# Patient Record
Sex: Female | Born: 1950
Health system: Southern US, Community
[De-identification: ages and names within clinical notes are randomized; demographics above are authoritative.]

## PROBLEM LIST (undated history)

## (undated) DIAGNOSIS — E669 Obesity, unspecified: Secondary | ICD-10-CM

## (undated) DIAGNOSIS — G709 Myoneural disorder, unspecified: Secondary | ICD-10-CM

## (undated) DIAGNOSIS — M1990 Unspecified osteoarthritis, unspecified site: Secondary | ICD-10-CM

## (undated) DIAGNOSIS — G459 Transient cerebral ischemic attack, unspecified: Secondary | ICD-10-CM

## (undated) DIAGNOSIS — E785 Hyperlipidemia, unspecified: Secondary | ICD-10-CM

## (undated) DIAGNOSIS — G43909 Migraine, unspecified, not intractable, without status migrainosus: Secondary | ICD-10-CM

## (undated) DIAGNOSIS — F419 Anxiety disorder, unspecified: Secondary | ICD-10-CM

## (undated) DIAGNOSIS — T7840XA Allergy, unspecified, initial encounter: Secondary | ICD-10-CM

## (undated) DIAGNOSIS — K5790 Diverticulosis of intestine, part unspecified, without perforation or abscess without bleeding: Secondary | ICD-10-CM

## (undated) DIAGNOSIS — M810 Age-related osteoporosis without current pathological fracture: Secondary | ICD-10-CM

## (undated) DIAGNOSIS — G4710 Hypersomnia, unspecified: Secondary | ICD-10-CM

## (undated) DIAGNOSIS — G35 Multiple sclerosis: Secondary | ICD-10-CM

## (undated) DIAGNOSIS — K5792 Diverticulitis of intestine, part unspecified, without perforation or abscess without bleeding: Secondary | ICD-10-CM

## (undated) HISTORY — PX: ABDOMINAL HYSTERECTOMY: SHX81

## (undated) HISTORY — DX: Diverticulosis of intestine, part unspecified, without perforation or abscess without bleeding: K57.90

## (undated) HISTORY — DX: Obesity, unspecified: E66.9

## (undated) HISTORY — DX: Diverticulitis of intestine, part unspecified, without perforation or abscess without bleeding: K57.92

## (undated) HISTORY — PX: OTHER SURGICAL HISTORY: SHX169

## (undated) HISTORY — PX: FRACTURE SURGERY: SHX138

## (undated) HISTORY — DX: Hyperlipidemia, unspecified: E78.5

## (undated) HISTORY — DX: Hypersomnia, unspecified: G47.10

## (undated) HISTORY — PX: TUBAL LIGATION: SHX77

## (undated) HISTORY — PX: WRIST SURGERY: SHX841

## (undated) HISTORY — DX: Unspecified osteoarthritis, unspecified site: M19.90

## (undated) HISTORY — DX: Allergy, unspecified, initial encounter: T78.40XA

## (undated) HISTORY — DX: Anxiety disorder, unspecified: F41.9

## (undated) HISTORY — DX: Age-related osteoporosis without current pathological fracture: M81.0

## (undated) HISTORY — DX: Myoneural disorder, unspecified: G70.9

## (undated) HISTORY — DX: Transient cerebral ischemic attack, unspecified: G45.9

## (undated) HISTORY — DX: Migraine, unspecified, not intractable, without status migrainosus: G43.909

---

## 1997-08-02 ENCOUNTER — Ambulatory Visit (HOSPITAL_COMMUNITY): Admission: RE | Admit: 1997-08-02 | Discharge: 1997-08-02 | Payer: Self-pay | Admitting: Family Medicine

## 1999-06-10 ENCOUNTER — Ambulatory Visit (HOSPITAL_COMMUNITY): Admission: RE | Admit: 1999-06-10 | Discharge: 1999-06-10 | Payer: Self-pay | Admitting: Neurology

## 1999-06-10 ENCOUNTER — Encounter: Payer: Self-pay | Admitting: Neurology

## 1999-07-27 ENCOUNTER — Encounter: Payer: Self-pay | Admitting: Neurology

## 1999-07-27 ENCOUNTER — Ambulatory Visit (HOSPITAL_COMMUNITY): Admission: RE | Admit: 1999-07-27 | Discharge: 1999-07-27 | Payer: Self-pay | Admitting: Family Medicine

## 1999-08-06 ENCOUNTER — Ambulatory Visit (HOSPITAL_COMMUNITY): Admission: RE | Admit: 1999-08-06 | Discharge: 1999-08-06 | Payer: Self-pay | Admitting: Neurology

## 1999-10-15 ENCOUNTER — Encounter: Admission: RE | Admit: 1999-10-15 | Discharge: 1999-10-15 | Payer: Self-pay | Admitting: Family Medicine

## 1999-10-15 ENCOUNTER — Encounter: Payer: Self-pay | Admitting: Family Medicine

## 1999-10-29 ENCOUNTER — Ambulatory Visit (HOSPITAL_COMMUNITY): Admission: RE | Admit: 1999-10-29 | Discharge: 1999-10-29 | Payer: Self-pay | Admitting: Neurology

## 1999-12-04 ENCOUNTER — Ambulatory Visit (HOSPITAL_COMMUNITY): Admission: RE | Admit: 1999-12-04 | Discharge: 1999-12-04 | Payer: Self-pay | Admitting: Neurology

## 2000-01-10 ENCOUNTER — Ambulatory Visit (HOSPITAL_COMMUNITY): Admission: RE | Admit: 2000-01-10 | Discharge: 2000-01-10 | Payer: Self-pay | Admitting: Neurology

## 2000-01-30 ENCOUNTER — Ambulatory Visit (HOSPITAL_BASED_OUTPATIENT_CLINIC_OR_DEPARTMENT_OTHER): Admission: RE | Admit: 2000-01-30 | Discharge: 2000-01-30 | Payer: Self-pay | Admitting: Internal Medicine

## 2000-02-01 ENCOUNTER — Ambulatory Visit (HOSPITAL_COMMUNITY): Admission: RE | Admit: 2000-02-01 | Discharge: 2000-02-01 | Payer: Self-pay | Admitting: Neurology

## 2000-07-23 ENCOUNTER — Encounter: Payer: Self-pay | Admitting: Neurology

## 2000-07-23 ENCOUNTER — Ambulatory Visit (HOSPITAL_COMMUNITY): Admission: RE | Admit: 2000-07-23 | Discharge: 2000-07-23 | Payer: Self-pay | Admitting: Neurology

## 2000-08-09 ENCOUNTER — Encounter: Payer: Self-pay | Admitting: Neurology

## 2000-08-09 ENCOUNTER — Ambulatory Visit (HOSPITAL_COMMUNITY): Admission: RE | Admit: 2000-08-09 | Discharge: 2000-08-09 | Payer: Self-pay | Admitting: Neurology

## 2000-10-27 ENCOUNTER — Encounter: Payer: Self-pay | Admitting: Family Medicine

## 2000-10-27 ENCOUNTER — Encounter: Admission: RE | Admit: 2000-10-27 | Discharge: 2000-10-27 | Payer: Self-pay | Admitting: Family Medicine

## 2001-03-06 ENCOUNTER — Encounter: Payer: Self-pay | Admitting: Hematology & Oncology

## 2001-03-06 ENCOUNTER — Encounter (HOSPITAL_COMMUNITY): Admission: RE | Admit: 2001-03-06 | Discharge: 2001-06-04 | Payer: Self-pay | Admitting: Hematology & Oncology

## 2001-10-06 ENCOUNTER — Encounter: Payer: Self-pay | Admitting: Neurology

## 2001-10-06 ENCOUNTER — Ambulatory Visit (HOSPITAL_COMMUNITY): Admission: RE | Admit: 2001-10-06 | Discharge: 2001-10-06 | Payer: Self-pay | Admitting: Neurology

## 2002-03-22 ENCOUNTER — Encounter: Admission: RE | Admit: 2002-03-22 | Discharge: 2002-03-22 | Payer: Self-pay | Admitting: Family Medicine

## 2002-03-22 ENCOUNTER — Encounter: Payer: Self-pay | Admitting: Family Medicine

## 2003-03-25 ENCOUNTER — Encounter: Admission: RE | Admit: 2003-03-25 | Discharge: 2003-03-25 | Payer: Self-pay | Admitting: Family Medicine

## 2003-07-11 ENCOUNTER — Ambulatory Visit (HOSPITAL_COMMUNITY): Admission: RE | Admit: 2003-07-11 | Discharge: 2003-07-11 | Payer: Self-pay | Admitting: Neurology

## 2004-03-29 ENCOUNTER — Ambulatory Visit: Payer: Self-pay | Admitting: Oncology

## 2004-05-22 ENCOUNTER — Encounter: Admission: RE | Admit: 2004-05-22 | Discharge: 2004-05-22 | Payer: Self-pay | Admitting: Family Medicine

## 2005-05-23 ENCOUNTER — Encounter: Admission: RE | Admit: 2005-05-23 | Discharge: 2005-05-23 | Payer: Self-pay | Admitting: Family Medicine

## 2005-09-28 ENCOUNTER — Emergency Department (HOSPITAL_COMMUNITY): Admission: EM | Admit: 2005-09-28 | Discharge: 2005-09-28 | Payer: Self-pay | Admitting: Emergency Medicine

## 2005-12-10 ENCOUNTER — Ambulatory Visit: Admission: RE | Admit: 2005-12-10 | Discharge: 2005-12-10 | Payer: Self-pay | Admitting: Neurology

## 2006-06-05 ENCOUNTER — Encounter: Admission: RE | Admit: 2006-06-05 | Discharge: 2006-06-05 | Payer: Self-pay | Admitting: Family Medicine

## 2006-06-20 ENCOUNTER — Encounter: Admission: RE | Admit: 2006-06-20 | Discharge: 2006-06-20 | Payer: Self-pay | Admitting: Family Medicine

## 2006-06-24 ENCOUNTER — Encounter: Admission: RE | Admit: 2006-06-24 | Discharge: 2006-06-24 | Payer: Self-pay | Admitting: Family Medicine

## 2006-11-28 ENCOUNTER — Ambulatory Visit (HOSPITAL_COMMUNITY): Admission: RE | Admit: 2006-11-28 | Discharge: 2006-11-28 | Payer: Self-pay | Admitting: Gastroenterology

## 2006-12-02 ENCOUNTER — Encounter: Admission: RE | Admit: 2006-12-02 | Discharge: 2006-12-02 | Payer: Self-pay | Admitting: Family Medicine

## 2007-05-26 ENCOUNTER — Encounter: Admission: RE | Admit: 2007-05-26 | Discharge: 2007-05-26 | Payer: Self-pay | Admitting: Family Medicine

## 2007-06-03 ENCOUNTER — Emergency Department (HOSPITAL_COMMUNITY): Admission: EM | Admit: 2007-06-03 | Discharge: 2007-06-03 | Payer: Self-pay | Admitting: Family Medicine

## 2007-06-16 ENCOUNTER — Encounter: Admission: RE | Admit: 2007-06-16 | Discharge: 2007-06-16 | Payer: Self-pay | Admitting: Rheumatology

## 2007-06-26 ENCOUNTER — Encounter: Admission: RE | Admit: 2007-06-26 | Discharge: 2007-06-26 | Payer: Self-pay | Admitting: Family Medicine

## 2007-07-07 ENCOUNTER — Encounter: Admission: RE | Admit: 2007-07-07 | Discharge: 2007-07-07 | Payer: Self-pay | Admitting: Rheumatology

## 2008-06-30 ENCOUNTER — Encounter: Admission: RE | Admit: 2008-06-30 | Discharge: 2008-06-30 | Payer: Self-pay | Admitting: Family Medicine

## 2008-07-26 ENCOUNTER — Encounter: Admission: RE | Admit: 2008-07-26 | Discharge: 2008-09-08 | Payer: Self-pay | Admitting: Orthopedic Surgery

## 2008-11-03 ENCOUNTER — Emergency Department (HOSPITAL_BASED_OUTPATIENT_CLINIC_OR_DEPARTMENT_OTHER): Admission: EM | Admit: 2008-11-03 | Discharge: 2008-11-03 | Payer: Self-pay | Admitting: Emergency Medicine

## 2008-11-23 ENCOUNTER — Emergency Department (HOSPITAL_COMMUNITY): Admission: EM | Admit: 2008-11-23 | Discharge: 2008-11-23 | Payer: Self-pay | Admitting: Emergency Medicine

## 2009-02-01 ENCOUNTER — Encounter (INDEPENDENT_AMBULATORY_CARE_PROVIDER_SITE_OTHER): Payer: Self-pay | Admitting: General Surgery

## 2009-02-01 ENCOUNTER — Ambulatory Visit: Admission: RE | Admit: 2009-02-01 | Discharge: 2009-02-01 | Payer: Self-pay | Admitting: General Surgery

## 2009-02-01 ENCOUNTER — Ambulatory Visit: Payer: Self-pay | Admitting: Surgery

## 2009-02-01 ENCOUNTER — Emergency Department (HOSPITAL_COMMUNITY): Admission: EM | Admit: 2009-02-01 | Discharge: 2009-02-01 | Payer: Self-pay | Admitting: Family Medicine

## 2009-06-14 ENCOUNTER — Encounter: Payer: Self-pay | Admitting: Cardiology

## 2009-08-28 ENCOUNTER — Encounter: Admission: RE | Admit: 2009-08-28 | Discharge: 2009-08-28 | Payer: Self-pay | Admitting: Family Medicine

## 2009-11-14 ENCOUNTER — Emergency Department (HOSPITAL_COMMUNITY): Admission: EM | Admit: 2009-11-14 | Discharge: 2009-11-14 | Payer: Self-pay | Admitting: Family Medicine

## 2009-11-23 ENCOUNTER — Encounter: Payer: Self-pay | Admitting: Cardiology

## 2009-12-01 ENCOUNTER — Encounter: Admission: RE | Admit: 2009-12-01 | Discharge: 2009-12-01 | Payer: Self-pay | Admitting: Family Medicine

## 2010-01-05 DIAGNOSIS — R0602 Shortness of breath: Secondary | ICD-10-CM

## 2010-01-08 ENCOUNTER — Ambulatory Visit: Payer: Self-pay | Admitting: Cardiology

## 2010-01-08 DIAGNOSIS — E785 Hyperlipidemia, unspecified: Secondary | ICD-10-CM

## 2010-01-08 DIAGNOSIS — G35 Multiple sclerosis: Secondary | ICD-10-CM

## 2010-01-12 ENCOUNTER — Ambulatory Visit: Payer: Self-pay | Admitting: Cardiology

## 2010-01-18 ENCOUNTER — Encounter
Admission: RE | Admit: 2010-01-18 | Discharge: 2010-02-02 | Payer: Self-pay | Source: Home / Self Care | Admitting: Orthopedic Surgery

## 2010-01-30 ENCOUNTER — Encounter (INDEPENDENT_AMBULATORY_CARE_PROVIDER_SITE_OTHER): Payer: Self-pay | Admitting: *Deleted

## 2010-01-30 ENCOUNTER — Ambulatory Visit: Payer: Self-pay

## 2010-01-30 ENCOUNTER — Ambulatory Visit: Payer: Self-pay | Admitting: Cardiology

## 2010-01-30 ENCOUNTER — Ambulatory Visit (HOSPITAL_COMMUNITY): Admission: RE | Admit: 2010-01-30 | Discharge: 2010-01-30 | Payer: Self-pay | Admitting: Cardiology

## 2010-01-30 ENCOUNTER — Ambulatory Visit (HOSPITAL_COMMUNITY)
Admission: RE | Admit: 2010-01-30 | Discharge: 2010-01-30 | Payer: Self-pay | Source: Home / Self Care | Admitting: Cardiology

## 2010-01-30 ENCOUNTER — Encounter: Payer: Self-pay | Admitting: Cardiology

## 2010-02-08 LAB — CONVERTED CEMR LAB
Albumin: 4.2 g/dL (ref 3.5–5.2)
Alkaline Phosphatase: 91 units/L (ref 39–117)
BUN: 16 mg/dL (ref 6–23)
Basophils Absolute: 0 10*3/uL (ref 0.0–0.1)
Bilirubin, Direct: 0.1 mg/dL (ref 0.0–0.3)
Calcium: 9.3 mg/dL (ref 8.4–10.5)
Creatinine, Ser: 0.9 mg/dL (ref 0.4–1.2)
Eosinophils Relative: 1.8 % (ref 0.0–5.0)
GFR calc non Af Amer: 65.92 mL/min (ref >60)
Glucose, Bld: 82 mg/dL (ref 70–99)
HDL: 53 mg/dL (ref >39.00)
Lymphs Abs: 1.5 10*3/uL (ref 0.7–4.0)
Monocytes Absolute: 0.3 10*3/uL (ref 0.1–1.0)
Monocytes Relative: 5.4 % (ref 3.0–12.0)
Neutrophils Relative %: 65.4 % (ref 43.0–77.0)
Platelets: 231 10*3/uL (ref 150.0–400.0)
Pro B Natriuretic peptide (BNP): 19.8 pg/mL (ref 0.0–100.0)
RDW: 13.8 % (ref 11.5–14.6)
TSH: 1.81 microintl units/mL (ref 0.35–5.50)
Total Protein: 7.1 g/dL (ref 6.0–8.3)
Triglycerides: 180 mg/dL — ABNORMAL HIGH (ref 0.0–149.0)
VLDL: 36 mg/dL (ref 0.0–40.0)
WBC: 5.5 10*3/uL (ref 4.5–10.5)

## 2010-03-06 ENCOUNTER — Ambulatory Visit: Payer: Self-pay | Admitting: Vascular Surgery

## 2010-05-20 ENCOUNTER — Encounter: Payer: Self-pay | Admitting: Family Medicine

## 2010-05-29 NOTE — Letter (Signed)
Summary: Outpatient Coinsurance Notice  Outpatient Coinsurance Notice   Imported By: Marylou Mccoy 02/07/2010 13:32:22  _____________________________________________________________________  External Attachment:    Type:   Image     Comment:   External Document

## 2010-05-29 NOTE — Assessment & Plan Note (Signed)
Summary: np6/dx:SOB/pt has UMR/lg   Visit Type:  Initial Consult Primary Provider:  Nadyne Coombes  CC:  sob.  History of Present Illness: The patient is 60 years old and is referred for evaluation of shortness of breath by Dr. Corliss Skains.  She has no prior history of known heart disease. She says over the last 3 months she has noticed more shortness of breath with exertion. She notices this when she plays with her grandchildren are new and she has walks across the room. She's had no associated cough, sweating, nausea, or fever. She used to be a smoker but quit 6 years ago. She has had no associated chest pain with her shortness of breath and she has had no palpitations.  Her risk profile for vascular disease include high cholesterol which has not been treated with medication and a positive family history for coronary disease with a brother who had a heart attack at age 77. This was a half-brother.  Current Medications (verified): 1)  Aspirin 81 Mg Tbec (Aspirin) .... Take One Tablet By Mouth Daily 2)  Ibprofen .... Daily As Needed 3)  Tramadol Hcl 50 Mg Tabs (Tramadol Hcl) .... 3-4 Tabs Qd 4)  Copaxone 20 Mg/ml Kit (Glatiramer Acetate) .Marland Kitchen.. 1 Injection A Day For Ms 5)  Zofran 4 Mg Tabs (Ondansetron Hcl) .... As Needed 6)  Enablex 7.5 Mg Xr24h-Tab (Darifenacin Hydrobromide) .Marland Kitchen.. 1tab Qd 7)  Voltaren Gel .... Daily  Allergies (verified): 1)  ! Sulfa 2)  ! Macrobid 3)  ! * Nitrofurantion 4)  ! Truman Hayward  Past History:  Past Medical History: Dyspnea Elevated cholesterol multiple sclerosis followed by Dr. Marton Redwood.  Surgery: Hysterectomy, wrist surgery, wrist and arm surgery.  Family History: Reviewed history from 01/05/2010 and no changes required. Family History of Cancer Mother died at 62 of lung disease Father died at age 96 of emphysema Brother died at age 64 of bone cancer Half-brother died at age 73 of heart attack  Social History: Reviewed history from 01/05/2010 and no  changes required. Tobacco Use - Former.  Alcohol Use - no Regular Exercise - no Married  Full Time  Review of Systems       She has symptoms of fatigue which he relates to her multiple sclerosis.  Vital Signs:  Patient profile:   60 year old female Height:      65 inches Weight:      168 pounds BMI:     28.06 Pulse rate:   70 / minute BP sitting:   118 / 80  (left arm) Cuff size:   regular  Vitals Entered By: Burnett Kanaris, CNA (January 08, 2010 3:04 PM)  Physical Exam  Additional Exam:  Gen. Well-nourished, in no distress   Neck: No JVD, thyroid not enlarged, no carotid bruits Lungs: No tachypnea, clear without rales, rhonchi or wheezes Cardiovascular: Rhythm regular, PMI not displaced,  heart sounds  normal, no murmurs or gallops, no peripheral edema, pulses normal in all 4 extremities. Abdomen: BS normal, abdomen soft and non-tender without masses or organomegaly, no hepatosplenomegaly. MS: No deformities, no cyanosis or clubbing   Neuro:  No focal sns   Skin:  no lesions    Impression & Recommendations:  Problem # 1:  DYSPNEA (ICD-786.05) The etiology of the shortness of breath is not clear. Her cardiac examination and ECG are normal. Her lung examination is normal. She does have a moderate post of risk for vascular disease with a brother who had a heart attack at  age 61 and a history of elevated cholesterol.  We will plan to evaluate her further with a chest x-ray, an echocardiogram, a stress echocardiogram, and laboratory work. If we don't get any indication of what the problem is from these tests we may consider pulmonary function tests. Her updated medication list for this problem includes:    Aspirin 81 Mg Tbec (Aspirin) .Marland Kitchen... Take one tablet by mouth daily  Orders: EKG w/ Interpretation (93000) T-2 View CXR (71020TC) Echocardiogram (Echo) Stress Echo (Stress Echo)  Problem # 2:  HYPERLIPIDEMIA-MIXED (ICD-272.4) We will check a lipid profile  fasting.  Problem # 3:  MULTIPLE SCLEROSIS (ICD-340) This is followed by Dr. Marton Redwood and has limited her ability to exercise. Her symptoms may be related to deconditioning which may be partially secondary to her multiple sclerosis.  Patient Instructions: 1)  Your physician recommends that you schedule a follow-up appointment in: PENDING TEST RESULTS 2)  IF ALL TESTS BELOW ARE NEGATIVE  MAY NEED  PFT 3)  Your physician recommends that you return for lab work ZO:XWRU BNP CBC LIPID LIVER TSH 4)  Your physician recommends that you continue on your current medications as directed. Please refer to the Current Medication list given to you today. 5)  A chest x-ray takes a picture of the organs and structures inside the chest, including the heart, lungs, and blood vessels. This test can show several things, including, whether the heart is enlarged; whether fluid is building up in the lungs; and whether pacemaker / defibrillator leads are still in place. 6)  Your physician has requested that you have a stress echocardiogram. For further information please visit [email or web address]  Please follow instruction sheet as given. 7)  Your physician has requested that you have an echocardiogram.  Echocardiography is a painless test that uses sound waves to create images of your heart. It provides your doctor with information about the size and shape of your heart and how well your heart's chambers and valves are working.  This procedure takes approximately one hour. There are no restrictions for this procedure.

## 2010-05-29 NOTE — Letter (Signed)
Summary: [facility]  [facility]   Imported By: Marylou Mccoy 01/08/2010 12:13:27  _____________________________________________________________________  External Attachment:    Type:   Image     Comment:   External Document

## 2010-05-29 NOTE — Letter (Signed)
Summary: [facility]  [facility]   Imported By: Marylou Mccoy 01/08/2010 12:16:20  _____________________________________________________________________  External Attachment:    Type:   Image     Comment:   External Document

## 2010-05-29 NOTE — Letter (Signed)
Summary: [facility] Office Note   [facility] Office Note   Imported By: Roderic Ovens 01/11/2010 13:15:55  _____________________________________________________________________  External Attachment:    Type:   Image     Comment:   External Document

## 2010-05-29 NOTE — Letter (Signed)
Summary: [facility] Rheumatology Physical Exam Note   [facility] Rheumatology Physical Exam Note   Imported By: Roderic Ovens 01/11/2010 13:17:13  _____________________________________________________________________  External Attachment:    Type:   Image     Comment:   External Document

## 2010-05-29 NOTE — Miscellaneous (Signed)
  Clinical Lists Changes  Medications: Added new medication of FOSAMAX 70 MG TABS (ALENDRONATE SODIUM) 1 TAB Q WEELY Added new medication of VITAMIN C 500 MG  TABS (ASCORBIC ACID) DAILY Added new medication of VITAMIN D 1000 UNIT  TABS (CHOLECALCIFEROL) DAILY Added new medication of * VITAMIN A DAILY Added new medication of ASPIRIN 81 MG TBEC (ASPIRIN) Take one tablet by mouth daily Added new medication of * IBPROFEN DAILY as needed Added new medication of TRAMADOL HCL 50 MG TABS (TRAMADOL HCL) as needed      Allergies: 1)  ! Sulfa

## 2010-09-11 NOTE — Consult Note (Signed)
NEW PATIENT CONSULTATION   Katie Maldonado, Katie Maldonado  DOB:  1950/08/04                                       03/06/2010  ZOXWR#:[id number]   The patient presents today for evaluation of swelling in her right foot.  She is a healthy 60 year old white female with several injuries to her  right foot.  She had a laceration at the junction of the dorsum of her  foot and ankle in July of 2010 and this was initially sutured in the  emergency department and had poor healing and was seen by [facility] general surgeons for several months with Silvadene treatment  with eventual healing.  Since this injury she has had swelling in her  right foot.  She subsequently had a fracture approximately 2 months ago.  She has had good healing of this but continues to have swelling in her  right foot and is seen for further evaluation.  On reviewing her old  films she did have a right leg venous duplex in October of 2010 showing  no evidence of deep or superficial thrombosis in her right venous  system.  Her past medical history is significant for hypertension,  multiple sclerosis and has history of hysterectomy and fracture in her  wrist.   SOCIAL HISTORY:  She is married with two children.  She works in  Clinical biochemist.  She does not smoke, having quit 20 years ago and has  1 or 2 alcohol drinks per week.   FAMILY HISTORY:  Positive for myocardial infarction and death at 56 from  a half brother.   REVIEW OF SYSTEMS:  Otherwise negative review of systems.  She reports  some weight gain.  Her height is 5 feet 5 inches tall.  CARDIAC:  Positive for shortness of breath.  GI:  Negative.  URINARY:  Positive for frequent urination.  MUSCULOSKELETAL:  Positive for arthritis and joint pain.  Review of systems otherwise negative except as in HPI.   PHYSICAL EXAMINATION:  General:  A well-developed, well-nourished white  female appearing stated age in no acute distress.  Vital signs:   Blood  pressure is 113/77, heart rate is 87, respirations 16.  HEENT:  Normal.  Chest:  Clear bilaterally with no rales, rhonchi or wheezes.  Heart:  Regular rate and rhythm.  Abdomen:  Soft, nontender.  No masses noted.  She does have no carotid bruits bilaterally.  She has 2+ radial, 2+  popliteal pulses.  She has 1 to 2+ posterior tibial pulses bilaterally.  I do not palpate dorsalis pedis pulses bilaterally.  Musculoskeletal:  Shows no major deformities or cyanosis.  Neurological:  No focal  weakness or paresthesias.  Skin:  Without ulcers or rashes.  She does  have slight swelling in her right foot as compared to her left.   I discussed all this at length with the patient.  I do not see any  evidence of arterial or venous pathology to explain this.  I suspect  this is related to the injury and secondary healing of the laceration of  the dorsum of her foot.  I explained that I suspect this will be a  chronic issue with no specific treatment.  I did explain that  compression garments in all likelihood would improve the swelling but  she is not interested in using compression garments as she  feels this is  not severe enough to warrant this.  I explained that if it does become  progressive that this would be her best option for controlling the  swelling.  She will follow up with [place] on an as-needed basis.     Larina Earthly, M.D.  Electronically Signed   TFE/MEDQ  D:  03/06/2010  T:  03/06/2010  Job:  [id number]   cc:   Jonny Ruiz L. Rendall, M.D.  Genene Churn. Love, M.D.

## 2010-09-11 NOTE — Op Note (Signed)
NAME:  Katie Maldonado, Katie Maldonado         ACCOUNT NO.:  [id number]   MEDICAL RECORD NO.:  [id number]          PATIENT TYPE:  AMB   LOCATION:  ENDO                         FACILITY:  [facility]   PHYSICIAN:  Anselmo Rod, M.D.  DATE OF BIRTH:  1950/10/18   DATE OF PROCEDURE:  11/28/2006  DATE OF DISCHARGE:                               OPERATIVE REPORT   PROCEDURE PERFORMED:  Screening colonoscopy.   ENDOSCOPIST:  Dr. Anselmo Rod.   INSTRUMENT USED:  Pentax video colonoscope.   INDICATIONS FOR PROCEDURE:  A 60 year old, white female undergoing a  screening colonoscopy to rule out colonic polyps, masses, etc.   PREPROCEDURE PREPARATION:  Informed consent was procured from the  patient.  The patient was fasted for 8 hours prior to the procedure and  prepped with 32 OsmoPrep pills the night prior to the procedure. The  risks and benefits of the procedure including a 10% miss rate of cancer  and polyp were discussed with the patient as well.   PREPROCEDURE PHYSICAL:  The patient had stable vital signs. Neck supple.  Chest clear to auscultation. S1, S2 regular.  Abdomen soft with normal  bowel sounds.   DESCRIPTION OF PROCEDURE:  The patient was placed in the left lateral  decubitus position and sedated with 100 mcg of Fentanyl and 10 mg of  Versed given intravenously in slow incremental doses. Once the patient  was adequately sedated and maintained on low-flow oxygen and continuous  cardiac monitoring, the Pentax video colonoscope was advanced from the  rectum to the cecum.  The patient's position was changed from the left  lateral to supine position with gentle application of abdominal pressure  to reach the cecum.  There was some adherent stool in the cecum,  multiple washes were done.  The appendiceal orifice and the ileocecal  valve were visualized and photographed. The terminal ileum appeared  healthy and without lesions.  No masses or polyps were identified.  The  patient  tolerated the procedure well without immediate complications.   IMPRESSION:  1. A few sigmoid diverticula, otherwise normal exam to terminal ileum.  2. Some residual stool in the right colon. Multiple washes done.  3. No masses or polyps seen.   RECOMMENDATIONS:  1. Continue a high fiber diet with liberal fluid intake.  2. Repeat colonoscopy in the next 10 years unless the patient has any      abnormal symptoms in the interim.  3. Brochures on diverticulosis have been given to the patient for      education.  4. Outpatient follow-up as need arises in the future.      Anselmo Rod, M.D.  Electronically Signed     JNM/MEDQ  D:  11/28/2006  T:  11/28/2006  Job:  [id number]   cc:   Gloriajean Dell. Andrey Campanile, M.D.  Fax: [phone]

## 2010-09-12 ENCOUNTER — Other Ambulatory Visit: Payer: Self-pay | Admitting: Family Medicine

## 2010-09-12 DIAGNOSIS — R234 Changes in skin texture: Secondary | ICD-10-CM

## 2010-09-14 NOTE — Procedures (Signed)
[place]  Patient:    Katie Maldonado, Katie Maldonado                MRN: [id number] Proc. Date: 08/06/99 Adm. Date:  [id number] Attending:  Erich Montane                           Procedure Report  DATE OF BIRTH:  [birth year]  CLINICAL INFORMATION:  The patient is being evaluated for abnormal signal at the CT level at the spinal cord.  PROCEDURE PERFORMED:  Lumbar puncture.  DESCRIPTION OF PROCEDURE:  The patient was prepped and draped in the left lateral decubitus position with Betadine and 1% Xylocaine.  The L4-L5 interspace was entered without difficulty.  Opening pressure was 150 mmH2O and clear, colorless CSF was obtained and sent for studies to include VDRL, cell count and diff, protein and glucose, angiotensive converting enzyme and IgG and oligoclonal IgG.  The patient tolerated the procedure well. DD:  08/06/99 TD:  08/07/99 Job: [id number]

## 2010-09-17 ENCOUNTER — Ambulatory Visit
Admission: RE | Admit: 2010-09-17 | Discharge: 2010-09-17 | Disposition: A | Discharge status: Home / Self Care | Payer: 59 | Source: Ambulatory Visit | Attending: Family Medicine | Admitting: Family Medicine

## 2010-09-17 DIAGNOSIS — R234 Changes in skin texture: Secondary | ICD-10-CM

## 2010-12-22 ENCOUNTER — Encounter: Payer: Self-pay | Admitting: *Deleted

## 2010-12-22 ENCOUNTER — Emergency Department (HOSPITAL_BASED_OUTPATIENT_CLINIC_OR_DEPARTMENT_OTHER)
Admission: EM | Admit: 2010-12-22 | Discharge: 2010-12-22 | Disposition: A | Discharge status: Home / Self Care | Payer: 59 | Attending: Emergency Medicine | Admitting: Emergency Medicine

## 2010-12-22 DIAGNOSIS — R1110 Vomiting, unspecified: Secondary | ICD-10-CM | POA: Insufficient documentation

## 2010-12-22 DIAGNOSIS — T3795XA Adverse effect of unspecified systemic anti-infective and antiparasitic, initial encounter: Secondary | ICD-10-CM | POA: Insufficient documentation

## 2010-12-22 DIAGNOSIS — R0602 Shortness of breath: Secondary | ICD-10-CM | POA: Insufficient documentation

## 2010-12-22 DIAGNOSIS — G35 Multiple sclerosis: Secondary | ICD-10-CM | POA: Insufficient documentation

## 2010-12-22 DIAGNOSIS — T50905A Adverse effect of unspecified drugs, medicaments and biological substances, initial encounter: Secondary | ICD-10-CM

## 2010-12-22 HISTORY — DX: Multiple sclerosis: G35

## 2010-12-22 LAB — LIPASE, BLOOD: Lipase: 43 U/L (ref 11–59)

## 2010-12-22 LAB — COMPREHENSIVE METABOLIC PANEL
ALT: 20 U/L (ref 0–35)
AST: 23 U/L (ref 0–37)
Albumin: 3.8 g/dL (ref 3.5–5.2)
Calcium: 9.8 mg/dL (ref 8.4–10.5)
Creatinine, Ser: 0.7 mg/dL (ref 0.50–1.10)
GFR calc non Af Amer: >60 mL/min (ref >60)
Sodium: 139 mEq/L (ref 135–145)
Total Protein: 7.6 g/dL (ref 6.0–8.3)

## 2010-12-22 LAB — DIFFERENTIAL
Basophils Absolute: 0 10*3/uL (ref 0.0–0.1)
Basophils Relative: 0 % (ref 0–1)
Eosinophils Absolute: 0.1 10*3/uL (ref 0.0–0.7)
Eosinophils Relative: 1 % (ref 0–5)
Monocytes Absolute: 0.3 10*3/uL (ref 0.1–1.0)

## 2010-12-22 LAB — CBC
HCT: 39.5 % (ref 36.0–46.0)
MCHC: 33.4 g/dL (ref 30.0–36.0)
MCV: 88.6 fL (ref 78.0–100.0)
Platelets: 245 10*3/uL (ref 150–400)
RDW: 12.5 % (ref 11.5–15.5)
WBC: 9.7 10*3/uL (ref 4.0–10.5)

## 2010-12-22 LAB — URINALYSIS, ROUTINE W REFLEX MICROSCOPIC
Bilirubin Urine: NEGATIVE
Glucose, UA: NEGATIVE mg/dL
Hgb urine dipstick: NEGATIVE
Ketones, ur: NEGATIVE mg/dL
Nitrite: NEGATIVE
Protein, ur: NEGATIVE mg/dL
Specific Gravity, Urine: 1.014 (ref 1.005–1.030)
Urobilinogen, UA: 0.2 mg/dL (ref 0.0–1.0)
pH: 5.5 (ref 5.0–8.0)

## 2010-12-22 LAB — URINE MICROSCOPIC-ADD ON

## 2010-12-22 MED ORDER — ONDANSETRON 8 MG PO TBDP
8.0000 mg | ORAL_TABLET | Freq: Once | ORAL | Status: AC
  Administered 2010-12-22: 8 mg via ORAL

## 2010-12-22 MED ORDER — FAMOTIDINE IN NACL 20-0.9 MG/50ML-% IV SOLN
20.0000 mg | Freq: Once | INTRAVENOUS | Status: AC
  Administered 2010-12-22: 20 mg via INTRAVENOUS
  Filled 2010-12-22: qty 50

## 2010-12-22 MED ORDER — CIPROFLOXACIN IN D5W 400 MG/200ML IV SOLN
400.0000 mg | Freq: Once | INTRAVENOUS | Status: AC
  Administered 2010-12-22: 400 mg via INTRAVENOUS
  Filled 2010-12-22: qty 200

## 2010-12-22 MED ORDER — CIPROFLOXACIN HCL 500 MG PO TABS: 500.0000 mg | ORAL_TABLET | Freq: Two times a day (BID) | ORAL | Status: AC

## 2010-12-22 MED ORDER — SODIUM CHLORIDE 0.9 % IV BOLUS (SEPSIS)
1000.0000 mL | Freq: Once | INTRAVENOUS | Status: AC
  Administered 2010-12-22: 1000 mL via INTRAVENOUS

## 2010-12-22 MED ORDER — DIPHENHYDRAMINE HCL 50 MG/ML IJ SOLN
25.0000 mg | Freq: Once | INTRAMUSCULAR | Status: AC
  Administered 2010-12-22: 25 mg via INTRAVENOUS
  Filled 2010-12-22: qty 1

## 2010-12-22 MED ORDER — ONDANSETRON 8 MG PO TBDP
ORAL_TABLET | ORAL | Status: AC
  Administered 2010-12-22: 8 mg via ORAL
  Filled 2010-12-22: qty 1

## 2010-12-22 MED ORDER — DEXTROSE 5 % IV SOLN: 1.0000 g | Freq: Once | INTRAVENOUS | Status: DC

## 2010-12-22 MED ORDER — ONDANSETRON HCL 4 MG/2ML IJ SOLN: 4.0000 mg | Freq: Once | INTRAMUSCULAR | Status: DC

## 2010-12-22 MED ORDER — PROMETHAZINE HCL 25 MG PO TABS: 25.0000 mg | ORAL_TABLET | Freq: Four times a day (QID) | ORAL | Status: AC | PRN

## 2010-12-22 NOTE — ED Notes (Signed)
Pt started microbid this evening for a bladder infection and began having SOB around 12:30 this morning. States she feel like her tongue is swollen and she is having pain "all over".

## 2010-12-22 NOTE — ED Provider Notes (Signed)
History     CSN: [id number] Arrival date & time: 12/22/2010  1:10 AM  Chief Complaint  Patient presents with  . Shortness of Breath   Patient is a 60 y.o. female presenting with vomiting. The history is provided by the patient.  Emesis  This is a new problem. The current episode started 1 to 2 hours ago. The problem occurs 2 to 4 times per day. The problem has not changed since onset.The emesis has an appearance of stomach contents. There has been no fever. Pertinent negatives include no abdominal pain, no chills, no diarrhea, no fever and no headaches. Associated symptoms comments: Feels uncomfortable and short of breath after taking her first dose of macrobid tonight for a UTI. Shortly after taking thios medication she developed nasuea and vomiting. No associated ABD pain. No F/C. PT worried that she may be allergic to macrobid. No rash or itching. No tongue, lip or throat swelling and her breathing is now better.  She did not take anything for her symptoms. She has known allergy to Sulfa and cefdinir - her allergy to cefdinir is a similar reaction with N/V.    Past Medical History  Diagnosis Date  . Multiple sclerosis     History reviewed. No pertinent past surgical history.  No family history on file.  History  Substance Use Topics  . Smoking status: Never Smoker   . Smokeless tobacco: Not on file  . Alcohol Use: No    OB History    Grav Para Term Preterm Abortions TAB SAB Ect Mult Living                  Review of Systems  Constitutional: Negative for fever and chills.  HENT: Negative for neck pain and neck stiffness.   Eyes: Negative for pain.  Respiratory: Negative for shortness of breath.   Cardiovascular: Negative for chest pain and palpitations.  Gastrointestinal: Positive for vomiting. Negative for abdominal pain and diarrhea.  Genitourinary: Negative for dysuria.  Musculoskeletal: Negative for back pain.  Skin: Negative for rash.  Neurological: Negative for  headaches.  Psychiatric/Behavioral:       Anxiety  All other systems reviewed and are negative.    Physical Exam  BP 116/68  Pulse 89  Temp(Src) 97.5 F (36.4 C) (Oral)  Resp 20  Ht 5\' 5"  (1.651 m)  Wt 160 lb (72.576 kg)  BMI 26.63 kg/m2  SpO2 100%  Physical Exam  Constitutional: She is oriented to person, place, and time. She appears well-developed and well-nourished.       Active emesis during initial exam  HENT:  Head: Normocephalic and atraumatic.  Eyes: Conjunctivae and EOM are normal. Pupils are equal, round, and reactive to light.  Neck: Full passive range of motion without pain. Neck supple. No thyromegaly present.       No meningismus  Cardiovascular: Normal rate, regular rhythm, S1 normal, S2 normal and intact distal pulses.   Pulmonary/Chest: Effort normal and breath sounds normal.  Abdominal: Soft. Bowel sounds are normal. There is no tenderness. There is no CVA tenderness.  Musculoskeletal: Normal range of motion.  Neurological: She is alert and oriented to person, place, and time. She has normal strength and normal reflexes. No cranial nerve deficit or sensory deficit. She displays a negative Romberg sign. GCS eye subscore is 4. GCS verbal subscore is 5. GCS motor subscore is 6.       Normal Gait  Skin: Skin is warm and dry. No rash noted. No cyanosis.  Nails show no clubbing.  Psychiatric: She has a normal mood and affect. Her speech is normal and behavior is normal.    ED Course  Procedures  MDM  Presentation c/w adverse drug reaction to macrobid. No rash/ hives/ anaphylaxis. Treated with IVFs, zofran and benadryl. Labs reviewed, serial exams and symptoms resolved in ED. At 3:02am is feeling much better. UA reviewed c/w UTI. CX sent by PCP- plan Cipro RX and wait on culture results. No hypotension or clinical Urospesis. No leukocytosis. No F/C.   Results for orders placed during the hospital encounter of 12/22/10 (from the past 24 hour(s))  CBC     Status:  Normal   Collection Time   12/22/10  1:32 AM      Component Value Range   WBC 9.7  4.0 - 10.5 (K/uL)   RBC 4.46  3.87 - 5.11 (MIL/uL)   Hemoglobin 13.2  12.0 - 15.0 (g/dL)   HCT 16.1  09.6 - 04.5 (%)   MCV 88.6  78.0 - 100.0 (fL)   MCH 29.6  26.0 - 34.0 (pg)   MCHC 33.4  30.0 - 36.0 (g/dL)   RDW 40.9  81.1 - 91.4 (%)   Platelets 245  150 - 400 (K/uL)  DIFFERENTIAL     Status: Abnormal   Collection Time   12/22/10  1:32 AM      Component Value Range   Neutrophils Relative 89 (*) 43 - 77 (%)   Neutro Abs 8.6 (*) 1.7 - 7.7 (K/uL)   Lymphocytes Relative 7 (*) 12 - 46 (%)   Lymphs Abs 0.7  0.7 - 4.0 (K/uL)   Monocytes Relative 3  3 - 12 (%)   Monocytes Absolute 0.3  0.1 - 1.0 (K/uL)   Eosinophils Relative 1  0 - 5 (%)   Eosinophils Absolute 0.1  0.0 - 0.7 (K/uL)   Basophils Relative 0  0 - 1 (%)   Basophils Absolute 0.0  0.0 - 0.1 (K/uL)  COMPREHENSIVE METABOLIC PANEL     Status: Abnormal   Collection Time   12/22/10  1:32 AM      Component Value Range   Sodium 139  135 - 145 (mEq/L)   Potassium 3.8  3.5 - 5.1 (mEq/L)   Chloride 102  96 - 112 (mEq/L)   CO2 26  19 - 32 (mEq/L)   Glucose, Bld 125 (*) 70 - 99 (mg/dL)   BUN 9  6 - 23 (mg/dL)   Creatinine, Ser 7.82  0.50 - 1.10 (mg/dL)   Calcium 9.8  8.4 - 95.6 (mg/dL)   Total Protein 7.6  6.0 - 8.3 (g/dL)   Albumin 3.8  3.5 - 5.2 (g/dL)   AST 23  0 - 37 (U/L)   ALT 20  0 - 35 (U/L)   Alkaline Phosphatase 102  39 - 117 (U/L)   Total Bilirubin 0.4  0.3 - 1.2 (mg/dL)   GFR calc non Af Amer >60  >60 (mL/min)   GFR calc Af Amer >60  >60 (mL/min)  LIPASE, BLOOD     Status: Normal   Collection Time   12/22/10  1:32 AM      Component Value Range   Lipase 43  11 - 59 (U/L)  URINALYSIS, ROUTINE W REFLEX MICROSCOPIC     Status: Abnormal   Collection Time   12/22/10  2:40 AM      Component Value Range   Color, Urine YELLOW  YELLOW    Appearance CLOUDY (*) CLEAR  Specific Gravity, Urine 1.014  1.005 - 1.030    pH 5.5  5.0 - 8.0     Glucose, UA NEGATIVE  NEGATIVE (mg/dL)   Hgb urine dipstick NEGATIVE  NEGATIVE    Bilirubin Urine NEGATIVE  NEGATIVE    Ketones, ur NEGATIVE  NEGATIVE (mg/dL)   Protein, ur NEGATIVE  NEGATIVE (mg/dL)   Urobilinogen, UA 0.2  0.0 - 1.0 (mg/dL)   Nitrite NEGATIVE  NEGATIVE    Leukocytes, UA LARGE (*) NEGATIVE   URINE MICROSCOPIC-ADD ON     Status: Abnormal   Collection Time   12/22/10  2:40 AM      Component Value Range   Squamous Epithelial / LPF MANY (*) RARE    WBC, UA 21-50  <3 (WBC/hpf)   RBC / HPF 0-2  <3 (RBC/hpf)   Bacteria, UA MANY (*) RARE          Katie Nielsen, MD 12/22/10 [phone]

## 2010-12-23 LAB — URINE CULTURE: Colony Count: NO GROWTH

## 2011-01-18 LAB — POCT URINALYSIS DIP (DEVICE)
Bilirubin Urine: NEGATIVE
Glucose, UA: NEGATIVE
Nitrite: NEGATIVE
Operator id: 239701
Specific Gravity, Urine: 1.015
Urobilinogen, UA: 1

## 2011-03-05 ENCOUNTER — Encounter: Payer: Self-pay | Admitting: Internal Medicine

## 2011-04-16 ENCOUNTER — Ambulatory Visit (INDEPENDENT_AMBULATORY_CARE_PROVIDER_SITE_OTHER): Payer: 59 | Admitting: Family Medicine

## 2011-04-16 DIAGNOSIS — R300 Dysuria: Secondary | ICD-10-CM

## 2011-04-18 ENCOUNTER — Encounter: Payer: Self-pay | Admitting: Pharmacist

## 2011-04-18 ENCOUNTER — Ambulatory Visit (INDEPENDENT_AMBULATORY_CARE_PROVIDER_SITE_OTHER): Payer: Self-pay | Admitting: Pharmacist

## 2011-04-18 DIAGNOSIS — G35 Multiple sclerosis: Secondary | ICD-10-CM

## 2011-04-18 DIAGNOSIS — M159 Polyosteoarthritis, unspecified: Secondary | ICD-10-CM

## 2011-04-18 DIAGNOSIS — R32 Unspecified urinary incontinence: Secondary | ICD-10-CM

## 2011-04-18 NOTE — Assessment & Plan Note (Signed)
Following medication review, no suggestions for change.  Complete medication list provided to patient.  Total time in face to face medication review: 30 minutes.  Patient seen with: Maudry Mayhew, Pharmacy Resident

## 2011-04-18 NOTE — Progress Notes (Signed)
  Subjective:    Patient ID: Katie Maldonado, female    DOB: 1950-05-12, 60 y.o.   MRN: [id number]  HPI  Patient arrives in good spirits for medication review.  Reports seeing Dr. Nadyne Coombes as primary care provider, Dr. Corliss Skains as rheumatologist, Dr. Avie Echevaria as neurologist [facility]).  Reports being diagnosed with multiple sclerosis for since January 2000 and states she is under acceptable level of control.     Review of Systems     Objective:   Physical Exam  BP 127/84  Pulse 81  Ht 5' 4.75" (1.645 m)  Wt 159 lb 14.4 oz (72.53 kg)  BMI 26.81 kg/m2       Assessment & Plan:  Following medication review, no suggestions for change.  Complete medication list provided to patient.  Total time in face to face medication review: 30 minutes.  Patient seen with: Maudry Mayhew, Pharmacy Resident

## 2011-04-19 NOTE — Progress Notes (Signed)
  Subjective:    Patient ID: Katie Maldonado, female    DOB: February 23, 1951, 60 y.o.   MRN: [id number]  HPI Reviewed and agree with Dr. Macky Lower management.    Review of Systems     Objective:   Physical Exam        Assessment & Plan:

## 2011-06-07 ENCOUNTER — Ambulatory Visit (INDEPENDENT_AMBULATORY_CARE_PROVIDER_SITE_OTHER): Payer: 59 | Admitting: Family Medicine

## 2011-06-07 VITALS — BP 126/79 | HR 60 | Temp 97.8°F | Resp 16 | Ht 65.0 in | Wt 160.4 lb

## 2011-06-07 DIAGNOSIS — R300 Dysuria: Secondary | ICD-10-CM

## 2011-06-07 DIAGNOSIS — N390 Urinary tract infection, site not specified: Secondary | ICD-10-CM

## 2011-06-07 LAB — POCT URINALYSIS DIPSTICK
Bilirubin, UA: NEGATIVE
Glucose, UA: NEGATIVE
Ketones, UA: NEGATIVE
Nitrite, UA: NEGATIVE
Protein, UA: NEGATIVE
Spec Grav, UA: 1.01
Urobilinogen, UA: 0.2
pH, UA: 6

## 2011-06-07 LAB — POCT UA - MICROSCOPIC ONLY
Bacteria, U Microscopic: NEGATIVE
Casts, Ur, LPF, POC: NEGATIVE
Crystals, Ur, HPF, POC: NEGATIVE
Epithelial cells, urine per micros: NEGATIVE
Mucus, UA: NEGATIVE
Yeast, UA: NEGATIVE

## 2011-06-07 MED ORDER — CIPROFLOXACIN HCL 500 MG PO TABS: 500.0000 mg | ORAL_TABLET | Freq: Two times a day (BID) | ORAL | Status: AC

## 2011-06-07 NOTE — Progress Notes (Signed)
This is a 61 yo woman who works for [employer] who presents with 2 days of dysuria and frequency.  No fever, nause/vomiting, back pain  Last UTI 10/12  Obj:  NAD No CVAT Results for orders placed during the hospital encounter of 12/22/10  CBC      Component Value Range   WBC 9.7  4.0 - 10.5 (K/uL)   RBC 4.46  3.87 - 5.11 (MIL/uL)   Hemoglobin 13.2  12.0 - 15.0 (g/dL)   HCT 16.1  09.6 - 04.5 (%)   MCV 88.6  78.0 - 100.0 (fL)   MCH 29.6  26.0 - 34.0 (pg)   MCHC 33.4  30.0 - 36.0 (g/dL)   RDW 40.9  81.1 - 91.4 (%)   Platelets 245  150 - 400 (K/uL)  DIFFERENTIAL      Component Value Range   Neutrophils Relative 89 (*) 43 - 77 (%)   Neutro Abs 8.6 (*) 1.7 - 7.7 (K/uL)   Lymphocytes Relative 7 (*) 12 - 46 (%)   Lymphs Abs 0.7  0.7 - 4.0 (K/uL)   Monocytes Relative 3  3 - 12 (%)   Monocytes Absolute 0.3  0.1 - 1.0 (K/uL)   Eosinophils Relative 1  0 - 5 (%)   Eosinophils Absolute 0.1  0.0 - 0.7 (K/uL)   Basophils Relative 0  0 - 1 (%)   Basophils Absolute 0.0  0.0 - 0.1 (K/uL)  COMPREHENSIVE METABOLIC PANEL      Component Value Range   Sodium 139  135 - 145 (mEq/L)   Potassium 3.8  3.5 - 5.1 (mEq/L)   Chloride 102  96 - 112 (mEq/L)   CO2 26  19 - 32 (mEq/L)   Glucose, Bld 125 (*) 70 - 99 (mg/dL)   BUN 9  6 - 23 (mg/dL)   Creatinine, Ser 7.82  0.50 - 1.10 (mg/dL)   Calcium 9.8  8.4 - 95.6 (mg/dL)   Total Protein 7.6  6.0 - 8.3 (g/dL)   Albumin 3.8  3.5 - 5.2 (g/dL)   AST 23  0 - 37 (U/L)   ALT 20  0 - 35 (U/L)   Alkaline Phosphatase 102  39 - 117 (U/L)   Total Bilirubin 0.4  0.3 - 1.2 (mg/dL)   GFR calc non Af Amer >60  >60 (mL/min)   GFR calc Af Amer >60  >60 (mL/min)  LIPASE, BLOOD      Component Value Range   Lipase 43  11 - 59 (U/L)  URINALYSIS, ROUTINE W REFLEX MICROSCOPIC      Component Value Range   Color, Urine YELLOW  YELLOW    APPearance CLOUDY (*) CLEAR    Specific Gravity, Urine 1.014  1.005 - 1.030    pH 5.5  5.0 - 8.0    Glucose, UA NEGATIVE  NEGATIVE  (mg/dL)   Hgb urine dipstick NEGATIVE  NEGATIVE    Bilirubin Urine NEGATIVE  NEGATIVE    Ketones, ur NEGATIVE  NEGATIVE (mg/dL)   Protein, ur NEGATIVE  NEGATIVE (mg/dL)   Urobilinogen, UA 0.2  0.0 - 1.0 (mg/dL)   Nitrite NEGATIVE  NEGATIVE    Leukocytes, UA LARGE (*) NEGATIVE   URINE CULTURE      Component Value Range   Specimen Description URINE, CLEAN CATCH     Special Requests NONE     Culture  Setup Time [id number]     Colony Count NO GROWTH     Culture NO GROWTH  Report Status 12/23/2010 FINAL    URINE MICROSCOPIC-ADD ON      Component Value Range   Squamous Epithelial / LPF MANY (*) RARE    WBC, UA 21-50  <3 (WBC/hpf)   RBC / HPF 0-2  <3 (RBC/hpf)   Bacteria, UA MANY (*) RARE   A:  Acute uncomplicated UTI

## 2011-06-10 LAB — URINE CULTURE: Colony Count: 25000

## 2011-06-20 ENCOUNTER — Telehealth: Payer: Self-pay

## 2011-06-20 NOTE — Telephone Encounter (Signed)
Pt did have urine culture done

## 2011-06-20 NOTE — Telephone Encounter (Signed)
Pt in office on the 8th for UTI she is still having symptoms and has taken all of her medicaction, she states she is going to have a urine specimen taken at lab at her job and will fax [place] the results, she would like to see if a rx can be called in for her.

## 2011-06-21 NOTE — Telephone Encounter (Signed)
Dr. Milus Glazier,  Are you OK with [place] receiving the urine results and RXing if appropriate results?

## 2011-06-21 NOTE — Telephone Encounter (Signed)
That is fine 

## 2011-06-24 NOTE — Telephone Encounter (Signed)
See Dr. Loma Boston note. Yes, she can fax the results of the urine done at work.

## 2011-06-24 NOTE — Telephone Encounter (Signed)
Left message on machine for patient to call back.

## 2011-06-25 NOTE — Telephone Encounter (Signed)
LMOM at H# to CB and also to verify cell # when she calls (when called cell # was told I had the wrong number.)

## 2011-06-26 ENCOUNTER — Ambulatory Visit (INDEPENDENT_AMBULATORY_CARE_PROVIDER_SITE_OTHER): Payer: 59 | Admitting: Family Medicine

## 2011-06-26 VITALS — BP 101/68 | HR 73 | Temp 98.1°F | Resp 16 | Ht 65.0 in | Wt 160.4 lb

## 2011-06-26 DIAGNOSIS — N390 Urinary tract infection, site not specified: Secondary | ICD-10-CM

## 2011-06-26 DIAGNOSIS — J069 Acute upper respiratory infection, unspecified: Secondary | ICD-10-CM

## 2011-06-26 DIAGNOSIS — R42 Dizziness and giddiness: Secondary | ICD-10-CM

## 2011-06-26 LAB — POCT URINALYSIS DIPSTICK
Bilirubin, UA: NEGATIVE
Glucose, UA: NEGATIVE
Nitrite, UA: POSITIVE
Urobilinogen, UA: 0.2
pH, UA: 6

## 2011-06-26 LAB — POCT UA - MICROSCOPIC ONLY: Casts, Ur, LPF, POC: NEGATIVE

## 2011-06-26 MED ORDER — MECLIZINE HCL 25 MG PO TABS: 25.0000 mg | ORAL_TABLET | Freq: Three times a day (TID) | ORAL | Status: DC | PRN

## 2011-06-26 MED ORDER — AMOXICILLIN 875 MG PO TABS: 875.0000 mg | ORAL_TABLET | Freq: Two times a day (BID) | ORAL | Status: AC

## 2011-06-26 NOTE — Telephone Encounter (Signed)
LMOM to CB. 

## 2011-06-26 NOTE — Patient Instructions (Signed)
Take meclizine as needed, and amoxicillin as prescribed for 10 days. Recheck in 10 days with urine test, or in next 5 days if not improving as may need to see urology for treatment.  Return to the clinic or go to the nearest emergency room if any of your symptoms worsen or new symptoms occur.  Upper Respiratory Infection, Adult An upper respiratory infection (URI) is also known as the common cold. It is often caused by a type of germ (virus). Colds are easily spread (contagious). You can pass it to others by kissing, coughing, sneezing, or drinking out of the same glass. Usually, you get better in 1 or 2 weeks.  HOME CARE   Only take medicine as told by your doctor.   Use a warm mist humidifier or breathe in steam from a hot shower.   Drink enough water and fluids to keep your pee (urine) clear or pale yellow.   Get plenty of rest.   Return to work when your temperature is back to normal or as told by your doctor. You may use a face mask and wash your hands to stop your cold from spreading.  GET HELP RIGHT AWAY IF:   After the first few days, you feel you are getting worse.   You have questions about your medicine.   You have chills, shortness of breath, or brown or red spit (mucus).   You have yellow or brown snot (nasal discharge) or pain in the face, especially when you bend forward.   You have a fever, puffy (swollen) neck, pain when you swallow, or white spots in the back of your throat.   You have a bad headache, ear pain, sinus pain, or chest pain.   You have a high-pitched whistling sound when you breathe in and out (wheezing).   You have a lasting cough or cough up blood.   You have sore muscles or a stiff neck.  MAKE SURE YOU:   Understand these instructions.   Will watch your condition.   Will get help right away if you are not doing well or get worse.  Document Released: 10/02/2007 Document Revised: 12/26/2010 Document Reviewed: 08/20/2010 [facility] Patient  Information 2012 [place], [place].   Urinary Tract Infection Infections of the urinary tract can start in several places. A bladder infection (cystitis), a kidney infection (pyelonephritis), and a prostate infection (prostatitis) are different types of urinary tract infections (UTIs). They usually get better if treated with medicines (antibiotics) that kill germs. Take all the medicine until it is gone. You or your child may feel better in a few days, but TAKE ALL MEDICINE or the infection may not respond and may become more difficult to treat. HOME CARE INSTRUCTIONS   Drink enough water and fluids to keep the urine clear or pale yellow. Cranberry juice is especially recommended, in addition to large amounts of water.   Avoid caffeine, tea, and carbonated beverages. They tend to irritate the bladder.   Alcohol may irritate the prostate.   Only take over-the-counter or prescription medicines for pain, discomfort, or fever as directed by your caregiver.  To prevent further infections:  Empty the bladder often. Avoid holding urine for long periods of time.   After a bowel movement, women should cleanse from front to back. Use each tissue only once.   Empty the bladder before and after sexual intercourse.  FINDING OUT THE RESULTS OF YOUR TEST Not all test results are available during your visit. If your or your child's  test results are not back during the visit, make an appointment with your caregiver to find out the results. Do not assume everything is normal if you have not heard from your caregiver or the medical facility. It is important for you to follow up on all test results. SEEK MEDICAL CARE IF:   There is back pain.   Your baby is older than 3 months with a rectal temperature of 100.5 F (38.1 C) or higher for more than 1 day.   Your or your child's problems (symptoms) are no better in 3 days. Return sooner if you or your child is getting worse.  SEEK IMMEDIATE MEDICAL CARE IF:    There is severe back pain or lower abdominal pain.   You or your child develops chills.   You have a fever.   Your baby is older than 3 months with a rectal temperature of 102 F (38.9 C) or higher.   Your baby is 16 months old or younger with a rectal temperature of 100.4 F (38 C) or higher.   There is nausea or vomiting.   There is continued burning or discomfort with urination.  MAKE SURE YOU:   Understand these instructions.   Will watch your condition.   Will get help right away if you are not doing well or get worse.  Document Released: 01/23/2005 Document Revised: 12/26/2010 Document Reviewed: 08/28/2006 [facility] Patient Information 2012 [place], [place].

## 2011-06-26 NOTE — Progress Notes (Signed)
Subjective:    Patient ID: Katie Maldonado, female    DOB: 1950/10/23, 61 y.o.   MRN: [id number]  HPI Katie Maldonado is a 61 y.o. female  1. UTI - seen 06/07/11, dx w/ UTI, nitrofurantoin and sulfa allergic.  Rx Cipro.  Urine cx 25,000 colonies E.coli. Took 5 days cipro, symptoms improved for 5 or 6 days, then urinary frequency and dysuria.  No fever/N/V.  Still with some urgency/dysuria, intermittent - not as bad as before.     2.  Nasal congestion Sunday afternoon after riding bike.  Sore throat 2 days ago.   Tx: zyrtec.  Made sleepy.  Still with nasal congestion, fatigue, and dizziness.  Dizziness at rest.  No chest pain/SOB/palpitations.  Still with nasal congestion, ear pressure. Hx of BPPV in pas`t, but current sx's not as bad.  Tx: Afrin 2 nights ago.   oow x past days.    Review of Systems  Constitutional: Positive for fatigue. Negative for fever and chills.  HENT: Positive for congestion and sore throat. Negative for ear pain and ear discharge.   Respiratory: Negative for cough, chest tightness and shortness of breath.   Cardiovascular: Negative for chest pain and palpitations.  Gastrointestinal: Negative for nausea, vomiting and abdominal pain.       Lower abdomen/bladder area  Genitourinary: Positive for dysuria and frequency. Negative for hematuria, difficulty urinating and pelvic pain.  Musculoskeletal: Negative for back pain.  Neurological: Positive for dizziness. Negative for speech difficulty and weakness.       Objective:   Physical Exam  Constitutional: She is oriented to person, place, and time. She appears well-developed and well-nourished.  HENT:  Head: Normocephalic and atraumatic.  Right Ear: External ear normal. Tympanic membrane is not injected, not erythematous, not retracted and not bulging. A middle ear effusion is present.  Left Ear: External ear normal. Tympanic membrane is not injected, not erythematous, not retracted and not bulging. A  middle ear effusion is present.  Mouth/Throat: Oropharynx is clear and moist. No oropharyngeal exudate.       Clear fluid behind tm's  Eyes: Conjunctivae are normal. Pupils are equal, round, and reactive to light.       Few beats horizontal nystagmus to left.  Neck: Neck supple.  Cardiovascular: Normal rate, regular rhythm and normal heart sounds.  Exam reveals no gallop and no friction rub.   No murmur heard.      No carotid bruits.  Pulmonary/Chest: Effort normal and breath sounds normal. No respiratory distress.  Abdominal: Soft. Bowel sounds are normal. She exhibits no distension. There is no tenderness. There is no CVA tenderness.  Neurological: She is alert and oriented to person, place, and time. She has normal strength. No sensory deficit. She displays a negative Romberg sign.  Skin: Skin is warm and dry.  Psychiatric: She has a normal mood and affect.   Results for orders placed in visit on 06/26/11  POCT URINALYSIS DIPSTICK      Component Value Range   Color, UA yellow     Clarity, UA cloudy     Glucose, UA negative     Bilirubin, UA negative     Ketones, UA negative     Spec Grav, UA 1.010     Blood, UA small     pH, UA 6.0     Protein, UA negative     Urobilinogen, UA 0.2     Nitrite, UA positive     Leukocytes, UA large (3+)  POCT UA - MICROSCOPIC ONLY      Component Value Range   WBC, Ur, HPF, POC TNTC     RBC, urine, microscopic 1-5     Bacteria, U Microscopic 4+     Mucus, UA negative     Epithelial cells, urine per micros 0-1     Crystals, Ur, HPF, POC negative     Casts, Ur, LPF, POC negative     Yeast, UA negative           Assessment & Plan:  Katie Maldonado is a 61 y.o. female  1.  URI - Likely viral, with serous otitis/nasal congestion contributing to dizziness.  Hx of BPPV, but current sx's not as severe.  Afebrile, reassuring exam. Sx care - fluids, rest, saline nasal spray +/- short course of Afrin (up to 3 days).  Can try meclizine  25mg  Q8h prn.  RTC/ER precautions discussed including any chest pain/palpitations, weakness, or any worsening or new symptoms.  2. UTI - recurrent, but subjectively improved.   Prior cx. indicated cipro resistance, but allergic to cephalosporins, sulfa, and macrobid.  2nd MD consulted - will try amox 875mg  bid x 10 days (patient has taken amoxicillin in past without any difficulty) as prior cx. w/ intermediate resistance, and recheck u/a in 10 days.  RTC sooner if any new or worsening symptoms.  May need urology eval if Amoxicillin not treating symptoms.

## 2011-06-26 NOTE — Telephone Encounter (Signed)
Pt RTC and stated we know longer need to call her since she is back to be seen.

## 2011-06-29 LAB — URINE CULTURE: Colony Count: >=100000

## 2011-07-05 ENCOUNTER — Telehealth: Payer: Self-pay

## 2011-07-05 ENCOUNTER — Encounter: Payer: Self-pay | Admitting: Radiology

## 2011-07-05 NOTE — Telephone Encounter (Signed)
Pt called back about last UA result after receiving unable to reach letter. 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 Pt reports she is improving, but not completely resolved yet.

## 2011-07-10 ENCOUNTER — Telehealth: Payer: Self-pay

## 2011-07-10 NOTE — Telephone Encounter (Signed)
Pt states she got her letter stating we could not reach her.  Phones are right  Dr. Neva Seat was going to write an order for urine test in her [address] [place] in her building  [phone]  Work okay to leave message on all three of her phones

## 2011-07-11 ENCOUNTER — Other Ambulatory Visit: Payer: Self-pay | Admitting: Family Medicine

## 2011-07-11 NOTE — Telephone Encounter (Signed)
Dr. Neva Seat is working on ordering labs for [employer] (see other phone message that was routed to him)

## 2011-08-01 NOTE — Telephone Encounter (Signed)
Asked Dr Neva Seat and then [facility] about this lab. Renee reports order for lab was sent to [facility] back when f/up lab was due. Luster Landsberg is checking to see if she can get the results for Dr Neva Seat.

## 2011-08-04 ENCOUNTER — Other Ambulatory Visit: Payer: Self-pay | Admitting: Family Medicine

## 2011-08-06 LAB — URINALYSIS, MICROSCOPIC ONLY: Crystals: NONE SEEN

## 2011-08-06 LAB — URINALYSIS, ROUTINE W REFLEX MICROSCOPIC
Ketones, ur: NEGATIVE mg/dL
Nitrite: NEGATIVE
Protein, ur: NEGATIVE mg/dL
Specific Gravity, Urine: 1.005 (ref 1.005–1.030)
Urobilinogen, UA: 0.2 mg/dL (ref 0.0–1.0)

## 2011-08-12 ENCOUNTER — Ambulatory Visit (INDEPENDENT_AMBULATORY_CARE_PROVIDER_SITE_OTHER): Payer: 59 | Admitting: Internal Medicine

## 2011-08-12 VITALS — BP 133/85 | HR 64 | Temp 98.4°F | Resp 16 | Ht 64.75 in | Wt 162.4 lb

## 2011-08-12 DIAGNOSIS — J019 Acute sinusitis, unspecified: Secondary | ICD-10-CM

## 2011-08-12 MED ORDER — FLUTICASONE PROPIONATE 50 MCG/ACT NA SUSP: 2.0000 | Freq: Every day | NASAL | Status: DC

## 2011-08-12 MED ORDER — AMOXICILLIN 500 MG PO CAPS: 1000.0000 mg | ORAL_CAPSULE | Freq: Two times a day (BID) | ORAL | Status: AC

## 2011-08-12 NOTE — Patient Instructions (Signed)
Out of work today and possibly tomorrow due to infection

## 2011-08-12 NOTE — Progress Notes (Signed)
  Subjective:    Patient ID: Katie Maldonado, female    DOB: [birth year], 61 y.o.   MRN: [id number]  HPIThree-week history of increasing sinus pain and congestion Started with runny nose and sneezing/no history of spring allergies No fever Minimal cough No sore throat    Review of Systems MS stable    Objective:   Physical Exam Vital signs stable TMs clear Nares with purulent discharge Bilateral maxillary sinus tenderness to percussion Oropharynx clear No wheezing with forced expiration       Assessment & Plan:  Acute sinusitis  Amoxicillin 1 g twice a day for 10 days Flonase 2 sprays each nostril at bedtime for one month Decongestants Recheck if not well one week

## 2011-08-23 ENCOUNTER — Telehealth: Payer: Self-pay

## 2011-08-23 ENCOUNTER — Other Ambulatory Visit: Payer: Self-pay | Admitting: Family Medicine

## 2011-08-23 DIAGNOSIS — Z1231 Encounter for screening mammogram for malignant neoplasm of breast: Secondary | ICD-10-CM

## 2011-08-23 NOTE — Telephone Encounter (Signed)
Can we give her a referral?

## 2011-08-23 NOTE — Telephone Encounter (Signed)
Need more information.

## 2011-08-23 NOTE — Telephone Encounter (Signed)
Pt would like a referral to the [facility] to have a bone density test done.

## 2011-08-24 NOTE — Telephone Encounter (Signed)
Spoke with patient, she states that she gets a bone density test done every 2 years.  She has OA and MS.  Has been scheduled for her mammo at the [facility] on May 25th, and they stated she was due for her bone density, but would need an order.  Can we get this scheduled for the same day?

## 2011-08-24 NOTE — Telephone Encounter (Signed)
Patient called requesting bone density. H/o osteoporosis. Last bone density 2011 rec's of repeat in 2 years. Patient would like to have study done at the same time as her MMG. Ok to do bone density.  Katie Maldonado

## 2011-09-16 ENCOUNTER — Ambulatory Visit (INDEPENDENT_AMBULATORY_CARE_PROVIDER_SITE_OTHER): Payer: 59 | Admitting: Family Medicine

## 2011-09-16 VITALS — BP 126/75 | HR 64 | Temp 97.9°F | Resp 16 | Ht 65.0 in | Wt 167.6 lb

## 2011-09-16 DIAGNOSIS — Z0000 Encounter for general adult medical examination without abnormal findings: Secondary | ICD-10-CM

## 2011-09-16 DIAGNOSIS — M25579 Pain in unspecified ankle and joints of unspecified foot: Secondary | ICD-10-CM

## 2011-09-16 DIAGNOSIS — E785 Hyperlipidemia, unspecified: Secondary | ICD-10-CM

## 2011-09-16 DIAGNOSIS — G35 Multiple sclerosis: Secondary | ICD-10-CM

## 2011-09-16 DIAGNOSIS — Z23 Encounter for immunization: Secondary | ICD-10-CM

## 2011-09-16 LAB — LIPID PANEL
Cholesterol: 239 mg/dL — ABNORMAL HIGH (ref 0–200)
HDL: 58 mg/dL (ref >39)
Total CHOL/HDL Ratio: 4.1 Ratio
Triglycerides: 160 mg/dL — ABNORMAL HIGH (ref <150)
VLDL: 32 mg/dL (ref 0–40)

## 2011-09-16 LAB — COMPREHENSIVE METABOLIC PANEL
BUN: 11 mg/dL (ref 6–23)
CO2: 25 mEq/L (ref 19–32)
Creat: 0.8 mg/dL (ref 0.50–1.10)
Glucose, Bld: 75 mg/dL (ref 70–99)
Total Bilirubin: 0.6 mg/dL (ref 0.3–1.2)

## 2011-09-16 LAB — URIC ACID: Uric Acid, Serum: 6.7 mg/dL (ref 2.4–7.0)

## 2011-09-16 NOTE — Progress Notes (Signed)
  Subjective:    Patient ID: Katie Maldonado, female    DOB: 1950-07-30, 61 y.o.   MRN: [id number]  HPI  Patient presents for CPE  Multiple sclerosis; chronic relapsing variant; with relapses q 6 months(Dr. Love).  Daily Copaxone injections.  Dyslipidemia- Monitors dietary cholesterol; exercises intermittently.  Recurrent swelling of (L) ankle foot; resolves without sequelae.  No history of gout or other inflammatory arthropathy.   Health Maintenance;  Mammogram/DEXA this Friday Colonoscopy 2007 (Dr. Loreta Ave) S/P TAH   Review of Systems     Objective:   Physical Exam  Constitutional: She appears well-developed and well-nourished.  HENT:  Head: Normocephalic and atraumatic.  Right Ear: External ear normal.  Left Ear: External ear normal.  Nose: Nose normal.  Mouth/Throat: Oropharynx is clear and moist.  Eyes: EOM are normal. Pupils are equal, round, and reactive to light.  Neck: Normal range of motion. Neck supple. No thyromegaly present.  Cardiovascular: Normal rate, regular rhythm, normal heart sounds and intact distal pulses.   Pulmonary/Chest: Effort normal and breath sounds normal. Right breast exhibits no mass, no nipple discharge and no skin change. Left breast exhibits no mass, no nipple discharge and no skin change.  Abdominal: Soft. Bowel sounds are normal. She exhibits no mass.       No HSM  Musculoskeletal: Normal range of motion.  Neurological: She is alert. She has normal strength. No sensory deficit. She exhibits normal muscle tone.  Reflex Scores:      Bicep reflexes are 1+ on the right side and 1+ on the left side.      Patellar reflexes are 1+ on the right side and 1+ on the left side. Skin: Skin is warm.  Psychiatric: She has a normal mood and affect.          Assessment & Plan:   1. Routine general medical examination at a health care facility    2. MS (multiple sclerosis)  CBC with Differential, Comprehensive metabolic panel, Lipid panel,  TSH  3. Swelling of joint of ankle or foot, right  Uric acid, POCT SEDIMENTATION RATE  4. Immunization due  Pneumococcal polysaccharide vaccine 23-valent greater than or equal to 2yo subcutaneous/IM  5. Dyslipidemia       Anticipatory guidance Zostavax Rx provided

## 2011-09-17 LAB — CBC WITH DIFFERENTIAL/PLATELET
Eosinophils Absolute: 0.1 10*3/uL (ref 0.0–0.7)
Eosinophils Relative: 2 % (ref 0–5)
Lymphs Abs: 1.8 10*3/uL (ref 0.7–4.0)
MCH: 29.7 pg (ref 26.0–34.0)
MCV: 86.6 fL (ref 78.0–100.0)
Monocytes Relative: 6 % (ref 3–12)
Platelets: 275 10*3/uL (ref 150–400)
RBC: 4.62 MIL/uL (ref 3.87–5.11)

## 2011-09-20 ENCOUNTER — Ambulatory Visit
Admission: RE | Admit: 2011-09-20 | Discharge: 2011-09-20 | Disposition: A | Discharge status: Home / Self Care | Payer: 59 | Source: Ambulatory Visit | Attending: Family Medicine | Admitting: Family Medicine

## 2011-09-20 DIAGNOSIS — Z1231 Encounter for screening mammogram for malignant neoplasm of breast: Secondary | ICD-10-CM

## 2011-09-21 ENCOUNTER — Encounter: Payer: Self-pay | Admitting: *Deleted

## 2011-09-22 ENCOUNTER — Encounter: Payer: Self-pay | Admitting: Family Medicine

## 2011-09-25 ENCOUNTER — Other Ambulatory Visit: Payer: Self-pay | Admitting: Family Medicine

## 2011-09-26 ENCOUNTER — Telehealth: Payer: Self-pay

## 2011-09-26 NOTE — Telephone Encounter (Signed)
The patient called concerned that she had not yet received her labs in the mail from her 09/16/11 office visit.

## 2011-09-29 NOTE — Telephone Encounter (Signed)
Voicemail left and letter sent. Left message on machine for pt to call back so we can answer any questions she may have

## 2011-09-30 NOTE — Telephone Encounter (Signed)
Letter was sent on 5/25 asking pt to CB if she has any questions.

## 2011-11-11 ENCOUNTER — Encounter: Payer: 59 | Attending: Family Medicine | Admitting: *Deleted

## 2011-11-11 ENCOUNTER — Encounter: Payer: Self-pay | Admitting: *Deleted

## 2011-11-11 VITALS — Ht 65.0 in | Wt 170.3 lb

## 2011-11-11 DIAGNOSIS — E785 Hyperlipidemia, unspecified: Secondary | ICD-10-CM

## 2011-11-11 DIAGNOSIS — E669 Obesity, unspecified: Secondary | ICD-10-CM | POA: Insufficient documentation

## 2011-11-11 DIAGNOSIS — Z713 Dietary counseling and surveillance: Secondary | ICD-10-CM | POA: Insufficient documentation

## 2011-11-11 NOTE — Patient Instructions (Addendum)
Plan: Consider ways to increase activity level remembering your preference to being inside  Consider using Metamucil to increase soluble fiber intake Consider eating less animal fats and moderate servings of plant sources of fat Read food labels for Saturated and Unsaturated fats Use lean protein and small servings of unsaturated fats to help control hunger

## 2011-11-11 NOTE — Progress Notes (Signed)
  Medical Nutrition Therapy:  Appt start time: 1645 end time:  1745.  Assessment:  Primary concerns today: hyperlipidemia and obesity. She states she wants to address cholesterol information first then work on weight loss. 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 She does complain of constant hunger which she finds frustrating.   MEDICATIONS: see list   DIETARY INTAKE:  Usual eating pattern includes 3 meals and 2-3 snacks per day.  Everyday foods include good variety of all food groups.  Avoided foods include none stated.    24-hr recall:  B ( AM): PNB crackers OR 2 bacon with fried egg OR bacon and pancakes with syrup, coffee with S & Low and Creamer Snk ( AM): 1/3 bag popcorn  L ( PM): bring from home: sandwich OR soup OR salad, water Snk ( PM): none D ( PM): eat out OR meat and vegetable, salad, starch occasionally, diet soda or water, occasionally beer or wine Snk ( PM): none Beverages: water, diet soda, coffee, beer or wine  Usual physical activity: ride bike with grandsons, occasionally walk at work  Estimated energy needs: 1400 calories 158 g carbohydrates 105 g protein 39 g fat  Progress Towards Goal(s):  In progress.   Nutritional Diagnosis:  NB-1.1 Food and nutrition-related knowledge deficit As related to hyperlipidemia.  As evidenced by elevated triglycerides and total cholesterol .    Intervention:  Nutrition counseling on hyperlipidemia provided. Also discussed options for increasing activity level and what types of activities she might be successful with. Plan to provide more calorie information at next visit Plan: Consider ways to increase activity level remembering your preference to being inside  Consider using Metamucil to increase soluble fiber intake Consider eating less animal fats and moderate servings of plant sources of fat Read food labels for Saturated and Unsaturated fats Use  lean protein and small servings of unsaturated fats to help control hunger  Handouts given during visit include:  Dest[facility] Heart Healthy Eating   Food Label for   Monitoring/Evaluation:  Dietary intake, exercise, reading food labels, and body weight in 4 week(s).

## 2011-12-09 ENCOUNTER — Ambulatory Visit: Payer: 59 | Admitting: *Deleted

## 2011-12-10 ENCOUNTER — Other Ambulatory Visit: Payer: Self-pay | Admitting: Family Medicine

## 2011-12-10 DIAGNOSIS — N644 Mastodynia: Secondary | ICD-10-CM

## 2011-12-10 DIAGNOSIS — N6314 Unspecified lump in the right breast, lower inner quadrant: Secondary | ICD-10-CM

## 2011-12-18 ENCOUNTER — Ambulatory Visit: Payer: 59 | Admitting: Family Medicine

## 2012-01-07 ENCOUNTER — Encounter: Payer: Self-pay | Admitting: *Deleted

## 2012-04-01 ENCOUNTER — Other Ambulatory Visit: Payer: Self-pay | Admitting: Family Medicine

## 2012-07-13 ENCOUNTER — Encounter: Payer: Self-pay | Admitting: Family Medicine

## 2012-09-14 ENCOUNTER — Ambulatory Visit (INDEPENDENT_AMBULATORY_CARE_PROVIDER_SITE_OTHER): Payer: 59 | Admitting: Family Medicine

## 2012-09-14 ENCOUNTER — Ambulatory Visit: Payer: 59

## 2012-09-14 VITALS — BP 104/70 | HR 81 | Temp 98.3°F | Resp 16 | Ht 64.5 in | Wt 161.0 lb

## 2012-09-14 DIAGNOSIS — M549 Dorsalgia, unspecified: Secondary | ICD-10-CM

## 2012-09-14 DIAGNOSIS — R350 Frequency of micturition: Secondary | ICD-10-CM

## 2012-09-14 LAB — POCT UA - MICROSCOPIC ONLY
Bacteria, U Microscopic: NEGATIVE
Casts, Ur, LPF, POC: NEGATIVE
Crystals, Ur, HPF, POC: NEGATIVE

## 2012-09-14 LAB — POCT URINALYSIS DIPSTICK
Blood, UA: NEGATIVE
Glucose, UA: NEGATIVE
Nitrite, UA: NEGATIVE
Protein, UA: NEGATIVE
Spec Grav, UA: <=1.005
Urobilinogen, UA: 0.2

## 2012-09-14 MED ORDER — AMOXICILLIN 500 MG PO TABS: 500.0000 mg | ORAL_TABLET | Freq: Two times a day (BID) | ORAL | Status: DC

## 2012-09-14 NOTE — Progress Notes (Addendum)
 [facility]:  Office Visit  Chief Complaint:  Chief Complaint  Patient presents with  . Hematuria  . Back Pain    lower    HPI: Katie Maldonado is a 62 y.o. female who complains of  Middle Low back pain x 4 weeks and was having problems urinating, had increase frequency and then dysuria. Denies fevers, chills. She has had a h/o UTIs. She does hold her urine.  She was seeing Dr. Birdena Jubilee because of imcomplete emptying on Enablex but did not help so stopped. NKI. No new exercises. No n/v/abd or pelvic pain, denies weakness, numbness, tingling.   Past Medical History  Diagnosis Date  . Multiple sclerosis   . Other and unspecified hyperlipidemia   . Obesity, unspecified   . Allergy   . Arthritis   . Neuromuscular disorder   . Osteoporosis    Past Surgical History  Procedure Laterality Date  . Hernia repair    . Tubal ligation    . Wrist surgery    . Arm surgery left     History   Social History  . Marital Status: Married    Spouse Name: N/A    Number of Children: N/A  . Years of Education: N/A   Social History Main Topics  . Smoking status: Former Smoker -- 37 years    Types: Cigarettes    Quit date: 02/26/2004  . Smokeless tobacco: Never Used  . Alcohol Use: No  . Drug Use: No  . Sexually Active: None   Other Topics Concern  . None   Social History Narrative  . None   Family History  Problem Relation Age of Onset  . Emphysema Father   . Cancer Brother     bone  . Cancer Maternal Grandmother   . Heart attack Brother    Allergies  Allergen Reactions  . Nitrofurantoin Shortness Of Breath    Shortness of breath, violent chills  . Sulfonamide Derivatives Shortness Of Breath    Shortness of breath, chills  . Cefdinir     Can't remember what rx had 2 yrs ago   Prior to Admission medications   Medication Sig Start Date End Date Taking? Authorizing Provider  aspirin EC 81 MG tablet Take 1 tablet (81 mg total) by mouth daily.  04/18/11  Yes Sanjuana Letters, MD  cholecalciferol (VITAMIN D) 1000 UNITS tablet Take 2 tablets (2,000 Units total) by mouth daily. 04/18/11  Yes Sanjuana Letters, MD  glatiramer (COPAXONE) 20 MG/ML injection Inject 20 mg into the skin daily.     Yes Historical Provider, MD  ibuprofen (ADVIL,MOTRIN) 200 MG tablet Take 2 tablets (400 mg total) by mouth 4 (four) times daily. 04/18/11  Yes Sanjuana Letters, MD  meclizine (ANTIVERT) 25 MG tablet TAKE 1 TABLET BY MOUTH 3 TIMES DAILY AS NEEDED. 04/01/12  Yes Ryan M Dunn, PA-C  Omega-3 Fatty Acids (FISH OIL) 1000 MG CAPS Take 1 capsule (1,000 mg total) by mouth daily. 04/18/11  Yes Sanjuana Letters, MD  ondansetron (ZOFRAN) 4 MG tablet Take 1 tablet (4 mg total) by mouth every 8 (eight) hours as needed for nausea. Approximately two times per day 04/18/11  Yes Sanjuana Letters, MD  Red Yeast Rice Extract (RED YEAST RICE PO) Take by mouth.   Yes Historical Provider, MD  darifenacin (ENABLEX) 7.5 MG 24 hr tablet Take 1 tablet (7.5 mg total) by mouth daily. 04/18/11   Sanjuana Letters, MD  diclofenac sodium (VOLTAREN)  1 % GEL Apply 1 application topically 4 (four) times daily as needed. Currently taking once daily 04/18/11   Sanjuana Letters, MD  fluticasone [facility]) 50 MCG/ACT nasal spray Place 2 sprays into the nose daily. 08/12/11 08/11/12  Tonye Pearson, MD  meclizine (ANTIVERT) 25 MG tablet Take 25 mg by mouth 3 (three) times daily as needed.    Historical Provider, MD  ondansetron (ZOFRAN-ODT) 4 MG disintegrating tablet TAKE 1 TABLET BY MOUTH EVERY 6 HOURS AS NEEDED 09/25/11   Sondra Barges, PA-C  oxaprozin (DAYPRO) 600 MG tablet Take 1,200 mg by mouth daily.    Historical Provider, MD  traMADol (ULTRAM) 50 MG tablet Take 1 tablet (50 mg total) by mouth every 8 (eight) hours as needed for pain. Maximum dose= 8 tablets per day. Takes 2-3 times per day 04/18/11   Sanjuana Letters, MD     ROS: The patient denies  fevers, chills, night sweats, unintentional weight loss, chest pain, palpitations, wheezing, dyspnea on exertion, nausea, vomiting, abdominal pain, hematuria, melena, numbness, weakness, or tingling.   All other systems have been reviewed and were otherwise negative with the exception of those mentioned in the HPI and as above.    PHYSICAL EXAM: Filed Vitals:   09/14/12 1546  BP: 104/70  Pulse: 81  Temp: 98.3 F (36.8 C)  Resp: 16   Filed Vitals:   09/14/12 1546  Height: 5' 4.5" (1.638 m)  Weight: 161 lb (73.029 kg)   Body mass index is 27.22 kg/(m^2).  General: Alert, no acute distress HEENT:  Normocephalic, atraumatic, oropharynx patent.  Cardiovascular:  Regular rate and rhythm, no rubs murmurs or gallops.  No Carotid bruits, radial pulse intact. No pedal edema.  Respiratory: Clear to auscultation bilaterally.  No wheezes, rales, or rhonchi.  No cyanosis, no use of accessory musculature GI: No organomegaly, abdomen is soft and non-tender, positive bowel sounds.  No masses. Skin: No rashes. Neurologic: Facial musculature symmetric. Psychiatric: Patient is appropriate throughout our interaction. Lymphatic: No cervical lymphadenopathy Musculoskeletal: Gait intact. Back exam grossly normal, 5/5 strength, neg straight leg No CVA tenderness   LABS: Results for orders placed in visit on 09/14/12  POCT UA - MICROSCOPIC ONLY      Result Value Range   WBC, Ur, HPF, POC negative     RBC, urine, microscopic negative     Bacteria, U Microscopic negative     Mucus, UA negative     Epithelial cells, urine per micros 0-1     Crystals, Ur, HPF, POC negative     Casts, Ur, LPF, POC negative     Yeast, UA negative    POCT URINALYSIS DIPSTICK      Result Value Range   Color, UA yellow     Clarity, UA clear     Glucose, UA negative     Bilirubin, UA negative     Ketones, UA negative     Spec Grav, UA <=1.005     Blood, UA negative     pH, UA 6.0     Protein, UA negative      Urobilinogen, UA 0.2     Nitrite, UA negative     Leukocytes, UA Negative       EKG/XRAY:   Primary read interpreted by Dr. Conley Rolls at [facility]. No fx/dislocation   ASSESSMENT/PLAN: Encounter Diagnoses  Name Primary?  . Back pain Yes  . Urinary frequency    Cx urine Will give trial of amox 500 mg BID x  10 days and await for urine cx due to prior h/o E coli UTIs which was sensitive to PCN but not Cipro She has multiple drug allergies Gross sideeffects, risk and benefits, and alternatives of medications d/w patient. Patient is aware that all medications have potential sideeffects and we are unable to predict every sideeffect or drug-drug interaction that may occur. F/u prn    ,  PHUONG, DO 09/14/2012 4:44 PM

## 2012-09-15 LAB — URINE CULTURE
Colony Count: NO GROWTH
Organism ID, Bacteria: NO GROWTH

## 2012-09-23 ENCOUNTER — Telehealth: Payer: Self-pay

## 2012-09-23 NOTE — Telephone Encounter (Signed)
Called her, she is advised.

## 2012-09-23 NOTE — Telephone Encounter (Signed)
Patient calling about lab results [phone]

## 2012-10-13 ENCOUNTER — Ambulatory Visit (INDEPENDENT_AMBULATORY_CARE_PROVIDER_SITE_OTHER): Payer: 59 | Admitting: Family Medicine

## 2012-10-13 VITALS — BP 124/75 | HR 73 | Temp 98.0°F | Resp 16 | Ht 65.0 in | Wt 158.0 lb

## 2012-10-13 DIAGNOSIS — B029 Zoster without complications: Secondary | ICD-10-CM

## 2012-10-13 MED ORDER — VALACYCLOVIR HCL 1 G PO TABS: 1000.0000 mg | ORAL_TABLET | Freq: Three times a day (TID) | ORAL | Status: DC

## 2012-10-13 NOTE — Patient Instructions (Signed)
Shingles Shingles (herpes zoster) is an infection that is caused by the same virus that causes chickenpox (varicella). The infection causes a painful skin rash and fluid-filled blisters, which eventually break open, crust over, and heal. It may occur in any area of the body, but it usually affects only one side of the body or face. The pain of shingles usually lasts about 1 month. However, some people with shingles may develop long-term (chronic) pain in the affected area of the body. Shingles often occurs many years after the person had chickenpox. It is more common:  In people older than 50 years.  In people with weakened immune systems, such as those with HIV, AIDS, or cancer.  In people taking medicines that weaken the immune system, such as transplant medicines.  In people under great stress. CAUSES  Shingles is caused by the varicella zoster virus (VZV), which also causes chickenpox. After a person is infected with the virus, it can remain in the person's body for years in an inactive state (dormant). To cause shingles, the virus reactivates and breaks out as an infection in a nerve root. The virus can be spread from person to person (contagious) through contact with open blisters of the shingles rash. It will only spread to people who have not had chickenpox. When these people are exposed to the virus, they may develop chickenpox. They will not develop shingles. Once the blisters scab over, the person is no longer contagious and cannot spread the virus to others. SYMPTOMS  Shingles shows up in stages. The initial symptoms may be pain, itching, and tingling in an area of the skin. This pain is usually described as burning, stabbing, or throbbing.In a few days or weeks, a painful red rash will appear in the area where the pain, itching, and tingling were felt. The rash is usually on one side of the body in a band or belt-like pattern. Then, the rash usually turns into fluid-filled blisters. They  will scab over and dry up in approximately 2 3 weeks. Flu-like symptoms may also occur with the initial symptoms, the rash, or the blisters. These may include:  Fever.  Chills.  Headache.  Upset stomach. DIAGNOSIS  Your caregiver will perform a skin exam to diagnose shingles. Skin scrapings or fluid samples may also be taken from the blisters. This sample will be examined under a microscope or sent to a lab for further testing. TREATMENT  There is no specific cure for shingles. Your caregiver will likely prescribe medicines to help you manage the pain, recover faster, and avoid long-term problems. This may include antiviral drugs, anti-inflammatory drugs, and pain medicines. HOME CARE INSTRUCTIONS   Take a cool bath or apply cool compresses to the area of the rash or blisters as directed. This may help with the pain and itching.   Only take over-the-counter or prescription medicines as directed by your caregiver.   Rest as directed by your caregiver.  Keep your rash and blisters clean with mild soap and cool water or as directed by your caregiver.  Do not pick your blisters or scratch your rash. Apply an anti-itch cream or numbing creams to the affected area as directed by your caregiver.  Keep your shingles rash covered with a loose bandage (dressing).  Avoid skin contact with:  Babies.   Pregnant women.   Children with eczema.   Elderly people with transplants.   People with chronic illnesses, such as leukemia or AIDS.   Wear loose-fitting clothing to help ease   the pain of material rubbing against the rash.  Keep all follow-up appointments with your caregiver.If the area involved is on your face, you may receive a referral for follow-up to a specialist, such as an eye doctor (ophthalmologist) or an ear, nose, and throat (ENT) doctor. Keeping all follow-up appointments will help you avoid eye complications, chronic pain, or disability.  SEEK IMMEDIATE MEDICAL  CARE IF:   You have facial pain, pain around the eye area, or loss of feeling on one side of your face.  You have ear pain or ringing in your ear.  You have loss of taste.  Your pain is not relieved with prescribed medicines.   Your redness or swelling spreads.   You have more pain and swelling.  Your condition is worsening or has changed.   You have a feveror persistent symptoms for more than 2 3 days.  You have a fever and your symptoms suddenly get worse. MAKE SURE YOU:  Understand these instructions.  Will watch your condition.  Will get help right away if you are not doing well or get worse. Document Released: 04/15/2005 Document Revised: 01/08/2012 Document Reviewed: 11/28/2011 Exit[employer] Patient Information 2014 [employer].  

## 2012-10-13 NOTE — Progress Notes (Signed)
To 62-year-old woman who developed a vesicular eruption over the right hip about 18 hours prior to arrival. Burning a little bit but she took ibuprofen and the symptoms are now controlled. She's had 2 prior episodes of shingles so she thinks this is since.  Objective: Classical dermatomal distribution of vesicular eruption over the right 12th nerve  Assessment: Shingles  Plan: Valtrex 1 g 3 times a day times a week and Zostavax BAK/C Signed, Elvina Sidle, MD

## 2012-10-14 ENCOUNTER — Telehealth: Payer: Self-pay | Admitting: Diagnostic Neuroimaging

## 2012-10-14 NOTE — Telephone Encounter (Signed)
Calling to reschedule patient's 12/30/2012 appt with Dr. Marjory Lies

## 2012-10-15 ENCOUNTER — Telehealth: Payer: Self-pay

## 2012-10-15 NOTE — Telephone Encounter (Signed)
Pt states she was diagnosed with shingles recently and wants to know if going in the sun and getting in lake water will hurt it.  Pt asks to please leave her a detailed message with an answer if she doesn't pick up when called.   Pt best: [phone]  bf

## 2012-10-16 NOTE — Telephone Encounter (Signed)
She should keep this area out of the sun and the lake until blisters healed over. Called her to advise, left message for her to call me back, to see if this is dried up or still blistered.

## 2012-10-22 NOTE — Telephone Encounter (Signed)
Patient has not returned my calls

## 2012-10-29 ENCOUNTER — Telehealth: Payer: Self-pay | Admitting: Diagnostic Neuroimaging

## 2012-11-13 ENCOUNTER — Encounter: Payer: Self-pay | Admitting: Family Medicine

## 2012-11-13 ENCOUNTER — Ambulatory Visit: Payer: 59

## 2012-11-13 ENCOUNTER — Ambulatory Visit (INDEPENDENT_AMBULATORY_CARE_PROVIDER_SITE_OTHER): Payer: 59 | Admitting: Family Medicine

## 2012-11-13 VITALS — BP 130/81 | HR 85 | Temp 97.9°F | Resp 16 | Ht 65.0 in | Wt 155.0 lb

## 2012-11-13 DIAGNOSIS — Z0000 Encounter for general adult medical examination without abnormal findings: Secondary | ICD-10-CM

## 2012-11-13 DIAGNOSIS — M810 Age-related osteoporosis without current pathological fracture: Secondary | ICD-10-CM

## 2012-11-13 DIAGNOSIS — R0609 Other forms of dyspnea: Secondary | ICD-10-CM

## 2012-11-13 DIAGNOSIS — R0600 Dyspnea, unspecified: Secondary | ICD-10-CM

## 2012-11-13 DIAGNOSIS — E785 Hyperlipidemia, unspecified: Secondary | ICD-10-CM

## 2012-11-13 LAB — COMPREHENSIVE METABOLIC PANEL
BUN: 12 mg/dL (ref 6–23)
CO2: 27 mEq/L (ref 19–32)
Calcium: 9.7 mg/dL (ref 8.4–10.5)
Chloride: 104 mEq/L (ref 96–112)
Creat: 0.75 mg/dL (ref 0.50–1.10)
Total Bilirubin: 0.7 mg/dL (ref 0.3–1.2)

## 2012-11-13 LAB — LIPID PANEL
Cholesterol: 243 mg/dL — ABNORMAL HIGH (ref 0–200)
HDL: 66 mg/dL (ref >39)
Total CHOL/HDL Ratio: 3.7 Ratio
VLDL: 21 mg/dL (ref 0–40)

## 2012-11-13 NOTE — Progress Notes (Signed)
Subjective:    Patient ID: Katie Maldonado, female    DOB: December 17, 1950, 62 y.o.   MRN: [id number]  HPI  This 62 y.o. Cauc female is here for CPE. She has Multiple Sclerosis which is managed by  [facility] (Dr. Marjory Lies). Pt has minor concerns about "moles and age spots every-  where". She has some agitation and anxiety; this is accompanied by decreased concentration.  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     HCM: PAP- S/P TAH for benign reason.            MMG- May 2013.            DEXA- May 2012 (hx of osteoporosis).            CRS- 2009 (normal).   Patient Active Problem List   Diagnosis Date Noted  . Osteoporosis, unspecified 11/13/2012  . Osteoarthritis of multiple joints 04/18/2011  . Incontinence of urine 04/18/2011  . HYPERLIPIDEMIA-MIXED 01/08/2010  . MULTIPLE SCLEROSIS 01/08/2010  . DYSPNEA 01/05/2010   PMHx, Soc Hx and Fam Hx reviewed.   Review of Systems  Constitutional: Positive for fatigue. Negative for fever, appetite change and unexpected weight change.  HENT: Positive for tinnitus. Negative for hearing loss, ear pain, congestion, sore throat, rhinorrhea, trouble swallowing, dental problem, postnasal drip and ear discharge.   Eyes: Negative.   Respiratory: Positive for shortness of breath. Negative for cough, chest tightness and wheezing.   Cardiovascular: Negative.  Negative for chest pain and palpitations.  Gastrointestinal: Positive for nausea.  Endocrine: Positive for cold intolerance and heat intolerance.  Genitourinary: Positive for urgency and frequency. Negative for dysuria, hematuria, flank pain, decreased urine volume, difficulty urinating, vaginal pain and pelvic pain.  Musculoskeletal: Positive for joint swelling and arthralgias. Negative for myalgias, back pain and gait problem.  Skin: Negative.   Neurological: Positive for light-headedness. Negative for dizziness,  syncope, facial asymmetry, speech difficulty, weakness, numbness and headaches.  Hematological: Bruises/bleeds easily.  Psychiatric/Behavioral: Positive for decreased concentration and agitation. Negative for suicidal ideas, behavioral problems, confusion, sleep disturbance, self-injury and dysphoric mood. The patient is nervous/anxious.        Objective:   Physical Exam  Nursing note and vitals reviewed. Constitutional: She is oriented to person, place, and time. Vital signs are normal. She appears well-developed and well-nourished. No distress.  HENT:  Head: Normocephalic and atraumatic.  Right Ear: Hearing, tympanic membrane, external ear and ear canal normal.  Left Ear: Hearing, tympanic membrane, external ear and ear canal normal.  Nose: Nose normal. No mucosal edema, rhinorrhea, septal deviation or nasal septal hematoma. Right sinus exhibits no maxillary sinus tenderness and no frontal sinus tenderness. Left sinus exhibits no maxillary sinus tenderness and no frontal sinus tenderness.  Mouth/Throat: Uvula is midline, oropharynx is clear and moist and mucous membranes are normal. No oral lesions. Normal dentition. No dental caries.  Eyes: Conjunctivae, EOM and lids are normal. Pupils are equal, round, and reactive to light. No scleral icterus.  Pt has periodic vision evaluation w/ eyecare professional.  Neck: Normal range of motion. Neck supple. No JVD present. No thyromegaly present.  Cardiovascular: Normal rate, regular rhythm, S1 normal, S2 normal, normal heart sounds and normal pulses.  PMI is not displaced.  Exam reveals no gallop and no friction rub.   No murmur heard. Pulmonary/Chest: Effort normal and breath sounds normal. No respiratory distress. She has no wheezes. Right breast exhibits no inverted nipple,  no mass, no nipple discharge, no skin change and no tenderness. Left breast exhibits no inverted nipple, no mass, no nipple discharge, no skin change and no tenderness. Breasts  are symmetrical.  Abdominal: Soft. Normal appearance and bowel sounds are normal. She exhibits no shifting dullness, no distension, no abdominal bruit, no pulsatile midline mass and no mass. There is no hepatosplenomegaly. There is no tenderness. There is no guarding and no CVA tenderness. No hernia.  Genitourinary: Rectum normal. Rectal exam shows no external hemorrhoid, no fissure, no mass, no tenderness and anal tone normal. Guaiac negative stool. Right adnexum displays no mass, no tenderness and no fullness. Left adnexum displays no mass, no tenderness and no fullness.  NEFG; PAP not performed.  Lymphadenopathy:       Head (right side): No submental, no submandibular, no tonsillar, no posterior auricular and no occipital adenopathy present.       Head (left side): No submental, no submandibular, no tonsillar, no posterior auricular and no occipital adenopathy present.    She has no cervical adenopathy.    She has no axillary adenopathy.       Right: No inguinal and no supraclavicular adenopathy present.       Left: No inguinal and no supraclavicular adenopathy present.  Neurological: She is alert and oriented to person, place, and time. She has normal strength and normal reflexes. She is not disoriented. She displays no atrophy. No cranial nerve deficit or sensory deficit. She exhibits normal muscle tone. Coordination and gait normal.  Skin: Skin is warm and dry. No rash noted. She is not diaphoretic. No erythema. No pallor.  Multiple round hyperpigmented scaly lesions on trunk.  Psychiatric: She has a normal mood and affect. Her speech is normal and behavior is normal. Judgment and thought content normal. Her mood appears not anxious. Her affect is not blunt and not labile. She is not agitated and not withdrawn. Cognition and memory are normal. She does not exhibit a depressed mood. She is attentive.     [facility] reading (PRIMARY) by  Dr. Audria Nine: CXR- normal cardiac size; no active  disease.  Results for orders placed in visit on 11/13/12  IFOBT (OCCULT BLOOD)      Result Value Range   IFOBT Negative         Assessment & Plan:  Routine general medical examination at a health care facility - Plan: Comprehensive metabolic panel, Vitamin D, 25-hydroxy, IFOBT POC (occult bld, rslt in office)  Other and unspecified hyperlipidemia - Plan: Lipid panel  Dyspnea - Suspect deconditioning as pt only experiences this symptom when she climbs stairs at work.  Plan: DG Chest 2 View  Osteoporosis, unspecified -Pt currently on no medical therapy for this problem.     Plan: DG Bone Density

## 2012-11-13 NOTE — Patient Instructions (Addendum)
Keeping You Healthy  Get These Tests  Blood Pressure- Have your blood pressure checked by your healthcare provider at least once a year.  Normal blood pressure is 120/80.  Weight- Have your body mass index (BMI) calculated to screen for obesity.  BMI is a measure of body fat based on height and weight.  You can calculate your own BMI at https://www.west-esparza.com/  Cholesterol- Have your cholesterol checked every year.  Diabetes- Have your blood sugar checked every year if you have high blood pressure, high cholesterol, a family history of diabetes or if you are overweight.  Pap Smear- Have a pap smear every 1 to 3 years if you have been sexually active.  If you are older than 60 and recent pap smears have been normal you may not need additional pap smears.  In addition, if you have had a hysterectomy  For benign disease additional pap smears are not necessary.  Mammogram-Yearly mammograms are essential for early detection of breast cancer  Screening for Colon Cancer- Colonoscopy starting at age 64. Screening may begin sooner depending on your family history and other health conditions.  Follow up colonoscopy as directed by your Gastroenterologist.  Screening for Osteoporosis- Screening begins at age 60 with bone density scanning, sooner if you are at higher risk for developing Osteoporosis.  Get these medicines  Calcium with Vitamin D- Your body requires 1200-1500 mg of Calcium a day and [phone] IU of Vitamin D a day.  You can only absorb 500 mg of Calcium at a time therefore Calcium must be taken in 2 or 3 separate doses throughout the day.  Hormones- Hormone therapy has been associated with increased risk for certain cancers and heart disease.  Talk to your healthcare provider about if you need relief from menopausal symptoms.  Aspirin- Ask your healthcare provider about taking Aspirin to prevent Heart Disease and Stroke.  Get these Immuniztions  Flu shot- Every fall  Pneumonia  shot- Once after the age of 60; if you are younger ask your healthcare provider if you need a pneumonia shot. This is current.  Tetanus- Every ten years.  Zostavax- Once after the age of 60 to prevent shingles. You will be receiving this vaccine at [facility]; please have them fax your immunization record to [place].  Take these steps  Don't smoke- Your healthcare provider can help you quit. For tips on how to quit, ask your healthcare provider or go to www.smokefree.gov or call 1-800 QUIT-NOW.  Be physically active- Exercise 5 days a week for a minimum of 30 minutes.  If you are not already physically active, start slow and gradually work up to 30 minutes of moderate physical activity.  Try walking, dancing, bike riding, swimming, etc.  Eat a healthy diet- Eat a variety of healthy foods such as fruits, vegetables, whole grains, low fat milk, low fat cheeses, yogurt, lean meats, chicken, fish, eggs, dried beans, tofu, etc.  For more information go to www.thenutritionsource.org  Dental visit- Brush and floss teeth twice daily; visit your dentist twice a year.  Eye exam- Visit your Optometrist or Ophthalmologist yearly.  Drink alcohol in moderation- Limit alcohol intake to one drink or less a day.  Never drink and drive.  Depression- Your emotional health is as important as your physical health.  If you're feeling down or losing interest in things you normally enjoy, please talk to your healthcare provider.  Seat Belts- can save your life; always wear one  Smoke/Carbon Monoxide detectors- These detectors need  to be installed on the appropriate level of your home.  Replace batteries at least once a year.  Violence- If anyone is threatening or hurting you, please tell your healthcare provider.  Living Will/ Health care power of attorney- Discuss with your healthcare provider and family.

## 2012-11-16 ENCOUNTER — Encounter: Payer: Self-pay | Admitting: Family Medicine

## 2012-11-17 ENCOUNTER — Encounter: Payer: Self-pay | Admitting: Family Medicine

## 2012-11-20 ENCOUNTER — Other Ambulatory Visit: Payer: Self-pay

## 2012-11-20 DIAGNOSIS — Z1231 Encounter for screening mammogram for malignant neoplasm of breast: Secondary | ICD-10-CM

## 2012-11-25 ENCOUNTER — Encounter: Payer: Self-pay | Admitting: *Deleted

## 2012-12-10 ENCOUNTER — Ambulatory Visit
Admission: RE | Admit: 2012-12-10 | Discharge: 2012-12-10 | Disposition: A | Discharge status: Home / Self Care | Payer: 59 | Source: Ambulatory Visit

## 2012-12-10 ENCOUNTER — Ambulatory Visit
Admission: RE | Admit: 2012-12-10 | Discharge: 2012-12-10 | Disposition: A | Discharge status: Home / Self Care | Payer: 59 | Source: Ambulatory Visit | Attending: Family Medicine | Admitting: Family Medicine

## 2012-12-10 DIAGNOSIS — Z1231 Encounter for screening mammogram for malignant neoplasm of breast: Secondary | ICD-10-CM

## 2012-12-10 DIAGNOSIS — M810 Age-related osteoporosis without current pathological fracture: Secondary | ICD-10-CM

## 2012-12-12 ENCOUNTER — Encounter: Payer: Self-pay | Admitting: Family Medicine

## 2012-12-30 ENCOUNTER — Encounter: Payer: Self-pay | Admitting: Diagnostic Neuroimaging

## 2012-12-30 ENCOUNTER — Ambulatory Visit (INDEPENDENT_AMBULATORY_CARE_PROVIDER_SITE_OTHER): Payer: 59 | Admitting: Diagnostic Neuroimaging

## 2012-12-30 VITALS — BP 106/68 | HR 66 | Ht 64.0 in | Wt 161.0 lb

## 2012-12-30 DIAGNOSIS — F411 Generalized anxiety disorder: Secondary | ICD-10-CM | POA: Insufficient documentation

## 2012-12-30 DIAGNOSIS — G35 Multiple sclerosis: Secondary | ICD-10-CM

## 2012-12-30 DIAGNOSIS — R5383 Other fatigue: Secondary | ICD-10-CM | POA: Insufficient documentation

## 2012-12-30 DIAGNOSIS — R5381 Other malaise: Secondary | ICD-10-CM

## 2012-12-30 NOTE — Patient Instructions (Signed)
I will check MRI brain.  Continue copaxone.

## 2012-12-30 NOTE — Progress Notes (Signed)
[facility]  PATIENT: Katie Maldonado DOB: February 09, 1951  REFERRING CLINICIAN:  HISTORY FROM: patient REASON FOR VISIT: follow up   HISTORICAL  CHIEF COMPLAINT:  Chief Complaint  Patient presents with  . Dr. Sandria Manly pt    HISTORY OF PRESENT ILLNESS:   UPDATE 12/30/12: Since last visit patient continues to have problems with fatigue, insomnia, anxiety, shortness of breath. For past 6 months she's been having dyspnea on exertion especially climbing steps at her work. She has been evaluated by PCP without specific diagnosis. Patient continues on Copaxone (now 3 times per week dosing). Last MRI brain was in 2011. Regarding fatigue she has been on Provigil and Ritalin in the past without relief. Regarding anxiety she was on Paxil in the past but this caused nausea and stomach problems.  PRIOR HPI (05/02/12, Dr. 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 Her MRI of brain 06/10/1999 showed white matter abnormalities not typical for MS and MRI of the cervical spine without contrast 07/27/99 showed a focal area of abnormality in the spinal cord at C2-3. She had positive CSF IgG index and oligoclonal banding. Initially the VER was normal with subsequent VERs 12/04/1999, 12/10/1999, and 02/01/2000 abnormal in the right eye. She  began Avonex which she took for one year and switched to Rebif beginning 08/2000. She was then switched to Betaseron 04/2001 because her insurance would not pay for Rebif, but was subsequently switched back to Rebif 04/2003. Because of elevated liver function tests she was switched to Copaxone 07/03/2005.She has felt better on Copaxone then on the  interferons. Her visual acuity varies from 20/30 to 20/40 in the left eye and  to 20/200 in the right. She had burning paresthesias treated with gabapentin and at times amitriptyline. NMO blood test was   negative 02/21/2005. She has been followed Dr. Corliss Skains, rheumatologist for the  question of RA versus sarcoidosis and  by Dr.Clinton Young for hypersomnolence without a diagnosis of narcolepsy or obstructive Sleep apnea. MRI studies of the brain and cervical spine 02/08/2010 showed periventricular lesions and unchanged lesion at C2-3. CBC and CMP 03/17/2009 were normal. CBC, CMP, TSH, lipid profile except LDL 151 were normal 08/07/09.vitamin D level 10/16/2009 was 41. She complains of fatigue and her right foot turning in an occasional foot cramping. She fell 10/2009 fracturing her right foot in a [employer] parking lot. In 2010  she fractured her left foot, left wrist, and her left arm. She has continued swelling in her right foot and lower leg. She has an overactive bladder followed by Dr.Dalhstedt. She has a history of Lhermitte's sign worse with neck  extension but also present with flexion. She had numbness on the  right side of her body except her face and head for  one month.Her bladder symptoms  improved on enablex.She walks 1.8 miles 3 times per week. 03/05/2011 she was lying in bed asleep and awoke trying to sit up and developed dizziness on 2 different occasions. She fell  back in to the bed and went to sleep. On the third episode she became concerned. The episodes of dizziness would last seconds and are described as spinning. She had  dizziness during the day without spinning She did not have  head trauma or a  cold. She was seen by Dr. Perrin Maltese at urgent care and underwent blood studies, EKG, and urinalysis. She has a history of migraine and  a history of motion sickness. She has a Lhermitte's sign when she turns her head to the left with discomfort in her neck going into her left arm. This can occur 2 times per day and lasts Less than 1 minute. She has bladder symptoms and right flank pain and is concerned about another urinary tract infection. She can awaken with snoring  when she has a cold. She sleeps well.   She has dizziness 2 or 3 times per week when she lies down and turns her head to the left.  REVIEW OF SYSTEMS: Full 14 system review of systems performed and notable only for fatigue ringing in ears itching urination problems diarrhea shortness of breath feeling hot joint pain and anxiety decreased energy dizziness weakness insomnia.  ALLERGIES: Allergies  Allergen Reactions  . Nitrofurantoin Shortness Of Breath    Shortness of breath, violent chills  . Sulfonamide Derivatives Shortness Of Breath    Shortness of breath, chills  . Cefdinir     Can't remember what rx had 2 yrs ago    HOME MEDICATIONS: Prior to Admission medications   Medication Sig Start Date End Date Taking? Authorizing Provider  aspirin EC 81 MG tablet Take 1 tablet (81 mg total) by mouth daily. 04/18/11  Yes Sanjuana Letters, MD  glatiramer (COPAXONE) 20 MG/ML injection Inject 40 mg into the skin 3 (three) times a week.    Yes Historical Provider, MD  ibuprofen (ADVIL,MOTRIN) 200 MG tablet Take 2 tablets (400 mg total) by mouth 4 (four) times daily. 04/18/11  Yes Sanjuana Letters, MD  meclizine (ANTIVERT) 25 MG tablet TAKE 1 TABLET BY MOUTH 3 TIMES DAILY AS NEEDED. 04/01/12  Yes Ryan M Dunn, PA-C  Omega-3 Fatty Acids (FISH OIL) 1000 MG CAPS Take 1,000 mg by mouth 2 (two) times daily.  04/18/11  Yes Sanjuana Letters, MD  ondansetron (ZOFRAN-ODT) 4 MG disintegrating tablet TAKE 1 TABLET BY MOUTH EVERY 6 HOURS AS NEEDED 09/25/11  Yes Ryan M Dunn, PA-C  Red Yeast Rice Extract (RED YEAST RICE PO) Take by mouth 2 (two) times daily.    Yes Historical Provider, MD   Outpatient Prescriptions Prior to Visit  Medication Sig Dispense Refill  . aspirin EC 81 MG tablet Take 1 tablet (81 mg total) by mouth daily.      Marland Kitchen glatiramer (COPAXONE) 20 MG/ML injection Inject 40 mg into the skin 3 (three) times a week.       Marland Kitchen ibuprofen (ADVIL,MOTRIN) 200 MG tablet Take 2 tablets (400 mg total) by mouth 4 (four) times daily.       . meclizine (ANTIVERT) 25 MG tablet TAKE 1 TABLET BY MOUTH 3 TIMES DAILY AS NEEDED.  30 tablet  0  . Omega-3 Fatty Acids (FISH OIL) 1000 MG CAPS Take 1,000 mg by mouth 2 (two) times daily.     0  . ondansetron (ZOFRAN-ODT) 4 MG disintegrating tablet TAKE 1 TABLET BY MOUTH EVERY 6 HOURS AS NEEDED  60 tablet  5  . Red Yeast Rice Extract (RED YEAST RICE PO) Take by mouth 2 (two) times daily.        No facility-administered medications prior to visit.    PAST MEDICAL HISTORY: Past Medical History  Diagnosis Date  . Multiple sclerosis   . Other and unspecified hyperlipidemia   . Obesity, unspecified   . Allergy   . Arthritis   . Neuromuscular disorder   . Osteoporosis   . TIA (transient ischemic attack) 20  . Migraine   .  Hypersomnolence     PAST SURGICAL HISTORY: Past Surgical History  Procedure Laterality Date  . Hernia repair    . Tubal ligation    . Wrist surgery    . Arm surgery left      FAMILY HISTORY: Family History  Problem Relation Age of Onset  . Emphysema Father   . Cancer Brother     bone  . Heart disease Brother   . Cancer Maternal Grandmother   . Heart attack Brother   . Cancer Maternal Grandfather   . Cancer Paternal Grandmother   . Cancer Paternal Grandfather     SOCIAL HISTORY:  History   Social History  . Marital Status: Married    Spouse Name: N/A    Number of Children: 2  . Years of Education: N/A   Occupational History  . PATIENT ACCOUNTING [employer]   Social History Main Topics  . Smoking status: Former Smoker -- 37 years    Types: Cigarettes    Quit date: 02/26/2004  . Smokeless tobacco: Never Used  . Alcohol Use: No  . Drug Use: No  . Sexual Activity: Yes    Birth Control/ Protection: None   Other Topics Concern  . Not on file   Social History Narrative  . No narrative on file     PHYSICAL EXAM  Filed Vitals:   12/30/12 1519  BP: 106/68  Pulse: 66  Height: 5\' 4"  (1.626 m)  Weight: 161 lb (73.029 kg)    Not  recorded    Body mass index is 27.62 kg/(m^2).  GENERAL EXAM: Patient is in no distress  CARDIOVASCULAR: Regular rate and rhythm, no murmurs, no carotid bruits  NEUROLOGIC: MENTAL STATUS: awake, alert, language fluent, comprehension intact, naming intact CRANIAL NERVE: no papilledema on fundoscopic exam, pupils equal and reactive to light, visual fields full to confrontation, extraocular muscles intact, no nystagmus, facial sensation and strength symmetric, uvula midline, shoulder shrug symmetric, tongue midline. MOTOR: normal bulk and tone, full strength in the BUE, BLE SENSORY: normal and symmetric to light touch COORDINATION: finger-nose-finger, fine finger movements normal REFLEXES: deep tendon reflexes present and symmetric GAIT/STATION: narrow based gait; able to walk tandem; romberg is negative   DIAGNOSTIC DATA (LABS, IMAGING, TESTING) - I reviewed patient records, labs, notes, testing and imaging myself where available.  Lab Results  Component Value Date   WBC 6.9 09/16/2011   HGB 13.7 09/16/2011   HCT 40.0 09/16/2011   MCV 86.6 09/16/2011   PLT 275 09/16/2011      Component Value Date/Time   NA 139 11/13/2012 1011   K 4.3 11/13/2012 1011   CL 104 11/13/2012 1011   CO2 27 11/13/2012 1011   GLUCOSE 92 11/13/2012 1011   BUN 12 11/13/2012 1011   CREATININE 0.75 11/13/2012 1011   CREATININE 0.70 12/22/2010 0132   CALCIUM 9.7 11/13/2012 1011   PROT 7.9 11/13/2012 1011   ALBUMIN 5.0 11/13/2012 1011   AST 21 11/13/2012 1011   ALT 20 11/13/2012 1011   ALKPHOS 94 11/13/2012 1011   BILITOT 0.7 11/13/2012 1011   GFRNONAA >60 12/22/2010 0132   GFRAA >60 12/22/2010 0132   Lab Results  Component Value Date   CHOL 243* 11/13/2012   HDL 66 11/13/2012   LDLCALC 156* 11/13/2012   LDLDIRECT 189.0 01/30/2010   TRIG 103 11/13/2012   CHOLHDL 3.7 11/13/2012   No results found for this basename: HGBA1C   No results found for this basename: VITAMINB12   Lab Results  Component Value Date   TSH  1.876 09/16/2011     02/08/10 MRI brain - bilateral tiny periventricular, subcortical and cerebellar white matter hyperintensities which may be compatible with but are not necessarily diagnostic of demyelinating disease. No enhancing lesions are noted.   02/08/10 MRI cervical - prominent spondylitic changes from C4-C7 as stated above with mild canal and left more than right foraminal stenosis.  Illdefined dorsal spinal cord hyperintensity at C2-3 likely represents remote age demyelinating plaque. No enhancing lesions are seen.    ASSESSMENT AND PLAN  62 y.o. year old female here with multiple sclerosis since 2001, on copaxone since 2007. Previously on avonex and betaseron.  PLAN: 1. MRI brain 2. Consider psychiatry treatment of anxiety, insomnia, fatigue  Orders Placed This Encounter  Procedures  . MR Brain W Wo Contrast    Return in about 6 months (around 06/29/2013) for with Heide Guile or Don Tiu.    Suanne Marker, MD 12/30/2012, 4:00 PM Certified in Neurology, Neurophysiology and Neuroimaging  [facility] [address] [place], [place] [address] [phone]

## 2012-12-31 DIAGNOSIS — Z0289 Encounter for other administrative examinations: Secondary | ICD-10-CM

## 2013-01-07 ENCOUNTER — Ambulatory Visit (INDEPENDENT_AMBULATORY_CARE_PROVIDER_SITE_OTHER): Payer: 59 | Admitting: Family Medicine

## 2013-01-07 ENCOUNTER — Ambulatory Visit: Payer: 59 | Admitting: Family Medicine

## 2013-01-07 ENCOUNTER — Encounter: Payer: Self-pay | Admitting: Family Medicine

## 2013-01-07 VITALS — BP 120/80 | HR 71 | Temp 98.0°F | Resp 16 | Ht 64.5 in | Wt 160.0 lb

## 2013-01-07 DIAGNOSIS — F411 Generalized anxiety disorder: Secondary | ICD-10-CM

## 2013-01-07 DIAGNOSIS — R0600 Dyspnea, unspecified: Secondary | ICD-10-CM

## 2013-01-07 DIAGNOSIS — R0609 Other forms of dyspnea: Secondary | ICD-10-CM

## 2013-01-07 MED ORDER — CITALOPRAM HYDROBROMIDE 20 MG PO TABS: ORAL_TABLET | ORAL | Status: DC

## 2013-01-07 NOTE — Patient Instructions (Signed)
Anxiety and Panic Attacks Your caregiver has informed you that you are having an anxiety or panic attack. There may be many forms of this. Most of the time these attacks come suddenly and without warning. They come at any time of day, including periods of sleep, and at any time of life. They may be strong and unexplained. Although panic attacks are very scary, they are physically harmless. Sometimes the cause of your anxiety is not known. Anxiety is a protective mechanism of the body in its fight or flight mechanism. Most of these perceived danger situations are actually nonphysical situations (such as anxiety over losing a job). CAUSES  The causes of an anxiety or panic attack are many. Panic attacks may occur in otherwise healthy people given a certain set of circumstances. There may be a genetic cause for panic attacks. Some medications may also have anxiety as a side effect. SYMPTOMS  Some of the most common feelings are:  Intense terror.  Dizziness, feeling faint.  Hot and cold flashes.  Fear of going crazy.  Feelings that nothing is real.  Sweating.  Shaking.  Chest pain or a fast heartbeat (palpitations).  Smothering, choking sensations.  Feelings of impending doom and that death is near.  Tingling of extremities, this may be from over-breathing.  Altered reality (derealization).  Being detached from yourself (depersonalization). Several symptoms can be present to make up anxiety or panic attacks. DIAGNOSIS  The evaluation by your caregiver will depend on the type of symptoms you are experiencing. The diagnosis of anxiety or panic attack is made when no physical illness can be determined to be a cause of the symptoms. TREATMENT  Treatment to prevent anxiety and panic attacks may include:  Avoidance of circumstances that cause anxiety.  Reassurance and relaxation.  Regular exercise.  Relaxation therapies, such as yoga.  Psychotherapy with a psychiatrist or  therapist.  Avoidance of caffeine, alcohol and illegal drugs.  Prescribed medication. SEEK IMMEDIATE MEDICAL CARE IF:   You experience panic attack symptoms that are different than your usual symptoms.  You have any worsening or concerning symptoms. Document Released: 04/15/2005 Document Revised: 07/08/2011 Document Reviewed: 08/17/2009 [facility] Patient Information 2014 [place], [place].   I have prescribed Citalopram 20 mg tablets for anxiety. Start with 1/2 tablet every morning for 2 weeks then increase to 1 tablet daily. If you feel better on just 1/2 tablet after 2 weeks, then maintain that dose until I see you again in 6 weeks.

## 2013-01-08 ENCOUNTER — Telehealth: Payer: Self-pay

## 2013-01-08 ENCOUNTER — Encounter: Payer: Self-pay | Admitting: Family Medicine

## 2013-01-08 NOTE — Progress Notes (Signed)
S:  This 62 y.o. Cauc female has MS and reports episodic SOB and palpitations. She was evaluated by neurologist who advised that she see PCP for assessment and possible treatment for anxiety. Pt has DOE, especially when climbing stairs. SOB not associated w/ CP or tightness, diaphoresis, HA, dizziness, GI upset, numbness or weakness. She acknowledges a history of anxiety, treated w/ Paxil in the past; pt states this medication did not agree w/ her but she is willing to try another SSRI.  Patient Active Problem List   Diagnosis Date Noted  . Anxiety state, unspecified 12/30/2012  . Other malaise and fatigue 12/30/2012  . Osteoporosis, unspecified 11/13/2012  . Osteoarthritis of multiple joints 04/18/2011  . Incontinence of urine 04/18/2011  . HYPERLIPIDEMIA-MIXED 01/08/2010  . MULTIPLE SCLEROSIS 01/08/2010  . DYSPNEA 01/05/2010   PMHx, Soc Hx and medications reviewed and reconciled.  ROS: As per HPI; otherwise noncontributory.  O: Filed Vitals:   01/07/13 1118  BP: 120/80  Pulse: 71  Temp: 98 F (36.7 C)  Resp: 16   GEN: In NAD; WN,WD. HENT: [place]/AT. EOMI w/ clear conj/sclerae, EACs/nose/oroph unremarkable. COR: RRR. Normal S1 and S2. No m/g/r. LUNGS: CTA; normal resp rate and effort. SKIN: W&D; intact w/o erythema, rashes or pallor. NEURO: A&O x 3; CNs intact. Nonfocal. PSYCH: Pleasant and calm demeanor; speech pattern and thought content normal. Judgement sound.  A/P: Anxiety state, unspecified- This is a chronic problem and has re-emerged as pt contemplates MS prognosis, quality of life issues, etc.  Dyspnea  Meds ordered this encounter  Medications  . citalopram (CELEXA) 20 MG tablet    Sig: Take 1/2 tablet daily for 2 weeks then increase to 1 tablet daily.    Dispense:  30 tablet    Refill:  3

## 2013-01-08 NOTE — Telephone Encounter (Signed)
I called patient and let her know that we need a release in order to fax documents. I faxed a release to her business fax number [phone]. Please return with note to [place] or Lupita Leash to fax your forms and copy and mail a set to you.

## 2013-01-14 ENCOUNTER — Other Ambulatory Visit: Payer: 59

## 2013-01-14 ENCOUNTER — Ambulatory Visit (HOSPITAL_COMMUNITY): Payer: 59

## 2013-03-04 ENCOUNTER — Other Ambulatory Visit: Payer: Self-pay

## 2013-03-04 ENCOUNTER — Telehealth: Payer: Self-pay | Admitting: Diagnostic Neuroimaging

## 2013-03-04 DIAGNOSIS — G35 Multiple sclerosis: Secondary | ICD-10-CM

## 2013-03-04 NOTE — Telephone Encounter (Signed)
Order placed.  [place] office, [place] office.   Called pt and let her know, LMVM (hopefully if needed at other [facility] office, this can be released).

## 2013-03-08 ENCOUNTER — Other Ambulatory Visit: Payer: 59

## 2013-03-08 DIAGNOSIS — G35 Multiple sclerosis: Secondary | ICD-10-CM

## 2013-03-08 NOTE — Addendum Note (Signed)
Addended byHermenia Fiscal on: 03/08/2013 12:32 PM   Modules accepted: Orders

## 2013-03-09 ENCOUNTER — Ambulatory Visit (HOSPITAL_BASED_OUTPATIENT_CLINIC_OR_DEPARTMENT_OTHER)
Admission: RE | Admit: 2013-03-09 | Discharge: 2013-03-09 | Disposition: A | Discharge status: Home / Self Care | Payer: 59 | Source: Ambulatory Visit | Attending: Diagnostic Neuroimaging | Admitting: Diagnostic Neuroimaging

## 2013-03-09 ENCOUNTER — Other Ambulatory Visit: Payer: 59

## 2013-03-09 ENCOUNTER — Other Ambulatory Visit: Payer: Self-pay

## 2013-03-09 DIAGNOSIS — G35 Multiple sclerosis: Secondary | ICD-10-CM | POA: Insufficient documentation

## 2013-03-09 DIAGNOSIS — R5381 Other malaise: Secondary | ICD-10-CM

## 2013-03-09 DIAGNOSIS — M47812 Spondylosis without myelopathy or radiculopathy, cervical region: Secondary | ICD-10-CM | POA: Insufficient documentation

## 2013-03-09 DIAGNOSIS — F411 Generalized anxiety disorder: Secondary | ICD-10-CM

## 2013-03-09 DIAGNOSIS — E785 Hyperlipidemia, unspecified: Secondary | ICD-10-CM | POA: Insufficient documentation

## 2013-03-09 MED ORDER — GADOBENATE DIMEGLUMINE 529 MG/ML IV SOLN
14.0000 mL | Freq: Once | INTRAVENOUS | Status: AC | PRN
  Administered 2013-03-09: 14 mL via INTRAVENOUS

## 2013-03-10 ENCOUNTER — Ambulatory Visit: Payer: 59

## 2013-03-10 DIAGNOSIS — G35 Multiple sclerosis: Secondary | ICD-10-CM

## 2013-03-10 LAB — CREATININE, SERUM
Creatinine, Ser: 0.8 mg/dL (ref 0.57–1.00)
Creatinine, Ser: 0.8 mg/dL (ref 0.4–1.2)
GFR calc Af Amer: 93 mL/min/{1.73_m2} (ref >59)
GFR calc non Af Amer: 80 mL/min/{1.73_m2} (ref >59)

## 2013-03-10 LAB — BUN
BUN: 13 mg/dL (ref 8–27)
BUN: 15 mg/dL (ref 6–23)

## 2013-03-12 NOTE — Telephone Encounter (Signed)
This encounter was created in error - please disregard.

## 2013-03-19 ENCOUNTER — Encounter: Payer: Self-pay | Admitting: Family Medicine

## 2013-03-19 ENCOUNTER — Ambulatory Visit (INDEPENDENT_AMBULATORY_CARE_PROVIDER_SITE_OTHER): Payer: 59 | Admitting: Family Medicine

## 2013-03-19 VITALS — BP 128/64 | HR 70 | Temp 98.1°F | Resp 16 | Ht 64.25 in | Wt 157.8 lb

## 2013-03-19 DIAGNOSIS — Z1159 Encounter for screening for other viral diseases: Secondary | ICD-10-CM

## 2013-03-19 DIAGNOSIS — J019 Acute sinusitis, unspecified: Secondary | ICD-10-CM

## 2013-03-19 DIAGNOSIS — F411 Generalized anxiety disorder: Secondary | ICD-10-CM

## 2013-03-19 DIAGNOSIS — E785 Hyperlipidemia, unspecified: Secondary | ICD-10-CM

## 2013-03-19 MED ORDER — CITALOPRAM HYDROBROMIDE 20 MG PO TABS: ORAL_TABLET | ORAL | Status: DC

## 2013-03-19 MED ORDER — DOXYCYCLINE HYCLATE 100 MG PO TABS: 100.0000 mg | ORAL_TABLET | Freq: Two times a day (BID) | ORAL | Status: DC

## 2013-03-19 NOTE — Progress Notes (Deleted)
S:  This 62 y.o. Cauc female has fever, sore throat, yellow blood-tinged nasal discharge

## 2013-03-19 NOTE — Progress Notes (Signed)
S:  This 62 y.o. Cauc female return for cholesterol recheck. She prefers not to take statin drugs; she is taking 2 grams of Fish Oil and Red Yeast Rice Extract. Nutrition modification practiced also.  She c/o nasal congestion w/ blood-tinged yellow discharge and low grade fever. Co-workers are ill also. She has a mildly prod cough but no SOB or CP or wheezing.  Chronic anxiety is controlled on Citalopram w/o adverse effects. Pt reports less SOB and palpitations. She feels calmer and is resting better. Concentration is better.  Patient Active Problem List   Diagnosis Date Noted  . Anxiety state, unspecified 12/30/2012  . Other malaise and fatigue 12/30/2012  . Osteoporosis, unspecified 11/13/2012  . Osteoarthritis of multiple joints 04/18/2011  . Incontinence of urine 04/18/2011  . HYPERLIPIDEMIA-MIXED 01/08/2010  . MULTIPLE SCLEROSIS 01/08/2010  . DYSPNEA 01/05/2010   PMHx, Surg Hx , Soc and Fam Hx reviewed.  Medications reconciled.  ROS: AS per HPI; otherwise noncontributory.  O: Filed Vitals:   03/19/13 1605  BP: 128/64  Pulse: 70  Temp: 98.1 F (36.7 C)  Resp: 16   GEN: In NAD: WN,WD. HEENT: [place]/AT; EOMI w/ clear conj/sclerae. EACs normal w/ clear canals. Tms w/ chronic scarring and mild erythema. Post ph erythematous w/o exudate or lesions. Sinuses slightly tender w/ percussion. NECK: Supple w/o LAN. COR: RRR; normal S1 and S2. No m/g/r. LUNGS: CTA; no wheezes, rales or rhonchi. SKIN: W&D; intact w/o rashes or pallor. NEURO: A&O x 3; CNs intact. Nonfocal.  A/P: HYPERLIPIDEMIA-MIXED - Plan: LDL Cholesterol, Direct  Acute sinusitis- RX: Doxycycline 100 mg 1 tablet BID. Try air purifier in bedroom. Try OTC nasal mist (AYR brand).  Anxiety state, unspecified- Stable; continue Citalopram 20 mg 1 tab daily as prescribed.  Need for hepatitis C screening test - Plan: Hepatitis C antibody  Meds ordered this encounter  Medications  . Ascorbic Acid (VITAMIN C) 100 MG tablet     Sig: Take 100 mg by mouth daily.  . phenylephrine (SUDAFED PE) 10 MG TABS tablet    Sig: Take 10 mg by mouth every 4 (four) hours as needed.  . citalopram (CELEXA) 20 MG tablet    Sig: Take 1/2 tablet daily for 2 weeks then increase to 1 tablet daily.    Dispense:  30 tablet    Refill:  5  . doxycycline (VIBRA-TABS) 100 MG tablet    Sig: Take 1 tablet (100 mg total) by mouth 2 (two) times daily.    Dispense:  20 tablet    Refill:  0

## 2013-03-19 NOTE — Patient Instructions (Addendum)

## 2013-03-24 ENCOUNTER — Encounter: Payer: Self-pay | Admitting: Family Medicine

## 2013-04-13 ENCOUNTER — Encounter: Payer: Self-pay | Admitting: Family Medicine

## 2013-04-13 NOTE — Telephone Encounter (Signed)
I was going to reply to this and advise her to come in, but want you to advise, since she was recently treated by you for sinusitis (nov) please advise.

## 2013-04-15 ENCOUNTER — Ambulatory Visit (INDEPENDENT_AMBULATORY_CARE_PROVIDER_SITE_OTHER): Payer: 59 | Admitting: Family Medicine

## 2013-04-15 VITALS — BP 110/64 | HR 76 | Temp 98.2°F | Resp 18 | Ht 64.5 in | Wt 157.0 lb

## 2013-04-15 DIAGNOSIS — J029 Acute pharyngitis, unspecified: Secondary | ICD-10-CM

## 2013-04-15 DIAGNOSIS — R05 Cough: Secondary | ICD-10-CM

## 2013-04-15 DIAGNOSIS — J0100 Acute maxillary sinusitis, unspecified: Secondary | ICD-10-CM

## 2013-04-15 DIAGNOSIS — R509 Fever, unspecified: Secondary | ICD-10-CM

## 2013-04-15 LAB — POCT CBC
Granulocyte percent: 66.5 %G (ref 37–80)
MCV: 93.1 fL (ref 80–97)
MID (cbc): 0.6 (ref 0–0.9)
MPV: 8.1 fL (ref 0–99.8)
POC Granulocyte: 3.5 (ref 2–6.9)
POC LYMPH PERCENT: 22.7 %L (ref 10–50)
Platelet Count, POC: 184 10*3/uL (ref 142–424)
RBC: 4.61 M/uL (ref 4.04–5.48)
RDW, POC: 14.6 %

## 2013-04-15 LAB — POCT INFLUENZA A/B: Influenza B, POC: NEGATIVE

## 2013-04-15 MED ORDER — FLUTICASONE PROPIONATE 50 MCG/ACT NA SUSP: 2.0000 | Freq: Every day | NASAL | Status: DC

## 2013-04-15 MED ORDER — AMOXICILLIN 500 MG PO CAPS: 1000.0000 mg | ORAL_CAPSULE | Freq: Two times a day (BID) | ORAL | Status: DC

## 2013-04-15 NOTE — Progress Notes (Signed)
Subjective:    Patient ID: Katie Maldonado, female    DOB: 08/01/1950, 62 y.o.   MRN: [id number]  Cough Associated symptoms include chills, a fever, headaches, postnasal drip, rhinorrhea and a sore throat. Pertinent negatives include no ear pain, rash, shortness of breath or wheezing.  Sore Throat  Associated symptoms include congestion, coughing, diarrhea and headaches. Pertinent negatives include no ear pain, shortness of breath, stridor, trouble swallowing or vomiting.  Fever  Associated symptoms include congestion, coughing, diarrhea, headaches and a sore throat. Pertinent negatives include no ear pain, nausea, rash, vomiting or wheezing.   This 62 y.o. female presents for evaluation of fever, cough, congestion.  Onset one week ago.  Fever four days ago Tmax 101.  +chills.  +HA; sinus congestion.  +ST worsening for past 48 hours.  +rhinorrhea yellow and bloody; +PND.  Taking Mucinex-D.  +coughing at night mostly.  No SOB.  +diarrhea.  No n/v.  Ibuprofen.  Sudafed without relief.  Afrin use bid.    Review of Systems  Constitutional: Positive for fever, chills and fatigue. Negative for diaphoresis.  HENT: Positive for congestion, postnasal drip, rhinorrhea, sinus pressure, sore throat and voice change. Negative for ear pain and trouble swallowing.   Respiratory: Positive for cough. Negative for shortness of breath, wheezing and stridor.   Gastrointestinal: Positive for diarrhea. Negative for nausea and vomiting.  Skin: Negative for rash.  Neurological: Positive for headaches.   Past Medical History  Diagnosis Date  . Multiple sclerosis   . Other and unspecified hyperlipidemia   . Obesity, unspecified   . Allergy   . Arthritis   . Neuromuscular disorder   . Osteoporosis   . TIA (transient ischemic attack) 20  . Migraine   . Hypersomnolence    Allergies  Allergen Reactions  . Nitrofurantoin Shortness Of Breath    Shortness of breath, violent chills  . Sulfonamide  Derivatives Shortness Of Breath    Shortness of breath, chills  . Cefdinir     Can't remember what rx had 2 yrs ago   Current Outpatient Prescriptions on File Prior to Visit  Medication Sig Dispense Refill  . aspirin EC 81 MG tablet Take 1 tablet (81 mg total) by mouth daily.      . citalopram (CELEXA) 20 MG tablet Take 1/2 tablet daily for 2 weeks then increase to 1 tablet daily.  30 tablet  5  . doxycycline (VIBRA-TABS) 100 MG tablet Take 1 tablet (100 mg total) by mouth 2 (two) times daily.  20 tablet  0  . glatiramer (COPAXONE) 20 MG/ML injection Inject 40 mg into the skin 3 (three) times a week.       Marland Kitchen ibuprofen (ADVIL,MOTRIN) 200 MG tablet Take 2 tablets (400 mg total) by mouth 4 (four) times daily.      . meclizine (ANTIVERT) 25 MG tablet TAKE 1 TABLET BY MOUTH 3 TIMES DAILY AS NEEDED.  30 tablet  0  . Omega-3 Fatty Acids (FISH OIL) 1000 MG CAPS Take 1,000 mg by mouth 2 (two) times daily.     0  . ondansetron (ZOFRAN-ODT) 4 MG disintegrating tablet TAKE 1 TABLET BY MOUTH EVERY 6 HOURS AS NEEDED  60 tablet  5  . Red Yeast Rice Extract (RED YEAST RICE PO) Take by mouth 2 (two) times daily.       . Ascorbic Acid (VITAMIN C) 100 MG tablet Take 100 mg by mouth daily.      . phenylephrine (SUDAFED PE) 10 MG TABS  tablet Take 10 mg by mouth every 4 (four) hours as needed.       No current facility-administered medications on file prior to visit.   History   Social History  . Marital Status: Married    Spouse Name: N/A    Number of Children: 2  . Years of Education: N/A   Occupational History  . PATIENT ACCOUNTING [employer]   Social History Main Topics  . Smoking status: Former Smoker -- 37 years    Types: Cigarettes    Quit date: 02/26/2004  . Smokeless tobacco: Never Used  . Alcohol Use: No  . Drug Use: No  . Sexual Activity: Yes    Birth Control/ Protection: None   Other Topics Concern  . Not on file   Social History Narrative  . No narrative on file          Objective:   Physical Exam  Nursing note and vitals reviewed. Constitutional: She is oriented to person, place, and time. She appears well-developed and well-nourished. No distress.  HENT:  Head: Normocephalic and atraumatic.  Right Ear: External ear normal.  Left Ear: External ear normal.  Nose: Mucosal edema and rhinorrhea present. Right sinus exhibits maxillary sinus tenderness. Right sinus exhibits no frontal sinus tenderness. Left sinus exhibits maxillary sinus tenderness. Left sinus exhibits no frontal sinus tenderness.  Mouth/Throat: Oropharynx is clear and moist.  Eyes: Conjunctivae and EOM are normal. Pupils are equal, round, and reactive to light.  Neck: Normal range of motion. Neck supple. No thyromegaly present.  Cardiovascular: Normal rate, regular rhythm and normal heart sounds.  Exam reveals no gallop and no friction rub.   No murmur heard. Pulmonary/Chest: Effort normal and breath sounds normal. She has no wheezes. She has no rales.  Lymphadenopathy:    She has no cervical adenopathy.  Neurological: She is alert and oriented to person, place, and time.  Skin: She is not diaphoretic.  Psychiatric: She has a normal mood and affect. Her behavior is normal.   Results for orders placed in visit on 04/15/13  POCT CBC      Result Value Range   WBC 5.3  4.6 - 10.2 K/uL   Lymph, poc 1.2  0.6 - 3.4   POC LYMPH PERCENT 22.7  10 - 50 %L   MID (cbc) 0.6  0 - 0.9   POC MID % 10.8  0 - 12 %M   POC Granulocyte 3.5  2 - 6.9   Granulocyte percent 66.5  37 - 80 %G   RBC 4.61  4.04 - 5.48 M/uL   Hemoglobin 13.0  12.2 - 16.2 g/dL   HCT, POC 40.9  81.1 - 47.9 %   MCV 93.1  80 - 97 fL   MCH, POC 28.2  27 - 31.2 pg   MCHC 30.3 (*) 31.8 - 35.4 g/dL   RDW, POC 91.4     Platelet Count, POC 184  142 - 424 K/uL   MPV 8.1  0 - 99.8 fL  POCT INFLUENZA A/B      Result Value Range   Influenza A, POC Negative     Influenza B, POC Negative         Assessment & Plan:  Cough - Plan: POCT  CBC, POCT Influenza A/B  Fever - Plan: POCT CBC, POCT Influenza A/B  Sore throat - Plan: POCT CBC, POCT Influenza A/B  Acute maxillary sinusitis  1.  Acute maxillary sinusitis:  New. Rx for Amoxicillin and Flonase provided;  recommend continuing Afrin and Mucinex D.   2.  URI:  New. Etiology to sinusitis.    Meds ordered this encounter  Medications  . DISCONTD: amoxicillin (AMOXIL) 500 MG capsule    Sig: Take 2 capsules (1,000 mg total) by mouth 2 (two) times daily.    Dispense:  40 capsule    Refill:  0  . fluticasone (FLONASE) 50 MCG/ACT nasal spray    Sig: Place 2 sprays into both nostrils daily.    Dispense:  16 g    Refill:  6  . amoxicillin (AMOXIL) 500 MG capsule    Sig: Take 2 capsules (1,000 mg total) by mouth 2 (two) times daily.    Dispense:  40 capsule    Refill:  0

## 2013-04-15 NOTE — Patient Instructions (Signed)

## 2013-05-06 ENCOUNTER — Other Ambulatory Visit: Payer: Self-pay | Admitting: Family Medicine

## 2013-05-27 ENCOUNTER — Encounter: Payer: Self-pay | Admitting: Family Medicine

## 2013-05-27 ENCOUNTER — Ambulatory Visit (INDEPENDENT_AMBULATORY_CARE_PROVIDER_SITE_OTHER): Payer: 59 | Admitting: Family Medicine

## 2013-05-27 VITALS — BP 122/70 | HR 80 | Temp 98.4°F | Resp 16 | Ht 65.0 in | Wt 159.0 lb

## 2013-05-27 DIAGNOSIS — R5381 Other malaise: Secondary | ICD-10-CM

## 2013-05-27 DIAGNOSIS — R1011 Right upper quadrant pain: Secondary | ICD-10-CM

## 2013-05-27 DIAGNOSIS — R5383 Other fatigue: Secondary | ICD-10-CM

## 2013-05-27 LAB — HEPATIC FUNCTION PANEL
ALT: 15 U/L (ref 0–35)
AST: 21 U/L (ref 0–37)
Albumin: 4.5 g/dL (ref 3.5–5.2)
Alkaline Phosphatase: 76 U/L (ref 39–117)
BILIRUBIN DIRECT: 0.1 mg/dL (ref 0.0–0.3)
Indirect Bilirubin: 0.6 mg/dL (ref 0.2–1.2)
Total Bilirubin: 0.7 mg/dL (ref 0.2–1.2)
Total Protein: 7.2 g/dL (ref 6.0–8.3)

## 2013-05-27 LAB — T4, FREE: FREE T4: 1.05 ng/dL (ref 0.80–1.80)

## 2013-05-27 LAB — T3, FREE: T3, Free: 2.8 pg/mL (ref 2.3–4.2)

## 2013-05-27 LAB — TSH: TSH: 1.554 u[IU]/mL (ref 0.350–4.500)

## 2013-05-27 MED ORDER — CITALOPRAM HYDROBROMIDE 20 MG PO TABS: 20.0000 mg | ORAL_TABLET | Freq: Every day | ORAL | Status: DC

## 2013-05-27 NOTE — Progress Notes (Signed)
S:  This 63 y.o. Cauc female has MS; she presents w/ 5-day hx of RUQ pain radiating around back to lower scapula. SHe has a hx of shingles on the R flank area but no lesions are visible. She c/o fatigue but denies fever/chills, n/v and stools are lighter in color. She denies trauma, CP or tightness, palpitations, SOB or ODE, cough, HA, asymmetric numbness or weakness.  Patient Active Problem List   Diagnosis Date Noted  . Anxiety state, unspecified 12/30/2012  . Other malaise and fatigue 12/30/2012  . Osteoporosis, unspecified 11/13/2012  . Osteoarthritis of multiple joints 04/18/2011  . Incontinence of urine 04/18/2011  . HYPERLIPIDEMIA-MIXED 01/08/2010  . MULTIPLE SCLEROSIS 01/08/2010  . DYSPNEA 01/05/2010   PMHx, Surg Hx, Soc and Fam Hx reviewed.  MEDICATIONS reconciled.  ROS: As per HPI.  O: Filed Vitals:   05/27/13 1000  BP: 122/70  Pulse: 80  Temp: 98.4 F (36.9 C)  Resp: 16   GEN: In NAD: WN,WD. Does not appear ill or sickly. HENT: [place]/AT. EOMI w/ clear conj/sclerae. EACs/ TMs/post pharynx normal. NECK: Supple w/o LAN or TMG. BACK: No CVAT. Spine straight and nontender. ABD: Normal appearance and normal BS; Murphy's sign +. Minimal guarding. No HSM or masses. SKIN: W&D; ntact w/o diaphoresis, erythema, jaundice or pallor. NEURO: A&O x 3; CNs intact. Nonfocal.  A/P: RUQ abdominal pain - R/O Gallbladder disease.  Plan: Hepatic Function Panel, US Abdomen Limited RUQ  Other malaise and fatigue - Plan: TSH, T4, Free, T3, Free

## 2013-05-27 NOTE — Patient Instructions (Signed)
Cholecystitis Cholecystitis is an inflammation of your gallbladder. It is usually caused by a buildup of gallstones or sludge (cholelithiasis) in your gallbladder. The gallbladder stores a fluid that helps digest fats (bile). Cholecystitis is serious and needs treatment right away.  CAUSES   Gallstones. Gallstones can block the tube that leads to your gallbladder, causing bile to build up. As bile builds up, the gallbladder becomes inflamed.  Bile duct problems, such as blockage from scarring or kinking.  Tumors. Tumors can stop bile from leaving your gallbladder correctly, causing bile to build up. As bile builds up, the gallbladder becomes inflamed. SYMPTOMS   Nausea.  Vomiting.  Abdominal pain, especially in the upper right area of your abdomen.  Abdominal tenderness or bloating.  Sweating.  Chills.  Fever.  Yellowing of the skin and the whites of the eyes (jaundice). DIAGNOSIS  Your caregiver may order blood tests to look for infection or gallbladder problems. Your caregiver may also order imaging tests, such as an ultrasound or computed tomography (CT) scan. Further tests may include a hepatobiliary iminodiacetic acid (HIDA) scan. This scan allows your caregiver to see your bile move from the liver to the gallbladder and to the small intestine. TREATMENT  A hospital stay is usually necessary to lessen the inflammation of your gallbladder. You may be required to not eat or drink (fast) for a certain amount of time. You may be given medicine to treat pain or an antibiotic medicine to treat an infection. Surgery may be needed to remove your gallbladder (cholecystectomy) once the inflammation has gone down. Surgery may be needed right away if you develop complications such as death of gallbladder tissue (gangrene) or a tear (perforation) of the gallbladder.  [place] care will depend on your treatment. In general:  If you were given antibiotics, take them as  directed. Finish them even if you start to feel better.  Only take over-the-counter or prescription medicines for pain, discomfort, or fever as directed by your caregiver.  Follow a low-fat diet until you see your caregiver again.  Keep all follow-up visits as directed by your caregiver. SEEK IMMEDIATE MEDICAL CARE IF:   Your pain is increasing and not controlled by medicines.  Your pain moves to another part of your abdomen or to your back.  You have a fever.  You have nausea and vomiting. MAKE SURE YOU:  Understand these instructions.  Will watch your condition.  Will get help right away if you are not doing well or get worse. Document Released: 04/15/2005 Document Revised: 07/08/2011 Document Reviewed: 03/01/2011 [facility] Patient Information 2014 [place], [place].   The clinic will contact you with the results of the ultrasound and your labs. If you feel suddenly worse, seek care at the Emergency Department or Urgent Care.

## 2013-05-28 ENCOUNTER — Encounter: Payer: Self-pay | Admitting: Family Medicine

## 2013-05-28 NOTE — Telephone Encounter (Signed)
I called pt's mobile phone number to check on her status today and to let her know that all her labs are normal. No answer; I left a message.

## 2013-05-31 ENCOUNTER — Telehealth: Payer: Self-pay | Admitting: *Deleted

## 2013-05-31 ENCOUNTER — Ambulatory Visit
Admission: RE | Admit: 2013-05-31 | Discharge: 2013-05-31 | Disposition: A | Discharge status: Home / Self Care | Payer: 59 | Source: Ambulatory Visit | Attending: Family Medicine | Admitting: Family Medicine

## 2013-05-31 DIAGNOSIS — R1011 Right upper quadrant pain: Secondary | ICD-10-CM

## 2013-05-31 NOTE — Telephone Encounter (Signed)
Good Morning Dr. Leward Quan I had my ultrasound done this morning. I am still having tenderness and discomfort in my abdoman and side/back areas I don't feel it is as bad as it was last week but it is still there. I feel sure you will figure out what it is and we can get rid of it.

## 2013-06-01 ENCOUNTER — Encounter: Payer: Self-pay | Admitting: Family Medicine

## 2013-06-01 NOTE — Telephone Encounter (Signed)
Thanks. Will put in referral if patient agrees.

## 2013-06-01 NOTE — Telephone Encounter (Signed)
I left a message for pt advising of [place] result and unchanged cystic liver lesion present since 2009. I have advised GI evaluation and asked pt to call [phone] clinical if she agrees to this referral.

## 2013-06-03 ENCOUNTER — Encounter: Payer: Self-pay | Admitting: Family Medicine

## 2013-06-09 NOTE — Telephone Encounter (Signed)
Left a message for patient to return call.

## 2013-06-11 NOTE — Telephone Encounter (Signed)
Patient has not returned calls.

## 2013-06-18 ENCOUNTER — Ambulatory Visit (INDEPENDENT_AMBULATORY_CARE_PROVIDER_SITE_OTHER): Payer: 59 | Admitting: Family Medicine

## 2013-06-18 ENCOUNTER — Other Ambulatory Visit (INDEPENDENT_AMBULATORY_CARE_PROVIDER_SITE_OTHER): Payer: 59

## 2013-06-18 ENCOUNTER — Encounter: Payer: Self-pay | Admitting: Family Medicine

## 2013-06-18 VITALS — BP 118/70 | HR 77 | Temp 97.8°F | Resp 16 | Wt 159.1 lb

## 2013-06-18 DIAGNOSIS — M719 Bursopathy, unspecified: Secondary | ICD-10-CM

## 2013-06-18 DIAGNOSIS — M67919 Unspecified disorder of synovium and tendon, unspecified shoulder: Secondary | ICD-10-CM

## 2013-06-18 DIAGNOSIS — R200 Anesthesia of skin: Secondary | ICD-10-CM

## 2013-06-18 DIAGNOSIS — M159 Polyosteoarthritis, unspecified: Secondary | ICD-10-CM

## 2013-06-18 DIAGNOSIS — R209 Unspecified disturbances of skin sensation: Secondary | ICD-10-CM

## 2013-06-18 DIAGNOSIS — M7550 Bursitis of unspecified shoulder: Secondary | ICD-10-CM

## 2013-06-18 MED ORDER — MELOXICAM 15 MG PO TABS: 15.0000 mg | ORAL_TABLET | Freq: Every day | ORAL | Status: DC

## 2013-06-18 NOTE — Patient Instructions (Signed)
Very good to see you Start Vitamin D at 4000 IU daily.  Consider Turmeric 500mg  twice daily.  Fish oil 2 grams daily.  Try to do exercises most days of the week.  Meloxicam daily for 10 days then as needed.  Staying active and doing 320-30 minutes of cardio most days of the week has been shown to decrease flares of MS.  Come back in 3 weeks.

## 2013-06-18 NOTE — Assessment & Plan Note (Signed)
We did discuss over-the-counter medications he can be beneficial.

## 2013-06-18 NOTE — Assessment & Plan Note (Signed)
Patient did have significant improvement after injection with stating that numbness is completely resolved. I am not completely convinced though that this numbness is coming from the shoulder for sure. Differential also includes the potential central core compression of the C4-C5 but with radiation going down to her wrist this would mean lower levels would be involved. Patient does given home exercises for her shoulder as well as range of motion of her neck. Discussed over-the-counter medications he can be beneficial. Discussed icing protocol. Patient Wells Guiles 3 weeks for further evaluation. If she is not doing better we need to consider further imaging for her neck versus starting gabapentin for symptom relief.

## 2013-06-18 NOTE — Progress Notes (Signed)
[facility] [address] [place], [place] [address] Phone: [phone] Subjective:     CC: Left arm numbness and tingling  [id number] [name] is a 63 y.o. female coming in with complaint of complaint of left arm numbness and tingling. Patient does have a past medical history significant for multiple sclerosis that has been in remission for years. Patient states this has been on the left side it has been going on for approximately 3 weeks. Patient states that she notices it more when she is sitting. Patient denies any weakness in the hand but states that the pain can shoot from but seems to be her neck down to her wrist. Patient denies any nighttime awakening. Patient has not tried any home activities. Patient has had exacerbations of her multiple sclerosis and states that this feels significantly different. Patient has not noticed any association with certain movements. Patient rates the severity of 7/10. Reviewing patient's records patient does have possible cord compression at the C4-C5 level. Patient has had this for multiple years.    Past medical history, social, surgical and family history all reviewed in electronic medical record.   Review of Systems: No headache, visual changes, nausea, vomiting, diarrhea, constipation, dizziness, abdominal pain, skin rash, fevers, chills, night sweats, weight loss, swollen lymph nodes, body aches, joint swelling, muscle aches, chest pain, shortness of breath, mood changes.   Objective Blood pressure 118/70, pulse 77, temperature 97.8 F (36.6 C), temperature source Oral, resp. rate 16, weight 159 lb 1.3 oz (72.158 kg), SpO2 99.00%.  General: No apparent distress alert and oriented x3 mood and affect normal, dressed appropriately.  HEENT: Pupils equal, extraocular movements intact  Respiratory: Patient's speak in full sentences and does not appear short of breath  Cardiovascular: No lower extremity  edema, non tender, no erythema  Skin: Warm dry intact with no signs of infection or rash on extremities or on axial skeleton.  Abdomen: Soft nontender  Neuro: Cranial nerves II through XII are intact, neurovascularly intact in all extremities with 2+ DTRs and 2+ pulses.  Lymph: No lymphadenopathy of posterior or anterior cervical chain or axillae bilaterally.  Gait normal with good balance and coordination.  MSK:  Non tender with full range of motion and good stability and symmetric strength and tone of  elbows, wrist, hip, knee and ankles bilaterally.  Neck: Inspection unremarkable. No palpable stepoffs. Negative Spurling's maneuver. Does have mild decreased range of motion in extension lacking the last 5 as well as left-sided side bending by Eddie Dibbles lacking the last 5. Grip strength and sensation normal in bilateral hands Strength good C4 to T1 distribution No sensory change to C4 to T1 Negative Hoffman sign bilaterally Reflexes normal Shoulder: Left Inspection reveals no abnormalities, atrophy or asymmetry. Palpation is normal with no tenderness over AC joint or bicipital groove. ROM is full in all planes. Rotator cuff strength normal throughout. signs of impingement with positive Neer and Hawkin's tests, negative empty can sign. Speeds and Yergason's tests normal. No labral pathology noted with negative Obrien's, negative clunk and good stability. Normal scapular function observed. No painful arc and no drop arm sign. No apprehension sign Contralateral shoulder unremarkable  MSK US performed of: Left shoulder This study was ordered, performed, and interpreted by Charlann Boxer D.O.  Shoulder:   Supraspinatus:  Appears normal on long and transverse views, no bursal bulge seen with shoulder abduction on impingement view. Mild bursitis noted Infraspinatus:  Appears normal on long and transverse views.  Mild bursitis noted Subscapularis:  Appears normal on long and transverse  views. Teres Minor:  Appears normal on long and transverse views. AC joint:  Capsule undistended, no geyser sign. Osteoarthritic changes Glenohumeral Joint:  Appears normal without effusion. Mild osteoarthritic changes Glenoid Labrum:  Intact without visualized tears. Biceps Tendon:  Appears normal on long and transverse views, no fraying of tendon, tendon located in intertubercular groove, no subluxation with shoulder internal or external rotation. Facet hypoechoic changes surrounding the tendon. No increased power doppler signal.  Impression: Shoulder bursitis with underlying osteoarthritic changes.  Procedure: Real-time Ultrasound Guided Injection of left glenohumeral joint Device: GE Logiq E  Ultrasound guided injection is preferred based studies that show increased duration, increased effect, greater accuracy, decreased procedural pain, increased response rate with ultrasound guided versus blind injection.  Verbal informed consent obtained.  Time-out conducted.  Noted no overlying erythema, induration, or other signs of local infection.  Skin prepped in a sterile fashion.  Local anesthesia: Topical Ethyl chloride.  With sterile technique and under real time ultrasound guidance:  Joint visualized.  23g 1  inch needle inserted posterior approach. Pictures taken for needle placement. Patient did have injection of 2 cc of 1% lidocaine, 2 cc of 0.5% Marcaine, and 1cc of Kenalog 40 mg/dL. Completed without difficulty  Pain immediately resolved suggesting accurate placement of the medication.  Advised to call if fevers/chills, erythema, induration, drainage, or persistent bleeding.  Images permanently stored and available for review in the ultrasound unit.  Impression: Technically successful ultrasound guided injection.      Impression and Recommendations:     This case required medical decision making of moderate complexity.

## 2013-06-18 NOTE — Progress Notes (Signed)
Pre visit review using our clinic review tool, if applicable. No additional management support is needed unless otherwise documented below in the visit note. 

## 2013-06-30 ENCOUNTER — Ambulatory Visit (INDEPENDENT_AMBULATORY_CARE_PROVIDER_SITE_OTHER): Payer: 59 | Admitting: Nurse Practitioner

## 2013-06-30 ENCOUNTER — Telehealth: Payer: Self-pay | Admitting: Diagnostic Neuroimaging

## 2013-06-30 ENCOUNTER — Encounter: Payer: Self-pay | Admitting: Nurse Practitioner

## 2013-06-30 VITALS — BP 124/77 | HR 66 | Ht 64.5 in | Wt 159.0 lb

## 2013-06-30 DIAGNOSIS — R202 Paresthesia of skin: Secondary | ICD-10-CM

## 2013-06-30 DIAGNOSIS — G35 Multiple sclerosis: Secondary | ICD-10-CM

## 2013-06-30 DIAGNOSIS — R209 Unspecified disturbances of skin sensation: Secondary | ICD-10-CM

## 2013-06-30 DIAGNOSIS — R200 Anesthesia of skin: Secondary | ICD-10-CM

## 2013-06-30 MED ORDER — GLATIRAMER ACETATE 40 MG/ML ~~LOC~~ SOSY: 40.0000 mg | PREFILLED_SYRINGE | SUBCUTANEOUS | Status: DC

## 2013-06-30 MED ORDER — GLATIRAMER ACETATE 20 MG/ML ~~LOC~~ KIT: 40.0000 mg | PACK | SUBCUTANEOUS | Status: DC

## 2013-06-30 NOTE — Telephone Encounter (Signed)
I called the pahrmacy back at the number left multiple times, but the line was busy each time.  Rx was sent for Copaxone 20mg , with directions of inject 40mg  three times weekly.  I have updated the Rx to Copaxone 40mg  injecting 40mg  three times weekly.  New Rx has been sent.  I was not able to get through to them on the number left.

## 2013-06-30 NOTE — Progress Notes (Signed)
PATIENT: Katie Maldonado DOB: Jul 03, 1950   REASON FOR VISIT: follow up for MS HISTORY FROM: patient  HISTORY OF PRESENT ILLNESS: UPDATE 06/30/13 (LL):  Since last visit patient continues to have problems with fatigue and malaise, numbness and paresthesias in left arm which is new for her, started a couple weeks ago.  She states it is constant, does not hurt, but annoying.  She is still able to work.  Follow up MRI brain at last visit showed no acute or new lesions, with moderate cerebral atrophy.  UPDATE 12/30/12 (VP): Since last visit patient continues to have problems with fatigue, insomnia, anxiety, shortness of breath. For past 6 months she's been having dyspnea on exertion especially climbing steps at her work. She has been evaluated by PCP without specific diagnosis. Patient continues on Copaxone (now 3 times per week dosing). Last MRI brain was in 2011. Regarding fatigue she has been on Provigil and Ritalin in the past without relief. Regarding anxiety she was on Paxil in the past but this caused nausea and stomach problems.   PRIOR HPI (05/02/12, Dr. 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 Her MRI of brain 06/10/1999 showed white matter abnormalities not typical for MS and MRI of the cervical spine without contrast 07/27/99 showed a focal area of abnormality in the spinal cord at C2-3. She had positive CSF IgG index and oligoclonal banding. Initially the VER was normal with subsequent VERs 12/04/1999, 12/10/1999, and 02/01/2000 abnormal in the right eye. She began Avonex which she took for one year and switched to Rebif beginning 08/2000. She was then switched to Betaseron 04/2001 because her insurance would not pay for Rebif, but was subsequently switched back to Rebif 04/2003. Because of elevated liver function tests she was switched to Copaxone 07/03/2005.She has felt better on  Copaxone then on the interferons. Her visual acuity varies from 20/30 to 20/40 in the left eye and to 20/200 in the right. She had burning paresthesias treated with gabapentin and at times amitriptyline. NMO blood test was negative 02/21/2005. She has been followed Dr. Estanislado Pandy, rheumatologist for the question of RA versus sarcoidosis and by Dr.Clinton Young for hypersomnolence without a diagnosis of narcolepsy or obstructive Sleep apnea. MRI studies of the brain and cervical spine 02/08/2010 showed periventricular lesions and unchanged lesion at C2-3. CBC and CMP 03/17/2009 were normal. CBC, CMP, TSH, lipid profile except LDL 151 were normal 08/07/09.vitamin D level 10/16/2009 was 41. She complains of fatigue and her right foot turning in an occasional foot cramping. She fell 10/2009 fracturing her right foot in a [employer] parking lot. In 2010 she fractured her left foot, left wrist, and her left arm. She has continued swelling in her right foot and lower leg. She has an overactive bladder followed by Dr.Dalhstedt. She has a history of Lhermitte's sign worse with neck extension but also present with flexion. She had numbness on the right side of her body except her face and head for one month.Her bladder symptoms improved on enablex.She walks 1.8 miles 3 times per week. 03/05/2011 she was lying in bed asleep and awoke trying to sit up and developed dizziness on 2 different occasions. She fell back in to the bed and went to sleep. On the third episode she became concerned. The episodes of dizziness would last seconds and are described as spinning. She had dizziness during the day without spinning She did  not have head trauma or a cold. She was seen by Dr. Elder Cyphers at urgent care and underwent blood studies, EKG, and urinalysis. She has a history of migraine and a history of motion sickness. She has a Lhermitte's sign when she turns her head to the left with discomfort in her neck going into her left arm. This can occur 2  times per day and lasts Less than 1 minute. She has bladder symptoms and right flank pain and is concerned about another urinary tract infection. She can awaken with snoring when she has a cold. She sleeps well. She has dizziness 2 or 3 times per week when she lies down and turns her head to the left.   REVIEW OF SYSTEMS: Full 14 system review of systems performed and notable only for fatigue ringing in ears itching urination problems nausea, shortness of breath, feeling hot, joint pain, and anxiety, decreased energy, dizziness, weakness, frequent waking and daytime sleepiness.   ALLERGIES: Allergies  Allergen Reactions  . Nitrofurantoin Shortness Of Breath    Shortness of breath, violent chills  . Sulfonamide Derivatives Shortness Of Breath    Shortness of breath, chills  . Cefdinir     Can't remember what rx had 2 yrs ago    HOME MEDICATIONS: Outpatient Prescriptions Prior to Visit  Medication Sig Dispense Refill  . aspirin EC 81 MG tablet Take 1 tablet (81 mg total) by mouth daily.      . citalopram (CELEXA) 20 MG tablet Take 1 tablet (20 mg total) by mouth daily.  30 tablet  5  . ibuprofen (ADVIL,MOTRIN) 200 MG tablet Take 2 tablets (400 mg total) by mouth 4 (four) times daily.      . meclizine (ANTIVERT) 25 MG tablet TAKE 1 TABLET BY MOUTH 3 TIMES DAILY AS NEEDED.  30 tablet  0  . Omega-3 Fatty Acids (FISH OIL) 1000 MG CAPS Take 1,000 mg by mouth 2 (two) times daily.     0  . ondansetron (ZOFRAN-ODT) 4 MG disintegrating tablet TAKE 1 TABLET BY MOUTH EVERY 6 HOURS AS NEEDED  60 tablet  5  . Red Yeast Rice Extract (RED YEAST RICE PO) Take by mouth 2 (two) times daily.       Marland Kitchen glatiramer (COPAXONE) 20 MG/ML injection Inject 40 mg into the skin 3 (three) times a week.       . Ascorbic Acid (VITAMIN C) 100 MG tablet Take 100 mg by mouth daily.      Marland Kitchen doxycycline (VIBRA-TABS) 100 MG tablet Take 1 tablet (100 mg total) by mouth 2 (two) times daily.  20 tablet  0  . meloxicam (MOBIC) 15 MG  tablet Take 1 tablet (15 mg total) by mouth daily.  30 tablet  0   No facility-administered medications prior to visit.   PHYSICAL EXAM  Filed Vitals:   06/30/13 1325  BP: 124/77  Pulse: 66  Height: 5' 4.5" (1.638 m)  Weight: 159 lb (72.122 kg)   Body mass index is 26.88 kg/(m^2).  Physical Exam  General: Patient is awake, alert and in no acute distress. Well developed and groomed.  Neck: Neck is supple.  Cardiovascular: No carotid artery bruits. Heart is regular rate and rhythm with no murmurs.   Neurologic Exam  Mental Status: Awake, alert. Language is fluent and comprehension intact. Voice soft.  Cranial Nerves: Pupils are equal and reactive to light. Visual fields are full to confrontation. Conjugate eye movements are full and symmetric. Facial sensation and strength are symmetric.  Hearing is intact. Palate elevated symmetrically and uvula is midline. Shoulder shrug is symmetric. Tongue is midline.  POSITIVE MYERSON'S.  Motor: Normal bulk and tone. NO TREMOR. MILD RIGIDITY IN BUE. BRADYKINESIA IN LUE> RUE; LLE > RLE. Full strength in the upper and lower extremities. No pronator drift.  Sensory: Intact and symmetric to light touch.  Coordination: No ataxia or dysmetria on finger-nose or rapid alternating movement testing.  Gait and Station:  Normal stance, rising from chair without assistance, smooth turns, able to tandem and toe walk. Reflexes: Deep tendon reflexes in the UPPER EXT TRACE; ABSENT AT KNEES AND ANKLES.   DIAGNOSTIC DATA (LABS, IMAGING, TESTING) - I reviewed patient records, labs, notes, testing and imaging myself where available.  Lab Results  Component Value Date   TSH 1.554 05/27/2013  02/08/10 MRI brain - bilateral tiny periventricular, subcortical and cerebellar white matter hyperintensities which may be compatible with but are not necessarily diagnostic of demyelinating disease. No enhancing lesions are noted.   02/08/10 MRI cervical - prominent spondylitic  changes from C4-C7 as stated above with mild canal and left more than right foraminal stenosis. Illdefined dorsal spinal cord hyperintensity at C2-3 likely represents remote age demyelinating plaque. No enhancing lesions are seen.   03/09/13 MRI Brain W/Wo - Findings consistent with chronic multiple sclerosis. No new lesions since 2007. No white matter abnormalities displaying restricted diffusion or abnormal postcontrast enhancement. Moderate cerebral atrophy. Small vessel disease superimposed not excluded. Inspissated secretions left nasal cavity, compounding what was probably a large left middle turbinate concha bullosa.  ASSESSMENT AND PLAN 63 y.o. year old female here with multiple sclerosis since 2001, on copaxone since 2007. Previously on avonex and betaseron.  Complains of long-standing fatigue and hypersolemnolence.  She is experiencing mild left arm numbness and paresthesias for the last couple weeks, new for her.  PLAN: 1. Continue Copaxone. 2. MRI cervical spine to evaluate MS and left arm numbness and paresthesias, depending on result, consider steroids if MS exacerbation. 3. Nuvigill 250 mg samples given for fatigue/malaise, 2 packs 3. Follow up in 6 months.   Orders Placed This Encounter  Procedures  . MR Cervical Spine W Wo Contrast   Return in about 6 months (around 12/31/2013).  Philmore Pali, MSN, NP-C 06/30/2013, 5:36 PM [facility] [address] [place], [place] [address] [phone]  Note: This document was prepared with digital dictation and possible smart phrase technology. Any transcriptional errors that result from this process are unintentional.

## 2013-06-30 NOTE — Patient Instructions (Signed)
PLAN: 1. Continue Copaxone. 2. MRI cervical spine to evaluate MS and left arm and paresthesias. 3. Follow up in 6 months.

## 2013-06-30 NOTE — Telephone Encounter (Signed)
Monica at [facility] just received the new prescription for Ms. Marvel Plan.  She wants to get some clarification on the dosage.  Please call her at [phone].  Thank you

## 2013-07-02 NOTE — Progress Notes (Signed)
I reviewed note and agree with plan.   Penni Bombard, MD 0/10/6806, 8:11 PM Certified in Neurology, Neurophysiology and Neuroimaging  [facility] [address], [place], [place] [address] [phone]

## 2013-07-07 ENCOUNTER — Ambulatory Visit (INDEPENDENT_AMBULATORY_CARE_PROVIDER_SITE_OTHER): Payer: 59

## 2013-07-07 DIAGNOSIS — R202 Paresthesia of skin: Secondary | ICD-10-CM

## 2013-07-07 DIAGNOSIS — R200 Anesthesia of skin: Secondary | ICD-10-CM

## 2013-07-07 DIAGNOSIS — G35 Multiple sclerosis: Secondary | ICD-10-CM

## 2013-07-07 DIAGNOSIS — R209 Unspecified disturbances of skin sensation: Secondary | ICD-10-CM

## 2013-07-07 MED ORDER — GADOPENTETATE DIMEGLUMINE 469.01 MG/ML IV SOLN: 15.0000 mL | Freq: Once | INTRAVENOUS | Status: AC | PRN

## 2013-07-07 NOTE — Telephone Encounter (Signed)
Pt came in for her visit closing encounter °

## 2013-07-09 ENCOUNTER — Ambulatory Visit (INDEPENDENT_AMBULATORY_CARE_PROVIDER_SITE_OTHER): Payer: 59 | Admitting: Family Medicine

## 2013-07-09 ENCOUNTER — Encounter: Payer: Self-pay | Admitting: Family Medicine

## 2013-07-09 VITALS — BP 120/66 | HR 77 | Temp 98.4°F | Resp 16 | Wt 160.0 lb

## 2013-07-09 DIAGNOSIS — R200 Anesthesia of skin: Secondary | ICD-10-CM | POA: Insufficient documentation

## 2013-07-09 DIAGNOSIS — R202 Paresthesia of skin: Principal | ICD-10-CM

## 2013-07-09 DIAGNOSIS — R209 Unspecified disturbances of skin sensation: Secondary | ICD-10-CM

## 2013-07-09 MED ORDER — GABAPENTIN 100 MG PO CAPS: 200.0000 mg | ORAL_CAPSULE | Freq: Every day | ORAL | Status: DC

## 2013-07-09 NOTE — Progress Notes (Signed)
Pre visit review using our clinic review tool, if applicable. No additional management support is needed unless otherwise documented below in the visit note. 

## 2013-07-09 NOTE — Patient Instructions (Signed)
Good to see you Continue the exercises for your shoulder 3 times a week Start neck exercises 3 times a week Try gabapentin at night.  Start with one pill for 1 week then increase to 2 pills nightly thereafter.   Lets have you come back in 3 weeks to make sure you are doing better.

## 2013-07-09 NOTE — Assessment & Plan Note (Addendum)
Patient's left arm numbness is likely secondary to mild cord compression that we see at the multiple levels. Patient's symptoms correspond to a C5 through C7 compression which correlates well with the MRI. We discussed different diagnostic imaging studies they can give [place] more benefit or other treatment options including epidural steroid injections but we decided a more conservative therapy. Patient will do gabapentin and we'll titrate up for a nighttime does. Patient has had some fatigue and malaise during the day so avoid daytime dosing at this junction. The patient come back again in 3 weeks after titration. Patient will also start a home exercise program times a week for her neck. Spent greater than 25 minutes with patient face-to-face and had greater than 50% of counseling including as described above in assessment and plan.

## 2013-07-09 NOTE — Progress Notes (Signed)
  [facility] [address], [place] [address] Phone: [phone] Subjective:     CC: Left arm numbness and tingling follow up  JKD:[id number] Katie Maldonado is a 63 y.o. female coming in with complaint of complaint of left shoulder pain as well as numbness and tingling going down the upper extremity. Patient was seen previously and did have an injection into her shoulder for a shoulder bursitis seen on ultrasound. Patient states In addition this patient continue to have the numbness in the upper extremity went to her neurologist. I did order an MRI. The MRI was reviewed by me today. Patient is showed progression of severe degenerative disc disease at multiple levels with some mild central cord compression of C4-5, C5-6, C6-7 patient is still having the numbness going down her arm..   Past medical history, social, surgical and family history all reviewed in electronic medical record.   Review of Systems: No headache, visual changes, nausea, vomiting, diarrhea, constipation, dizziness, abdominal pain, skin rash, fevers, chills, night sweats, weight loss, swollen lymph nodes, body aches, joint swelling, muscle aches, chest pain, shortness of breath, mood changes.   Objective Blood pressure 120/66, pulse 77, temperature 98.4 F (36.9 C), temperature source Oral, resp. rate 16, weight 160 lb (72.576 kg), SpO2 97.00%.  General: No apparent distress alert and oriented x3 mood and affect normal, dressed appropriately.  HEENT: Pupils equal, extraocular movements intact  Respiratory: Patient's speak in full sentences and does not appear short of breath  Cardiovascular: No lower extremity edema, non tender, no erythema  Skin: Warm dry intact with no signs of infection or rash on extremities or on axial skeleton.  Abdomen: Soft nontender  Neuro: Cranial nerves II through XII are intact, neurovascularly intact in all extremities with 2+ DTRs and 2+ pulses.    Lymph: No lymphadenopathy of posterior or anterior cervical chain or axillae bilaterally.  Gait normal with good balance and coordination.  MSK:  Non tender with full range of motion and good stability and symmetric strength and tone of  elbows, wrist, hip, knee and ankles bilaterally.  Neck: Inspection unremarkable. No palpable stepoffs. Negative Spurling's maneuver. Does have mild decreased range of motion in extension lacking the last 5 as well as left-sided side bending Grip strength and sensation normal in bilateral hands Strength good C4 to T1 distribution No sensory change to C4 to T1 Negative Hoffman sign bilaterally Reflexes normal Shoulder: Left Inspection reveals no abnormalities, atrophy or asymmetry. Palpation is normal with no tenderness over AC joint or bicipital groove. ROM is full in all planes. Rotator cuff strength normal throughout. signs of impingement with positive Neer and Hawkin's tests, negative empty can sign. Speeds and Yergason's tests normal. No labral pathology noted with negative Obrien's, negative clunk and good stability. Normal scapular function observed. No painful arc and no drop arm sign. No apprehension sign Contralateral shoulder unremarkable      Impression and Recommendations:     This case required medical decision making of moderate complexity.

## 2013-07-14 ENCOUNTER — Encounter: Payer: Self-pay | Admitting: *Deleted

## 2013-07-15 ENCOUNTER — Telehealth: Payer: Self-pay | Admitting: Diagnostic Neuroimaging

## 2013-07-15 NOTE — Telephone Encounter (Signed)
Called pt back to inform her about her MRI results. I advised the pt that if she has any other problems, questions or concerns to call the office. Pt verbalized understanding.

## 2013-07-15 NOTE — Telephone Encounter (Signed)
Pt returned Cathy's call.  She stated it was about results from her recent visit.  Please call her at [phone] and press [phone] for the operator.  Thank you

## 2013-08-23 ENCOUNTER — Other Ambulatory Visit: Payer: Self-pay | Admitting: Physician Assistant

## 2013-08-23 ENCOUNTER — Other Ambulatory Visit: Payer: Self-pay

## 2013-08-23 NOTE — Telephone Encounter (Signed)
Patient of Dr Leward Quan. Says her Zofran rx was denied and not sure why. Says she can't go thru the day without it. 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

## 2013-08-24 MED ORDER — ONDANSETRON 4 MG PO TBDP: ORAL_TABLET | ORAL | Status: DC

## 2013-08-24 NOTE — Telephone Encounter (Signed)
This was denied, because she needs appointment but looks like she was here in January, it is unclear what your follow up plan is, please advise. Pended meds.

## 2013-08-24 NOTE — Telephone Encounter (Signed)
Zofran refill signed. I advised pt to return to clinic if symptoms persist. Her chronic nausea may be related to MS and associated medications.

## 2013-09-13 ENCOUNTER — Encounter: Payer: Self-pay | Admitting: Family Medicine

## 2013-09-15 ENCOUNTER — Other Ambulatory Visit: Payer: Self-pay | Admitting: *Deleted

## 2013-09-15 DIAGNOSIS — M5412 Radiculopathy, cervical region: Secondary | ICD-10-CM

## 2013-09-24 ENCOUNTER — Other Ambulatory Visit: Payer: 59

## 2013-09-28 ENCOUNTER — Encounter: Payer: Self-pay | Admitting: Family Medicine

## 2013-11-12 ENCOUNTER — Ambulatory Visit
Admission: RE | Admit: 2013-11-12 | Discharge: 2013-11-12 | Disposition: A | Discharge status: Home / Self Care | Payer: 59 | Source: Ambulatory Visit | Attending: Family Medicine | Admitting: Family Medicine

## 2013-11-12 DIAGNOSIS — M5412 Radiculopathy, cervical region: Secondary | ICD-10-CM

## 2013-11-12 MED ORDER — IOHEXOL 300 MG/ML  SOLN
1.0000 mL | Freq: Once | INTRAMUSCULAR | Status: AC | PRN
  Administered 2013-11-12: 1 mL via EPIDURAL

## 2013-11-12 MED ORDER — TRIAMCINOLONE ACETONIDE 40 MG/ML IJ SUSP (RADIOLOGY)
60.0000 mg | Freq: Once | INTRAMUSCULAR | Status: AC
  Administered 2013-11-12: 60 mg via EPIDURAL

## 2013-11-12 NOTE — Discharge Instructions (Signed)

## 2013-11-26 ENCOUNTER — Ambulatory Visit (INDEPENDENT_AMBULATORY_CARE_PROVIDER_SITE_OTHER): Payer: 59 | Admitting: Family Medicine

## 2013-11-26 ENCOUNTER — Ambulatory Visit (INDEPENDENT_AMBULATORY_CARE_PROVIDER_SITE_OTHER)
Admission: RE | Admit: 2013-11-26 | Discharge: 2013-11-26 | Disposition: A | Discharge status: Home / Self Care | Payer: 59 | Source: Ambulatory Visit | Attending: Family Medicine | Admitting: Family Medicine

## 2013-11-26 ENCOUNTER — Encounter: Payer: Self-pay | Admitting: Family Medicine

## 2013-11-26 VITALS — BP 112/74 | HR 66 | Ht 65.0 in | Wt 166.0 lb

## 2013-11-26 DIAGNOSIS — M545 Low back pain, unspecified: Secondary | ICD-10-CM | POA: Insufficient documentation

## 2013-11-26 MED ORDER — TRAMADOL HCL 50 MG PO TABS: 50.0000 mg | ORAL_TABLET | Freq: Every evening | ORAL | Status: DC | PRN

## 2013-11-26 MED ORDER — MELOXICAM 15 MG PO TABS: 15.0000 mg | ORAL_TABLET | Freq: Every day | ORAL | Status: DC

## 2013-11-26 NOTE — Assessment & Plan Note (Addendum)
Patient's low back pain does not seem to be bony related. No radicular symptoms noted. Patient does have poor core strengthening discussed home exercise program. We discussed icing protocol and over-the-counter medications that could be beneficial.  Patient did have x-rays today they were reviewed by me. Patient does have mild osteophytic changes of the lumbar spine but overall unremarkable.  Patient try these interventions and come back and see me again in 3-4 weeks.  Spent greater than 25 minutes with patient face-to-face and had greater than 50% of counseling including as described above in assessment and plan.

## 2013-11-26 NOTE — Patient Instructions (Signed)
It is good to see you.  Vitamin D 2000 IU daily.  Turmeric 500mg  twice daily.  Get xrays downstairs.  Try meloxicam daily stop the advil.  Tramadol at night if needed Exercises 3 times a week.

## 2013-11-26 NOTE — Progress Notes (Signed)
[facility] [place], [place] [address] Phone: [phone] Subjective:     CC: Left arm numbness and tingling follow up  [id number] Katie Maldonado is a 63 y.o. female coming in with complaint of complaint of left shoulder pain as well as numbness and tingling going down the upper extremity with history of multiple sclerosis. Patient has had this pain for quite some time. Patient had an MRI showing progression of severe degenerative disc disease at multiple levels with some mild central cord compression of C4-5, C5-6, C6-7 but no sclerotic lesions.  Patient has failed all other conservative therapy. On the 17th, 2 weeks ago patient was given an epidural steroid injection at the C7-T1. Patient states her neck pain is 95% better after the injection. Patient has been able to move it without any significant pain. Patient is fairly happy with the results.  Patient is now complaining of a new problem of low back pain. Patient states it is worse after sitting for a long amount of time in trying to stand overlaying in trying to get up in the morning. Patient states it isn't severe pain but catches her breath and and seems to alleviate. Denies any radiation down her legs any numbness or weakness. Patient denies any bowel or bladder incontinence or any fevers chills or any abnormal weight loss. Describes the pain as 10 out of 10 but only last minutes. Patient has been taking ibuprofen on a regular basis of about 800 mg 4 times daily.     .   Past medical history, social, surgical and family history all reviewed in electronic medical record.   Review of Systems: No headache, visual changes, nausea, vomiting, diarrhea, constipation, dizziness, abdominal pain, skin rash, fevers, chills, night sweats, weight loss, swollen lymph nodes, body aches, joint swelling, muscle aches, chest pain, shortness of breath, mood changes.   Objective Blood pressure  112/74, pulse 66, height 5\' 5"  (1.651 m), weight 166 lb (75.297 kg), SpO2 99.00%.  General: No apparent distress alert and oriented x3 mood and affect normal, dressed appropriately.  HEENT: Pupils equal, extraocular movements intact  Respiratory: Patient's speak in full sentences and does not appear short of breath  Cardiovascular: No lower extremity edema, non tender, no erythema  Skin: Warm dry intact with no signs of infection or rash on extremities or on axial skeleton.  Abdomen: Soft nontender  Neuro: Cranial nerves II through XII are intact, neurovascularly intact in all extremities with 2+ DTRs and 2+ pulses.  Lymph: No lymphadenopathy of posterior or anterior cervical chain or axillae bilaterally.  Gait normal with good balance and coordination.  MSK:  Non tender with full range of motion and good stability and symmetric strength and tone of  elbows, wrist, hip, knee and ankles bilaterally.  Neck: Inspection unremarkable. No palpable stepoffs. Negative Spurling's maneuver. Does have mild decreased range of motion in extension lacking the last 5 but otherwise full range of motion  Grip strength and sensation normal in bilateral hands Strength good C4 to T1 distribution No sensory change to C4 to T1 Negative Hoffman sign bilaterally Reflexes normal Back Exam:  Inspection: Unremarkable  Motion: Flexion 45 deg, Extension 45 deg, Side Bending to 45 deg bilaterally,  Rotation to 45 deg bilaterally  SLR laying: Negative  XSLR laying: Negative  Palpable tenderness: Mild tenderness in the paraspinal musculature of the lumbar spine bilaterally. FABER: negative. Sensory change: Gross sensation intact to all lumbar and sacral dermatomes.  Reflexes:  2+ at both patellar tendons, 2+ at achilles tendons, Babinski's downgoing.  Strength at foot  Plantar-flexion: 5/5 Dorsi-flexion: 5/5 Eversion: 5/5 Inversion: 5/5  Leg strength  Quad: 5/5 Hamstring: 5/5 Hip flexor: 5/5 Hip abductors: 4/5    Gait unremarkable.       Impression and Recommendations:     This case required medical decision making of moderate complexity.

## 2014-01-04 ENCOUNTER — Encounter: Payer: Self-pay | Admitting: Diagnostic Neuroimaging

## 2014-01-04 ENCOUNTER — Ambulatory Visit (INDEPENDENT_AMBULATORY_CARE_PROVIDER_SITE_OTHER): Payer: 59 | Admitting: Diagnostic Neuroimaging

## 2014-01-04 VITALS — BP 132/88 | HR 67 | Temp 97.3°F | Ht 65.5 in | Wt 170.4 lb

## 2014-01-04 DIAGNOSIS — R5383 Other fatigue: Secondary | ICD-10-CM

## 2014-01-04 DIAGNOSIS — R5381 Other malaise: Secondary | ICD-10-CM

## 2014-01-04 DIAGNOSIS — G35 Multiple sclerosis: Secondary | ICD-10-CM

## 2014-01-04 NOTE — Patient Instructions (Signed)
Continue copaxone.

## 2014-01-04 NOTE — Progress Notes (Signed)
PATIENT: Katie Maldonado DOB: [birth year]   REASON FOR VISIT: follow up for MS HISTORY FROM: patient  HISTORY OF PRESENT ILLNESS:  UPDATE 01/04/14 (VRP): Since last visit, doing the same. No new neuro symptoms. Does report persistent fatigue, weight gain, incr appetite, feeling hot. Tolerating copaxone. Misses 2-3 injections per month.   UPDATE 06/30/13 (LL):  Since last visit patient continues to have problems with fatigue and malaise, numbness and paresthesias in left arm which is new for her, started a couple weeks ago.  She states it is constant, does not hurt, but annoying.  She is still able to work.  Follow up MRI brain at last visit showed no acute or new lesions, with moderate cerebral atrophy.  UPDATE 12/30/12 (VP): Since last visit patient continues to have problems with fatigue, insomnia, anxiety, shortness of breath. For past 6 months she's been having dyspnea on exertion especially climbing steps at her work. She has been evaluated by PCP without specific diagnosis. Patient continues on Copaxone (now 3 times per week dosing). Last MRI brain was in 2011. Regarding fatigue she has been on Provigil and Ritalin in the past without relief. Regarding anxiety she was on Paxil in the past but this caused nausea and stomach problems.   PRIOR HPI (05/02/12, Dr. 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 Her MRI of brain 06/10/1999 showed white matter abnormalities not typical for MS and MRI of the cervical spine without contrast 07/27/99 showed a focal area of abnormality in the spinal cord at C2-3. She had positive CSF IgG index and oligoclonal banding. Initially the VER was normal with subsequent VERs 12/04/1999, 12/10/1999, and 02/01/2000 abnormal in the right eye. She began Avonex which she took for one year and switched to Rebif beginning 08/2000. She was then switched to  Betaseron 04/2001 because her insurance would not pay for Rebif, but was subsequently switched back to Rebif 04/2003. Because of elevated liver function tests she was switched to Copaxone 07/03/2005.She has felt better on Copaxone then on the interferons. Her visual acuity varies from 20/30 to 20/40 in the left eye and to 20/200 in the right. She had burning paresthesias treated with gabapentin and at times amitriptyline. NMO blood test was negative 02/21/2005. She has been followed Dr. Estanislado Pandy, rheumatologist for the question of RA versus sarcoidosis and by Dr.Clinton Young for hypersomnolence without a diagnosis of narcolepsy or obstructive Sleep apnea. MRI studies of the brain and cervical spine 02/08/2010 showed periventricular lesions and unchanged lesion at C2-3. CBC and CMP 03/17/2009 were normal. CBC, CMP, TSH, lipid profile except LDL 151 were normal 08/07/09.vitamin D level 10/16/2009 was 41. She complains of fatigue and her right foot turning in an occasional foot cramping. She fell 10/2009 fracturing her right foot in a [employer] parking lot. In 2010 she fractured her left foot, left wrist, and her left arm. She has continued swelling in her right foot and lower leg. She has an overactive bladder followed by Dr.Dalhstedt. She has a history of Lhermitte's sign worse with neck extension but also present with flexion. She had numbness on the right side of her body except her face and head for one month.Her bladder symptoms improved on enablex.She walks 1.8 miles 3 times per week. 03/05/2011 she was lying in bed asleep and awoke trying to sit up and developed dizziness on 2 different occasions. She fell back in to the bed  and went to sleep. On the third episode she became concerned. The episodes of dizziness would last seconds and are described as spinning. She had dizziness during the day without spinning She did not have head trauma or a cold. She was seen by Dr. Elder Cyphers at urgent care and underwent blood studies,  EKG, and urinalysis. She has a history of migraine and a history of motion sickness. She has a Lhermitte's sign when she turns her head to the left with discomfort in her neck going into her left arm. This can occur 2 times per day and lasts Less than 1 minute. She has bladder symptoms and right flank pain and is concerned about another urinary tract infection. She can awaken with snoring when she has a cold. She sleeps well. She has dizziness 2 or 3 times per week when she lies down and turns her head to the left.   REVIEW OF SYSTEMS: Full 14 system review of systems performed and notable only for as per HPI.    ALLERGIES: Allergies  Allergen Reactions  . Nitrofurantoin Shortness Of Breath    Shortness of breath, violent chills  . Sulfonamide Derivatives Shortness Of Breath    Shortness of breath, chills  . Cefdinir     Can't remember what rx had 2 yrs ago    HOME MEDICATIONS: Outpatient Prescriptions Prior to Visit  Medication Sig Dispense Refill  . aspirin EC 81 MG tablet Take 1 tablet (81 mg total) by mouth daily.      . citalopram (CELEXA) 20 MG tablet Take 1 tablet (20 mg total) by mouth daily.  30 tablet  5  . gabapentin (NEURONTIN) 100 MG capsule Take 2 capsules (200 mg total) by mouth at bedtime.  60 capsule  3  . Glatiramer Acetate (COPAXONE) 40 MG/ML SOSY Inject 40 mg into the skin 3 (three) times a week.  12 Syringe  5  . ibuprofen (ADVIL,MOTRIN) 200 MG tablet Take 2 tablets (400 mg total) by mouth 4 (four) times daily.      . meclizine (ANTIVERT) 25 MG tablet TAKE 1 TABLET BY MOUTH 3 TIMES DAILY AS NEEDED.  30 tablet  0  . meloxicam (MOBIC) 15 MG tablet Take 1 tablet (15 mg total) by mouth daily.  30 tablet  3  . Omega-3 Fatty Acids (FISH OIL) 1000 MG CAPS Take 1,000 mg by mouth 2 (two) times daily.     0  . ondansetron (ZOFRAN-ODT) 4 MG disintegrating tablet TAKE 1 TABLET BY MOUTH EVERY 6 HOURS AS NEEDED  60 tablet  0  . Red Yeast Rice Extract (RED YEAST RICE PO) Take by  mouth 2 (two) times daily.       . traMADol (ULTRAM) 50 MG tablet Take 1 tablet (50 mg total) by mouth at bedtime as needed.  30 tablet  0   No facility-administered medications prior to visit.   PHYSICAL EXAM  Filed Vitals:   01/04/14 1433  BP: 132/88  Pulse: 67  Temp: 97.3 F (36.3 C)  TempSrc: Oral  Height: 5' 5.5" (1.664 m)  Weight: 170 lb 6.4 oz (77.293 kg)   Body mass index is 27.91 kg/(m^2).  Physical Exam  General: Patient is awake, alert and in no acute distress. Well developed and groomed.  Neck: Neck is supple.  Cardiovascular: No carotid artery bruits. Heart is regular rate and rhythm with no murmurs.   Neurologic Exam  Mental Status: Awake, alert. Language is fluent and comprehension intact. Voice soft.  Cranial Nerves:  Pupils are equal and reactive to light. Visual fields are full to confrontation. Conjugate eye movements are full and symmetric. Facial sensation and strength are symmetric. Hearing is intact. Palate elevated symmetrically and uvula is midline. Shoulder shrug is symmetric. Tongue is midline.    Motor: Normal bulk and tone. NO TREMOR. MILD RIGIDITY IN BUE. NO BRADYKINESIA. Full strength in the upper and lower extremities. No pronator drift.  Sensory: Intact and symmetric to light touch.  Coordination: No ataxia or dysmetria on finger-nose or rapid alternating movement testing.  Gait and Station:  Normal stance, rising from chair without assistance, smooth turns, able to tandem and toe walk. Reflexes: Deep tendon reflexes in the UPPER EXT TRACE; ABSENT AT KNEES AND ANKLES.    DIAGNOSTIC DATA (LABS, IMAGING, TESTING) - I reviewed patient records, labs, notes, testing and imaging myself where available.  Lab Results  Component Value Date   TSH 1.554 05/27/2013   02/08/10 MRI brain - bilateral tiny periventricular, subcortical and cerebellar white matter hyperintensities which may be compatible with but are not necessarily diagnostic of demyelinating  disease. No enhancing lesions are noted.   02/08/10 MRI cervical - prominent spondylitic changes from C4-C7 as stated above with mild canal and left more than right foraminal stenosis. Illdefined dorsal spinal cord hyperintensity at C2-3 likely represents remote age demyelinating plaque. No enhancing lesions are seen.   03/09/13 MRI Brain - Findings consistent with chronic multiple sclerosis. No new lesions since 2007. No white matter abnormalities displaying restricted diffusion or abnormal postcontrast enhancement. Moderate cerebral atrophy. Small vessel disease superimposed not excluded. Inspissated secretions left nasal cavity, compounding what was probably a large left middle turbinate concha bullosa.  07/08/13 MRI cervical spine (with and without) demonstrating:  1. At C4-5, C5-6, C6-7: disc bulging and facet hypertrophy with mild spinal stenosis and severe biforaminal foraminal stenosis; no cord signal changes.  2. At C7-T1: uncovertebral joint hypertrophy and facet hypertrophy with moderate-severe right and mild left foraminal stenosis.  3. At C3-4: uncovertebral joint hypertrophy and facet hypertrophy with moderate right and mild left foraminal stenosis.  4. At C2-3: uncovertebral joint hypertrophy and facet hypertrophy with moderate right foraminal stenosis.  5. Degenerative disc and endplate disease P3-7 to C6-7 with marrow edema/inflammation and type 1 Modic changes.  6. No intrinsic or abnormal enhancing spinal cord lesions.  7. Compared to MRI on 07/11/03 there has been progression of degenerative spine disease, and the C2-3 spinal cord lesion is no longer seen.    ASSESSMENT AND PLAN  63 y.o. female here with multiple sclerosis since 2001, on copaxone since 2007. Previously on avonex and betaseron.  Complains of long-standing fatigue and hypersomnolence.  PLAN: 1. Continue Copaxone 40mg  TIW   Return in about 1 year (around 01/05/2015).  Penni Bombard, MD 9/0/2409, 7:35  PM Certified in Neurology, Neurophysiology and Neuroimaging  [facility] [address] [place], [place] [address] [phone]

## 2014-01-07 ENCOUNTER — Other Ambulatory Visit: Payer: Self-pay

## 2014-01-07 DIAGNOSIS — Z1231 Encounter for screening mammogram for malignant neoplasm of breast: Secondary | ICD-10-CM

## 2014-01-20 ENCOUNTER — Telehealth: Payer: Self-pay | Admitting: *Deleted

## 2014-01-20 DIAGNOSIS — Z0289 Encounter for other administrative examinations: Secondary | ICD-10-CM

## 2014-01-20 NOTE — Telephone Encounter (Signed)
Form, fmla matrix absence management to nurse 01-20-14 to be completed.

## 2014-01-25 ENCOUNTER — Telehealth: Payer: Self-pay

## 2014-01-25 NOTE — Telephone Encounter (Signed)
Patient brought in 14 pages of records from [facility] and she carried them up to Dr. Edwyna Ready Smith's front office staff 01/25/2014

## 2014-01-25 NOTE — Telephone Encounter (Signed)
To Dr. Leta Baptist 01-26-14.

## 2014-02-02 ENCOUNTER — Other Ambulatory Visit: Payer: Self-pay | Admitting: Nurse Practitioner

## 2014-02-03 ENCOUNTER — Telehealth: Payer: Self-pay | Admitting: *Deleted

## 2014-02-03 NOTE — Telephone Encounter (Signed)
Form, Matrix Absence Management  Faxed 02-03-14, mailed copy to patient.

## 2014-02-09 NOTE — Patient Instructions (Signed)
Placed forms in Dr. Gladstone Lighter inbox. //cl

## 2014-02-11 ENCOUNTER — Ambulatory Visit (INDEPENDENT_AMBULATORY_CARE_PROVIDER_SITE_OTHER): Payer: 59 | Admitting: Family Medicine

## 2014-02-11 ENCOUNTER — Ambulatory Visit
Admission: RE | Admit: 2014-02-11 | Discharge: 2014-02-11 | Disposition: A | Discharge status: Home / Self Care | Payer: 59 | Source: Ambulatory Visit

## 2014-02-11 ENCOUNTER — Encounter: Payer: Self-pay | Admitting: Family Medicine

## 2014-02-11 VITALS — BP 109/71 | HR 75 | Temp 98.1°F | Resp 16 | Ht 64.75 in | Wt 173.4 lb

## 2014-02-11 DIAGNOSIS — E782 Mixed hyperlipidemia: Secondary | ICD-10-CM

## 2014-02-11 DIAGNOSIS — Z1231 Encounter for screening mammogram for malignant neoplasm of breast: Secondary | ICD-10-CM

## 2014-02-11 DIAGNOSIS — N949 Unspecified condition associated with female genital organs and menstrual cycle: Secondary | ICD-10-CM

## 2014-02-11 DIAGNOSIS — K589 Irritable bowel syndrome without diarrhea: Secondary | ICD-10-CM

## 2014-02-11 DIAGNOSIS — H8110 Benign paroxysmal vertigo, unspecified ear: Secondary | ICD-10-CM

## 2014-02-11 DIAGNOSIS — R42 Dizziness and giddiness: Secondary | ICD-10-CM

## 2014-02-11 DIAGNOSIS — Z1211 Encounter for screening for malignant neoplasm of colon: Secondary | ICD-10-CM

## 2014-02-11 DIAGNOSIS — Z0000 Encounter for general adult medical examination without abnormal findings: Secondary | ICD-10-CM

## 2014-02-11 DIAGNOSIS — R102 Pelvic and perineal pain: Secondary | ICD-10-CM

## 2014-02-11 LAB — COMPLETE METABOLIC PANEL WITH GFR
ALT: 14 U/L (ref 0–35)
AST: 15 U/L (ref 0–37)
Albumin: 4.2 g/dL (ref 3.5–5.2)
Alkaline Phosphatase: 71 U/L (ref 39–117)
BUN: 14 mg/dL (ref 6–23)
CHLORIDE: 105 meq/L (ref 96–112)
CO2: 29 mEq/L (ref 19–32)
CREATININE: 0.78 mg/dL (ref 0.50–1.10)
Calcium: 9.2 mg/dL (ref 8.4–10.5)
GFR, Est African American: >89 mL/min
GFR, Est Non African American: 82 mL/min
Glucose, Bld: 84 mg/dL (ref 70–99)
POTASSIUM: 4.7 meq/L (ref 3.5–5.3)
Sodium: 141 mEq/L (ref 135–145)
Total Bilirubin: 0.5 mg/dL (ref 0.2–1.2)
Total Protein: 6.6 g/dL (ref 6.0–8.3)

## 2014-02-11 LAB — LIPID PANEL
Cholesterol: 220 mg/dL — ABNORMAL HIGH (ref 0–200)
HDL: 52 mg/dL (ref >39)
LDL CALC: 139 mg/dL — AB (ref 0–99)
Total CHOL/HDL Ratio: 4.2 Ratio
Triglycerides: 143 mg/dL (ref <150)
VLDL: 29 mg/dL (ref 0–40)

## 2014-02-11 MED ORDER — CITALOPRAM HYDROBROMIDE 20 MG PO TABS: 20.0000 mg | ORAL_TABLET | Freq: Every day | ORAL | Status: DC

## 2014-02-11 MED ORDER — ONDANSETRON 4 MG PO TBDP: ORAL_TABLET | ORAL | Status: DC

## 2014-02-11 NOTE — Progress Notes (Addendum)
Subjective:    Patient ID: Katie Maldonado, female    DOB: 30-Dec-1950, 63 y.o.   MRN: [id number]  HPI  This 63 y.o. Cauc female is here for Annual CPE and labs. She has MS which is stable and quiescent on Copaxone ("best medicine ever"). She has vertigo which has been evaluated over 1 year ago and resolved; now w/ recurrent symptoms. Also has hx of IBS- diarrhea predominant; now with worsening nausea and mild diffuse abd pain. Having to take Zofran "more and more".  HCM: PAP- NA (s/p TAH- fibroids).           MMG- Current > today (negative).           CRS- 2008 (negative per pt; Dr. Verdia Kuba).           DEXA- 12/10/2012 (osteoporosis).           IMM- Current except for Tetanus; PP-23 in 2013. Consider [phone].           Vision- Every 1-2 years; wears corrective lenses.                       Patient Active Problem List   Diagnosis Date Noted  . Low back pain 11/26/2013  . Numbness and tingling in left arm 07/09/2013  . Shoulder bursitis 06/18/2013  . Anxiety state, unspecified 12/30/2012  . Other malaise and fatigue 12/30/2012  . Osteoporosis, unspecified 11/13/2012  . Osteoarthritis of multiple joints 04/18/2011  . Incontinence of urine 04/18/2011  . HYPERLIPIDEMIA-MIXED 01/08/2010  . MULTIPLE SCLEROSIS 01/08/2010  . DYSPNEA 01/05/2010    Prior to Admission medications   Medication Sig Start Date End Date Taking? Authorizing Provider  aspirin EC 81 MG tablet Take 1 tablet (81 mg total) by mouth daily. 04/18/11  Yes Zigmund Gottron, MD  Biotin 1 MG CAPS Take by mouth.   Yes Historical Provider, MD  cholecalciferol (VITAMIN D) 1000 UNITS tablet Take 1,000 Units by mouth daily.   Yes Historical Provider, MD  citalopram (CELEXA) 20 MG tablet Take 1 tablet (20 mg total) by mouth daily. 05/27/13  Yes Barton Fanny, MD  COPAXONE 40 MG/ML SOSY INJECT 40 MG INTO THE SKIN 3 TIMES A WEEK. 02/02/14  Yes Penni Bombard, MD  gabapentin (NEURONTIN) 100 MG capsule Take 2  capsules (200 mg total) by mouth at bedtime. 07/09/13  Yes Lyndal Pulley, DO  ibuprofen (ADVIL,MOTRIN) 200 MG tablet Take 2 tablets (400 mg total) by mouth 4 (four) times daily. 04/18/11  Yes Zigmund Gottron, MD  meclizine (ANTIVERT) 25 MG tablet TAKE 1 TABLET BY MOUTH 3 TIMES DAILY AS NEEDED. 04/01/12  Yes Ryan M Dunn, PA-C  meloxicam (MOBIC) 15 MG tablet Take 1 tablet (15 mg total) by mouth daily. 11/26/13  Yes Lyndal Pulley, DO  Omega-3 Fatty Acids (FISH OIL) 1000 MG CAPS Take 1,000 mg by mouth 2 (two) times daily.  04/18/11  Yes Zigmund Gottron, MD  ondansetron (ZOFRAN-ODT) 4 MG disintegrating tablet TAKE 1 TABLET BY MOUTH EVERY 6 HOURS AS NEEDED 08/24/13  Yes Barton Fanny, MD  Red Yeast Rice Extract (RED YEAST RICE PO) Take by mouth 2 (two) times daily.    Yes Historical Provider, MD  traMADol (ULTRAM) 50 MG tablet Take 1 tablet (50 mg total) by mouth at bedtime as needed. 11/26/13  Yes Lyndal Pulley, DO  TURMERIC PO Take 1 tablet by mouth daily.   Yes Historical Provider, MD  History   Social History  . Marital Status: Married    Spouse Name: Dyke Brackett"    Number of Children: 2  . Years of Education: college   Occupational History  . PATIENT ACCOUNTING [employer]  . [employer]   Social History Main Topics  . Smoking status: Former Smoker -- 37 years    Types: Cigarettes    Quit date: 02/26/2004  . Smokeless tobacco: Never Used  . Alcohol Use: 0.0 oz/week     Comment: occ  . Drug Use: No  . Sexual Activity: Yes    Birth Control/ Protection: None   Other Topics Concern  . Not on file   Social History Narrative   Patient is married [name]), has 2 children   Patient is right handed   Education level is 2 yrs of college   Caffeine consumption is 3-4 cups daily    Family History  Problem Relation Age of Onset  . Emphysema Father   . Cancer Brother     bone  . Heart disease Brother   . Cancer Maternal Grandmother   .  Heart attack Brother   . Cancer Maternal Grandfather   . Cancer Paternal Grandmother   . Cancer Paternal Grandfather     Review of Systems  Constitutional: Positive for appetite change and fatigue.  HENT: Negative.   Eyes: Negative.   Respiratory: Positive for shortness of breath. Negative for cough, chest tightness and wheezing.   Cardiovascular: Negative.   Gastrointestinal: Positive for nausea, abdominal pain, diarrhea and constipation.  Endocrine: Positive for heat intolerance and polyphagia. Negative for polydipsia and polyuria.  Genitourinary: Positive for urgency and frequency.  Musculoskeletal: Positive for arthralgias, joint swelling and neck stiffness.  Skin: Negative.   Allergic/Immunologic: Negative.   Neurological: Positive for numbness.  Psychiatric/Behavioral: Positive for confusion, sleep disturbance and decreased concentration. The patient is nervous/anxious.        Objective:   Physical Exam  Nursing note and vitals reviewed. Constitutional: She is oriented to person, place, and time. Vital signs are normal. She appears well-developed and well-nourished. No distress.  HENT:  Head: Normocephalic and atraumatic.  Right Ear: Hearing, tympanic membrane, external ear and ear canal normal.  Left Ear: Hearing, tympanic membrane, external ear and ear canal normal.  Nose: Nose normal. No nasal deformity or septal deviation. Right sinus exhibits no maxillary sinus tenderness and no frontal sinus tenderness. Left sinus exhibits no maxillary sinus tenderness and no frontal sinus tenderness.  Mouth/Throat: Uvula is midline, oropharynx is clear and moist and mucous membranes are normal. No oral lesions. Normal dentition. No dental caries.  Eyes: Conjunctivae, EOM and lids are normal. Pupils are equal, round, and reactive to light. No scleral icterus.  Neck: Trachea normal, normal range of motion, full passive range of motion without pain and phonation normal. Neck supple. No JVD  present. No spinous process tenderness and no muscular tenderness present. Carotid bruit is not present. No mass and no thyromegaly present.  Cardiovascular: Normal rate, regular rhythm, S1 normal, S2 normal, normal heart sounds, intact distal pulses and normal pulses.   No extrasystoles are present. PMI is not displaced.  Exam reveals no gallop and no friction rub.   No murmur heard. Pulmonary/Chest: Effort normal and breath sounds normal. No respiratory distress. Right breast exhibits no inverted nipple, no mass, no nipple discharge, no skin change and no tenderness. Left breast exhibits no inverted nipple, no mass, no nipple discharge, no skin change and no tenderness.  Breasts are symmetrical.  Abdominal: Soft. Normal appearance and bowel sounds are normal. She exhibits no distension, no abdominal bruit, no pulsatile midline mass and no mass. There is no hepatosplenomegaly. There is tenderness in the right lower quadrant, epigastric area and left lower quadrant. There is no rigidity, no rebound, no guarding and no CVA tenderness.  Genitourinary: There is no rash, tenderness or lesion on the right labia. There is no rash, tenderness or lesion on the left labia. Right adnexum displays no mass, no tenderness and no fullness. Left adnexum displays tenderness. Left adnexum displays no mass and no fullness. No erythema, tenderness or bleeding around the vagina. No signs of injury around the vagina. No vaginal discharge found.  Vaginal cuff intact; no lesions.  Musculoskeletal:       Cervical back: She exhibits decreased range of motion, tenderness and spasm. She exhibits no bony tenderness, no deformity and no pain.       Thoracic back: Normal.       Lumbar back: Normal.  Remainder of exam unremarkable.  Lymphadenopathy:       Head (right side): No submental, no submandibular, no tonsillar, no preauricular, no posterior auricular and no occipital adenopathy present.       Head (left side): No submental,  no submandibular, no tonsillar, no preauricular, no posterior auricular and no occipital adenopathy present.    She has no cervical adenopathy.    She has no axillary adenopathy.       Right: No inguinal and no supraclavicular adenopathy present.       Left: No inguinal and no supraclavicular adenopathy present.  Neurological: She is alert and oriented to person, place, and time. She has normal strength. She displays no atrophy and no tremor. No cranial nerve deficit or sensory deficit. She exhibits normal muscle tone. She displays a negative Romberg sign. Coordination and gait normal.  Strength in UE + 4+/5, LE= 4/5. DTRs- trace in upper ext and 1+ lower ext.   Skin: Skin is warm, dry and intact. No ecchymosis, no lesion and no rash noted. She is not diaphoretic. No cyanosis or erythema. No pallor. Nails show no clubbing.  Multiple round scaly hyperpigmented lesions on truck c/w [place].  Psychiatric: She has a normal mood and affect. Her speech is normal and behavior is normal. Judgment and thought content normal. Cognition and memory are normal.       Assessment & Plan:  Encounter for routine history and physical exam in female - Plan: COMPLETE METABOLIC PANEL WITH GFR  Irritable bowel syndrome (IBS) - Nutrition recommendations provided in AVS. Plan: H. pylori antibody, IgG, COMPLETE METABOLIC PANEL WITH GFR  Mixed hyperlipidemia - Plan: Lipid panel  Dizziness - Plan: US Carotid Duplex Bilateral  Benign paroxysmal positional vertigo, unspecified laterality - Plan: US Carotid Duplex Bilateral  Tenderness of female pelvic organs - Pt has hx of IBS but has vague GI/pelvic symptoms; need to rule out pelvic or ovarian abnormality. Plan: US Pelvis Complete  Screening for colon cancer - Plan: Hemoccult - 1 Card (office), Hemoccult - 1 Card (office), Hemoccult - 1 Card (office)   Meds ordered this encounter  Medications  . ondansetron (ZOFRAN-ODT) 4 MG disintegrating tablet    Sig: TAKE  1 TABLET BY MOUTH EVERY 6 HOURS AS NEEDED    Dispense:  60 tablet    Refill:  0  . citalopram (CELEXA) 20 MG tablet    Sig: Take 1 tablet (20 mg total) by mouth daily.  Dispense:  30 tablet    Refill:  11

## 2014-02-11 NOTE — Patient Instructions (Signed)
Keeping You Healthy  Get These Tests  Blood Pressure- Have your blood pressure checked by your healthcare provider at least once a year.  Normal blood pressure is 120/80.  Weight- Have your body mass index (BMI) calculated to screen for obesity.  BMI is a measure of body fat based on height and weight.  You can calculate your own BMI at www.nhlbisupport.com/bmi/  Cholesterol- Have your cholesterol checked every year.  Diabetes- Have your blood sugar checked every year if you have high blood pressure, high cholesterol, a family history of diabetes or if you are overweight.  Pap Smear- Have a pap smear every 1 to 3 years if you have been sexually active.  If you are older than 65 and recent pap smears have been normal you may not need additional pap smears.  In addition, if you have had a hysterectomy  For benign disease additional pap smears are not necessary.  Mammogram-Yearly mammograms are essential for early detection of breast cancer  Screening for Colon Cancer- Colonoscopy starting at age 50. Screening may begin sooner depending on your family history and other health conditions.  Follow up colonoscopy as directed by your Gastroenterologist.  Screening for Osteoporosis- Screening begins at age 65 with bone density scanning, sooner if you are at higher risk for developing Osteoporosis.  Get these medicines  Calcium with Vitamin D- Your body requires 1200-1500 mg of Calcium a day and 800-1000 IU of Vitamin D a day.  You can only absorb 500 mg of Calcium at a time therefore Calcium must be taken in 2 or 3 separate doses throughout the day.  Hormones- Hormone therapy has been associated with increased risk for certain cancers and heart disease.  Talk to your healthcare provider about if you need relief from menopausal symptoms.  Aspirin- Ask your healthcare provider about taking Aspirin to prevent Heart Disease and Stroke.  Get these Immuniztions  Flu shot- Every fall  Pneumonia  shot- Once after the age of 65; if you are younger ask your healthcare provider if you need a pneumonia shot.  Tetanus- Every ten years.  Zostavax- Once after the age of 60 to prevent shingles.  Take these steps  Don't smoke- Your healthcare provider can help you quit. For tips on how to quit, ask your healthcare provider or go to www.smokefree.gov or call 1-800 QUIT-NOW.  Be physically active- Exercise 5 days a week for a minimum of 30 minutes.  If you are not already physically active, start slow and gradually work up to 30 minutes of moderate physical activity.  Try walking, dancing, bike riding, swimming, etc.  Eat a healthy diet- Eat a variety of healthy foods such as fruits, vegetables, whole grains, low fat milk, low fat cheeses, yogurt, lean meats, chicken, fish, eggs, dried beans, tofu, etc.  For more information go to www.thenutritionsource.org  Dental visit- Brush and floss teeth twice daily; visit your dentist twice a year.  Eye exam- Visit your Optometrist or Ophthalmologist yearly.  Drink alcohol in moderation- Limit alcohol intake to one drink or less a day.  Never drink and drive.  Depression- Your emotional health is as important as your physical health.  If you're feeling down or losing interest in things you normally enjoy, please talk to your healthcare provider.  Seat Belts- can save your life; always wear one  Smoke/Carbon Monoxide detectors- These detectors need to be installed on the appropriate level of your home.  Replace batteries at least once a year.  Violence- If anyone   is threatening or hurting you, please tell your healthcare provider.  Living Will/ Health care power of attorney- Discuss with your healthcare provider and family.    Diet and Irritable Bowel Syndrome  No cure has been found for irritable bowel syndrome (IBS). Many options are available to treat the symptoms. Your caregiver will give you the best treatments available for your symptoms. He  or she will also encourage you to manage stress and to make changes to your diet. You need to work with your caregiver and Registered Dietician to find the best combination of medicine, diet, counseling, and support to control your symptoms. The following are some diet suggestions. FOODS THAT MAKE IBS WORSE  Fatty foods, such as [place] fries.  Milk products, such as cheese or ice cream.  Chocolate.  Alcohol.  Caffeine (found in coffee and some sodas).  Carbonated drinks, such as soda. If certain foods cause symptoms, you should eat less of them or stop eating them. FOOD JOURNAL   Keep a journal of the foods that seem to cause distress. Write down:  What you are eating during the day and when.  What problems you are having after eating.  When the symptoms occur in relation to your meals.  What foods always make you feel badly.  Take your notes with you to your caregiver to see if you should stop eating certain foods. FOODS THAT MAKE IBS BETTER Fiber reduces IBS symptoms, especially constipation, because it makes stools soft, bulky, and easier to pass. Fiber is found in bran, bread, cereal, beans, fruit, and vegetables. Examples of foods with fiber include:  Apples.  Peaches.  Pears.  Berries.  Figs.  Broccoli, raw.  Cabbage.  Carrots.  Raw peas.  Kidney beans.  Lima beans.  Whole-grain bread.  Whole-grain cereal. Add foods with fiber to your diet a little at a time. This will let your body get used to them. Too much fiber at once might cause gas and swelling of your abdomen. This can trigger symptoms in a person with IBS. Caregivers usually recommend a diet with enough fiber to produce soft, painless bowel movements. High fiber diets may cause gas and bloating. However, these symptoms often go away within a few weeks, as your body adjusts. In many cases, dietary fiber may lessen IBS symptoms, particularly constipation. However, it may not help pain or diarrhea.  High fiber diets keep the colon mildly enlarged (distended) with the added fiber. This may help prevent spasms in the colon. Some forms of fiber also keep water in the stool, thereby preventing hard stools that are difficult to pass.  Besides telling you to eat more foods with fiber, your caregiver may also tell you to get more fiber by taking a fiber pill or drinking water mixed with a special high fiber powder. An example of this is a natural fiber laxative containing psyllium seed.  TIPS  Large meals can cause cramping and diarrhea in people with IBS. If this happens to you, try eating 4 or 5 small meals a day, or try eating less at each of your usual 3 meals. It may also help if your meals are low in fat and high in carbohydrates. Examples of carbohydrates are pasta, rice, whole-grain breads and cereals, fruits, and vegetables.  If dairy products cause your symptoms to flare up, you can try eating less of those foods. You might be able to handle yogurt better than other dairy products, because it contains bacteria that helps with digestion. Dairy  products are an important source of calcium and other nutrients. If you need to avoid dairy products, be sure to talk with a Registered Dietitian about getting these nutrients through other food sources.  Drink enough water and fluids to keep your urine clear or pale yellow. This is important, especially if you have diarrhea. FOR MORE INFORMATION  International Foundation for Functional Gastrointestinal Disorders: www.iffgd.org  National Digestive Diseases Information Clearinghouse: digestive.AmenCredit.is Document Released: 07/06/2003 Document Revised: 07/08/2011 Document Reviewed: 07/16/2013 [facility] Patient Information 2015 [place], [place]. This information is not intended to replace advice given to you by your health care provider. Make sure you discuss any questions you have with your health care provider.

## 2014-02-13 ENCOUNTER — Encounter: Payer: Self-pay | Admitting: Family Medicine

## 2014-02-14 ENCOUNTER — Other Ambulatory Visit: Payer: Self-pay | Admitting: Family Medicine

## 2014-02-14 ENCOUNTER — Telehealth: Payer: Self-pay | Admitting: *Deleted

## 2014-02-14 DIAGNOSIS — R102 Pelvic and perineal pain: Secondary | ICD-10-CM

## 2014-02-14 LAB — H. PYLORI ANTIBODY, IGG: H PYLORI IGG: 0.7 {ISR}

## 2014-02-14 NOTE — Telephone Encounter (Signed)
Form,[employer] Dr Leta Baptist made corrections to form faxed 02-14-14.

## 2014-02-24 ENCOUNTER — Ambulatory Visit
Admission: RE | Admit: 2014-02-24 | Discharge: 2014-02-24 | Disposition: A | Discharge status: Home / Self Care | Payer: 59 | Source: Ambulatory Visit | Attending: Family Medicine | Admitting: Family Medicine

## 2014-02-24 ENCOUNTER — Telehealth: Payer: Self-pay | Admitting: Diagnostic Neuroimaging

## 2014-02-24 DIAGNOSIS — R42 Dizziness and giddiness: Secondary | ICD-10-CM

## 2014-02-24 DIAGNOSIS — H8110 Benign paroxysmal vertigo, unspecified ear: Secondary | ICD-10-CM

## 2014-02-24 NOTE — Telephone Encounter (Signed)
Patient stated she experienced side effect of burning sensation from top of head to bottom of feet, extreme chills, tightness in chest, and back of head feels like a rock  Questioning if she should continue taking COPAXONE 40 MG/ML SOSY.  Please call anytime and may leave detailed message on voicemail.

## 2014-02-25 NOTE — Telephone Encounter (Signed)
I called patient x 3. No answer. Left message. She may come in next week to discuss further. -VRP

## 2014-03-01 ENCOUNTER — Ambulatory Visit (HOSPITAL_COMMUNITY)
Admission: RE | Admit: 2014-03-01 | Discharge: 2014-03-01 | Disposition: A | Discharge status: Home / Self Care | Payer: 59 | Source: Ambulatory Visit | Attending: Family Medicine | Admitting: Family Medicine

## 2014-03-01 DIAGNOSIS — N949 Unspecified condition associated with female genital organs and menstrual cycle: Secondary | ICD-10-CM | POA: Insufficient documentation

## 2014-03-01 DIAGNOSIS — Z90710 Acquired absence of both cervix and uterus: Secondary | ICD-10-CM | POA: Diagnosis not present

## 2014-03-01 DIAGNOSIS — R102 Pelvic and perineal pain: Secondary | ICD-10-CM

## 2014-03-03 ENCOUNTER — Telehealth: Payer: Self-pay | Admitting: Family Medicine

## 2014-03-03 NOTE — Telephone Encounter (Signed)
I phoned pt to advise of results of recent pelvic ultrasound (normal post- hysterectomy). Results also released to pt via MyChart.

## 2014-03-04 LAB — HEMOCCULT GUIAC POC 1CARD (OFFICE)
Card #2 Fecal Occult Blod, POC: NEGATIVE
Card #2 Fecal Occult Blod, POC: NEGATIVE
Card #3 Fecal Occult Blood, POC: NEGATIVE
Card #3 Fecal Occult Blood, POC: NEGATIVE
Card #3 Fecal Occult Blood, POC: NEGATIVE
FECAL OCCULT BLD: NEGATIVE
FECAL OCCULT BLD: NEGATIVE
FECAL OCCULT BLD: NEGATIVE
Fecal Occult Blood, POC: NEGATIVE

## 2014-03-04 NOTE — Addendum Note (Signed)
Addended by: Yvette Rack on: 03/04/2014 05:27 PM   Modules accepted: Orders

## 2014-03-11 ENCOUNTER — Ambulatory Visit: Payer: 59 | Admitting: Family Medicine

## 2014-04-14 ENCOUNTER — Encounter: Payer: Self-pay | Admitting: Family Medicine

## 2014-04-14 ENCOUNTER — Ambulatory Visit (INDEPENDENT_AMBULATORY_CARE_PROVIDER_SITE_OTHER): Payer: 59 | Admitting: Family Medicine

## 2014-04-14 VITALS — BP 128/84 | HR 68 | Ht 65.0 in

## 2014-04-14 DIAGNOSIS — S93401A Sprain of unspecified ligament of right ankle, initial encounter: Secondary | ICD-10-CM

## 2014-04-14 DIAGNOSIS — S93409A Sprain of unspecified ligament of unspecified ankle, initial encounter: Secondary | ICD-10-CM | POA: Insufficient documentation

## 2014-04-14 NOTE — Patient Instructions (Signed)
Good to see you Ice bath 20 minutes at end of the day.  Exercises 3 times a week.  [email or web address] size medium.  Pennsaid twice daily.  See me again in 3 weeks if not better.

## 2014-04-14 NOTE — Progress Notes (Signed)
  [facility] [address] [place], [place] [address] Phone: [phone] Subjective:      CC: Right ankle swelling   [id number] Katie Maldonado is a 63 y.o. female coming in with complaint of right ankle swelling. Patient states this week and she was walking a significant amount more. Patient states that it had swelling and after she did all this walking. Patient has had this intermittently previously. Seems to go away on its own. States that the swelling is already improving. Does not remember any true injury. Patient does have a past medical history significant for multiple sclerosis but denies any numbness or weakness. Patient has not tried any home modalities at this time. Patient continues with some of the natural supplementations we discussed previously. This is not stopping her from any activities but would like to know the reason why this is occurring. States the discomfort is 3 out of 10 in severity. No nighttime awakening.     Past medical history, social, surgical and family history all reviewed in electronic medical record.   Review of Systems: No headache, visual changes, nausea, vomiting, diarrhea, constipation, dizziness, abdominal pain, skin rash, fevers, chills, night sweats, weight loss, swollen lymph nodes, body aches, joint swelling, muscle aches, chest pain, shortness of breath, mood changes.   Objective Blood pressure 128/84, pulse 68, height 5\' 5"  (1.651 m), SpO2 97 %.  General: No apparent distress alert and oriented x3 mood and affect normal, dressed appropriately.  HEENT: Pupils equal, extraocular movements intact  Respiratory: Patient's speak in full sentences and does not appear short of breath  Cardiovascular: No lower extremity edema, non tender, no erythema  Skin: Warm dry intact with no signs of infection or rash on extremities or on axial skeleton.  Abdomen: Soft nontender  Neuro: Cranial nerves II through XII are  intact, neurovascularly intact in all extremities with 2+ DTRs and 2+ pulses.  Lymph: No lymphadenopathy of posterior or anterior cervical chain or axillae bilaterally.  Gait normal with good balance and coordination.  MSK:  Non tender with full range of motion and good stability and symmetric strength and tone of shoulders, elbows, wrist, hip, knee and bilaterally.  Ankle: Right Trace lateral swelling mostly over the lateral malleolus and the ATFL Range of motion is full in all directions. Strength is 5/5 in all directions. Stable lateral and medial ligaments; squeeze test and kleiger test unremarkable; mild laxity of the ATFL seems to be chronic. Talar dome nontender; No pain at base of 5th MT; No tenderness over cuboid; No tenderness over N spot or navicular prominence No tenderness on posterior aspects of lateral and medial malleolus No sign of peroneal tendon subluxations or tenderness to palpation Negative tarsal tunnel tinel's Able to walk 4 steps. Contralateral ankle unremarkable.   Impression and Recommendations:     This case required medical decision making of moderate complexity.

## 2014-04-14 NOTE — Assessment & Plan Note (Signed)
Discussed with patient at great length. We discussed the possibility of bracing and patient will try an over-the-counter brace. We discussed icing protocol, home exercises and we discussed strengthening. I do not think this is associated with her multiple sclerosis but we'll continue to monitor. I do not feel that any medications other than the meloxicam could be beneficial. Patient is going to try these different interventions and come back and see me again in 3-4 weeks for further evaluation.  Spent greater than 25 minutes with patient face-to-face and had greater than 50% of counseling including as described above in assessment and plan.

## 2014-05-12 ENCOUNTER — Encounter: Payer: Self-pay | Admitting: Diagnostic Neuroimaging

## 2014-05-13 ENCOUNTER — Telehealth: Payer: Self-pay | Admitting: Diagnostic Neuroimaging

## 2014-05-13 NOTE — Telephone Encounter (Signed)
Pt is calling stating she emailed the doctor stating that she thinks she is having an allergic reaction to COPAXONE 40 MG/ML SOSY.  She is due for another injection and is not sure if she should take it.  Please call and advise, you may leave a detailed message if she does not answer.

## 2014-05-14 NOTE — Telephone Encounter (Signed)
I called patient. Advised to stop copaxone. May be side effect vs MS flare or some other issue. Will setup revisit for next week. May need to check MRI brain as well. -VRP

## 2014-05-18 ENCOUNTER — Ambulatory Visit (INDEPENDENT_AMBULATORY_CARE_PROVIDER_SITE_OTHER): Payer: 59 | Admitting: Diagnostic Neuroimaging

## 2014-05-18 ENCOUNTER — Encounter: Payer: Self-pay | Admitting: Diagnostic Neuroimaging

## 2014-05-18 VITALS — BP 134/90 | HR 64 | Temp 97.8°F | Ht 65.0 in | Wt 177.8 lb

## 2014-05-18 DIAGNOSIS — G35 Multiple sclerosis: Secondary | ICD-10-CM

## 2014-05-18 NOTE — Patient Instructions (Signed)
Stop copaxone.  Consider switching to gilenya or tecfidera.

## 2014-05-18 NOTE — Progress Notes (Signed)
PATIENT: Katie Maldonado DOB: 18-Nov-1950   REASON FOR VISIT: follow up for MS HISTORY FROM: patient  Chief Complaint  Patient presents with  . Follow-up    multiple sclerosis, malaise and fatigue     HISTORY OF PRESENT ILLNESS:  UPDATE 05/18/14: MS symptoms stable. Has had 2 reactions to copaxone, leading to tinging, prickly sensations over whole body. 1 event led to throat tightness, tongue feeling thick, and worried patient. Here to discuss possibilities of switching therapies.  UPDATE 01/04/14 (VRP): Since last visit, doing the same. No new neuro symptoms. Does report persistent fatigue, weight gain, incr appetite, feeling hot. Tolerating copaxone. Misses 2-3 injections per month.   UPDATE 06/30/13 (LL):  Since last visit patient continues to have problems with fatigue and malaise, numbness and paresthesias in left arm which is new for her, started a couple weeks ago.  She states it is constant, does not hurt, but annoying.  She is still able to work.  Follow up MRI brain at last visit showed no acute or new lesions, with moderate cerebral atrophy.  UPDATE 12/30/12 (VP): Since last visit patient continues to have problems with fatigue, insomnia, anxiety, shortness of breath. For past 6 months she's been having dyspnea on exertion especially climbing steps at her work. She has been evaluated by PCP without specific diagnosis. Patient continues on Copaxone (now 3 times per week dosing). Last MRI brain was in 2011. Regarding fatigue she has been on Provigil and Ritalin in the past without relief. Regarding anxiety she was on Paxil in the past but this caused nausea and stomach problems.   PRIOR HPI (05/02/12, Dr. 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 Her MRI of brain 06/10/1999 showed white matter abnormalities not typical for MS and MRI of the cervical spine without  contrast 07/27/99 showed a focal area of abnormality in the spinal cord at C2-3. She had positive CSF IgG index and oligoclonal banding. Initially the VER was normal with subsequent VERs 12/04/1999, 12/10/1999, and 02/01/2000 abnormal in the right eye. She began Avonex which she took for one year and switched to Rebif beginning 08/2000. She was then switched to Betaseron 04/2001 because her insurance would not pay for Rebif, but was subsequently switched back to Rebif 04/2003. Because of elevated liver function tests she was switched to Copaxone 07/03/2005.She has felt better on Copaxone then on the interferons. Her visual acuity varies from 20/30 to 20/40 in the left eye and to 20/200 in the right. She had burning paresthesias treated with gabapentin and at times amitriptyline. NMO blood test was negative 02/21/2005. She has been followed Dr. Estanislado Pandy, rheumatologist for the question of RA versus sarcoidosis and by Dr.Clinton Young for hypersomnolence without a diagnosis of narcolepsy or obstructive Sleep apnea. MRI studies of the brain and cervical spine 02/08/2010 showed periventricular lesions and unchanged lesion at C2-3. CBC and CMP 03/17/2009 were normal. CBC, CMP, TSH, lipid profile except LDL 151 were normal 08/07/09.vitamin D level 10/16/2009 was 41. She complains of fatigue and her right foot turning in an occasional foot cramping. She fell 10/2009 fracturing her right foot in a [employer] parking lot. In 2010 she fractured her left foot, left wrist, and her left arm. She has continued swelling in her right foot and lower leg. She has an overactive bladder followed by Dr.Dalhstedt. She has a history of Lhermitte's sign worse with neck extension but also  present with flexion. She had numbness on the right side of her body except her face and head for one month.Her bladder symptoms improved on enablex.She walks 1.8 miles 3 times per week. 03/05/2011 she was lying in bed asleep and awoke trying to sit up and developed  dizziness on 2 different occasions. She fell back in to the bed and went to sleep. On the third episode she became concerned. The episodes of dizziness would last seconds and are described as spinning. She had dizziness during the day without spinning She did not have head trauma or a cold. She was seen by Dr. Elder Cyphers at urgent care and underwent blood studies, EKG, and urinalysis. She has a history of migraine and a history of motion sickness. She has a Lhermitte's sign when she turns her head to the left with discomfort in her neck going into her left arm. This can occur 2 times per day and lasts Less than 1 minute. She has bladder symptoms and right flank pain and is concerned about another urinary tract infection. She can awaken with snoring when she has a cold. She sleeps well. She has dizziness 2 or 3 times per week when she lies down and turns her head to the left.   REVIEW OF SYSTEMS: Full 14 system review of systems performed and notable only for fatigue itching.   ALLERGIES: Allergies  Allergen Reactions  . Nitrofurantoin Shortness Of Breath    Shortness of breath, violent chills  . Sulfonamide Derivatives Shortness Of Breath    Shortness of breath, chills  . Cefdinir     Can't remember what rx had 2 yrs ago    HOME MEDICATIONS: Outpatient Prescriptions Prior to Visit  Medication Sig Dispense Refill  . aspirin EC 81 MG tablet Take 1 tablet (81 mg total) by mouth daily.    . Biotin 1 MG CAPS Take by mouth.    . cholecalciferol (VITAMIN D) 1000 UNITS tablet Take 1,000 Units by mouth daily.    . citalopram (CELEXA) 20 MG tablet Take 1 tablet (20 mg total) by mouth daily. 30 tablet 11  . gabapentin (NEURONTIN) 100 MG capsule Take 2 capsules (200 mg total) by mouth at bedtime. 60 capsule 3  . ibuprofen (ADVIL,MOTRIN) 200 MG tablet Take 2 tablets (400 mg total) by mouth 4 (four) times daily.    . meclizine (ANTIVERT) 25 MG tablet TAKE 1 TABLET BY MOUTH 3 TIMES DAILY AS NEEDED. 30 tablet 0    . meloxicam (MOBIC) 15 MG tablet Take 1 tablet (15 mg total) by mouth daily. 30 tablet 3  . Omega-3 Fatty Acids (FISH OIL) 1000 MG CAPS Take 1,000 mg by mouth 2 (two) times daily.   0  . ondansetron (ZOFRAN-ODT) 4 MG disintegrating tablet TAKE 1 TABLET BY MOUTH EVERY 6 HOURS AS NEEDED 60 tablet 0  . COPAXONE 40 MG/ML SOSY INJECT 40 MG INTO THE SKIN 3 TIMES A WEEK. (Patient not taking: Reported on 05/18/2014) 12 mL 12  . traMADol (ULTRAM) 50 MG tablet Take 1 tablet (50 mg total) by mouth at bedtime as needed. 30 tablet 0  . TURMERIC PO Take 1 tablet by mouth daily.    . Red Yeast Rice Extract (RED YEAST RICE PO) Take by mouth 2 (two) times daily.      No facility-administered medications prior to visit.   PHYSICAL EXAM  Filed Vitals:   05/18/14 1551  BP: 134/90  Pulse: 64  Temp: 97.8 F (36.6 C)  TempSrc: Oral  Height: 5\' 5"  (1.651 m)  Weight: 177 lb 12.8 oz (80.65 kg)   Body mass index is 29.59 kg/(m^2).  Physical Exam  General: Patient is awake, alert and in no acute distress. Well developed and groomed.  Neck: Neck is supple.  Cardiovascular: No carotid artery bruits. Heart is regular rate and rhythm with no murmurs.   Neurologic Exam  Mental Status: Awake, alert. Language is fluent and comprehension intact. Voice soft.  Cranial Nerves: Pupils are equal and reactive to light. Visual fields are full to confrontation. Conjugate eye movements are full and symmetric. Facial sensation and strength are symmetric. Hearing is intact. Palate elevated symmetrically and uvula is midline. Shoulder shrug is symmetric. Tongue is midline.    Motor: Normal bulk and tone. NO TREMOR. NO BRADYKINESIA. Full strength in the upper and lower extremities. No pronator drift.  Sensory: Intact and symmetric to light touch.  Coordination: No ataxia or dysmetria on finger-nose or rapid alternating movement testing.  Gait and Station:  Normal stance, rising from chair without assistance, smooth turns, able  to tandem and toe walk. Reflexes: Deep tendon reflexes in the UPPER EXT TRACE; ABSENT AT KNEES AND ANKLES.    DIAGNOSTIC DATA (LABS, IMAGING, TESTING) - I reviewed patient records, labs, notes, testing and imaging myself where available.  Lab Results  Component Value Date   TSH 1.554 05/27/2013   I reviewed images myself and agree with interpretation. -VRP  02/08/10 MRI brain - bilateral tiny periventricular, subcortical and cerebellar white matter hyperintensities which may be compatible with but are not necessarily diagnostic of demyelinating disease. No enhancing lesions are noted.   02/08/10 MRI cervical - prominent spondylitic changes from C4-C7 as stated above with mild canal and left more than right foraminal stenosis. Illdefined dorsal spinal cord hyperintensity at C2-3 likely represents remote age demyelinating plaque. No enhancing lesions are seen.   03/09/13 MRI Brain - Findings consistent with chronic multiple sclerosis. No new lesions since 2007. No white matter abnormalities displaying restricted diffusion or abnormal postcontrast enhancement. Moderate cerebral atrophy. Small vessel disease superimposed not excluded. Inspissated secretions left nasal cavity, compounding what was probably a large left middle turbinate concha bullosa.  07/08/13 MRI cervical spine (with and without) demonstrating:  1. At C4-5, C5-6, C6-7: disc bulging and facet hypertrophy with mild spinal stenosis and severe biforaminal foraminal stenosis; no cord signal changes.  2. At C7-T1: uncovertebral joint hypertrophy and facet hypertrophy with moderate-severe right and mild left foraminal stenosis.  3. At C3-4: uncovertebral joint hypertrophy and facet hypertrophy with moderate right and mild left foraminal stenosis.  4. At C2-3: uncovertebral joint hypertrophy and facet hypertrophy with moderate right foraminal stenosis.  5. Degenerative disc and endplate disease W2-3 to C6-7 with marrow edema/inflammation  and type 1 Modic changes.  6. No intrinsic or abnormal enhancing spinal cord lesions.  7. Compared to MRI on 07/11/03 there has been progression of degenerative spine disease, and the C2-3 spinal cord lesion is no longer seen.   ASSESSMENT AND PLAN  64 y.o. female here with multiple sclerosis since 2001, on copaxone since 2007. Previously on avonex and betaseron.  Now intolerant to copaxone with 2 events of tingling, throat tightness, thick tongue with 2 injections. Could be allergic reaction or other side effect. Will plan to switch to tecfidera or gilenya.  PLAN: 1. Stop copaxone 2. Consider gilenya or tecfidera (patient will think about it and let me know) 3. Check baseline MRI brain and labs, in anticipation of switching  Orders Placed  This Encounter  Procedures  . MR Brain W Wo Contrast  . CBC With differential/Platelet  . Comprehensive metabolic panel  . Varicella Zoster Antibody, IgG  . Stratify JCV Antibody Test ([facility])   Return in about 3 months (around 08/17/2014).    Penni Bombard, MD 5/36/4680, 3:21 PM Certified in Neurology, Neurophysiology and Neuroimaging  [facility] [address] [place], [place] [address] [phone]

## 2014-05-19 LAB — COMPREHENSIVE METABOLIC PANEL
ALT: 16 IU/L (ref 0–32)
AST: 18 IU/L (ref 0–40)
Albumin/Globulin Ratio: 1.7 (ref 1.1–2.5)
Albumin: 4.4 g/dL (ref 3.6–4.8)
Alkaline Phosphatase: 86 IU/L (ref 39–117)
BUN/Creatinine Ratio: 18 (ref 11–26)
BUN: 16 mg/dL (ref 8–27)
CO2: 27 mmol/L (ref 18–29)
Calcium: 9.7 mg/dL (ref 8.7–10.3)
Chloride: 102 mmol/L (ref 97–108)
Creatinine, Ser: 0.9 mg/dL (ref 0.57–1.00)
GFR calc Af Amer: 80 mL/min/{1.73_m2} (ref >59)
GFR, EST NON AFRICAN AMERICAN: 69 mL/min/{1.73_m2} (ref >59)
GLOBULIN, TOTAL: 2.6 g/dL (ref 1.5–4.5)
GLUCOSE: 91 mg/dL (ref 65–99)
POTASSIUM: 4.1 mmol/L (ref 3.5–5.2)
Sodium: 140 mmol/L (ref 134–144)
Total Bilirubin: 0.4 mg/dL (ref 0.0–1.2)
Total Protein: 7 g/dL (ref 6.0–8.5)

## 2014-05-19 LAB — CBC WITH DIFFERENTIAL
BASOS ABS: 0.1 10*3/uL (ref 0.0–0.2)
Basos: 1 %
EOS ABS: 0.1 10*3/uL (ref 0.0–0.4)
Eos: 2 %
HEMATOCRIT: 38.1 % (ref 34.0–46.6)
HEMOGLOBIN: 13.1 g/dL (ref 11.1–15.9)
Immature Grans (Abs): 0 10*3/uL (ref 0.0–0.1)
Immature Granulocytes: 0 %
LYMPHS ABS: 1.7 10*3/uL (ref 0.7–3.1)
LYMPHS: 28 %
MCH: 28.9 pg (ref 26.6–33.0)
MCHC: 34.4 g/dL (ref 31.5–35.7)
MCV: 84 fL (ref 79–97)
Monocytes Absolute: 0.5 10*3/uL (ref 0.1–0.9)
Monocytes: 8 %
NEUTROS ABS: 3.7 10*3/uL (ref 1.4–7.0)
NEUTROS PCT: 61 %
Platelets: 299 10*3/uL (ref 150–379)
RBC: 4.53 x10E6/uL (ref 3.77–5.28)
RDW: 13.7 % (ref 12.3–15.4)
WBC: 6.1 10*3/uL (ref 3.4–10.8)

## 2014-05-19 LAB — VARICELLA ZOSTER ANTIBODY, IGG: VARICELLA: 1503 {index} (ref >165)

## 2014-05-23 ENCOUNTER — Telehealth: Payer: Self-pay | Admitting: Diagnostic Neuroimaging

## 2014-05-23 NOTE — Telephone Encounter (Signed)
Patient questioning if it's ok to stay on Rx COPAXONE 40 MG/ML SOSY if she gets a epi pen.  Please call and advise.

## 2014-05-24 ENCOUNTER — Telehealth: Payer: Self-pay | Admitting: *Deleted

## 2014-05-24 ENCOUNTER — Telehealth: Payer: Self-pay | Admitting: Diagnostic Neuroimaging

## 2014-05-24 NOTE — Telephone Encounter (Signed)
Left message

## 2014-05-24 NOTE — Telephone Encounter (Signed)
Left vmail to return call.

## 2014-05-24 NOTE — Telephone Encounter (Signed)
Patient is returning your call today in regard to her meds.  Please call her back.

## 2014-05-25 ENCOUNTER — Other Ambulatory Visit: Payer: Self-pay | Admitting: *Deleted

## 2014-05-25 NOTE — Telephone Encounter (Signed)
Left message for pt on her cell phone about her starting tecfidera. Gave her information about the website and the telephone number if she had any questions or concerns. Also told her she could call back with any further questions

## 2014-05-25 NOTE — Telephone Encounter (Signed)
Patient returning call stating that she would like  to start the [place]. Please advise

## 2014-05-26 ENCOUNTER — Ambulatory Visit: Payer: 59

## 2014-05-26 ENCOUNTER — Ambulatory Visit (INDEPENDENT_AMBULATORY_CARE_PROVIDER_SITE_OTHER): Payer: 59

## 2014-05-26 DIAGNOSIS — G35 Multiple sclerosis: Secondary | ICD-10-CM

## 2014-05-27 MED ORDER — GADOPENTETATE DIMEGLUMINE 469.01 MG/ML IV SOLN: 17.0000 mL | Freq: Once | INTRAVENOUS | Status: AC | PRN

## 2014-05-30 ENCOUNTER — Other Ambulatory Visit: Payer: Self-pay | Admitting: Family Medicine

## 2014-05-31 NOTE — Telephone Encounter (Signed)
Zofran refill signed.

## 2014-05-31 NOTE — Telephone Encounter (Signed)
All info has already been forwarded to [place].  I called the patient back.  Got no answer.  Left message.

## 2014-05-31 NOTE — Telephone Encounter (Signed)
Dr Leward Quan, do you want to give pt RFs?

## 2014-05-31 NOTE — Telephone Encounter (Signed)
Pt is calling back wanting to know if her medication Tecfederia has been ordered yet.  Please call back her cell @ [phone].  If she does not answer you may leave a message.

## 2014-06-06 ENCOUNTER — Telehealth: Payer: Self-pay | Admitting: *Deleted

## 2014-06-06 NOTE — Telephone Encounter (Signed)
[facility] is  out of network for this patient Tecfidera. The pharmacy that is in network would be [facility] and their number is [phone].All the patient information was already faxed to them per Vicente Males at [facility].

## 2014-07-20 ENCOUNTER — Telehealth: Payer: Self-pay | Admitting: *Deleted

## 2014-07-20 NOTE — Telephone Encounter (Signed)
Left a message on the phone for the pt asking her to call back and reschedule her follow-up appt. When she does, please get her reschedule since Dr. Leta Baptist will not be here 08/22/14

## 2014-08-08 ENCOUNTER — Other Ambulatory Visit: Payer: Self-pay | Admitting: Family Medicine

## 2014-08-09 NOTE — Telephone Encounter (Signed)
Dr Leward Quan, you have Rxd this a couple of times for pt in past but w/out RFs. Do you want to give RF?

## 2014-08-09 NOTE — Telephone Encounter (Signed)
Zofran refilled 

## 2014-08-19 ENCOUNTER — Ambulatory Visit (INDEPENDENT_AMBULATORY_CARE_PROVIDER_SITE_OTHER): Payer: 59 | Admitting: Family Medicine

## 2014-08-19 ENCOUNTER — Encounter: Payer: Self-pay | Admitting: Family Medicine

## 2014-08-19 VITALS — BP 126/72 | HR 79 | Ht 65.0 in | Wt 177.0 lb

## 2014-08-19 DIAGNOSIS — M1990 Unspecified osteoarthritis, unspecified site: Secondary | ICD-10-CM

## 2014-08-19 DIAGNOSIS — M19042 Primary osteoarthritis, left hand: Secondary | ICD-10-CM

## 2014-08-19 DIAGNOSIS — M19049 Primary osteoarthritis, unspecified hand: Secondary | ICD-10-CM

## 2014-08-19 DIAGNOSIS — M19041 Primary osteoarthritis, right hand: Secondary | ICD-10-CM | POA: Insufficient documentation

## 2014-08-19 NOTE — Progress Notes (Signed)
  [facility] [place], [place] [address] Phone: [phone] Subjective:    CC: Right thumb pain  [id number] Katie Maldonado is a 64 y.o. female coming in with complaint of right thumb pain. Patient used to see another provider multiple years ago for arthritis in the thumb. Patient states that unfortunate she is starting to worsening symptoms. Patient needs to respond very well to injections. Patient denies any numbness or tingling. States that it is starting to affect her daily activities. Describes it as more of a sharp pain that occurs with repetitive activity. Patient rates the severity of pain a 6 out of 10 can give her somewhat of a throbbing sensation at night.      Past medical history, social, surgical and family history all reviewed in electronic medical record.   Review of Systems: No headache, visual changes, nausea, vomiting, diarrhea, constipation, dizziness, abdominal pain, skin rash, fevers, chills, night sweats, weight loss, swollen lymph nodes, body aches, joint swelling, muscle aches, chest pain, shortness of breath, mood changes.   Objective Blood pressure 126/72, pulse 79, height 5\' 5"  (1.651 m), weight 177 lb (80.287 kg), SpO2 98 %.  General: No apparent distress alert and oriented x3 mood and affect normal, dressed appropriately.  HEENT: Pupils equal, extraocular movements intact  Respiratory: Patient's speak in full sentences and does not appear short of breath  Cardiovascular: No lower extremity edema, non tender, no erythema  Skin: Warm dry intact with no signs of infection or rash on extremities or on axial skeleton.  Abdomen: Soft nontender  Neuro: Cranial nerves II through XII are intact, neurovascularly intact in all extremities with 2+ DTRs and 2+ pulses.  Lymph: No lymphadenopathy of posterior or anterior cervical chain or axillae bilaterally.  Gait normal with good balance and coordination.  MSK:  Non  tender with full range of motion and good stability and symmetric strength and tone of shoulders, elbows, wrist, hip, knee and ankles bilaterally.  Hand exam shows the patient does have a positive grind test on the right thumb. Mild crepitus with range of motion but still has full range of motion and full strength. Neurovascularly intact distally. Wrist is full range of motion and negative Finkelstein's test. Contralateral hand unremarkable.  Procedure: Real-time Ultrasound Guided Injection of right CMC joint Device: GE Logiq E  Ultrasound guided injection is preferred based studies that show increased duration, increased effect, greater accuracy, decreased procedural pain, increased response rate, and decreased cost with ultrasound guided versus blind injection.  Verbal informed consent obtained.  Time-out conducted.  Noted no overlying erythema, induration, or other signs of local infection.  Skin prepped in a sterile fashion.  Local anesthesia: Topical Ethyl chloride.  With sterile technique and under real time ultrasound guidance: With a 25-gauge 1 inch needle patient was injected with 0.5 mL of 0.5% Marcaine and 0.5 mL of Kenalog 40 mg/dL.  Completed without difficulty  Pain immediately resolved suggesting accurate placement of the medication.  Advised to call if fevers/chills, erythema, induration, drainage, or persistent bleeding.  Images permanently stored and available for review in the ultrasound unit.  Impression: Technically successful ultrasound guided injection.   Impression and Recommendations:     This case required medical decision making of moderate complexity.

## 2014-08-19 NOTE — Progress Notes (Signed)
Pre visit review using our clinic review tool, if applicable. No additional management support is needed unless otherwise documented below in the visit note. 

## 2014-08-19 NOTE — Patient Instructions (Signed)
Patient given verbal instructions 

## 2014-08-19 NOTE — Assessment & Plan Note (Signed)
Patient was given an injection today and tolerated the procedure very well. We discussed icing regimen and home exercises. We also discussed thumb spica splinting and patient was given 1 today. Patient is going to wear this at night. Patient will continue the over-the-counter natural supplementations. Patient in follow-up on an as-needed basis.

## 2014-08-22 ENCOUNTER — Ambulatory Visit: Payer: 59 | Admitting: Family Medicine

## 2014-08-22 ENCOUNTER — Ambulatory Visit: Payer: 59 | Admitting: Diagnostic Neuroimaging

## 2014-08-23 ENCOUNTER — Encounter: Payer: Self-pay | Admitting: Diagnostic Neuroimaging

## 2014-08-23 ENCOUNTER — Ambulatory Visit (INDEPENDENT_AMBULATORY_CARE_PROVIDER_SITE_OTHER): Payer: 59 | Admitting: Diagnostic Neuroimaging

## 2014-08-23 VITALS — BP 121/74 | HR 72 | Ht 65.0 in | Wt 163.4 lb

## 2014-08-23 DIAGNOSIS — G35 Multiple sclerosis: Secondary | ICD-10-CM | POA: Diagnosis not present

## 2014-08-23 NOTE — Patient Instructions (Signed)
Continue tecfidera.  I will check MRI and labs.

## 2014-08-23 NOTE — Progress Notes (Signed)
PATIENT: Katie Maldonado DOB: May 21, 1950   REASON FOR VISIT: follow up for MS HISTORY FROM: patient  Chief Complaint  Patient presents with  . Follow-up    Multiple Sclerosis     HISTORY OF PRESENT ILLNESS:  UPDATE 08/23/14: Since last visit, has been on tecfidera since beginning March 2016. She has noted more general numbness, fatigue, constipation, drawstring sensation in calves/hamstrings in past 3 weeks.   UPDATE 05/18/14: MS symptoms stable. Has had 2 reactions to copaxone, leading to tinging, prickly sensations over whole body. 1 event led to throat tightness, tongue feeling thick, and worried patient. Here to discuss possibilities of switching therapies.  UPDATE 01/04/14 (VRP): Since last visit, doing the same. No new neuro symptoms. Does report persistent fatigue, weight gain, incr appetite, feeling hot. Tolerating copaxone. Misses 2-3 injections per month.   UPDATE 06/30/13 (LL):  Since last visit patient continues to have problems with fatigue and malaise, numbness and paresthesias in left arm which is new for her, started a couple weeks ago.  She states it is constant, does not hurt, but annoying.  She is still able to work.  Follow up MRI brain at last visit showed no acute or new lesions, with moderate cerebral atrophy.  UPDATE 12/30/12 (VP): Since last visit patient continues to have problems with fatigue, insomnia, anxiety, shortness of breath. For past 6 months she's been having dyspnea on exertion especially climbing steps at her work. She has been evaluated by PCP without specific diagnosis. Patient continues on Copaxone (now 3 times per week dosing). Last MRI brain was in 2011. Regarding fatigue she has been on Provigil and Ritalin in the past without relief. Regarding anxiety she was on Paxil in the past but this caused nausea and stomach problems.   PRIOR HPI (05/02/12, Dr. 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 Her MRI of brain 06/10/1999 showed white matter abnormalities not typical for MS and MRI of the cervical spine without contrast 07/27/99 showed a focal area of abnormality in the spinal cord at C2-3. She had positive CSF IgG index and oligoclonal banding. Initially the VER was normal with subsequent VERs 12/04/1999, 12/10/1999, and 02/01/2000 abnormal in the right eye. She began Avonex which she took for one year and switched to Rebif beginning 08/2000. She was then switched to Betaseron 04/2001 because her insurance would not pay for Rebif, but was subsequently switched back to Rebif 04/2003. Because of elevated liver function tests she was switched to Copaxone 07/03/2005.She has felt better on Copaxone then on the interferons. Her visual acuity varies from 20/30 to 20/40 in the left eye and to 20/200 in the right. She had burning paresthesias treated with gabapentin and at times amitriptyline. NMO blood test was negative 02/21/2005. She has been followed Dr. Estanislado Pandy, rheumatologist for the question of RA versus sarcoidosis and by Dr.Clinton Young for hypersomnolence without a diagnosis of narcolepsy or obstructive Sleep apnea. MRI studies of the brain and cervical spine 02/08/2010 showed periventricular lesions and unchanged lesion at C2-3. CBC and CMP 03/17/2009 were normal. CBC, CMP, TSH, lipid profile except LDL 151 were normal 08/07/09.vitamin D level 10/16/2009 was 41. She complains of fatigue and her right foot turning in an occasional foot cramping. She fell 10/2009 fracturing her right foot in a [employer] parking lot. In 2010 she fractured her left foot, left wrist, and her left arm. She has continued swelling  in her right foot and lower leg. She has an overactive bladder followed by Dr.Dalhstedt. She has a history of Lhermitte's sign worse with neck extension but also present with flexion. She had numbness on the right side of her body except her face  and head for one month.Her bladder symptoms improved on enablex.She walks 1.8 miles 3 times per week. 03/05/2011 she was lying in bed asleep and awoke trying to sit up and developed dizziness on 2 different occasions. She fell back in to the bed and went to sleep. On the third episode she became concerned. The episodes of dizziness would last seconds and are described as spinning. She had dizziness during the day without spinning She did not have head trauma or a cold. She was seen by Dr. Elder Cyphers at urgent care and underwent blood studies, EKG, and urinalysis. She has a history of migraine and a history of motion sickness. She has a Lhermitte's sign when she turns her head to the left with discomfort in her neck going into her left arm. This can occur 2 times per day and lasts Less than 1 minute. She has bladder symptoms and right flank pain and is concerned about another urinary tract infection. She can awaken with snoring when she has a cold. She sleeps well. She has dizziness 2 or 3 times per week when she lies down and turns her head to the left.   REVIEW OF SYSTEMS: Full 14 system review of systems performed and notable only for fatigue constipation heat intolerance freq waking cramps neck pain bladder incont.    ALLERGIES: Allergies  Allergen Reactions  . Nitrofurantoin Shortness Of Breath    Shortness of breath, violent chills  . Sulfonamide Derivatives Shortness Of Breath    Shortness of breath, chills  . Cefdinir     Can't remember what rx had 2 yrs ago    HOME MEDICATIONS: Outpatient Prescriptions Prior to Visit  Medication Sig Dispense Refill  . aspirin EC 81 MG tablet Take 1 tablet (81 mg total) by mouth daily.    . Biotin 1 MG CAPS Take by mouth.    . cholecalciferol (VITAMIN D) 1000 UNITS tablet Take 1,000 Units by mouth daily.    . citalopram (CELEXA) 20 MG tablet Take 1 tablet (20 mg total) by mouth daily. 30 tablet 11  . ibuprofen (ADVIL,MOTRIN) 200 MG tablet Take 2 tablets (400  mg total) by mouth 4 (four) times daily.    . meclizine (ANTIVERT) 25 MG tablet TAKE 1 TABLET BY MOUTH 3 TIMES DAILY AS NEEDED. 30 tablet 0  . Omega-3 Fatty Acids (FISH OIL) 1000 MG CAPS Take 1,000 mg by mouth 2 (two) times daily.   0  . TURMERIC PO Take 1 tablet by mouth daily.    Marland Kitchen gabapentin (NEURONTIN) 100 MG capsule Take 2 capsules (200 mg total) by mouth at bedtime. 60 capsule 3  . meloxicam (MOBIC) 15 MG tablet Take 1 tablet (15 mg total) by mouth daily. (Patient not taking: Reported on 08/23/2014) 30 tablet 3  . ondansetron (ZOFRAN-ODT) 4 MG disintegrating tablet DISSOLVE 1 TABLET ON TONGUE EVERY 6 HOURS AS NEEDED (Patient not taking: Reported on 08/23/2014) 60 tablet 0  . traMADol (ULTRAM) 50 MG tablet Take 1 tablet (50 mg total) by mouth at bedtime as needed. (Patient not taking: Reported on 08/23/2014) 30 tablet 0  . COPAXONE 40 MG/ML SOSY INJECT 40 MG INTO THE SKIN 3 TIMES A WEEK. 12 mL 12  . Vitamin D, Ergocalciferol, (DRISDOL)  50000 UNITS CAPS capsule Take 50,000 Units by mouth every 7 (seven) days.     No facility-administered medications prior to visit.   PHYSICAL EXAM  Filed Vitals:   08/23/14 1422  BP: 121/74  Pulse: 72  Height: 5\' 5"  (1.651 m)  Weight: 163 lb 6.4 oz (74.118 kg)   Body mass index is 27.19 kg/(m^2).  Physical Exam  General: Patient is awake, alert and in no acute distress. Well developed and groomed.  Neck: Neck is supple.  Cardiovascular: No carotid artery bruits. Heart is regular rate and rhythm with no murmurs.   Neurologic Exam  Mental Status: Awake, alert. Language is fluent and comprehension intact. Voice soft.  Cranial Nerves: Pupils are equal and reactive to light. Visual fields are full to confrontation. Conjugate eye movements are full and symmetric. Facial sensation and strength are symmetric. Hearing is intact. Palate elevated symmetrically and uvula is midline. Shoulder shrug is symmetric. Tongue is midline.    Motor: Normal bulk and tone.  NO TREMOR. NO BRADYKINESIA. Full strength in the upper and lower extremities. No pronator drift.  Sensory: Intact and symmetric to light touch.  Coordination: No ataxia or dysmetria on finger-nose or rapid alternating movement testing.  Reflexes: BUE 2, KNEES 2, ANKLES TRACE Gait and Station:  Normal stance, rising from chair without assistance, smooth turns, able to tandem walk.    DIAGNOSTIC DATA (LABS, IMAGING, TESTING) - I reviewed patient records, labs, notes, testing and imaging myself where available.  Lab Results  Component Value Date   WBC 6.1 05/18/2014   HGB 13.1 05/18/2014   HCT 38.1 05/18/2014   MCV 84 05/18/2014   PLT 299 05/18/2014   LYMPHOCYTES ABSOLUTE  Date Value Ref Range Status  05/18/2014 1.7 0.7 - 3.1 x10E3/uL Final   LYMPHS ABS  Date Value Ref Range Status  09/16/2011 1.8 0.7 - 4.0 K/uL Final  12/22/2010 0.7 0.7 - 4.0 K/uL Final  01/30/2010 1.5 0.7-4.0 K/uL Final   CMP Latest Ref Rng 05/18/2014 02/11/2014 05/27/2013  Glucose 65 - 99 mg/dL 91 84 -  BUN 8 - 27 mg/dL 16 14 -  Creatinine 0.57 - 1.00 mg/dL 0.90 0.78 -  Sodium 134 - 144 mmol/L 140 141 -  Potassium 3.5 - 5.2 mmol/L 4.1 4.7 -  Chloride 97 - 108 mmol/L 102 105 -  CO2 18 - 29 mmol/L 27 29 -  Calcium 8.7 - 10.3 mg/dL 9.7 9.2 -  Total Protein 6.0 - 8.5 g/dL 7.0 6.6 7.2  Albumin 3.6 - 4.8 g/dL 4.4 - -  Total Bilirubin 0.0 - 1.2 mg/dL 0.4 0.5 0.7  Alkaline Phos 39 - 117 IU/L 86 71 76  AST 0 - 40 IU/L 18 15 21   ALT 0 - 32 IU/L 16 14 15    Lab Results  Component Value Date   TSH 1.554 05/27/2013    07/08/13 MRI cervical spine (with and without) demonstrating:  1. At C4-5, C5-6, C6-7: disc bulging and facet hypertrophy with mild spinal stenosis and severe biforaminal foraminal stenosis; no cord signal changes.  2. At C7-T1: uncovertebral joint hypertrophy and facet hypertrophy with moderate-severe right and mild left foraminal stenosis.  3. At C3-4: uncovertebral joint hypertrophy and facet  hypertrophy with moderate right and mild left foraminal stenosis.  4. At C2-3: uncovertebral joint hypertrophy and facet hypertrophy with moderate right foraminal stenosis.  5. Degenerative disc and endplate disease J9-4 to C6-7 with marrow edema/inflammation and type 1 Modic changes.  6. No intrinsic or abnormal enhancing spinal  cord lesions.  7. Compared to MRI on 07/11/03 there has been progression of degenerative spine disease, and the C2-3 spinal cord lesion is no longer seen.  05/26/14 MRI brain (with and without) demonstrating: 1. Scattered periventricular and subcortical T2 hyperintensities, consistent with chronic demyelinating disease. No acute plaques.  2. No change from MRI on 03/09/13.   ASSESSMENT AND PLAN  64 y.o. female here with multiple sclerosis since 2001, on copaxone since 2007. Previously on avonex and betaseron.  Now intolerant to copaxone with 2 events of tingling, throat tightness, thick tongue with 2 injections. Could be allergic reaction or other side effect. Now on tecfidera since March 2016. More incontinence, fatigue and numbness. Will check workup.  PLAN: 1. Continue tecfidera  2. Check MRI brain / c-spine and labs   Orders Placed This Encounter  Procedures  . MR Brain W Wo Contrast  . MR Cervical Spine W Wo Contrast  . CBC with Differential/Platelet  . Comprehensive metabolic panel   Return in about 3 months (around 11/22/2014).    Penni Bombard, MD 06/04/154, 1:53 PM Certified in Neurology, Neurophysiology and Neuroimaging  [facility] [address], [place],  [address] [phone]

## 2014-08-24 LAB — CBC WITH DIFFERENTIAL/PLATELET
Basophils Absolute: 0 10*3/uL (ref 0.0–0.2)
Basos: 1 %
EOS (ABSOLUTE): 0.1 10*3/uL (ref 0.0–0.4)
Eos: 2 %
HEMATOCRIT: 39.2 % (ref 34.0–46.6)
Hemoglobin: 13.2 g/dL (ref 11.1–15.9)
Immature Grans (Abs): 0 10*3/uL (ref 0.0–0.1)
Immature Granulocytes: 0 %
LYMPHS ABS: 2.3 10*3/uL (ref 0.7–3.1)
LYMPHS: 30 %
MCH: 27.8 pg (ref 26.6–33.0)
MCHC: 33.7 g/dL (ref 31.5–35.7)
MCV: 83 fL (ref 79–97)
MONOS ABS: 0.7 10*3/uL (ref 0.1–0.9)
Monocytes: 9 %
NEUTROS ABS: 4.5 10*3/uL (ref 1.4–7.0)
Neutrophils: 58 %
Platelets: 276 10*3/uL (ref 150–379)
RBC: 4.75 x10E6/uL (ref 3.77–5.28)
RDW: 15.3 % (ref 12.3–15.4)
WBC: 7.6 10*3/uL (ref 3.4–10.8)

## 2014-08-24 LAB — COMPREHENSIVE METABOLIC PANEL
ALT: 17 IU/L (ref 0–32)
AST: 17 IU/L (ref 0–40)
Albumin/Globulin Ratio: 2.2 (ref 1.1–2.5)
Albumin: 5.2 g/dL — ABNORMAL HIGH (ref 3.6–4.8)
Alkaline Phosphatase: 77 IU/L (ref 39–117)
BILIRUBIN TOTAL: 0.4 mg/dL (ref 0.0–1.2)
BUN / CREAT RATIO: 17 (ref 11–26)
BUN: 14 mg/dL (ref 8–27)
CALCIUM: 10 mg/dL (ref 8.7–10.3)
CO2: 24 mmol/L (ref 18–29)
Chloride: 98 mmol/L (ref 97–108)
Creatinine, Ser: 0.84 mg/dL (ref 0.57–1.00)
GFR calc Af Amer: 86 mL/min/{1.73_m2} (ref >59)
GFR, EST NON AFRICAN AMERICAN: 75 mL/min/{1.73_m2} (ref >59)
GLOBULIN, TOTAL: 2.4 g/dL (ref 1.5–4.5)
Glucose: 84 mg/dL (ref 65–99)
Potassium: 5.7 mmol/L — ABNORMAL HIGH (ref 3.5–5.2)
SODIUM: 138 mmol/L (ref 134–144)
Total Protein: 7.6 g/dL (ref 6.0–8.5)

## 2014-08-31 ENCOUNTER — Other Ambulatory Visit: Payer: 59

## 2014-08-31 ENCOUNTER — Ambulatory Visit (INDEPENDENT_AMBULATORY_CARE_PROVIDER_SITE_OTHER): Payer: 59

## 2014-08-31 DIAGNOSIS — G35 Multiple sclerosis: Secondary | ICD-10-CM

## 2014-08-31 MED ORDER — GADOPENTETATE DIMEGLUMINE 469.01 MG/ML IV SOLN: 17.0000 mL | Freq: Once | INTRAVENOUS | Status: AC | PRN

## 2014-09-28 ENCOUNTER — Encounter: Payer: Self-pay | Admitting: Family Medicine

## 2014-10-03 ENCOUNTER — Ambulatory Visit: Payer: 59 | Admitting: Family Medicine

## 2014-10-04 ENCOUNTER — Ambulatory Visit (INDEPENDENT_AMBULATORY_CARE_PROVIDER_SITE_OTHER): Payer: 59 | Admitting: Family Medicine

## 2014-10-04 ENCOUNTER — Encounter: Payer: Self-pay | Admitting: Family Medicine

## 2014-10-04 VITALS — BP 120/82 | HR 69 | Ht 65.0 in | Wt 175.0 lb

## 2014-10-04 DIAGNOSIS — M545 Low back pain, unspecified: Secondary | ICD-10-CM

## 2014-10-04 DIAGNOSIS — M5030 Other cervical disc degeneration, unspecified cervical region: Secondary | ICD-10-CM

## 2014-10-04 MED ORDER — GABAPENTIN 100 MG PO CAPS: 200.0000 mg | ORAL_CAPSULE | Freq: Every day | ORAL | Status: DC

## 2014-10-04 NOTE — Assessment & Plan Note (Signed)
Patient given home exercises, icing protocol, and postural changes patient can make it work. Patient and will come back and see me again in 3-4 weeks.

## 2014-10-04 NOTE — Progress Notes (Signed)
Pre visit review using our clinic review tool, if applicable. No additional management support is needed unless otherwise documented below in the visit note. 

## 2014-10-04 NOTE — Progress Notes (Signed)
[facility] [address], [place] [address] Phone: [phone] Subjective:     CC: Neck and back pain  [id number] Katie Maldonado is a 64 y.o. female coming in with complaint of neck and back pain. Patient was seen another provider and was concern that she was given a multiple sclerosis flare. Patient did have a workup including an MRI of the brain and the cervical spine that did show significant degenerative disc disease and osteophytic changes but no increasing signal that would correspond with a multiple sclerosis flare. Patient did have a change in medications recently which could be somewhat contributing she thinks. Patient describes it as more of a dull throbbing aching sensation that is worse with certain activities. States that at night she can have a significant amount of pain as well. Denies any fevers or chills or any abnormal weight loss and if anything has had weight gain recently. Patient states that this is not stopping her from any activities but she is much more uncomfortable at the end of the day. States that she feels that she has tightness on the back of her legs bilaterally. Would not call and numbing or weakness.    we did review patient's recent MRI and is some of the head and neck today. We discussed the findings previously. MRI cervical Severe degenerative changes at C4-C5, C5-C6 and C6-C7 with there is disc protrusion, uncovertebral spurring and facet hypertrophy. There is mild spinal stenosis at each of these 3 levels. There is also moderately severe foraminal narrowing which could lead to impingement of the exiting C5, C6 and C7 nerve roots. 2. Degenerative changes are milder at C2-C3, C3-C4 and C7-T1 and are less likely to lead to nerve root impingement.  3. The spinal cord appears normal.  4. There are no enhancing abnormalities.  5. Compared to the MRI of the cervical spine dated 07/07/2013, there is no definite  interval change.   2013 in 2014 lumbar x-rays show no significant bony abnormality with minimal osteophytic changes.  Past medical history, social, surgical and family history all reviewed in electronic medical record.   Review of Systems: No headache, visual changes, nausea, vomiting, diarrhea, constipation, dizziness, abdominal pain, skin rash, fevers, chills, night sweats, weight loss, swollen lymph nodes, body aches, joint swelling, muscle aches, chest pain, shortness of breath, mood changes.   Objective Blood pressure 120/82, pulse 69, height 5\' 5"  (1.651 m), weight 175 lb (79.379 kg), SpO2 95 %.  General: No apparent distress alert and oriented x3 mood and affect normal, dressed appropriately.  HEENT: Pupils equal, extraocular movements intact  Respiratory: Patient's speak in full sentences and does not appear short of breath  Cardiovascular: No lower extremity edema, non tender, no erythema  Skin: Warm dry intact with no signs of infection or rash on extremities or on axial skeleton.  Abdomen: Soft nontender  Neuro: Cranial nerves II through XII are intact, neurovascularly intact in all extremities with 2+ DTRs and 2+ pulses.  Lymph: No lymphadenopathy of posterior or anterior cervical chain or axillae bilaterally.  Gait normal with good balance and coordination.  MSK:  Non tender with full range of motion and good stability and symmetric strength and tone of shoulders, elbows, wrist, hip, knee and ankles bilaterally.  Neck: Inspection unremarkable. No palpable stepoffs. Negative Spurling's maneuver. Lacks 5 of flexion as well as extension. Mild limitation and rotation bilaterally. Grip strength and sensation normal in bilateral hands Strength good C4 to T1 distribution  No sensory change to C4 to T1 Negative Hoffman sign bilaterally Reflexes normal Back Exam:  Inspection: Unremarkable  Motion: Flexion 45 deg, Extension 45 deg, Side Bending to 45 deg bilaterally,  Rotation to  45 deg bilaterally  SLR laying: Negative  XSLR laying: Negative  Palpable tenderness: Diffuse tenderness of the paraspinal musculatures of the thoracolumbar and lumbar juncture. FABER: Tightness bilaterally Sensory change: Gross sensation intact to all lumbar and sacral dermatomes.  Reflexes: 2+ at both patellar tendons, 2+ at achilles tendons, Babinski's downgoing.  Strength at foot  Plantar-flexion: 5/5 Dorsi-flexion: 5/5 Eversion: 5/5 Inversion: 5/5  Leg strength  Quad: 5/5 Hamstring: 5/5 Hip flexor: 5/5 Hip abductors: 4/5  Gait unremarkable.    Impression and Recommendations:     This case required medical decision making of moderate complexity.

## 2014-10-04 NOTE — Assessment & Plan Note (Signed)
Patient's low back pain is likely multifactorial. I do think the patient is having some muscle imbalances and patient has had some mild weight gain since previous exam. We discussed home exercises and doing the exercises on a more regular basis. Patient has not been sticking to a regimen as much. I do think looking at patient's labs that she could be having somewhat of an iron deficiency that could also be contributing and patient will start taking iron 3 times a week. Patient will continue her other medications at this time. We discussed proper dosing of the over-the-counter natural supplementations. Patient does have tramadol for breakthrough pain but we will also start gabapentin to see if this is some neuritis that could be secondary to her underlying pathology. Patient and will come back and see me again in 3 weeks for further evaluation and treatment.  Spent  25 minutes with patient face-to-face and had greater than 50% of counseling including as described above in assessment and plan.

## 2014-10-04 NOTE — Patient Instructions (Addendum)
Good to see you as always Iron 325 mg 3 times a week Gabapentin 100mg  nightly for first week then 200mg  nightly thereafter.  Heat before activity and ice afterward.  Ok to do exercises but stretch afterward.  Consider tart cherry extract.  Check in with me in 2 weeks.

## 2014-10-10 ENCOUNTER — Other Ambulatory Visit: Payer: Self-pay | Admitting: Family Medicine

## 2014-10-13 NOTE — Telephone Encounter (Signed)
I see that she last saw Dr Leward Quan for her IBS about 8 months ago. I am ok to fill her zofran for her one more time but she needs to come in to be seen for any further refills of this medication as she needs to be evaluated for this since we haven't seen her in 8 months.

## 2014-10-13 NOTE — Telephone Encounter (Signed)
Can we refill? I am unsure.

## 2014-11-28 ENCOUNTER — Ambulatory Visit (INDEPENDENT_AMBULATORY_CARE_PROVIDER_SITE_OTHER): Payer: 59 | Admitting: Diagnostic Neuroimaging

## 2014-11-28 ENCOUNTER — Encounter: Payer: Self-pay | Admitting: Diagnostic Neuroimaging

## 2014-11-28 VITALS — BP 98/68 | HR 83 | Ht 65.0 in | Wt 173.0 lb

## 2014-11-28 DIAGNOSIS — G35 Multiple sclerosis: Secondary | ICD-10-CM

## 2014-11-28 NOTE — Progress Notes (Signed)
PATIENT: Katie Maldonado DOB: 11-15-1950   REASON FOR VISIT: follow up for MS HISTORY FROM: patient  Chief Complaint  Patient presents with  . MS    rm 7,   . Follow-up     HISTORY OF PRESENT ILLNESS:  UPDATE 11/28/14: Since last visit, continues to have prickly, tingling sensation in whole body, hot flash sensations, pain, neck issues. Had 2 falls, but no injuries.  UPDATE 62/26/16: Since last visit, has been on tecfidera since beginning March 2016. She has noted more general numbness, fatigue, constipation, drawstring sensation in calves/hamstrings in past 3 weeks.   UPDATE 62/20/16: MS symptoms stable. Has had 2 reactions to copaxone, leading to tinging, prickly sensations over whole body. 1 event led to throat tightness, tongue feeling thick, and worried patient. Here to discuss possibilities of switching therapies.  UPDATE 01/04/14 (VRP): Since last visit, doing the same. No new neuro symptoms. Does report persistent fatigue, weight gain, incr appetite, feeling hot. Tolerating copaxone. Misses 2-3 injections per month.   UPDATE 06/30/13 (LL):  Since last visit patient continues to have problems with fatigue and malaise, numbness and paresthesias in left arm which is new for her, started a couple weeks ago.  She states it is constant, does not hurt, but annoying.  She is still able to work.  Follow up MRI brain at last visit showed no acute or new lesions, with moderate cerebral atrophy.  UPDATE 12/30/12 (VP): Since last visit patient continues to have problems with fatigue, insomnia, anxiety, shortness of breath. For past 6 months she's been having dyspnea on exertion especially climbing steps at her work. She has been evaluated by PCP without specific diagnosis. Patient continues on Copaxone (now 3 times per week dosing). Last MRI brain was in 2011. Regarding fatigue she has been on Provigil and Ritalin in the past without relief. Regarding anxiety she was on Paxil in the past but  this caused nausea and stomach problems.   PRIOR HPI (05/02/12, Dr. 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 Her MRI of brain 06/10/1999 showed white matter abnormalities not typical for MS and MRI of the cervical spine without contrast 07/27/99 showed a focal area of abnormality in the spinal cord at C2-3. She had positive CSF IgG index and oligoclonal banding. Initially the VER was normal with subsequent VERs 12/04/1999, 12/10/1999, and 02/01/2000 abnormal in the right eye. She began Avonex which she took for one year and switched to Rebif beginning 08/2000. She was then switched to Betaseron 04/2001 because her insurance would not pay for Rebif, but was subsequently switched back to Rebif 04/2003. Because of elevated liver function tests she was switched to Copaxone 07/03/2005.She has felt better on Copaxone then on the interferons. Her visual acuity varies from 20/30 to 20/40 in the left eye and to 20/200 in the right. She had burning paresthesias treated with gabapentin and at times amitriptyline. NMO blood test was negative 02/21/2005. She has been followed Dr. Estanislado Pandy, rheumatologist for the question of RA versus sarcoidosis and by Dr.Clinton Young for hypersomnolence without a diagnosis of narcolepsy or obstructive Sleep apnea. MRI studies of the brain and cervical spine 02/08/2010 showed periventricular lesions and unchanged lesion at C2-3. CBC and CMP 03/17/2009 were normal. CBC, CMP, TSH, lipid profile except LDL 151 were normal 08/07/09.vitamin D level 10/16/2009 was 41. She complains of fatigue and her right foot turning in an occasional  foot cramping. She fell 10/2009 fracturing her right foot in a [employer] parking lot. In 2010 she fractured her left foot, left wrist, and her left arm. She has continued swelling in her right foot and lower leg. She has an overactive bladder followed by  Dr.Dalhstedt. She has a history of Lhermitte's sign worse with neck extension but also present with flexion. She had numbness on the right side of her body except her face and head for one month.Her bladder symptoms improved on enablex.She walks 1.8 miles 3 times per week. 03/05/2011 she was lying in bed asleep and awoke trying to sit up and developed dizziness on 2 different occasions. She fell back in to the bed and went to sleep. On the third episode she became concerned. The episodes of dizziness would last seconds and are described as spinning. She had dizziness during the day without spinning She did not have head trauma or a cold. She was seen by Dr. Elder Cyphers at urgent care and underwent blood studies, EKG, and urinalysis. She has a history of migraine and a history of motion sickness. She has a Lhermitte's sign when she turns her head to the left with discomfort in her neck going into her left arm. This can occur 2 times per day and lasts Less than 1 minute. She has bladder symptoms and right flank pain and is concerned about another urinary tract infection. She can awaken with snoring when she has a cold. She sleeps well. She has dizziness 2 or 3 times per week when she lies down and turns her head to the left.   REVIEW OF SYSTEMS: Full 14 system review of systems performed and notable only for fatigue constipation heat intolerance freq waking cramps neck pain bladder incont.    ALLERGIES: Allergies  Allergen Reactions  . Nitrofurantoin Shortness Of Breath    Shortness of breath, violent chills  . Sulfonamide Derivatives Shortness Of Breath    Shortness of breath, chills  . Cefdinir     Can't remember what rx had 2 yrs ago    HOME MEDICATIONS: Outpatient Prescriptions Prior to Visit  Medication Sig Dispense Refill  . aspirin EC 81 MG tablet Take 1 tablet (81 mg total) by mouth daily.    . Biotin 1 MG CAPS Take by mouth.    . cholecalciferol (VITAMIN D) 1000 UNITS tablet Take 1,000 Units by  mouth daily.    . citalopram (CELEXA) 20 MG tablet Take 1 tablet (20 mg total) by mouth daily. 30 tablet 11  . Dimethyl Fumarate 240 MG CPDR Take 1 capsule by mouth 2 (two) times daily.    Marland Kitchen gabapentin (NEURONTIN) 100 MG capsule Take 2 capsules (200 mg total) by mouth at bedtime. 60 capsule 3  . ibuprofen (ADVIL,MOTRIN) 200 MG tablet Take 2 tablets (400 mg total) by mouth 4 (four) times daily.    . meclizine (ANTIVERT) 25 MG tablet TAKE 1 TABLET BY MOUTH 3 TIMES DAILY AS NEEDED. 30 tablet 0  . meloxicam (MOBIC) 15 MG tablet Take 1 tablet (15 mg total) by mouth daily. 30 tablet 3  . Omega-3 Fatty Acids (FISH OIL) 1000 MG CAPS Take 1,000 mg by mouth 2 (two) times daily.   0  . ondansetron (ZOFRAN-ODT) 4 MG disintegrating tablet DISSOLVE 1 TABLET ON TONGUE EVERY 6 HOURS AS NEEDED 60 tablet 0  . traMADol (ULTRAM) 50 MG tablet Take 1 tablet (50 mg total) by mouth at bedtime as needed. 30 tablet 0  . TURMERIC PO Take 1  tablet by mouth daily.     No facility-administered medications prior to visit.   PHYSICAL EXAM  Filed Vitals:   11/28/14 1526  BP: 98/68  Pulse: 83  Height: 5\' 5"  (1.651 m)  Weight: 173 lb (78.472 kg)   Body mass index is 28.79 kg/(m^2).  Physical Exam  General: Patient is awake, alert and in no acute distress. Well developed and groomed.  Neck: Neck is supple.  Cardiovascular: No carotid artery bruits. Heart is regular rate and rhythm with no murmurs.   Neurologic Exam  Mental Status: Awake, alert. Language is fluent and comprehension intact. Voice soft.  Cranial Nerves: Pupils are equal and reactive to light. Visual fields are full to confrontation. Conjugate eye movements are full and symmetric. Facial sensation and strength are symmetric. Hearing is intact. Palate elevated symmetrically and uvula is midline. Shoulder shrug is symmetric. Tongue is midline.    Motor: Normal bulk and tone. NO TREMOR. NO BRADYKINESIA. Full strength in the upper and lower extremities. No  pronator drift.  Sensory: Intact and symmetric to light touch.  Coordination: No ataxia or dysmetria on finger-nose or rapid alternating movement testing.  Reflexes: BUE 2, KNEES 2, ANKLES TRACE Gait and Station:  Normal stance, rising from chair without assistance, smooth turns, able to tandem walk.    DIAGNOSTIC DATA (LABS, IMAGING, TESTING) - I reviewed patient records, labs, notes, testing and imaging myself where available.  Lab Results  Component Value Date   WBC 7.6 08/23/2014   HGB 13.1 05/18/2014   HCT 39.2 08/23/2014   MCV 84 05/18/2014   PLT 299 05/18/2014   LYMPHOCYTES ABSOLUTE  Date Value Ref Range Status  08/23/2014 2.3 0.7 - 3.1 x10E3/uL Final  05/18/2014 1.7 0.7 - 3.1 x10E3/uL Final   LYMPHS ABS  Date Value Ref Range Status  09/16/2011 1.8 0.7 - 4.0 K/uL Final  12/22/2010 0.7 0.7 - 4.0 K/uL Final  01/30/2010 1.5 0.7-4.0 K/uL Final   CMP Latest Ref Rng 08/23/2014 05/18/2014 02/11/2014  Glucose 65 - 99 mg/dL 84 91 84  BUN 8 - 27 mg/dL 14 16 14   Creatinine 0.57 - 1.00 mg/dL 0.84 0.90 0.78  Sodium 134 - 144 mmol/L 138 140 141  Potassium 3.5 - 5.2 mmol/L 5.7(H) 4.1 4.7  Chloride 97 - 108 mmol/L 98 102 105  CO2 18 - 29 mmol/L 24 27 29   Calcium 8.7 - 10.3 mg/dL 10.0 9.7 9.2  Total Protein 6.0 - 8.5 g/dL 7.6 7.0 6.6  Albumin 3.6 - 4.8 g/dL 5.2(H) 4.4 -  Total Bilirubin 0.0 - 1.2 mg/dL 0.4 0.4 0.5  Alkaline Phos 39 - 117 IU/L 77 86 71  AST 0 - 40 IU/L 17 18 15   ALT 0 - 32 IU/L 17 16 14    Lab Results  Component Value Date   TSH 1.554 05/27/2013    08/31/14 MRI brain - showing T2/flair hyperintense foci in the periventricular, deep and subcortical matter in a pattern and configuration consistent with the diagnosis of multiple sclerosis. No acute foci are noted. Generalized cortical atrophy is noted. When compared to a study dated 05/26/2014, there is no interval change.  08/31/14 MRI cervical spine -  1. Severe degenerative changes at C4-C5, C5-C6 and C6-C7 with  there is disc protrusion, uncovertebral spurring and facet hypertrophy. There is mild spinal stenosis at each of these 3 levels. There is also moderately severe foraminal narrowing which could lead to impingement of the exiting C5, C6 and C7 nerve roots. 2. Degenerative changes are milder  at C2-C3, C3-C4 and C7-T1 and are less likely to lead to nerve root impingement.  3. The spinal cord appears normal.  4. There are no enhancing abnormalities.  5. Compared to the MRI of the cervical spine dated 07/07/2013, there is no definite interval change.    ASSESSMENT AND PLAN  64 y.o. female here with multiple sclerosis since 2001, on copaxone since 2007. Previously on avonex and betaseron.  Then apparent intolerance to copaxone with 2 events of tingling, throat tightness, thick tongue with 2 injections. Now on tecfidera since March 2016. Still with diffuse prickly, pain, tingling, sensations, likely MS related, not medicine side effect.   PLAN: I spent 25 minutes of face to face time with patient. Greater than 50% of time was spent in counseling and coordination of care with patient. In summary we discussed:  1. Continue tecfidera  2. May consider higher gabapentin for pins/needles pain sensation 3. Repeat MRI and labs in May 2017  Return in about 6 months (around 05/31/2015).    Penni Bombard, MD 11/01/1468, 9:29 PM Certified in Neurology, Neurophysiology and Neuroimaging  [facility] [address] [place], [place] [address] [phone]

## 2014-12-22 ENCOUNTER — Ambulatory Visit (INDEPENDENT_AMBULATORY_CARE_PROVIDER_SITE_OTHER): Payer: 59 | Admitting: Emergency Medicine

## 2014-12-22 VITALS — BP 124/82 | HR 82 | Temp 99.4°F | Resp 18 | Ht 64.0 in | Wt 171.0 lb

## 2014-12-22 DIAGNOSIS — N3090 Cystitis, unspecified without hematuria: Secondary | ICD-10-CM | POA: Diagnosis not present

## 2014-12-22 DIAGNOSIS — R300 Dysuria: Secondary | ICD-10-CM

## 2014-12-22 LAB — POCT URINALYSIS DIPSTICK
Bilirubin, UA: NEGATIVE
Glucose, UA: NEGATIVE
Ketones, UA: NEGATIVE
Nitrite, UA: POSITIVE
PROTEIN UA: NEGATIVE
Spec Grav, UA: <=1.005
UROBILINOGEN UA: 0.2
pH, UA: 6

## 2014-12-22 LAB — POCT UA - MICROSCOPIC ONLY
Casts, Ur, LPF, POC: NEGATIVE
Crystals, Ur, HPF, POC: NEGATIVE
Mucus, UA: NEGATIVE
Yeast, UA: NEGATIVE

## 2014-12-22 MED ORDER — PHENAZOPYRIDINE HCL 200 MG PO TABS
200.0000 mg | ORAL_TABLET | Freq: Three times a day (TID) | ORAL | Status: DC | PRN
Start: 2014-12-22 — End: 2015-02-21

## 2014-12-22 MED ORDER — CIPROFLOXACIN HCL 500 MG PO TABS: 500.0000 mg | ORAL_TABLET | Freq: Two times a day (BID) | ORAL | Status: DC

## 2014-12-22 NOTE — Progress Notes (Signed)
Subjective:  Patient ID: Katie Maldonado, female    DOB: 11-14-1950  Age: 64 y.o. MRN: [id number]  CC: Cough and Dysuria   HPI [name] presents  as dysuria urgency and frequency. She has no blood in her urine. Has no vaginal discharge or bleeding. She has no fever or chills. No abdominal pain or back pain. Has taken no medication for this. She has a history of previous urinary tract infections  History Katie Maldonado has a past medical history of Multiple sclerosis; Other and unspecified hyperlipidemia; Obesity, unspecified; Allergy; Arthritis; Neuromuscular disorder; Osteoporosis; TIA (transient ischemic attack) (20); Migraine; and Hypersomnolence.   She has past surgical history that includes Hernia repair; Tubal ligation; Wrist surgery; arm surgery left; and Abdominal hysterectomy.   Her  family history includes Cancer in her brother, maternal grandfather, maternal grandmother, paternal grandfather, and paternal grandmother; Emphysema in her father; Heart attack in her brother; Heart disease in her brother.  She   reports that she quit smoking about 10 years ago. Her smoking use included Cigarettes. She quit after 37 years of use. She has never used smokeless tobacco. She reports that she drinks alcohol. She reports that she does not use illicit drugs.  Outpatient Prescriptions Prior to Visit  Medication Sig Dispense Refill  . aspirin EC 81 MG tablet Take 1 tablet (81 mg total) by mouth daily.    . Biotin 1 MG CAPS Take by mouth.    . cholecalciferol (VITAMIN D) 1000 UNITS tablet Take 1,000 Units by mouth daily.    . citalopram (CELEXA) 20 MG tablet Take 1 tablet (20 mg total) by mouth daily. 30 tablet 11  . ferrous sulfate 325 (65 FE) MG tablet Take 325 mg by mouth daily with breakfast. Three times a week    . gabapentin (NEURONTIN) 100 MG capsule Take 2 capsules (200 mg total) by mouth at bedtime. 60 capsule 3  . ibuprofen (ADVIL,MOTRIN) 200 MG tablet Take 2 tablets  (400 mg total) by mouth 4 (four) times daily.    . meclizine (ANTIVERT) 25 MG tablet TAKE 1 TABLET BY MOUTH 3 TIMES DAILY AS NEEDED. 30 tablet 0  . Omega-3 Fatty Acids (FISH OIL) 1000 MG CAPS Take 1,000 mg by mouth 2 (two) times daily.   0  . ondansetron (ZOFRAN-ODT) 4 MG disintegrating tablet DISSOLVE 1 TABLET ON TONGUE EVERY 6 HOURS AS NEEDED 60 tablet 0  . TURMERIC PO Take 1 tablet by mouth daily.    . Dimethyl Fumarate 240 MG CPDR Take 1 capsule by mouth 2 (two) times daily.    . meloxicam (MOBIC) 15 MG tablet Take 1 tablet (15 mg total) by mouth daily. (Patient not taking: Reported on 12/22/2014) 30 tablet 3  . traMADol (ULTRAM) 50 MG tablet Take 1 tablet (50 mg total) by mouth at bedtime as needed. (Patient not taking: Reported on 12/22/2014) 30 tablet 0   No facility-administered medications prior to visit.    Social History   Social History  . Marital Status: Married    Spouse Name: Dyke Brackett"  . Number of Children: 2  . Years of Education: college   Occupational History  . 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 [employer]   Social History Main Topics  . Smoking status: Former Smoker -- 37 years    Types: Cigarettes    Quit date: 02/26/2004  . Smokeless tobacco: Never Used  . Alcohol Use: 0.0 oz/week     Comment: occ  . Drug Use:  No  . Sexual Activity: Yes    Birth Control/ Protection: None   Other Topics Concern  . None   Social History Narrative   Patient is married Katie Maldonado), has 2 children   Patient is right handed   Education level is 2 yrs of college   Caffeine consumption is 3-4 cups daily     Review of Systems  Constitutional: Negative for fever, chills and appetite change.  HENT: Negative for congestion, ear pain, postnasal drip, sinus pressure and sore throat.   Eyes: Negative for pain and redness.  Respiratory: Negative for cough, shortness of breath and wheezing.   Cardiovascular: Negative for leg swelling.  Gastrointestinal:  Negative for nausea, vomiting, abdominal pain, diarrhea, constipation and blood in stool.  Endocrine: Negative for polyuria.  Genitourinary: Negative for dysuria, urgency, frequency and flank pain.  Musculoskeletal: Negative for gait problem.  Skin: Negative for rash.  Neurological: Negative for weakness and headaches.  Psychiatric/Behavioral: Negative for confusion and decreased concentration. The patient is not nervous/anxious.     Objective:  BP 124/82 mmHg  Pulse 82  Temp(Src) 99.4 F (37.4 C)  Resp 18  Ht 5\' 4"  (1.626 m)  Wt 171 lb (77.565 kg)  BMI 29.34 kg/m2  SpO2 97%  Physical Exam  Constitutional: She is oriented to person, place, and time. She appears well-developed and well-nourished. No distress.  HENT:  Head: Normocephalic and atraumatic.  Right Ear: External ear normal.  Left Ear: External ear normal.  Nose: Nose normal.  Eyes: Conjunctivae and EOM are normal. Pupils are equal, round, and reactive to light. No scleral icterus.  Neck: Normal range of motion. Neck supple. No tracheal deviation present.  Cardiovascular: Normal rate, regular rhythm and normal heart sounds.   Pulmonary/Chest: Effort normal. No respiratory distress. She has no wheezes. She has no rales.  Abdominal: She exhibits no mass. There is no tenderness. There is no rebound and no guarding.  Musculoskeletal: She exhibits no edema.  Lymphadenopathy:    She has no cervical adenopathy.  Neurological: She is alert and oriented to person, place, and time. Coordination normal.  Skin: Skin is warm and dry. No rash noted.  Psychiatric: She has a normal mood and affect. Her behavior is normal.      Assessment & Plan:   Davan was seen today for cough and dysuria.  Diagnoses and all orders for this visit:  Cystitis  Dysuria -     POCT UA - Microscopic Only -     POCT urinalysis dipstick  Other orders -     phenazopyridine (PYRIDIUM) 200 MG tablet; Take 1 tablet (200 mg total) by mouth 3  (three) times daily as needed. -     ciprofloxacin (CIPRO) 500 MG tablet; Take 1 tablet (500 mg total) by mouth 2 (two) times daily.   I am having Ms. Dignan start on phenazopyridine and ciprofloxacin. I am also having her maintain her ibuprofen, aspirin EC, Fish Oil, meclizine, meloxicam, traMADol, cholecalciferol, TURMERIC PO, Biotin, citalopram, Dimethyl Fumarate, gabapentin, ondansetron, and ferrous sulfate.  Meds ordered this encounter  Medications  . phenazopyridine (PYRIDIUM) 200 MG tablet    Sig: Take 1 tablet (200 mg total) by mouth 3 (three) times daily as needed.    Dispense:  6 tablet    Refill:  0  . ciprofloxacin (CIPRO) 500 MG tablet    Sig: Take 1 tablet (500 mg total) by mouth 2 (two) times daily.    Dispense:  20 tablet    Refill:  0    Appropriate red flag conditions were discussed with the patient as well as actions that should be taken.  Patient expressed his understanding.  Follow-up: Return if symptoms worsen or fail to improve.  Roselee Culver, MD   Results for orders placed or performed in visit on 12/22/14  POCT UA - Microscopic Only  Result Value Ref Range   WBC, Ur, HPF, POC 20+    RBC, urine, microscopic 0-3    Bacteria, U Microscopic 3+    Mucus, UA Negative    Epithelial cells, urine per micros 0-3    Crystals, Ur, HPF, POC Negative    Casts, Ur, LPF, POC Negative    Yeast, UA Negative   POCT urinalysis dipstick  Result Value Ref Range   Color, UA yellow    Clarity, UA sl. cloudy    Glucose, UA negative    Bilirubin, UA negative    Ketones, UA negative    Spec Grav, UA <=1.005    Blood, UA trace-lysed    pH, UA 6.0    Protein, UA negative    Urobilinogen, UA 0.2    Nitrite, UA positive    Leukocytes, UA large (3+) (A) Negative

## 2014-12-22 NOTE — Patient Instructions (Signed)

## 2015-01-09 ENCOUNTER — Ambulatory Visit: Payer: 59 | Admitting: Diagnostic Neuroimaging

## 2015-01-09 DIAGNOSIS — Z0289 Encounter for other administrative examinations: Secondary | ICD-10-CM

## 2015-01-13 ENCOUNTER — Other Ambulatory Visit: Payer: Self-pay

## 2015-01-13 DIAGNOSIS — Z1231 Encounter for screening mammogram for malignant neoplasm of breast: Secondary | ICD-10-CM

## 2015-01-17 ENCOUNTER — Telehealth: Payer: Self-pay | Admitting: *Deleted

## 2015-01-17 NOTE — Telephone Encounter (Signed)
Form,[employer] sent to [facility] C and Dr Leta Baptist  01/17/15.

## 2015-01-18 ENCOUNTER — Telehealth: Payer: Self-pay | Admitting: *Deleted

## 2015-01-18 NOTE — Telephone Encounter (Signed)
Left vm for patient requesting she call back re: FMLA papers. Left this caller's name, number and best dates/time to call.

## 2015-01-19 NOTE — Telephone Encounter (Signed)
Patient is returning a call regarding her FMLA paperwork. 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

## 2015-01-19 NOTE — Telephone Encounter (Signed)
Attempted to call patient back at number listed. Unable to reach "busy" after several attempts.

## 2015-01-23 NOTE — Telephone Encounter (Signed)
Spoke with patient and received additional information to complete her FMLA papers. Advised her papers will be completed tomorrow. She verbalized understanding.

## 2015-01-24 NOTE — Telephone Encounter (Addendum)
FMLA papers on Dr [employer] desk for review, completion, signature.  10:06 am FMLA papers completed, signed, sent to MR for scanning, processing.

## 2015-01-25 NOTE — Telephone Encounter (Signed)
error 

## 2015-01-30 ENCOUNTER — Encounter: Payer: Self-pay | Admitting: Family Medicine

## 2015-01-31 ENCOUNTER — Telehealth: Payer: Self-pay | Admitting: *Deleted

## 2015-01-31 NOTE — Telephone Encounter (Signed)
Form,[employer] received,completed by Dr Leta Baptist and [facility] C,patient signed a release faxed  01/31/15.

## 2015-02-15 ENCOUNTER — Other Ambulatory Visit: Payer: Self-pay

## 2015-02-15 MED ORDER — CITALOPRAM HYDROBROMIDE 20 MG PO TABS: 20.0000 mg | ORAL_TABLET | Freq: Every day | ORAL | Status: DC

## 2015-02-15 NOTE — Telephone Encounter (Signed)
appt 10/25

## 2015-02-17 ENCOUNTER — Telehealth: Payer: Self-pay

## 2015-02-17 NOTE — Telephone Encounter (Signed)
Left message for pt to call back  °

## 2015-02-17 NOTE — Telephone Encounter (Signed)
What does the patient need this medication for?

## 2015-02-17 NOTE — Telephone Encounter (Signed)
Patient is calling because she states that the pharmacy sent in a request for zofram on 10/19 and I noticed that we never received it. Patient would like a refill!

## 2015-02-17 NOTE — Telephone Encounter (Signed)
Can we refill? 

## 2015-02-20 NOTE — Telephone Encounter (Signed)
Left message for pt to call back  °

## 2015-02-21 ENCOUNTER — Encounter: Payer: Self-pay | Admitting: Family Medicine

## 2015-02-21 ENCOUNTER — Ambulatory Visit (INDEPENDENT_AMBULATORY_CARE_PROVIDER_SITE_OTHER): Payer: 59 | Admitting: Family Medicine

## 2015-02-21 VITALS — BP 121/79 | HR 66 | Temp 97.9°F | Resp 16 | Ht 64.2 in | Wt 163.4 lb

## 2015-02-21 DIAGNOSIS — E782 Mixed hyperlipidemia: Secondary | ICD-10-CM | POA: Diagnosis not present

## 2015-02-21 DIAGNOSIS — R300 Dysuria: Secondary | ICD-10-CM | POA: Diagnosis not present

## 2015-02-21 DIAGNOSIS — Z0000 Encounter for general adult medical examination without abnormal findings: Secondary | ICD-10-CM

## 2015-02-21 DIAGNOSIS — M791 Myalgia, unspecified site: Secondary | ICD-10-CM

## 2015-02-21 DIAGNOSIS — Z1321 Encounter for screening for nutritional disorder: Secondary | ICD-10-CM | POA: Diagnosis not present

## 2015-02-21 DIAGNOSIS — Z13820 Encounter for screening for osteoporosis: Secondary | ICD-10-CM

## 2015-02-21 DIAGNOSIS — R112 Nausea with vomiting, unspecified: Secondary | ICD-10-CM | POA: Diagnosis not present

## 2015-02-21 DIAGNOSIS — Z1239 Encounter for other screening for malignant neoplasm of breast: Secondary | ICD-10-CM | POA: Diagnosis not present

## 2015-02-21 LAB — POC MICROSCOPIC URINALYSIS (UMFC)

## 2015-02-21 LAB — COMPREHENSIVE METABOLIC PANEL
ALT: 19 U/L (ref 6–29)
AST: 20 U/L (ref 10–35)
Albumin: 4.3 g/dL (ref 3.6–5.1)
Alkaline Phosphatase: 70 U/L (ref 33–130)
BILIRUBIN TOTAL: 0.8 mg/dL (ref 0.2–1.2)
BUN: 15 mg/dL (ref 7–25)
CO2: 27 mmol/L (ref 20–31)
CREATININE: 0.67 mg/dL (ref 0.50–0.99)
Calcium: 9.4 mg/dL (ref 8.6–10.4)
Chloride: 99 mmol/L (ref 98–110)
GLUCOSE: 77 mg/dL (ref 65–99)
Potassium: 3.8 mmol/L (ref 3.5–5.3)
SODIUM: 138 mmol/L (ref 135–146)
Total Protein: 7.2 g/dL (ref 6.1–8.1)

## 2015-02-21 LAB — CBC
HCT: 39.1 % (ref 36.0–46.0)
Hemoglobin: 13.2 g/dL (ref 12.0–15.0)
MCH: 29.1 pg (ref 26.0–34.0)
MCHC: 33.8 g/dL (ref 30.0–36.0)
MCV: 86.3 fL (ref 78.0–100.0)
MPV: 9.9 fL (ref 8.6–12.4)
Platelets: 246 10*3/uL (ref 150–400)
RBC: 4.53 MIL/uL (ref 3.87–5.11)
RDW: 13.7 % (ref 11.5–15.5)
WBC: 4.7 10*3/uL (ref 4.0–10.5)

## 2015-02-21 LAB — TSH: TSH: 1.425 u[IU]/mL (ref 0.350–4.500)

## 2015-02-21 LAB — LIPID PANEL
Cholesterol: 201 mg/dL — ABNORMAL HIGH (ref 125–200)
HDL: 58 mg/dL (ref >=46)
LDL Cholesterol: 123 mg/dL (ref <130)
Total CHOL/HDL Ratio: 3.5 Ratio (ref <=5.0)
Triglycerides: 99 mg/dL (ref <150)
VLDL: 20 mg/dL (ref <30)

## 2015-02-21 LAB — POCT URINALYSIS DIP (MANUAL ENTRY)
BILIRUBIN UA: NEGATIVE
GLUCOSE UA: NEGATIVE
Nitrite, UA: NEGATIVE
Protein Ur, POC: NEGATIVE
SPEC GRAV UA: 1.015
Urobilinogen, UA: 0.2
pH, UA: 5.5

## 2015-02-21 MED ORDER — ONDANSETRON 4 MG PO TBDP: ORAL_TABLET | ORAL | Status: DC

## 2015-02-21 MED ORDER — OMEPRAZOLE 20 MG PO CPDR: 20.0000 mg | DELAYED_RELEASE_CAPSULE | Freq: Every day | ORAL | Status: DC

## 2015-02-21 MED ORDER — GABAPENTIN 100 MG PO CAPS: ORAL_CAPSULE | ORAL | Status: DC

## 2015-02-21 NOTE — Telephone Encounter (Signed)
This was taken care of at her visit today.

## 2015-02-21 NOTE — Patient Instructions (Signed)
Please go to see your dentist Increase your gabapentin to one table in the morning and continue to take two at bedtime.  Either take meloxicam or ibuprofen, not much Keep up the good work with your weight loss!

## 2015-02-21 NOTE — Telephone Encounter (Signed)
Katie Maldonado, can you please address pt's need for zofran today at her OV?

## 2015-02-21 NOTE — Progress Notes (Signed)
Subjective:    Patient ID: Katie Maldonado, female    DOB: [birth year], 64 y.o.   MRN: [id number]  HPI This is a pleasant 64 yo female who presents today for CPE. She works for [employer] in [employer].   Last CPE- 02/11/14 Mammo- 2015 Pap- hysterectomy Colonoscopy- 2007 Tdap- unknown, will have today Flu- annual Eye- annual Dental- not regular Exercise- walks several times a week, belongs to a gym but hasn't been in 3 months.   She has had daily nausea for years and has been taking meloxicam and ibuprofen 400 mg 2-3x/ day for years.   She sees Dr. Zollie Beckers for her MS, she has been changed to Tecfidera from dimthyl  Fumarate. She is not sure if it is helping.   Review of Systems  Constitutional: Positive for fatigue (no change).  HENT: Positive for hearing loss (buzzing, feels like she doesn't hear as well. ).   Eyes: Positive for photophobia (long standing).  Respiratory: Positive for cough (hacking for 1 year, nonproductive) and shortness of breath (going up and down stairs, not with household chores.).   Cardiovascular: Positive for leg swelling (ongoing).  Gastrointestinal: Positive for vomiting and constipation (chronic).       Feels like she is going to throw up daily, takes zofran 2-3x day, vomits several times a week. Eating makes it better. Takes 3-4 tums daily.   Endocrine: Positive for heat intolerance (always hot).  Genitourinary: Positive for frequency (unchanged).  Musculoskeletal: Positive for arthralgias.  Skin: Negative.   Neurological: Positive for weakness and light-headedness.  Hematological: Negative.   Psychiatric/Behavioral: Positive for sleep disturbance. The patient is nervous/anxious.       Objective:   Physical Exam Physical Exam  Constitutional: She is oriented to person, place, and time. She appears well-developed and well-nourished. No distress.  HENT:  Head: Normocephalic and atraumatic.  Right Ear: External ear normal.  Left  Ear: External ear normal.  Nose: Nose normal.  Mouth/Throat: Oropharynx is clear and moist. No oropharyngeal exudate. Poor dentition with dental caries, plaque.  Eyes: Conjunctivae are normal. Pupils are equal, round, and reactive to light.  Neck: Normal range of motion. Neck supple. No JVD present. No thyromegaly present.  Cardiovascular: Normal rate, regular rhythm, normal heart sounds and intact distal pulses.   Pulmonary/Chest: Effort normal and breath sounds normal. Right breast exhibits no inverted nipple, no mass, no nipple discharge, no skin change and no tenderness. Left breast exhibits no inverted nipple, no mass, no nipple discharge, no skin change and no tenderness. Breasts are symmetrical.  Abdominal: Soft. Bowel sounds are normal. She exhibits no distension and no mass. There is no tenderness. There is no rebound and no guarding.  Genitourinary: Vagina normal. Pelvic exam was performed with patient supine. There is no rash, tenderness, lesion or injury on the right labia. There is no rash, tenderness, lesion or injury on the left labia. Cervix surgically absent. No vaginal discharge found.  Musculoskeletal: Normal range of motion. She exhibits no edema or tenderness.  Lymphadenopathy:    She has no cervical adenopathy.  Neurological: She is alert and oriented to person, place, and time. She has normal reflexes.  Skin: Skin is warm and dry. She is not diaphoretic.  Psychiatric: She has a normal mood and affect. Her behavior is normal. Judgment and thought content normal.  Vitals reviewed.  BP 121/79 mmHg  Pulse 66  Temp(Src) 97.9 F (36.6 C) (Oral)  Resp 16  Ht 5' 4.2" (1.631 m)  Wt 163 lb 6.4 oz (74.118 kg)  BMI 27.86 kg/m2 Wt Readings from Last 3 Encounters:  02/21/15 163 lb 6.4 oz (74.118 kg)  12/22/14 171 lb (77.565 kg)  11/28/14 173 lb (78.472 kg)      Assessment & Plan:  1. Annual physical exam - encouraged her to have regular dental care, regular exercise  2.  Mixed hyperlipidemia - Lipid panel - Vit D  25 hydroxy (rtn osteoporosis monitoring)  3. Nausea and vomiting, intractability of vomiting not specified, unspecified vomiting type - I suspect this could be related to overuse of NASIDs, instructed her on maximum dose of either meloxicam or ibuprofen - CBC - Comprehensive metabolic panel - TSH - ondansetron (ZOFRAN-ODT) 4 MG disintegrating tablet; DISSOLVE 1 TABLET ON TONGUE EVERY 6 HOURS AS NEEDED  Dispense: 60 tablet; Refill: 0 - omeprazole (PRILOSEC) 20 MG capsule; Take 1 capsule (20 mg total) by mouth daily.  Dispense: 30 capsule; Refill: 3  4. Screening for breast cancer - MM Digital Screening; Future  5. Screening for osteoporosis - DG Bone Density; Future  6. Dysuria - POCT urinalysis dipstick - POCT Microscopic Urinalysis ([facility])  7. Encounter for vitamin deficiency screening - Vitamin D level  8. Muscle pain - She was on very low dose gabapentin, will work on increasing this to see if we can improve pain control and decrease NSAID use - gabapentin (NEURONTIN) 100 MG capsule; Take one tablet in the morning and two at bedtime  Dispense: 270 capsule; Refill: 1  - follow up 2 months  Clarene Reamer, FNP-BC  [facility] [place] [facility]  02/24/2015 4:35 PM

## 2015-02-22 ENCOUNTER — Other Ambulatory Visit: Payer: Self-pay

## 2015-02-22 ENCOUNTER — Other Ambulatory Visit: Payer: Self-pay | Admitting: Family Medicine

## 2015-02-22 DIAGNOSIS — N3090 Cystitis, unspecified without hematuria: Secondary | ICD-10-CM

## 2015-02-22 DIAGNOSIS — Z1231 Encounter for screening mammogram for malignant neoplasm of breast: Secondary | ICD-10-CM

## 2015-02-22 DIAGNOSIS — E2839 Other primary ovarian failure: Secondary | ICD-10-CM

## 2015-02-22 LAB — VITAMIN D 25 HYDROXY (VIT D DEFICIENCY, FRACTURES): VIT D 25 HYDROXY: 47 ng/mL (ref 30–100)

## 2015-02-22 MED ORDER — CIPROFLOXACIN HCL 250 MG PO TABS: 250.0000 mg | ORAL_TABLET | Freq: Two times a day (BID) | ORAL | Status: DC

## 2015-03-11 ENCOUNTER — Telehealth: Payer: Self-pay | Admitting: Family Medicine

## 2015-03-11 NOTE — Telephone Encounter (Signed)
lmom to call and reschedule her appt that she had with Debbie on 04/11/15

## 2015-03-16 ENCOUNTER — Other Ambulatory Visit: Payer: Self-pay | Admitting: Family Medicine

## 2015-04-05 ENCOUNTER — Ambulatory Visit
Admission: RE | Admit: 2015-04-05 | Discharge: 2015-04-05 | Disposition: A | Discharge status: Home / Self Care | Payer: 59 | Source: Ambulatory Visit | Attending: Family Medicine | Admitting: Family Medicine

## 2015-04-05 ENCOUNTER — Ambulatory Visit
Admission: RE | Admit: 2015-04-05 | Discharge: 2015-04-05 | Disposition: A | Discharge status: Home / Self Care | Payer: 59 | Source: Ambulatory Visit

## 2015-04-05 DIAGNOSIS — Z1231 Encounter for screening mammogram for malignant neoplasm of breast: Secondary | ICD-10-CM

## 2015-04-05 DIAGNOSIS — E2839 Other primary ovarian failure: Secondary | ICD-10-CM

## 2015-04-11 ENCOUNTER — Ambulatory Visit: Payer: 59 | Admitting: Family Medicine

## 2015-04-12 ENCOUNTER — Encounter: Payer: Self-pay | Admitting: Family Medicine

## 2015-04-12 ENCOUNTER — Ambulatory Visit (INDEPENDENT_AMBULATORY_CARE_PROVIDER_SITE_OTHER): Payer: 59 | Admitting: Family Medicine

## 2015-04-12 VITALS — BP 123/80 | HR 70 | Temp 98.7°F | Resp 16 | Ht 64.0 in | Wt 160.0 lb

## 2015-04-12 DIAGNOSIS — M542 Cervicalgia: Secondary | ICD-10-CM | POA: Diagnosis not present

## 2015-04-12 DIAGNOSIS — M810 Age-related osteoporosis without current pathological fracture: Secondary | ICD-10-CM | POA: Diagnosis not present

## 2015-04-12 DIAGNOSIS — M25519 Pain in unspecified shoulder: Secondary | ICD-10-CM

## 2015-04-12 DIAGNOSIS — Z23 Encounter for immunization: Secondary | ICD-10-CM | POA: Diagnosis not present

## 2015-04-12 MED ORDER — PREDNISONE 20 MG PO TABS: ORAL_TABLET | ORAL | Status: DC

## 2015-04-12 MED ORDER — ALENDRONATE SODIUM 70 MG PO TABS: 70.0000 mg | ORAL_TABLET | ORAL | Status: DC

## 2015-04-12 NOTE — Patient Instructions (Addendum)
Do not start Fosamax until the first of the year Do not take andy meloxicam or naprosyn or ibuprofen while on prednisone (can take tylenol)   Cervical Sprain A cervical sprain is an injury in the neck in which the strong, fibrous tissues (ligaments) that connect your neck bones stretch or tear. Cervical sprains can range from mild to severe. Severe cervical sprains can cause the neck vertebrae to be unstable. This can lead to damage of the spinal cord and can result in serious nervous system problems. The amount of time it takes for a cervical sprain to get better depends on the cause and extent of the injury. Most cervical sprains heal in 1 to 3 weeks. CAUSES  Severe cervical sprains may be caused by:   Contact sport injuries (such as from football, rugby, wrestling, hockey, auto racing, gymnastics, diving, martial arts, or boxing).   Motor vehicle collisions.   Whiplash injuries. This is an injury from a sudden forward and backward whipping movement of the head and neck.  Falls.  Mild cervical sprains may be caused by:   Being in an awkward position, such as while cradling a telephone between your ear and shoulder.   Sitting in a chair that does not offer proper support.   Working at a poorly Landscape architect station.   Looking up or down for long periods of time.  SYMPTOMS   Pain, soreness, stiffness, or a burning sensation in the front, back, or sides of the neck. This discomfort may develop immediately after the injury or slowly, 24 hours or more after the injury.   Pain or tenderness directly in the middle of the back of the neck.   Shoulder or upper back pain.   Limited ability to move the neck.   Headache.   Dizziness.   Weakness, numbness, or tingling in the hands or arms.   Muscle spasms.   Difficulty swallowing or chewing.   Tenderness and swelling of the neck.  DIAGNOSIS  Most of the time your health care provider can diagnose a cervical  sprain by taking your history and doing a physical exam. Your health care provider will ask about previous neck injuries and any known neck problems, such as arthritis in the neck. X-rays may be taken to find out if there are any other problems, such as with the bones of the neck. Other tests, such as a CT scan or MRI, may also be needed.  TREATMENT  Treatment depends on the severity of the cervical sprain. Mild sprains can be treated with rest, keeping the neck in place (immobilization), and pain medicines. Severe cervical sprains are immediately immobilized. Further treatment is done to help with pain, muscle spasms, and other symptoms and may include:  Medicines, such as pain relievers, numbing medicines, or muscle relaxants.   Physical therapy. This may involve stretching exercises, strengthening exercises, and posture training. Exercises and improved posture can help stabilize the neck, strengthen muscles, and help stop symptoms from returning.  HOME CARE INSTRUCTIONS   Put ice on the injured area.   Put ice in a plastic bag.   Place a towel between your skin and the bag.   Leave the ice on for 15-20 minutes, 3-4 times a day.   If your injury was severe, you may have been given a cervical collar to wear. A cervical collar is a two-piece collar designed to keep your neck from moving while it heals.  Do not remove the collar unless instructed by your health care  provider.  If you have long hair, keep it outside of the collar.  Ask your health care provider before making any adjustments to your collar. Minor adjustments may be required over time to improve comfort and reduce pressure on your chin or on the back of your head.  Ifyou are allowed to remove the collar for cleaning or bathing, follow your health care provider's instructions on how to do so safely.  Keep your collar clean by wiping it with mild soap and water and drying it completely. If the collar you have been given  includes removable pads, remove them every 1-2 days and hand wash them with soap and water. Allow them to air dry. They should be completely dry before you wear them in the collar.  If you are allowed to remove the collar for cleaning and bathing, wash and dry the skin of your neck. Check your skin for irritation or sores. If you see any, tell your health care provider.  Do not drive while wearing the collar.   Only take over-the-counter or prescription medicines for pain, discomfort, or fever as directed by your health care provider.   Keep all follow-up appointments as directed by your health care provider.   Keep all physical therapy appointments as directed by your health care provider.   Make any needed adjustments to your workstation to promote good posture.   Avoid positions and activities that make your symptoms worse.   Warm up and stretch before being active to help prevent problems.  SEEK MEDICAL CARE IF:   Your pain is not controlled with medicine.   You are unable to decrease your pain medicine over time as planned.   Your activity level is not improving as expected.  SEEK IMMEDIATE MEDICAL CARE IF:   You develop any bleeding.  You develop stomach upset.  You have signs of an allergic reaction to your medicine.   Your symptoms get worse.   You develop new, unexplained symptoms.   You have numbness, tingling, weakness, or paralysis in any part of your body.  MAKE SURE YOU:   Understand these instructions.  Will watch your condition.  Will get help right away if you are not doing well or get worse.   This information is not intended to replace advice given to you by your health care provider. Make sure you discuss any questions you have with your health care provider.   Document Released: 02/10/2007 Document Revised: 04/20/2013 Document Reviewed: 10/21/2012 Elsevier Interactive Patient Education [employer].

## 2015-04-12 NOTE — Progress Notes (Signed)
Subjective:    Patient ID: Katie Maldonado, female    DOB: 09-25-1950, 64 y.o.   MRN: [id number]  HPI This is a pleasant 64 yo female who presents today for follow up of myalgias/neck pain, nausea and to discuss recent bone density study.   Myalgias- she has history of MS and is seen by Dr. Patrecia Pour. At last visit we increased her gabapentin to two at bedtime. She has noticed some improvement in sleep and possibly her pain. She has started to have pain at base of neck and intermittent numbness on left side over the last month. Does not recall any trauma. It comes and goes, she feels like she pinched a nerve. Heat helps. Weakness in arms and legs same. No loss of bowel or bladder function. No falls. She does not like to take muscle relaxers.   Nausea- she thinks it is related to getting hot and having pain. She has continues to take meloxicam and ibuprofen despite our conversation about taking one or the other at her last visit. She is taking 2-4 ondansetron daily. She has noticed constipation. She has not required antacids since starting omeprazole. No heartburn symptoms. She continues to eat a low carb diet and is watching her portion sizes for weight loss.   Bone density/osteoporosis- She had recent femur T-score -3.7. She thinks she took Fosamax in the past when she saw Dr. Redmond Pulling. She does not recall any side effects.    Review of Systems  Constitutional: Negative for fever.  Respiratory: Negative for cough and shortness of breath.   Cardiovascular: Negative for chest pain and leg swelling.  Gastrointestinal: Positive for constipation.  Musculoskeletal: Positive for neck pain.       Objective:   Physical Exam  Constitutional: She is oriented to person, place, and time. She appears well-developed and well-nourished. No distress.  HENT:  Head: Normocephalic and atraumatic.  Eyes: Conjunctivae and EOM are normal. Pupils are equal, round, and reactive to light.  Neck: Neck  supple. Spinous process tenderness and muscular tenderness present. No rigidity. Decreased range of motion (slightly decrased with rotation to the left. ) present. No edema and no erythema present.  Cardiovascular: Normal rate, regular rhythm and normal heart sounds.   Pulmonary/Chest: Effort normal and breath sounds normal.  Neurological: She is alert and oriented to person, place, and time. She displays normal reflexes. No cranial nerve deficit.  Skin: Skin is warm and dry. She is not diaphoretic.  Psychiatric: She has a normal mood and affect. Her behavior is normal. Judgment and thought content normal.  Vitals reviewed.  BP 123/80 mmHg  Pulse 70  Temp(Src) 98.7 F (37.1 C)  Resp 16  Ht 5\' 4"  (1.626 m)  Wt 160 lb (72.576 kg)  BMI 27.45 kg/m2 Wt Readings from Last 3 Encounters:  04/12/15 160 lb (72.576 kg)  02/21/15 163 lb 6.4 oz (74.118 kg)  12/22/14 171 lb (77.565 kg)   Depression screen [facility] 2/9 04/12/2015 02/21/2015 12/22/2014  Decreased Interest 0 0 0  Down, Depressed, Hopeless 0 0 0  PHQ - 2 Score 0 0 0      Assessment & Plan:  1. Neck and shoulder pain - predniSONE (DELTASONE) 20 MG tablet; Take 3 tabs x 3 days, 2 x 3 days, 1 x 3 days.  Dispense: 18 tablet; Refill: 0 - patient instructed not to take any additional NSAID until finished with this - Gentle ROM  TID and heat PRN - RTC if she experiences any worsening of  pain, weakness, falls, loss of bowel/bladder function - If no improvement, will consider additional imaging/referral  2. Osteoporosis - discussed bone density study results - alendronate (FOSAMAX) 70 MG tablet; Take 1 tablet (70 mg total) by mouth once a week. Take with a full glass of water on an empty stomach.  Dispense: 12 tablet; Refill: 3 - Reviewed possible side effects. Patient will start this after she completes prednisone  3. Need for prophylactic vaccination against Streptococcus pneumoniae (pneumococcus) - Pneumococcal conjugate vaccine 13-valent  IM  - follow up in 3 months.  Clarene Reamer, FNP-BC  [facility] [place] [facility]  04/14/2015 7:54 AM

## 2015-05-12 ENCOUNTER — Telehealth: Payer: Self-pay | Admitting: Diagnostic Neuroimaging

## 2015-05-12 ENCOUNTER — Encounter: Payer: Self-pay | Admitting: Diagnostic Neuroimaging

## 2015-05-12 NOTE — Telephone Encounter (Signed)
[facility] called said they tried to fill tecfidera but it has to go thru [employer] and will need PA. Pt has enough thru Monday morning

## 2015-05-12 NOTE — Telephone Encounter (Signed)
Received call back from patient and informed her samples are at front desk for her. Gave her instructions for use, informed her she will have enough for one week.  Also advised her of office hrs. She stated she has enough medication to go through Monday morning, so if she cannot come today, she will come Monday. Gave her number for [employer]Tecfidera pt support/assistance.  She verbalized understanding, appreciation.

## 2015-05-12 NOTE — Telephone Encounter (Signed)
Completed PA form faxed to [place]; Aaron Edelman, [place] pharmacy was notified. Informed him that pt was supplied 1 weeks' samples. He verbalized understanding.

## 2015-05-12 NOTE — Telephone Encounter (Addendum)
Spoke with Katie Maldonado at pharmacy and requested PA form from [facility] be faxed to this office. Gave this caller's name, requested form be faxed as soon as possible. Katie Maldonado verbalized understanding.   Received fax from [facility] for [place], on [facility] desk for completion.  LVM on home phone and mobile #; requested call back before 5 pm today. Left this caller's name, number.

## 2015-05-22 ENCOUNTER — Encounter: Payer: Self-pay | Admitting: *Deleted

## 2015-05-26 ENCOUNTER — Other Ambulatory Visit: Payer: Self-pay | Admitting: Family Medicine

## 2015-05-26 MED FILL — ONDANSETRON ODT 4 MG TABLET: 4 | 15 days supply | Qty: 60 | Fill #0

## 2015-05-26 NOTE — Telephone Encounter (Signed)
Can we refill Zofran?

## 2015-05-31 ENCOUNTER — Ambulatory Visit (INDEPENDENT_AMBULATORY_CARE_PROVIDER_SITE_OTHER): Payer: 59 | Admitting: Diagnostic Neuroimaging

## 2015-05-31 ENCOUNTER — Encounter: Payer: Self-pay | Admitting: Diagnostic Neuroimaging

## 2015-05-31 VITALS — BP 105/72 | HR 70 | Wt 162.6 lb

## 2015-05-31 DIAGNOSIS — G35 Multiple sclerosis: Secondary | ICD-10-CM

## 2015-05-31 MED ORDER — AMANTADINE HCL 100 MG PO CAPS
100.0000 mg | ORAL_CAPSULE | Freq: Every day | ORAL | Status: DC
Start: 2015-05-31 — End: 2015-08-28

## 2015-05-31 MED FILL — AMANTADINE 100 MG TABLET: 100 | 30 days supply | Qty: 60 | Fill #0

## 2015-05-31 NOTE — Patient Instructions (Signed)
Thank you for coming to see [place] at [facility]. I hope we have been able to provide you high quality care today.  You may receive a patient satisfaction survey over the next few weeks. We would appreciate your feedback and comments so that we may continue to improve ourselves and the health of our patients.  - continue tecfidera and gabapentin - try amantadine 100-214m daily - I will check MRI brain   ~~~~~~~~~~~~~~~~~~~~~~~~~~~~~~~~~~~~~~~~~~~~~~~~~~~~~~~~~~~~~~~~~  DR. PENUMALLI'S GUIDE TO HAPPY AND HEALTHY LIVING These are some of my general health and wellness recommendations. Some of them may apply to you better than others. Please use common sense as you try these suggestions and feel free to ask me any questions.   ACTIVITY/FITNESS Mental, social, emotional and physical stimulation are very important for brain and body health. Try learning a new activity (arts, music, language, sports, games).  Keep moving your body to the best of your abilities. You can do this at home, inside or outside, the park, community center, gym or anywhere you like. Consider a physical therapist or personal trainer to get started. Consider the app Sworkit. Fitness trackers such as smart-watches, smart-phones or Fitbits can help as well.   NUTRITION Eat more plants: colorful vegetables, nuts, seeds and berries.  Eat less sugar, salt, preservatives and processed foods.  Avoid toxins such as cigarettes and alcohol.  Drink water when you are thirsty. Warm water with a slice of lemon is an excellent morning drink to start the day.  Consider these websites for more information The Nutrition Source (hhttps://www.henry-hernandez.biz/ Precision Nutrition (wWindowBlog.ch   RELAXATION Consider practicing mindfulness meditation or other relaxation techniques such as deep breathing, prayer, yoga, tai chi, massage. See website mindful.org or the apps  Headspace or Calm to help get started.   SLEEP Try to get at least 7-8+ hours sleep per day. Regular exercise and reduced caffeine will help you sleep better. Practice good sleep hygeine techniques. See website sleep.org for more information.   PLANNING Prepare estate planning, living will, healthcare POA documents. Sometimes this is best planned with the help of an attorney. Theconversationproject.org and agingwithdignity.org are excellent resources.

## 2015-05-31 NOTE — Addendum Note (Signed)
Addended byAndrey Spearman on: 05/31/2015 04:16 PM   Modules accepted: Orders

## 2015-05-31 NOTE — Progress Notes (Signed)
PATIENT: Katie Maldonado DOB: Aug 11, 1950   REASON FOR VISIT: follow up for MS HISTORY FROM: patient  Chief Complaint  Patient presents with  . Multiple sclerosis    rm 6, 08/2014 MRI, Tecfidera, "daytime sleepiness worse, pain in left side-getting worse; Neurontin, Mobic not helpful"  . Follow-up    6 month     HISTORY OF PRESENT ILLNESS:  UPDATE 05/31/15: Since last visit, sxs progressing since Oct 2016. More fatigue, more bladder/bowel issues. More left arm numbness.  UPDATE 11/28/14: Since last visit, continues to have prickly, tingling sensation in whole body, hot flash sensations, pain, neck issues. Had 2 falls, but no injuries.  UPDATE 08/23/14: Since last visit, has been on tecfidera since beginning March 2016. She has noted more general numbness, fatigue, constipation, drawstring sensation in calves/hamstrings in past 3 weeks.   UPDATE 05/18/14: MS symptoms stable. Has had 2 reactions to copaxone, leading to tinging, prickly sensations over whole body. 1 event led to throat tightness, tongue feeling thick, and worried patient. Here to discuss possibilities of switching therapies.  UPDATE 01/04/14 (VRP): Since last visit, doing the same. No new neuro symptoms. Does report persistent fatigue, weight gain, incr appetite, feeling hot. Tolerating copaxone. Misses 2-3 injections per month.   UPDATE 06/30/13 (LL):  Since last visit patient continues to have problems with fatigue and malaise, numbness and paresthesias in left arm which is new for her, started a couple weeks ago.  She states it is constant, does not hurt, but annoying.  She is still able to work.  Follow up MRI brain at last visit showed no acute or new lesions, with moderate cerebral atrophy.  UPDATE 12/30/12 (VP): Since last visit patient continues to have problems with fatigue, insomnia, anxiety, shortness of breath. For past 6 months she's been having dyspnea on exertion especially climbing steps at her work. She has  been evaluated by PCP without specific diagnosis. Patient continues on Copaxone (now 3 times per week dosing). Last MRI brain was in 2011. Regarding fatigue she has been on Provigil and Ritalin in the past without relief. Regarding anxiety she was on Paxil in the past but this caused nausea and stomach problems.   PRIOR HPI (05/02/12, Dr. 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 Her MRI of brain 06/10/1999 showed white matter abnormalities not typical for MS and MRI of the cervical spine without contrast 07/27/99 showed a focal area of abnormality in the spinal cord at C2-3. She had positive CSF IgG index and oligoclonal banding. Initially the VER was normal with subsequent VERs 12/04/1999, 12/10/1999, and 02/01/2000 abnormal in the right eye. She began Avonex which she took for one year and switched to Rebif beginning 08/2000. She was then switched to Betaseron 04/2001 because her insurance would not pay for Rebif, but was subsequently switched back to Rebif 04/2003. Because of elevated liver function tests she was switched to Copaxone 07/03/2005.She has felt better on Copaxone then on the interferons. Her visual acuity varies from 20/30 to 20/40 in the left eye and to 20/200 in the right. She had burning paresthesias treated with gabapentin and at times amitriptyline. NMO blood test was negative 02/21/2005. She has been followed Dr. Estanislado Pandy, rheumatologist for the question of RA versus sarcoidosis and by Dr.Clinton Young for hypersomnolence without a diagnosis of narcolepsy or obstructive Sleep apnea. MRI studies of the brain and cervical spine 02/08/2010 showed periventricular  lesions and unchanged lesion at C2-3. CBC and CMP 03/17/2009 were normal. CBC, CMP, TSH, lipid profile except LDL 151 were normal 08/07/09.vitamin D level 10/16/2009 was 41. She complains of fatigue and her right foot turning in  an occasional foot cramping. She fell 10/2009 fracturing her right foot in a [employer] parking lot. In 2010 she fractured her left foot, left wrist, and her left arm. She has continued swelling in her right foot and lower leg. She has an overactive bladder followed by Dr.Dalhstedt. She has a history of Lhermitte's sign worse with neck extension but also present with flexion. She had numbness on the right side of her body except her face and head for one month.Her bladder symptoms improved on enablex.She walks 1.8 miles 3 times per week. 03/05/2011 she was lying in bed asleep and awoke trying to sit up and developed dizziness on 2 different occasions. She fell back in to the bed and went to sleep. On the third episode she became concerned. The episodes of dizziness would last seconds and are described as spinning. She had dizziness during the day without spinning She did not have head trauma or a cold. She was seen by Dr. Elder Cyphers at urgent care and underwent blood studies, EKG, and urinalysis. She has a history of migraine and a history of motion sickness. She has a Lhermitte's sign when she turns her head to the left with discomfort in her neck going into her left arm. This can occur 2 times per day and lasts Less than 1 minute. She has bladder symptoms and right flank pain and is concerned about another urinary tract infection. She can awaken with snoring when she has a cold. She sleeps well. She has dizziness 2 or 3 times per week when she lies down and turns her head to the left.   REVIEW OF SYSTEMS: Full 14 system review of systems performed and notable only for fatigue constipation heat intolerance freq waking cramps neck pain bladder incont.    ALLERGIES: Allergies  Allergen Reactions  . Nitrofurantoin Shortness Of Breath    Shortness of breath, violent chills  . Sulfonamide Derivatives Shortness Of Breath    Shortness of breath, chills  . Cefdinir     Can't remember what rx had 2 yrs ago    HOME  MEDICATIONS: Outpatient Prescriptions Prior to Visit  Medication Sig Dispense Refill  . alendronate (FOSAMAX) 70 MG tablet Take 1 tablet (70 mg total) by mouth once a week. Take with a full glass of water on an empty stomach. 12 tablet 3  . aspirin EC 81 MG tablet Take 1 tablet (81 mg total) by mouth daily.    . cholecalciferol (VITAMIN D) 1000 UNITS tablet Take 1,000 Units by mouth daily.    . citalopram (CELEXA) 20 MG tablet Take 1 tablet (20 mg total) by mouth daily. PATIENT NEEDS ANXIETY FOLLOW UP FOR ADDITIONAL REFILLS 90 tablet 0  . Dimethyl Fumarate (TECFIDERA) 240 MG CPDR Take 240 mg by mouth 2 (two) times daily.    Marland Kitchen gabapentin (NEURONTIN) 100 MG capsule Take one tablet in the morning and two at bedtime 270 capsule 1  . ibuprofen (ADVIL,MOTRIN) 200 MG tablet Take 2 tablets (400 mg total) by mouth 4 (four) times daily.    . meloxicam (MOBIC) 15 MG tablet Take 1 tablet (15 mg total) by mouth daily. 30 tablet 3  . Omega-3 Fatty Acids (FISH OIL) 1000 MG CAPS Take 1,000 mg by mouth 2 (two) times daily.  0  . omeprazole (PRILOSEC) 20 MG capsule Take 1 capsule (20 mg total) by mouth daily. 30 capsule 3  . ondansetron (ZOFRAN-ODT) 4 MG disintegrating tablet DISSOLVE 1 TABLET ON TONGUE EVERY 6 HOURS AS NEEDED 60 tablet 0  . TURMERIC PO Take 1 tablet by mouth daily.    . meclizine (ANTIVERT) 25 MG tablet TAKE 1 TABLET BY MOUTH 3 TIMES DAILY AS NEEDED. 30 tablet 0  . predniSONE (DELTASONE) 20 MG tablet Take 3 tabs x 3 days, 2 x 3 days, 1 x 3 days. 18 tablet 0  . Red Yeast Rice Extract 600 MG CAPS Take by mouth.     No facility-administered medications prior to visit.   PHYSICAL EXAM  Filed Vitals:   05/31/15 1532  BP: 105/72  Pulse: 70  Weight: 162 lb 9.6 oz (73.755 kg)   Body mass index is 27.9 kg/(m^2).  Physical Exam  General: Patient is awake, alert and in no acute distress. Well developed and groomed.  Neck: Neck is supple.  Cardiovascular: No carotid artery bruits. Heart is  regular rate and rhythm with no murmurs.   Neurologic Exam  Mental Status: Awake, alert. Language is fluent and comprehension intact. Voice soft.  Cranial Nerves: Pupils are equal and reactive to light. Visual fields are full to confrontation. Conjugate eye movements are full and symmetric. SACCADIC DYSMETRIA. Facial sensation and strength are symmetric. Hearing is intact. Palate elevated symmetrically and uvula is midline. Shoulder shrug is symmetric. AT BASELINE LEFT SHOULDER LOWER THAN RIGHT. Tongue is midline.    Motor: Normal bulk and tone. NO TREMOR. NO BRADYKINESIA. Full strength in the upper and lower extremities. No pronator drift.  Sensory: Intact and symmetric to light touch.  Coordination: No ataxia or dysmetria on finger-nose or rapid alternating movement testing.  Reflexes: BUE 2, KNEES 2, ANKLES TRACE Gait and Station:  Normal stance, rising from chair without assistance, smooth turns, able to tandem walk.    DIAGNOSTIC DATA (LABS, IMAGING, TESTING) - I reviewed patient records, labs, notes, testing and imaging myself where available.  Lab Results  Component Value Date   WBC 4.7 02/21/2015   HGB 13.2 02/21/2015   HCT 39.1 02/21/2015   MCV 86.3 02/21/2015   PLT 246 02/21/2015   LYMPHOCYTES ABSOLUTE  Date Value Ref Range Status  08/23/2014 2.3 0.7 - 3.1 x10E3/uL Final  05/18/2014 1.7 0.7 - 3.1 x10E3/uL Final   LYMPHS ABS  Date Value Ref Range Status  09/16/2011 1.8 0.7 - 4.0 K/uL Final  12/22/2010 0.7 0.7 - 4.0 K/uL Final  01/30/2010 1.5 0.7-4.0 K/uL Final   CMP Latest Ref Rng 02/21/2015 08/23/2014 05/18/2014  Glucose 65 - 99 mg/dL 77 84 91  BUN 7 - 25 mg/dL 15 14 16   Creatinine 0.50 - 0.99 mg/dL 0.67 0.84 0.90  Sodium 135 - 146 mmol/L 138 138 140  Potassium 3.5 - 5.3 mmol/L 3.8 5.7(H) 4.1  Chloride 98 - 110 mmol/L 99 98 102  CO2 20 - 31 mmol/L 27 24 27   Calcium 8.6 - 10.4 mg/dL 9.4 10.0 9.7  Total Protein 6.1 - 8.1 g/dL 7.2 7.6 7.0  Total Bilirubin 0.2 - 1.2  mg/dL 0.8 0.4 0.4  Alkaline Phos 33 - 130 U/L 70 77 86  AST 10 - 35 U/L 20 17 18   ALT 6 - 29 U/L 19 17 16    Lab Results  Component Value Date   TSH 1.425 02/21/2015    08/31/14 MRI brain - showing T2/flair hyperintense foci in the periventricular,  deep and subcortical matter in a pattern and configuration consistent with the diagnosis of multiple sclerosis. No acute foci are noted. Generalized cortical atrophy is noted. When compared to a study dated 05/26/2014, there is no interval change.  08/31/14 MRI cervical spine -  1. Severe degenerative changes at C4-C5, C5-C6 and C6-C7 with there is disc protrusion, uncovertebral spurring and facet hypertrophy. There is mild spinal stenosis at each of these 3 levels. There is also moderately severe foraminal narrowing which could lead to impingement of the exiting C5, C6 and C7 nerve roots. 2. Degenerative changes are milder at C2-C3, C3-C4 and C7-T1 and are less likely to lead to nerve root impingement.  3. The spinal cord appears normal.  4. There are no enhancing abnormalities.  5. Compared to the MRI of the cervical spine dated 07/07/2013, there is no definite interval change.    ASSESSMENT AND PLAN  65 y.o. female here with multiple sclerosis since 2001, on copaxone since 2007. Previously on avonex and betaseron.  Then apparent intolerance to copaxone with 2 events of tingling, throat tightness, thick tongue with 2 injections. Now on tecfidera since March 2016. Some progression of symptoms since Oct 2016.   Dx: Multiple sclerosis ([place]) - Plan: MR Brain W Wo Contrast    PLAN: - continue tecfidera  - trial of amantadine for fatigue - check MRI brain   Orders Placed This Encounter  Procedures  . MR Brain W Wo Contrast   Meds ordered this encounter  Medications  . amantadine (SYMMETREL) 100 MG capsule    Sig: Take 1-2 capsules (100-200 mg total) by mouth daily.    Dispense:  60 capsule    Refill:  6   Return in about 3  months (around 08/28/2015).    Penni Bombard, MD [id number], [id number] PM Certified in Neurology, Neurophysiology and Neuroimaging  [facility] [address] [place], [place] [address] [phone]

## 2015-06-13 ENCOUNTER — Other Ambulatory Visit: Payer: Self-pay | Admitting: Family Medicine

## 2015-06-14 ENCOUNTER — Ambulatory Visit (INDEPENDENT_AMBULATORY_CARE_PROVIDER_SITE_OTHER): Payer: 59

## 2015-06-14 DIAGNOSIS — G35 Multiple sclerosis: Secondary | ICD-10-CM

## 2015-06-14 MED FILL — CITALOPRAM HBR 20 MG TABLET: 20 | 30 days supply | Qty: 30 | Fill #0

## 2015-06-15 MED ORDER — GADOPENTETATE DIMEGLUMINE 469.01 MG/ML IV SOLN: 15.0000 mL | Freq: Once | INTRAVENOUS | Status: DC | PRN

## 2015-06-22 ENCOUNTER — Encounter: Payer: Self-pay | Admitting: Diagnostic Neuroimaging

## 2015-06-26 ENCOUNTER — Other Ambulatory Visit: Payer: Self-pay | Admitting: Diagnostic Neuroimaging

## 2015-06-26 DIAGNOSIS — R200 Anesthesia of skin: Secondary | ICD-10-CM

## 2015-06-26 DIAGNOSIS — G35 Multiple sclerosis: Secondary | ICD-10-CM

## 2015-06-26 DIAGNOSIS — M79602 Pain in left arm: Secondary | ICD-10-CM

## 2015-06-28 ENCOUNTER — Ambulatory Visit (INDEPENDENT_AMBULATORY_CARE_PROVIDER_SITE_OTHER): Payer: 59

## 2015-06-28 DIAGNOSIS — R208 Other disturbances of skin sensation: Secondary | ICD-10-CM

## 2015-06-28 DIAGNOSIS — G35 Multiple sclerosis: Secondary | ICD-10-CM | POA: Diagnosis not present

## 2015-06-28 DIAGNOSIS — M79602 Pain in left arm: Secondary | ICD-10-CM | POA: Diagnosis not present

## 2015-06-28 DIAGNOSIS — R200 Anesthesia of skin: Secondary | ICD-10-CM

## 2015-06-28 MED ORDER — GADOPENTETATE DIMEGLUMINE 469.01 MG/ML IV SOLN: 16.0000 mL | Freq: Once | INTRAVENOUS | Status: DC | PRN

## 2015-06-29 ENCOUNTER — Other Ambulatory Visit: Payer: Self-pay | Admitting: *Deleted

## 2015-06-29 ENCOUNTER — Telehealth: Payer: Self-pay | Admitting: *Deleted

## 2015-06-29 NOTE — Telephone Encounter (Signed)
Katie Maldonado: please call patient back. I don't believe a round of steroids would help her neck pain. If she has sudden symptoms that would indicate an MS exacerbation she may have to go to the emergency room. I do not recommend steroids based on her recent test results and her symptomatology. Nevertheless, she may be able to try a slightly higher dose of gabapentin, she can try taking 1 in the morning, 1 at midday and 2 at night. As I understand she is on gabapentin 100 mg strength. She can increase as directed. She can touch base with [place] next week as to how this worked.

## 2015-06-29 NOTE — Telephone Encounter (Signed)
Spoke with patient and informed her that her MRI C spine results overall no significant change compared with previous MRI scan dated 08/31/2014. She stated she "is still in pain." she then stated Dr Leta Baptist sent her a My Chart message on 06/27/15 that stated if she was still having neck pain, he would try " a round of steroids".  She is requesting to be put on steroids.  Confirmed she is taking gabapentin 100 mg, 1 tab in morning and 2 tabs at night. Informed her that Dr Leta Baptist is out of office until 07/04/15 but will route her request to another dr in office and call her back tomorrow. She verbalized understanding, appreciation.

## 2015-06-29 NOTE — Telephone Encounter (Signed)
LVM requesting call back for results. 

## 2015-06-30 NOTE — Telephone Encounter (Signed)
Spoke to patient and relayed Dr Guadelupe Sabin message. She requested earlier FU to discuss her ongoing pain with Dr Leta Baptist; scheduled for 07/07/15. She agreed to follow Dr Tori Milks' recommendation re: gabapentin, going to ED for increase/worsening in symptoms. She verbalized understanding, appreciation for call back.

## 2015-07-04 ENCOUNTER — Other Ambulatory Visit: Payer: Self-pay | Admitting: Family Medicine

## 2015-07-05 MED FILL — OMEPRAZOLE DR 20 MG CAPSULE: 20 | 30 days supply | Qty: 30 | Fill #0

## 2015-07-07 ENCOUNTER — Ambulatory Visit (INDEPENDENT_AMBULATORY_CARE_PROVIDER_SITE_OTHER): Payer: 59 | Admitting: Diagnostic Neuroimaging

## 2015-07-07 ENCOUNTER — Encounter: Payer: Self-pay | Admitting: Diagnostic Neuroimaging

## 2015-07-07 VITALS — BP 104/71 | HR 68 | Ht 64.0 in | Wt 158.8 lb

## 2015-07-07 DIAGNOSIS — R5383 Other fatigue: Secondary | ICD-10-CM

## 2015-07-07 DIAGNOSIS — M791 Myalgia, unspecified site: Secondary | ICD-10-CM

## 2015-07-07 DIAGNOSIS — R208 Other disturbances of skin sensation: Secondary | ICD-10-CM

## 2015-07-07 DIAGNOSIS — G35 Multiple sclerosis: Secondary | ICD-10-CM | POA: Diagnosis not present

## 2015-07-07 DIAGNOSIS — R200 Anesthesia of skin: Secondary | ICD-10-CM

## 2015-07-07 MED ORDER — GABAPENTIN 300 MG PO CAPS: 300.0000 mg | ORAL_CAPSULE | Freq: Two times a day (BID) | ORAL | Status: DC

## 2015-07-07 MED FILL — GABAPENTIN 300 MG CAPSULE: 300 | 30 days supply | Qty: 60 | Fill #0

## 2015-07-07 NOTE — Progress Notes (Signed)
PATIENT: Katie Maldonado DOB: 12-12-1950   REASON FOR VISIT: follow up for MS HISTORY FROM: patient and husband  Chief Complaint  Patient presents with  . Multiple Sclerosis    rm 6, Tecfidera, MRI c spine 06/28/15, MRI brain 06/15/15, husband- JC, "constant left sided numbness/tinging from neck to knee"  . Follow-up    req earlier FU     HISTORY OF PRESENT ILLNESS:  UPDATE 07/07/15: Since last visit, still with left neck pain radiating to the left arm. Numb/tingling. Has h/o left forearm/wrist fracture (2005 and 2010; s/p surgery). Amantadine not helping with fatigue. Denies depression.   UPDATE 05/31/15: Since last visit, sxs progressing since Oct 2016. More fatigue, more bladder/bowel issues. More left arm numbness.  UPDATE 11/28/14: Since last visit, continues to have prickly, tingling sensation in whole body, hot flash sensations, pain, neck issues. Had 2 falls, but no injuries.  UPDATE 08/23/14: Since last visit, has been on tecfidera since beginning March 2016. She has noted more general numbness, fatigue, constipation, drawstring sensation in calves/hamstrings in past 3 weeks.   UPDATE 05/18/14: MS symptoms stable. Has had 2 reactions to copaxone, leading to tinging, prickly sensations over whole body. 1 event led to throat tightness, tongue feeling thick, and worried patient. Here to discuss possibilities of switching therapies.  UPDATE 01/04/14 (VRP): Since last visit, doing the same. No new neuro symptoms. Does report persistent fatigue, weight gain, incr appetite, feeling hot. Tolerating copaxone. Misses 2-3 injections per month.   UPDATE 06/30/13 (LL):  Since last visit patient continues to have problems with fatigue and malaise, numbness and paresthesias in left arm which is new for her, started a couple weeks ago.  She states it is constant, does not hurt, but annoying.  She is still able to work.  Follow up MRI brain at last visit showed no acute or new lesions, with  moderate cerebral atrophy.  UPDATE 12/30/12 (VP): Since last visit patient continues to have problems with fatigue, insomnia, anxiety, shortness of breath. For past 6 months she's been having dyspnea on exertion especially climbing steps at her work. She has been evaluated by PCP without specific diagnosis. Patient continues on Copaxone (now 3 times per week dosing). Last MRI brain was in 2011. Regarding fatigue she has been on Provigil and Ritalin in the past without relief. Regarding anxiety she was on Paxil in the past but this caused nausea and stomach problems.   PRIOR HPI (05/02/12, Dr. 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 Her MRI of brain 06/10/1999 showed white matter abnormalities not typical for MS and MRI of the cervical spine without contrast 07/27/99 showed a focal area of abnormality in the spinal cord at C2-3. She had positive CSF IgG index and oligoclonal banding. Initially the VER was normal with subsequent VERs 12/04/1999, 12/10/1999, and 02/01/2000 abnormal in the right eye. She began Avonex which she took for one year and switched to Rebif beginning 08/2000. She was then switched to Betaseron 04/2001 because her insurance would not pay for Rebif, but was subsequently switched back to Rebif 04/2003. Because of elevated liver function tests she was switched to Copaxone 07/03/2005.She has felt better on Copaxone then on the interferons. Her visual acuity varies from 20/30 to 20/40 in the left eye and to 20/200 in the right. She had burning paresthesias treated with gabapentin and at times amitriptyline. NMO blood test was negative  02/21/2005. She has been followed Dr. Estanislado Pandy, rheumatologist for the question of RA versus sarcoidosis and by Dr.Clinton Young for hypersomnolence without a diagnosis of narcolepsy or obstructive Sleep apnea. MRI studies of the brain and cervical spine  02/08/2010 showed periventricular lesions and unchanged lesion at C2-3. CBC and CMP 03/17/2009 were normal. CBC, CMP, TSH, lipid profile except LDL 151 were normal 08/07/09.vitamin D level 10/16/2009 was 41. She complains of fatigue and her right foot turning in an occasional foot cramping. She fell 10/2009 fracturing her right foot in a [employer] parking lot. In 2010 she fractured her left foot, left wrist, and her left arm. She has continued swelling in her right foot and lower leg. She has an overactive bladder followed by Dr.Dalhstedt. She has a history of Lhermitte's sign worse with neck extension but also present with flexion. She had numbness on the right side of her body except her face and head for one month.Her bladder symptoms improved on enablex.She walks 1.8 miles 3 times per week. 03/05/2011 she was lying in bed asleep and awoke trying to sit up and developed dizziness on 2 different occasions. She fell back in to the bed and went to sleep. On the third episode she became concerned. The episodes of dizziness would last seconds and are described as spinning. She had dizziness during the day without spinning She did not have head trauma or a cold. She was seen by Dr. Elder Cyphers at urgent care and underwent blood studies, EKG, and urinalysis. She has a history of migraine and a history of motion sickness. She has a Lhermitte's sign when she turns her head to the left with discomfort in her neck going into her left arm. This can occur 2 times per day and lasts Less than 1 minute. She has bladder symptoms and right flank pain and is concerned about another urinary tract infection. She can awaken with snoring when she has a cold. She sleeps well. She has dizziness 2 or 3 times per week when she lies down and turns her head to the left.    REVIEW OF SYSTEMS: Full 14 system review of systems performed and notable only for fatigue constipation heat intolerance freq waking cramps neck pain bladder incont.     ALLERGIES: Allergies  Allergen Reactions  . Nitrofurantoin Shortness Of Breath    Shortness of breath, violent chills  . Sulfonamide Derivatives Shortness Of Breath    Shortness of breath, chills  . Cefdinir     Can't remember what rx had 2 yrs ago    HOME MEDICATIONS: Outpatient Prescriptions Prior to Visit  Medication Sig Dispense Refill  . alendronate (FOSAMAX) 70 MG tablet Take 1 tablet (70 mg total) by mouth once a week. Take with a full glass of water on an empty stomach. 12 tablet 3  . amantadine (SYMMETREL) 100 MG capsule Take 1-2 capsules (100-200 mg total) by mouth daily. 60 capsule 6  . aspirin EC 81 MG tablet Take 1 tablet (81 mg total) by mouth daily.    . cholecalciferol (VITAMIN D) 1000 UNITS tablet Take 1,000 Units by mouth daily.    . citalopram (CELEXA) 20 MG tablet TAKE 1 TABLET BY MOUTH DAILY. **NEED FOLLOW UP FOR ADDITIONAL REFILLS** 30 tablet 0  . Dimethyl Fumarate (TECFIDERA) 240 MG CPDR Take 240 mg by mouth 2 (two) times daily.    Marland Kitchen gabapentin (NEURONTIN) 100 MG capsule Take one tablet in the morning and two at bedtime 270 capsule 1  . ibuprofen (ADVIL,MOTRIN) 200  MG tablet Take 2 tablets (400 mg total) by mouth 4 (four) times daily.    . Omega-3 Fatty Acids (FISH OIL) 1000 MG CAPS Take 1,000 mg by mouth 2 (two) times daily.   0  . omeprazole (PRILOSEC) 20 MG capsule TAKE 1 CAPSULE BY MOUTH ONCE DAILY 30 capsule 0  . ondansetron (ZOFRAN-ODT) 4 MG disintegrating tablet DISSOLVE 1 TABLET ON TONGUE EVERY 6 HOURS AS NEEDED 60 tablet 0  . TURMERIC PO Take 1 tablet by mouth daily.    . meloxicam (MOBIC) 15 MG tablet Take 1 tablet (15 mg total) by mouth daily. 30 tablet 3   Facility-Administered Medications Prior to Visit  Medication Dose Route Frequency Provider Last Rate Last Dose  . gadopentetate dimeglumine (MAGNEVIST) injection 15 mL  15 mL Intravenous Once PRN Penni Bombard, MD      . gadopentetate dimeglumine (MAGNEVIST) injection 16 mL  16 mL  Intravenous Once PRN Penni Bombard, MD       PHYSICAL EXAM  Filed Vitals:   07/07/15 0843  BP: 104/71  Pulse: 68  Height: 5\' 4"  (1.626 m)  Weight: 158 lb 12.8 oz (72.031 kg)   Body mass index is 27.24 kg/(m^2).  GENERAL EXAM/CONSTITUTIONAL: Vitals:  Filed Vitals:   07/07/15 0843  BP: 104/71  Pulse: 68  Height: 5\' 4"  (1.626 m)  Weight: 158 lb 12.8 oz (72.031 kg)     Patient is in no distress; well developed, nourished and groomed; neck is supple  CARDIOVASCULAR:  Examination of carotid arteries is normal; no carotid bruits  Regular rate and rhythm, no murmurs  Examination of peripheral vascular system by observation and palpation is normal  EYES:  Ophthalmoscopic exam of optic discs and posterior segments is normal; no papilledema or hemorrhages  MUSCULOSKELETAL:  Gait, strength, tone, movements noted in Neurologic exam below  NEUROLOGIC: MENTAL STATUS:   awake, alert, oriented to person, place and time  recent and remote memory intact  normal attention and concentration  language fluent, comprehension intact, naming intact,   fund of knowledge appropriate  CRANIAL NERVE:   2nd - no papilledema on fundoscopic exam  2nd, 3rd, 4th, 6th - pupils equal and reactive to light, visual fields full to confrontation, extraocular muscles intact, no nystagmus  5th - facial sensation symmetric  7th - facial strength symmetric  8th - hearing intact  9th - palate elevates symmetrically, uvula midline  11th - shoulder shrug symmetric  12th - tongue protrusion midline  MOTOR:   normal bulk and tone, full strength in the BUE, BLE   AT BASELINE LEFT SHOULDER LOWER THAN RIGHT  SENSORY:   normal and symmetric to light touch, temperature, vibration  COORDINATION:   finger-nose-finger, fine finger movements normal  REFLEXES:   deep tendon reflexes present and symmetric  GAIT/STATION:   narrow based gait; SLIGHT UNSTEADY TURNING; romberg is  negative    DIAGNOSTIC DATA (LABS, IMAGING, TESTING) - I reviewed patient records, labs, notes, testing and imaging myself where available.  Lab Results  Component Value Date   WBC 4.7 02/21/2015   HGB 13.2 02/21/2015   HCT 39.1 02/21/2015   MCV 86.3 02/21/2015   PLT 246 02/21/2015   LYMPHOCYTES ABSOLUTE  Date Value Ref Range Status  08/23/2014 2.3 0.7 - 3.1 x10E3/uL Final  05/18/2014 1.7 0.7 - 3.1 x10E3/uL Final   LYMPHS ABS  Date Value Ref Range Status  09/16/2011 1.8 0.7 - 4.0 K/uL Final  12/22/2010 0.7 0.7 - 4.0 K/uL Final  01/30/2010  1.5 0.7-4.0 K/uL Final   CMP Latest Ref Rng 02/21/2015 08/23/2014 05/18/2014  Glucose 65 - 99 mg/dL 77 84 91  BUN 7 - 25 mg/dL 15 14 16   Creatinine 0.50 - 0.99 mg/dL 0.67 0.84 0.90  Sodium 135 - 146 mmol/L 138 138 140  Potassium 3.5 - 5.3 mmol/L 3.8 5.7(H) 4.1  Chloride 98 - 110 mmol/L 99 98 102  CO2 20 - 31 mmol/L 27 24 27   Calcium 8.6 - 10.4 mg/dL 9.4 10.0 9.7  Total Protein 6.1 - 8.1 g/dL 7.2 7.6 7.0  Total Bilirubin 0.2 - 1.2 mg/dL 0.8 0.4 0.4  Alkaline Phos 33 - 130 U/L 70 77 86  AST 10 - 35 U/L 20 17 18   ALT 6 - 29 U/L 19 17 16    Lab Results  Component Value Date   TSH 1.425 02/21/2015    08/31/14 MRI brain - showing T2/flair hyperintense foci in the periventricular, deep and subcortical matter in a pattern and configuration consistent with the diagnosis of multiple sclerosis. No acute foci are noted. Generalized cortical atrophy is noted. When compared to a study dated 05/26/2014, there is no interval change.  08/31/14 MRI cervical spine -  1. Severe degenerative changes at C4-C5, C5-C6 and C6-C7 with there is disc protrusion, uncovertebral spurring and facet hypertrophy. There is mild spinal stenosis at each of these 3 levels. There is also moderately severe foraminal narrowing which could lead to impingement of the exiting C5, C6 and C7 nerve roots. 2. Degenerative changes are milder at C2-C3, C3-C4 and C7-T1 and are less  likely to lead to nerve root impingement.  3. The spinal cord appears normal.  4. There are no enhancing abnormalities.  5. Compared to the MRI of the cervical spine dated 07/07/2013, there is no definite interval change.   06/14/15 MRI brain  - 1. Mild periventricular and subcortical and infratentorial chronic demyelinating plaques.  2. No abnormal lesions are seen on post contrast views.  3. No change from MRI on 08/31/14.   06/28/15 MRI cervical  - severe degenerative changes at C4-5, C5-6 and C6-7 resulting in broad-based disc protrusions, uncovertebral spurring and facet hypertrophy resulting in mild canal and moderate bilateral foraminal narrowing with likely encroachment on the exiting C5, C6 and C7 nerve roots. No enhancing or demyelinating lesions are noted. Overall no significant change compared with previous MRI scan dated 08/31/2014.   ASSESSMENT AND PLAN  65 y.o. female here with multiple sclerosis since 2001, on copaxone since 2007. Previously on avonex and betaseron.  Then apparent intolerance to copaxone with 2 events of tingling, throat tightness, thick tongue with 2 injections. Now on tecfidera since March 2016. Some progression of symptoms since Oct 2016, but MRI scans are stable.  Left neck and arm symptoms likely related to cervical radiculopathies. Will try conservative treatment options.   Dx:  Multiple sclerosis ([place]) - Plan: Ambulatory referral to Physical Therapy, Ambulatory referral to Occupational Therapy  Muscle pain - Plan: gabapentin (NEURONTIN) 300 MG capsule, Ambulatory referral to Physical Therapy, Ambulatory referral to Occupational Therapy  Other fatigue  Left arm numbness    PLAN: - continue tecfidera  - I will setup physical therapy and occupational therapy - increase gabapentin to 300mg  twice a day; after 1 month may increase to 3 times per day - try yoga classes or other stretching / exercising techniques - reduce sugar / carbohydrate  intake - if left neck / arm pain continues for 1 more month, then we will setup pain  mgmt referral for steroid injection - continue amantadine for 1 more month; if not helping then stop  Orders Placed This Encounter  Procedures  . Ambulatory referral to Physical Therapy  . Ambulatory referral to Occupational Therapy   Meds ordered this encounter  Medications  . gabapentin (NEURONTIN) 300 MG capsule    Sig: Take 1 capsule (300 mg total) by mouth 2 (two) times daily.    Dispense:  60 capsule    Refill:  12   Return in about 3 months (around 10/07/2015).    Penni Bombard, MD [id number], 0000000 AM Certified in Neurology, Neurophysiology and Neuroimaging  [facility] [address], [place],  [address] [phone]

## 2015-07-07 NOTE — Patient Instructions (Addendum)
Thank you for coming to see [place] at [facility]. I hope we have been able to provide you high quality care today.  You may receive a patient satisfaction survey over the next few weeks. We would appreciate your feedback and comments so that we may continue to improve ourselves and the health of our patients.  - I will setup physical therapy and occupational therapy - increase gabapentin to 379m twice a day; after 1 month may increase to 3 times per day - try yoga classes or other stretching / exercising techniques - reduce sugar / carbohydrate intake - if left neck / arm pain continues for 1 more month, then we will setup pain mgmt referral for steroid injection  - continue amantadine for 1 more month; if not helping then stop   ~~~~~~~~~~~~~~~~~~~~~~~~~~~~~~~~~~~~~~~~~~~~~~~~~~~~~~~~~~~~~~~~~  DR. Dayvian Blixt'S GUIDE TO HAPPY AND HEALTHY LIVING These are some of my general health and wellness recommendations. Some of them may apply to you better than others. Please use common sense as you try these suggestions and feel free to ask me any questions.   ACTIVITY/FITNESS Mental, social, emotional and physical stimulation are very important for brain and body health. Try learning a new activity (arts, music, language, sports, games).  Keep moving your body to the best of your abilities. You can do this at home, inside or outside, the park, community center, gym or anywhere you like. Consider a physical therapist or personal trainer to get started. Consider the app Sworkit. Fitness trackers such as smart-watches, smart-phones or Fitbits can help as well.   NUTRITION Eat more plants: colorful vegetables, nuts, seeds and berries.  Eat less sugar, salt, preservatives and processed foods.  Avoid toxins such as cigarettes and alcohol.  Drink water when you are thirsty. Warm water with a slice of lemon is an excellent morning drink to start the day.  Consider these websites for  more information The Nutrition Source (hhttps://www.henry-hernandez.biz/ Precision Nutrition (wWindowBlog.ch   RELAXATION Consider practicing mindfulness meditation or other relaxation techniques such as deep breathing, prayer, yoga, tai chi, massage. See website mindful.org or the apps Headspace or Calm to help get started.   SLEEP Try to get at least 7-8+ hours sleep per day. Regular exercise and reduced caffeine will help you sleep better. Practice good sleep hygeine techniques. See website sleep.org for more information.   PLANNING Prepare estate planning, living will, healthcare POA documents. Sometimes this is best planned with the help of an attorney. Theconversationproject.org and agingwithdignity.org are excellent resources.

## 2015-07-12 ENCOUNTER — Telehealth: Payer: Self-pay

## 2015-07-12 NOTE — Telephone Encounter (Signed)
I called patient and left my name both times, so she could call me back to schedule an appointment with Jackelyn Poling in April.   I have left her another message with my name, since she was on the wait list.

## 2015-07-12 NOTE — Telephone Encounter (Signed)
Pt called to say she received a call stating [name] have something on the 4th or the 11th of April for an appt and she wanted to make one, I didn't see where she have Anything doing those dates,pt wasn't happy and I'm not sure who called her. Please call pt at [phone]

## 2015-07-17 ENCOUNTER — Encounter: Payer: Self-pay | Admitting: Occupational Therapy

## 2015-07-17 ENCOUNTER — Ambulatory Visit: Payer: 59 | Admitting: Occupational Therapy

## 2015-07-17 ENCOUNTER — Ambulatory Visit: Payer: 59 | Attending: Diagnostic Neuroimaging | Admitting: Physical Therapy

## 2015-07-17 DIAGNOSIS — R200 Anesthesia of skin: Secondary | ICD-10-CM | POA: Diagnosis not present

## 2015-07-17 DIAGNOSIS — M5090 Cervical disc disorder, unspecified, unspecified cervical region: Secondary | ICD-10-CM | POA: Insufficient documentation

## 2015-07-17 DIAGNOSIS — M542 Cervicalgia: Secondary | ICD-10-CM | POA: Insufficient documentation

## 2015-07-17 DIAGNOSIS — M5010 Cervical disc disorder with radiculopathy, unspecified cervical region: Secondary | ICD-10-CM | POA: Insufficient documentation

## 2015-07-17 DIAGNOSIS — R202 Paresthesia of skin: Secondary | ICD-10-CM

## 2015-07-17 NOTE — Therapy (Signed)
[facility] [address] [place], [place], [address] Phone: [phone]   Fax:  [phone]  Physical Therapy Evaluation  Patient Details  Name: Katie Maldonado MRN: [id number] Date of Birth: Jan 26, 1951 Referring Provider: Dr. Andrey Spearman  Encounter Date: 07/17/2015      PT End of Session - 07/17/15 0951    Visit Number 1   Number of Visits 9   Date for PT Re-Evaluation 08/17/15   Authorization Type [facility] UMR   PT Start Time 0850   PT Stop Time 0928   PT Time Calculation (min) 38 min      Past Medical History  Diagnosis Date  . Multiple sclerosis ([place])   . Other and unspecified hyperlipidemia   . Obesity, unspecified   . Allergy   . Arthritis   . Neuromuscular disorder ([place])   . Osteoporosis   . TIA (transient ischemic attack) 20  . Migraine   . Hypersomnolence   . Anxiety     Past Surgical History  Procedure Laterality Date  . Hernia repair    . Tubal ligation    . Wrist surgery    . Arm surgery left    . Abdominal hysterectomy      Fibroids    There were no vitals filed for this visit.  Visit Diagnosis:  Cervical neck pain with evidence of disc disease - Plan: PT plan of care cert/re-cert  Numbness and tingling in left arm - Plan: PT plan of care cert/re-cert  Cervical disc disorder with radiculopathy of cervical region - Plan: PT plan of care cert/re-cert      Subjective Assessment - 07/17/15 1009    Subjective Pt reports numbness and tingling in L side - "goes from my neck to my knee"; states the tingling and numbness started in Oct. 2016   Pertinent History MS- diagnosed in 2001; L wrist fracture 2005 and then again in 2010 - with plate and 6 screws due to open fracture; cervical disc disease: OA   Patient Stated Goals get rid of numbness in L side of body and improve posture -"my left shoulder is lower"            [facility] PT Assessment - 07/17/15 0943    Assessment   Medical Diagnosis Multiple Sclerosis: Cervical radiculopathy   Referring Provider Dr. Andrey Spearman   Onset Date/Surgical Date --  Oct. 2016   Precautions   Precautions None   Restrictions   Weight Bearing Restrictions No   Balance Screen   Has the patient fallen in the past 6 months Yes   How many times? 1   Has the patient had a decrease in activity level because of a fear of falling?  No   Is the patient reluctant to leave their home because of a fear of falling?  No   Home Environment   Living Environment Private residence   Type of [place] Access Level entry   Home Layout One level   Prior Function   Level of Independence Independent   Vocation Full time employment   Vocation Requirements Works for [employer] in [place] / Strength   AROM / PROM / Strength Strength   AROM   Overall AROM  Within functional limits for tasks performed   Overall AROM Comments Bil. UE's   Cervical Flexion 48   Cervical Extension 32  increased numbness and tingling with extension   Cervical - Right Side  Bend 28   Cervical - Left Side Bend 40   Cervical - Right Rotation 34   Cervical - Left Rotation 46   Strength   Overall Strength Within functional limits for tasks performed   Right Shoulder Flexion 5/5   Right Shoulder ABduction 5/5   Left Shoulder Flexion 4-/5   Left Shoulder ABduction 4-/5   Ambulation/Gait   Ambulation/Gait Yes   Ambulation/Gait Assistance 7: Independent   Ambulation Distance (Feet) 100 Feet   Assistive device None   Gait Pattern Within Functional Limits                                PT Long Term Goals - 07/17/15 1206    PT LONG TERM GOAL #1   Title Pt will report at least 25% improvement in LUE numbness and tingling.  (08-17-15)   Time 4   Period Weeks   Status New   PT LONG TERM GOAL #2   Title Increase R cervical rotation to at least 45 degrees and R lateral flexion to at least 40 degrees to increase ease  with head movements during environmental scanning and driving.  ([id number])   Time 4   Period Weeks   Status New   PT LONG TERM GOAL #3   Title Independent in HEP for cervical ROM and LUE strengthening.  (08-17-15)   Time 4   Period Weeks   Status New               Plan - 07/17/15 0953    Clinical Impression Statement Pt is a 65 year old lady with history of MS since 2001 and c/o numbness and tingling in LUE, radiating down to L knee, constant in occurrence but varying in intensity. MD note states severe degenerative changes at C4-5, C5-6, and C6-7 with disc protrusion, mild spinal stenosis at these 3 levels; numbness and tingling attributed to cervical radiculopathies per Dr. Gladstone Lighter note. Co-morbidities affecting POC include MS, OA, h/o 2 L wrist fractures, osteoporosis, and migraine.                                                                                              Pt will benefit from skilled therapeutic intervention in order to improve on the following deficits Postural dysfunction;Pain;Decreased range of motion;Decreased strength   Rehab Potential Good   PT Frequency 2x / week   PT Duration 4 weeks   PT Treatment/Interventions ADLs/Self Care Home Management;Moist Heat;Therapeutic exercise;Therapeutic activities;Functional mobility training;Traction;Ultrasound;Neuromuscular re-education;Patient/family education;Passive range of motion   PT Next Visit Plan cervical traction to decrease LUE numbness and tingling; begin HEP for cervical stretching and ROM   PT Home Exercise Plan see above   Consulted and Agree with Plan of Care Patient         Problem List Patient Active Problem List   Diagnosis Date Noted  . Degenerative cervical disc 10/04/2014  . [place] arthritis 08/19/2014  . Ankle sprain 04/14/2014  . Low back pain 11/26/2013  . Numbness and tingling in left arm 07/09/2013  . Shoulder bursitis  06/18/2013  . Anxiety state, unspecified 12/30/2012  . Other  malaise and fatigue 12/30/2012  . Osteoporosis, unspecified 11/13/2012  . Osteoarthritis of multiple joints 04/18/2011  . Incontinence of urine 04/18/2011  . HYPERLIPIDEMIA-MIXED 01/08/2010  . MULTIPLE SCLEROSIS 01/08/2010  . DYSPNEA 01/05/2010    Alda Lea, PT 07/17/2015, 12:17 PM  [facility] [address] [place], [place], [address] Phone: [phone]   Fax:  [phone]  Name: Katie Maldonado MRN: [id number] Date of Birth: 1951/02/26

## 2015-07-17 NOTE — Therapy (Signed)
[facility] [address] [place], [place], [address] Phone: [phone]   Fax:  [phone]  Occupational Therapy Evaluation  Patient Details  Name: Katie Maldonado MRN: [id number] Date of Birth: [birth year] Referring Provider: Dr. Andrey Spearman  Encounter Date: 07/17/2015      OT End of Session - 07/17/15 1001    Visit Number 1   Number of Visits 1   Authorization Type UMR   OT Start Time 0930   OT Stop Time 0950   OT Time Calculation (min) 20 min   Activity Tolerance Patient tolerated treatment well      Past Medical History  Diagnosis Date  . Multiple sclerosis ([place])   . Other and unspecified hyperlipidemia   . Obesity, unspecified   . Allergy   . Arthritis   . Neuromuscular disorder ([place])   . Osteoporosis   . TIA (transient ischemic attack) 20  . Migraine   . Hypersomnolence   . Anxiety     Past Surgical History  Procedure Laterality Date  . Hernia repair    . Tubal ligation    . Wrist surgery    . Arm surgery left    . Abdominal hysterectomy      Fibroids    There were no vitals filed for this visit.  Visit Diagnosis:  Neck pain      Subjective Assessment - 07/17/15 0936    Currently in Pain? Yes   Pain Score 10-Worst pain ever   Pain Location --  neck to knee including L arm (L side of body   Pain Descriptors / Indicators Tingling;Numbness;Nagging   Pain Onset More than a month ago   Pain Frequency Constant   Aggravating Factors  moving head, neck   Pain Relieving Factors heat helps, looking down helps - reports she is sleeping with heating pad.  MD just increased neurotin. States ibuprofen does not really help.    Multiple Pain Sites No           [facility] OT Assessment - 07/17/15 0938    Assessment   Diagnosis MS, severe degenerative changes C4-C7.   Referring Provider Dr. Andrey Spearman   Onset Date 08/31/14   Prior Therapy PT in past   Precautions   Precautions  Fall   Restrictions   Weight Bearing Restrictions No   Balance Screen   Has the patient fallen in the past 6 months Yes  Pt with PT eval today   How many times? 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    IADL   Shopping Takes care of all shopping needs  independently   [place] alone or with occasional assistance   Meal Prep Plans, prepares and serves adequate meals independently   [place] own vehicle   Medication Management Is responsible for taking medication in correct dosages at correct time   Physiological scientist financial matters independently (budgets, writes checks, pays rent, bills goes to bank), collects and keeps track of income   Mobility   Mobility Status Independent   Written Expression   Dominant Hand Right   Vision - History   Baseline Vision Wears glasses all the time   Vision Assessment   Comment States she needs her annual eye appt but not true visual changes   Activity Tolerance   Activity Tolerance Endurance does not limit participation in activity   Cognition   Overall  Cognitive Status Within Functional Limits for tasks assessed   Sensation   Light Touch Appears Intact   Hot/Cold Appears Intact   Proprioception Appears Intact   Coordination   Gross Motor Movements are Fluid and Coordinated Yes   Finger Nose Finger Test WNL'S   Other Pt has no [place] deficts per testing.   Edema   Edema Pt reports mild swelling at times due to arthritis   Tone   Assessment Location Left Upper Extremity   ROM / Strength   AROM / PROM / Strength AROM;Strength   AROM   Overall AROM  Within functional limits for tasks performed   Overall AROM Comments For BUE's   Strength   Overall Strength Within functional limits for tasks performed   Overall Strength Comments For BUE's   Hand Function   Right Hand Gross Grasp Functional   Right Hand Grip (lbs) 40   Left Hand Gross Grasp Functional   Left Hand Grip (lbs) 35   LUE Tone   LUE Tone Within Functional Limits                           OT Short Term Goals - 07/17/15 0958    OT SHORT TERM GOAL #1   Title n/a           OT Long Term Goals - 07/17/15 0958    OT LONG TERM GOAL #1   Title n/a               Plan - 07/17/15 0958    Clinical Impression Statement Pt is a 65 year old female with MS who is also with C4-C7 severe degenerative changes with resultant neck pain, that radiates down L arm and L leg. Pt today presents wtih only these symptoms and no focal weakness, no changes in  coordinaton in LUE and is totally independent in all ADL's and IADL's. Pt is also working full time.  DO not feel pt at this time demonstrates a need for skilled OT. Pt in agreement.    Pt will benefit from skilled therapeutic intervention in order to improve on the following deficits (Retired) --  n/a   Rehab Potential --  n/a   OT Frequency --  n/a   Plan no further skilled OT indicated.    Consulted and Agree with Plan of Care Patient        Problem List Patient Active Problem List   Diagnosis  Date Noted  . Degenerative cervical disc 10/04/2014  . [place] arthritis 08/19/2014  . Ankle sprain 04/14/2014  . Low back pain 11/26/2013  . Numbness and tingling in left arm  07/09/2013  . Shoulder bursitis 06/18/2013  . Anxiety state, unspecified 12/30/2012  . Other malaise and fatigue 12/30/2012  . Osteoporosis, unspecified 11/13/2012  . Osteoarthritis of multiple joints 04/18/2011  . Incontinence of urine 04/18/2011  . HYPERLIPIDEMIA-MIXED 01/08/2010  . MULTIPLE SCLEROSIS 01/08/2010  . DYSPNEA 01/05/2010    Quay Burow, OTR/L 07/17/2015, 10:02 AM  [facility] [address] [place], [place], [address] Phone: [phone]   Fax:  [phone]  Name: ARISTEA HEFEL MRN: [id number] Date of Birth: Aug 14, 1950

## 2015-07-18 ENCOUNTER — Other Ambulatory Visit: Payer: Self-pay | Admitting: Family Medicine

## 2015-07-18 MED FILL — AMANTADINE 100 MG TABLET: 100 | 30 days supply | Qty: 60 | Fill #1

## 2015-07-19 ENCOUNTER — Ambulatory Visit: Payer: 59 | Admitting: Physical Therapy

## 2015-07-19 DIAGNOSIS — M5010 Cervical disc disorder with radiculopathy, unspecified cervical region: Secondary | ICD-10-CM

## 2015-07-19 DIAGNOSIS — R200 Anesthesia of skin: Secondary | ICD-10-CM

## 2015-07-19 DIAGNOSIS — M542 Cervicalgia: Secondary | ICD-10-CM

## 2015-07-19 DIAGNOSIS — M5090 Cervical disc disorder, unspecified, unspecified cervical region: Secondary | ICD-10-CM

## 2015-07-19 DIAGNOSIS — R202 Paresthesia of skin: Secondary | ICD-10-CM

## 2015-07-19 MED FILL — CITALOPRAM HBR 20 MG TABLET: 20 | 30 days supply | Qty: 30 | Fill #0

## 2015-07-19 NOTE — Patient Instructions (Signed)
Axial Extension (Chin Tuck)    Pull chin in and lengthen back of neck. Hold __5__ seconds while counting out loud. Repeat __10__ times. Do __2-3__ sessions per day.  http://gt2.exer.us/450   Copyright  VHI. All rights reserved.     FOR ALL THE FOLLOWING EX, MAINTAIN CHIN TUCK AND SHOULDER BLADES DOWN AND BACK INTO SUPPORTING SURFACE.   Over Head Pull: Narrow Grip       On back, knees bent, feet flat, band across thighs, elbows straight but relaxed. Pull hands apart (start). Keeping elbows straight, bring arms up and over head, hands toward floor. Keep pull steady on band. Hold momentarily. Return slowly, keeping pull steady, back to start. Repeat __10_ times. Band color _Y_____   Side Pull: Double Arm   On back, knees bent, feet flat. Arms perpendicular to body, shoulder level, elbows straight but relaxed. Pull arms out to sides, elbows straight. Resistance band comes across collarbones, hands toward floor. Hold momentarily. Slowly return to starting position. Repeat __10_ times. Band color __Y___   Shoulder Rotation: Double Arm   On back, knees bent, feet flat, elbows tucked at sides, bent 90, hands palms up. Pull hands apart and down toward floor, keeping elbows near sides. Hold momentarily. Slowly return to starting position. Repeat __10_ times. Band color ___Y___

## 2015-07-19 NOTE — Therapy (Signed)
[place], [place], [address] Phone: [phone]   Fax:  [phone]  Physical Therapy Treatment  Patient Details  Name: Katie Maldonado MRN: [id number] Date of Birth: 09-Jan-1951 Referring Provider: Dr. Andrey Spearman  Encounter Date: 07/19/2015      PT End of Session - 07/19/15 0854    Visit Number 2   Number of Visits 9   Date for PT Re-Evaluation 08/17/15   PT Start Time 0847   PT Stop Time 0945   PT Time Calculation (min) 58 min   Activity Tolerance Patient tolerated treatment well   Behavior During Therapy [facility] for tasks assessed/performed      Past Medical History  Diagnosis Date  . Multiple sclerosis ([place])   . Other and unspecified hyperlipidemia   . Obesity, unspecified   . Allergy   . Arthritis   . Neuromuscular disorder ([place])   . Osteoporosis   . TIA (transient ischemic attack) 20  . Migraine   . Hypersomnolence   . Anxiety     Past Surgical History  Procedure Laterality Date  . Hernia repair    . Tubal ligation    . Wrist surgery    . Arm surgery left    . Abdominal hysterectomy      Fibroids    There were no vitals filed for this visit.  Visit Diagnosis:  Neck pain  Cervical neck pain with evidence of disc disease  Numbness and tingling in left arm  Cervical disc disorder with radiculopathy of cervical region      Subjective Assessment - 07/19/15 0849    Subjective Its not really hurting but its a pins and needles feeling neck to my knee.  I worry it is paralyzing me.  Head feels better down than it does up.    Currently in Pain? Yes   Pain Score 4    Pain Location Neck   Pain Orientation Left   Pain Type Chronic pain   Pain Onset More than a month ago   Pain Frequency Constant   Aggravating Factors  holding head neutral or ext, turning head    Multiple Pain Sites Yes   Pain Score 2   Pain Location Back   Pain Orientation Left   Pain Descriptors /  Indicators Pins and needles;Aching   Pain Radiating Towards L LE to knee    Pain Onset More than a month ago   Pain Frequency Constant  tingling in constant    Aggravating Factors  sitting too long   Pain Relieving Factors heat                          [facility] Adult PT Treatment/Exercise - 07/19/15 0907    Neck Exercises: Supine   Neck Retraction 10 reps;5 secs   Neck Retraction Limitations nod into soft ball    Cervical Rotation 20 reps  and small ROM flexion/ext    Other Supine Exercise scapular retraction and combo for stabilization    Shoulder Exercises: Supine   Horizontal ABduction Strengthening;Both;10 reps;Theraband   Theraband Level (Shoulder Horizontal ABduction) Level 1 (Yellow)   External Rotation Strengthening;Both;10 reps;Theraband   Theraband Level (Shoulder External Rotation) Level 1 (Yellow)   Other Supine Exercises supine scap stab: overhead (yellow band)   Cryotherapy   Number Minutes Cryotherapy 15 Minutes   Cryotherapy Location Shoulder   Type of Cryotherapy Ice pack   Electrical Stimulation   Electrical Stimulation  Location post neck L   Electrical Stimulation Action IFC   Electrical Stimulation Parameters to tol   Electrical Stimulation Goals Pain   Manual Therapy   Soft tissue mobilization suboccipitals and upper traps   Passive ROM rotation    Manual Traction gentle 30 sec x 5 decr L Ue pain and tingling                 PT Education - 07/19/15 1235    Education provided Yes   Education Details PT/POC, HEP and stabilization   Person(s) Educated Patient   Methods Explanation;Demonstration;Handout   Comprehension Verbalized understanding;Returned demonstration             PT Long Term Goals - 07/19/15 1236    PT LONG TERM GOAL #1   Title Pt will report at least 25% improvement in LUE numbness and tingling.  (08-17-15)   Status New   PT LONG TERM GOAL #2   Title Increase R cervical rotation to at least 45 degrees and  R lateral flexion to at least 40 degrees to increase ease with head movements during environmental scanning and driving.  ([id number])   Status New   PT LONG TERM GOAL #3   Title Independent in HEP for cervical ROM and LUE strengthening.  (08-17-15)   Status New   PT LONG TERM GOAL #4   Title Pt will be able to do UE/LE and core strengthening at the gym and report no increase in pain    Time 4   Period Weeks   Status New   PT LONG TERM GOAL #5   Title Pt will be I with concepts of osteoporosis and posture to prevent reinjury   Time 4   Period Weeks   Status New               Plan - 07/19/15 0915    Clinical Impression Statement Pt with osteoporosis, will not do traction if in her spine (will follow up).  Pt had continuous numbness and tingling in LUE with some reduction in symptoms with manual traction.  Able to give initial HEP today and trial of IFC.  Made changes to POC due to being a transfer from Neuro.    Pt will benefit from skilled therapeutic intervention in order to improve on the following deficits Postural dysfunction;Pain;Decreased range of motion;Decreased strength;Impaired sensation;Impaired flexibility;Improper body mechanics   Rehab Potential Good   PT Frequency 2x / week   PT Duration 4 weeks   PT Treatment/Interventions ADLs/Self Care Home Management;Moist Heat;Therapeutic exercise;Therapeutic activities;Functional mobility training;Traction;Ultrasound;Neuromuscular re-education;Patient/family education;Passive range of motion;Electrical Stimulation;Cryotherapy;Dry needling   PT Next Visit Plan cervical/scapular stabilization, manual and modalities for pain.  Would like to try TrP DN.    PT Home Exercise Plan supine cervical stab   Consulted and Agree with Plan of Care Patient        Problem List Patient Active Problem List   Diagnosis Date Noted  . Degenerative cervical disc 10/04/2014  . [place] arthritis 08/19/2014  . Ankle sprain 04/14/2014  . Low back  pain 11/26/2013  . Numbness and tingling in left arm 07/09/2013  . Shoulder bursitis 06/18/2013  . Anxiety state, unspecified 12/30/2012  . Other malaise and fatigue 12/30/2012  . Osteoporosis, unspecified 11/13/2012  . Osteoarthritis of multiple joints 04/18/2011  . Incontinence of urine 04/18/2011  . HYPERLIPIDEMIA-MIXED 01/08/2010  . MULTIPLE SCLEROSIS 01/08/2010  . DYSPNEA 01/05/2010    Buren Havey 07/19/2015, 12:42 PM  [facility]  [place], [place], [address] Phone: [phone]   Fax:  [phone]  Name: TASHAWNDA GMEREK MRN: [id number] Date of Birth: 06-24-1950    Raeford Razor, PT 07/19/2015 12:43 PM Phone: [phone] Fax: [phone]

## 2015-07-21 DIAGNOSIS — N3941 Urge incontinence: Secondary | ICD-10-CM | POA: Diagnosis not present

## 2015-07-21 DIAGNOSIS — N3281 Overactive bladder: Secondary | ICD-10-CM | POA: Diagnosis not present

## 2015-07-21 DIAGNOSIS — Z0000 Encounter for general adult medical examination without abnormal findings: Secondary | ICD-10-CM | POA: Diagnosis not present

## 2015-07-21 DIAGNOSIS — N393 Stress incontinence (female) (male): Secondary | ICD-10-CM | POA: Diagnosis not present

## 2015-07-25 ENCOUNTER — Ambulatory Visit: Payer: 59 | Admitting: Physical Therapy

## 2015-07-25 DIAGNOSIS — M5090 Cervical disc disorder, unspecified, unspecified cervical region: Secondary | ICD-10-CM | POA: Diagnosis not present

## 2015-07-25 DIAGNOSIS — R200 Anesthesia of skin: Secondary | ICD-10-CM | POA: Diagnosis not present

## 2015-07-25 DIAGNOSIS — M542 Cervicalgia: Secondary | ICD-10-CM

## 2015-07-25 DIAGNOSIS — M5010 Cervical disc disorder with radiculopathy, unspecified cervical region: Secondary | ICD-10-CM

## 2015-07-25 DIAGNOSIS — R202 Paresthesia of skin: Secondary | ICD-10-CM

## 2015-07-25 NOTE — Therapy (Signed)
[place], [place], [address] Phone: [phone]   Fax:  [phone]  Physical Therapy Treatment  Patient Details  Name: Katie Maldonado MRN: [id number] Date of Birth: 08/11/1950 Referring Provider: Dr. Andrey Spearman  Encounter Date: 07/25/2015      PT End of Session - 07/25/15 1553    Visit Number 3   Number of Visits 9   Date for PT Re-Evaluation 08/17/15   Authorization Type [facility] UMR   PT Start Time [id number]   PT Stop Time 1506   PT Time Calculation (min) 51 min   Activity Tolerance Patient tolerated treatment well   Behavior During Therapy [facility] for tasks assessed/performed      Past Medical History  Diagnosis Date  . Multiple sclerosis ([place])   . Other and unspecified hyperlipidemia   . Obesity, unspecified   . Allergy   . Arthritis   . Neuromuscular disorder ([place])   . Osteoporosis   . TIA (transient ischemic attack) 20  . Migraine   . Hypersomnolence   . Anxiety     Past Surgical History  Procedure Laterality Date  . Hernia repair    . Tubal ligation    . Wrist surgery    . Arm surgery left    . Abdominal hysterectomy      Fibroids    There were no vitals filed for this visit.  Visit Diagnosis:  Neck pain  Cervical neck pain with evidence of disc disease  Numbness and tingling in left arm  Cervical disc disorder with radiculopathy of cervical region      Subjective Assessment - 07/25/15 1422    Subjective "I am doing okay, I am just tired of all the numbness and the pain in the neck"   Currently in Pain? Yes   Pain Score 5    Pain Location Neck   Pain Orientation Left   Pain Descriptors / Indicators Tingling;Numbness;Nagging   Pain Onset More than a month ago   Pain Frequency Constant   Aggravating Factors  hold head neutral or ext, turning head   Pain Relieving Factors heat hleps, looking down   Pain Score 5   Pain Location Back   Pain Orientation Left   Pain  Descriptors / Indicators Pins and needles;Aching   Pain Onset More than a month ago   Pain Frequency Constant   Aggravating Factors  sitting to long   Pain Relieving Factors heat                         [facility] Adult PT Treatment/Exercise - 07/25/15 0001    Cryotherapy   Number Minutes Cryotherapy 10 Minutes   Cryotherapy Location Shoulder   Type of Cryotherapy Ice pack   Manual Therapy   Manual Therapy Taping   Soft tissue mobilization IASTM of bil upper trpas, and sub-occipitals   Manual Traction gentle 30 sec x 5 sec hold   McConnell trial of L inhibition upper trap taping   Neck Exercises: Stretches   Upper Trapezius Stretch 2 reps;30 seconds   Levator Stretch 2 reps;30 seconds          Trigger Point Dry Needling - 07/25/15 1547    Consent Given? Yes   Education Handout Provided Yes   Muscles Treated Upper Body Upper trapezius   Upper Trapezius Response Twitch reponse elicited;Palpable increased muscle length  bil upper traps  PT Education - 07/25/15 1608    Education provided Yes   Education Details dry needling education, upper trap inhibition taping,    Person(s) Educated Patient   Methods Explanation   Comprehension Verbalized understanding             PT Long Term Goals - 07/19/15 1236    PT LONG TERM GOAL #1   Title Pt will report at least 25% improvement in LUE numbness and tingling.  (08-17-15)   Status New   PT LONG TERM GOAL #2   Title Increase R cervical rotation to at least 45 degrees and R lateral flexion to at least 40 degrees to increase ease with head movements during environmental scanning and driving.  ([id number])   Status New   PT LONG TERM GOAL #3   Title Independent in HEP for cervical ROM and LUE strengthening.  (08-17-15)   Status New   PT LONG TERM GOAL #4   Title Pt will be able to do UE/LE and core strengthening at the gym and report no increase in pain    Time 4   Period Weeks   Status New   PT  LONG TERM GOAL #5   Title Pt will be I with concepts of osteoporosis and posture to prevent reinjury   Time 4   Period Weeks   Status New               Plan - 07/25/15 1556    Clinical Impression Statement Katie Maldonado reports having about the same soreness in her shoulders with tingling in the L side of her body. She provided consent for dry needling. performed dry needling of bil upper traps where multiple twitches were noted and relief of tension following IASTM. performed trial of L upper trap inhibition. Used ice pack following todays sessoin to decrease soreness from the dry needling. She reported decreased pain rated at 4/10 in the shoulders.         Problem List Patient Active Problem List   Diagnosis Date Noted  . Degenerative cervical disc 10/04/2014  . [place] arthritis 08/19/2014  . Ankle sprain 04/14/2014  . Low back pain 11/26/2013  . Numbness and tingling in left arm 07/09/2013  . Shoulder bursitis 06/18/2013  . Anxiety state, unspecified 12/30/2012  . Other malaise and fatigue 12/30/2012  . Osteoporosis, unspecified 11/13/2012  . Osteoarthritis of multiple joints 04/18/2011  . Incontinence of urine 04/18/2011  . HYPERLIPIDEMIA-MIXED 01/08/2010  . MULTIPLE SCLEROSIS 01/08/2010  . DYSPNEA 01/05/2010   Starr Lake PT, DPT, LAT, ATC  07/25/2015  4:09 PM      [facility] [address] [place], [place], [address] Phone: [phone]   Fax:  [phone]  Name: Katie Maldonado MRN: [id number] Date of Birth: 1950/08/23

## 2015-08-01 ENCOUNTER — Ambulatory Visit: Payer: 59 | Attending: Diagnostic Neuroimaging | Admitting: Physical Therapy

## 2015-08-01 DIAGNOSIS — R202 Paresthesia of skin: Secondary | ICD-10-CM | POA: Insufficient documentation

## 2015-08-01 DIAGNOSIS — M6281 Muscle weakness (generalized): Secondary | ICD-10-CM | POA: Diagnosis not present

## 2015-08-01 DIAGNOSIS — M542 Cervicalgia: Secondary | ICD-10-CM | POA: Diagnosis not present

## 2015-08-01 NOTE — Therapy (Signed)
[place], [place], [address] Phone: [phone]   Fax:  [phone]  Physical Therapy Treatment  Patient Details  Name: Katie Maldonado MRN: [id number] Date of Birth: 1950-12-12 Referring Provider: Dr. Andrey Spearman  Encounter Date: 08/01/2015      PT End of Session - 08/01/15 1329    Visit Number 4   Number of Visits 9   Date for PT Re-Evaluation 08/17/15   Authorization Type [facility]   PT Start Time 1019   PT Stop Time 1115   PT Time Calculation (min) 56 min   Activity Tolerance Patient tolerated treatment well   Behavior During Therapy [facility] for tasks assessed/performed      Past Medical History  Diagnosis Date  . Multiple sclerosis ([place])   . Other and unspecified hyperlipidemia   . Obesity, unspecified   . Allergy   . Arthritis   . Neuromuscular disorder ([place])   . Osteoporosis   . TIA (transient ischemic attack) 20  . Migraine   . Hypersomnolence   . Anxiety     Past Surgical History  Procedure Laterality Date  . Hernia repair    . Tubal ligation    . Wrist surgery    . Arm surgery left    . Abdominal hysterectomy      Fibroids    There were no vitals filed for this visit.  Visit Diagnosis:  Cervicalgia  Muscle weakness (generalized)  Paresthesia of skin      Subjective Assessment - 08/01/15 1020    Subjective The needling was "torture" but it may have helped.  I'm still hurting and willing to try it again.     Currently in Pain? Yes   Pain Score 5    Pain Location Neck   Pain Orientation Left   Pain Descriptors / Indicators Tingling;Numbness;Aching   Pain Type Chronic pain   Pain Onset More than a month ago   Pain Frequency Constant   Pain Score 5   Pain Location Back   Pain Orientation Lower   Pain Descriptors / Indicators Aching;Pins and needles  tingling on L    Pain Type Chronic pain   Pain Onset More than a month ago              [facility] Adult  PT Treatment/Exercise - 08/01/15 1034    Neck Exercises: Supine   Neck Retraction 10 reps;5 secs   Neck Retraction Limitations nod into soft ball    Shoulder Exercises: Supine   Horizontal ABduction Strengthening;Both;10 reps;Theraband   Theraband Level (Shoulder Horizontal ABduction) Level 1 (Yellow)   Other Supine Exercises Pilates Reformer: Supine Arms 1 Red: Arcs, cirles with max cues    Other Supine Exercises Prone overhead press 1 Red and single arm 1 Blue . Seated on long box: 1 Red Row and extension  x 10 each   max cues for head neck and shoulder alignment    Shoulder Exercises: Seated   Other Seated Exercises Pilates reformer seated on long box row and ext. 1 red spring    Shoulder Exercises: ROM/Strengthening   UBE (Upper Arm Bike) 5 min FW and back, level 1    Moist Heat Therapy   Number Minutes Moist Heat 15 Minutes   Moist Heat Location Cervical                PT Education - 08/01/15 1328    Education provided Yes   Education Details Pilates and posture,  alignment    Person(s) Educated Patient   Methods Explanation;Tactile cues;Verbal cues   Comprehension Verbalized understanding;Need further instruction             PT Long Term Goals - 08/01/15 1533    PT LONG TERM GOAL #1   Title Pt will report at least 25% improvement in LUE numbness and tingling.  (08-17-15)   Status On-going   PT LONG TERM GOAL #2   Title Increase R cervical rotation to at least 45 degrees and R lateral flexion to at least 40 degrees to increase ease with head movements during environmental scanning and driving.  ([id number])   Status Unable to assess   PT LONG TERM GOAL #3   Title Independent in HEP for cervical ROM and LUE strengthening.  (08-17-15)   Status On-going   PT LONG TERM GOAL #4   Title Pt will be able to do UE/LE and core strengthening at the gym and report no increase in pain    Status On-going   PT LONG TERM GOAL #5   Title Pt will be I with concepts of  osteoporosis and posture to prevent reinjury   Status On-going               Plan - 08/01/15 1329    Clinical Impression Statement Patient will cont to benefit from skilled PT to address cervicalgia, paresthesia of skin and general weakness in upper body.  She has not reported any lasting functional improvement but is hopeful that trigger point dry needling will help.  Needed cues for upper body aligmment with Reformer exercises and it was difficutl for her to maintain those corrections.         Problem List Patient Active Problem List   Diagnosis Date Noted  . Degenerative cervical disc 10/04/2014  . [place] arthritis 08/19/2014  . Ankle sprain 04/14/2014  . Low back pain 11/26/2013  . Numbness and tingling in left arm 07/09/2013  . Shoulder bursitis 06/18/2013  . Anxiety state, unspecified 12/30/2012  . Other malaise and fatigue 12/30/2012  . Osteoporosis, unspecified 11/13/2012  . Osteoarthritis of multiple joints 04/18/2011  . Incontinence of urine 04/18/2011  . HYPERLIPIDEMIA-MIXED 01/08/2010  . MULTIPLE SCLEROSIS 01/08/2010  . DYSPNEA 01/05/2010    PAA,JENNIFER 08/01/2015, 3:33 PM  [facility] [address] [place], [place], [address] Phone: [phone]   Fax:  [phone]  Name: Katie Maldonado MRN: [id number] Date of Birth: [birth year]    Raeford Razor, PT 08/01/2015 3:33 PM Phone: [phone] Fax: [phone]

## 2015-08-03 ENCOUNTER — Ambulatory Visit: Payer: 59 | Admitting: Physical Therapy

## 2015-08-03 DIAGNOSIS — M6281 Muscle weakness (generalized): Secondary | ICD-10-CM

## 2015-08-03 DIAGNOSIS — M542 Cervicalgia: Secondary | ICD-10-CM | POA: Diagnosis not present

## 2015-08-03 DIAGNOSIS — R202 Paresthesia of skin: Secondary | ICD-10-CM | POA: Diagnosis not present

## 2015-08-03 NOTE — Therapy (Signed)
[place], [place], [address] Phone: [phone]   Fax:  [phone]  Physical Therapy Treatment  Patient Details  Name: Katie Maldonado MRN: [id number] Date of Birth: [birth year] Referring Provider: Dr. Andrey Spearman  Encounter Date: 08/03/2015      PT End of Session - 08/03/15 1401    Visit Number 5   Number of Visits 9   Date for PT Re-Evaluation 08/17/15   Authorization Type [facility]   PT Start Time 1326   PT Stop Time 1427   PT Time Calculation (min) 61 min   Activity Tolerance Patient tolerated treatment well   Behavior During Therapy [facility] for tasks assessed/performed      Past Medical History  Diagnosis Date  . Multiple sclerosis ([place])   . Other and unspecified hyperlipidemia   . Obesity, unspecified   . Allergy   . Arthritis   . Neuromuscular disorder ([place])   . Osteoporosis   . TIA (transient ischemic attack) 20  . Migraine   . Hypersomnolence   . Anxiety     Past Surgical History  Procedure Laterality Date  . Hernia repair    . Tubal ligation    . Wrist surgery    . Arm surgery left    . Abdominal hysterectomy      Fibroids    There were no vitals filed for this visit.  Visit Diagnosis:  Cervicalgia  Muscle weakness (generalized)  Paresthesia of skin      Subjective Assessment - 08/03/15 1328    Subjective Pain was really bad last night.  Right now I'm an 8/10.     Currently in Pain? Yes   Pain Score 8    Pain Location Neck   Pain Orientation Left   Pain Descriptors / Indicators Aching;Tingling   Pain Type Chronic pain   Pain Radiating Towards L UE    Pain Onset More than a month ago   Pain Frequency Constant   Pain Score 8   Pain Location Back   Pain Orientation Lower   Pain Descriptors / Indicators Aching;Throbbing   Pain Type Chronic pain   Pain Radiating Towards to Knee L   Pain Onset More than a month ago   Pain Frequency Constant   Aggravating  Factors  sitting   Pain Relieving Factors heat           [facility] Adult PT Treatment/Exercise - 08/03/15 1355    Lumbar Exercises: Supine   Ab Set 10 reps   Clam 10 reps   Heel Slides 10 reps   Bent Knee Raise 10 reps   Electrical Stimulation   Electrical Stimulation Location post neck bilateral and lumbat bilat.    Electrical Stimulation Action IFC   Electrical Stimulation Parameters 12   Electrical Stimulation Goals Pain   Manual Therapy   Soft tissue mobilization prone to upper back, upper trap, rhomboids, levator. Pt with atrophy of L upper trap.  Decr tolerance for manual trigger point release.  Diffculty relaxing.   tol mod pressure but not well                PT Education - 08/03/15 1410    Education provided Yes   Education Details benefit of massage, trigger points, HEP for stab    Person(s) Educated Patient   Methods Explanation;Demonstration;Verbal cues;Handout   Comprehension Verbalized understanding;Returned demonstration;Need further instruction             PT Long Term  Goals - 08/01/15 1533    PT LONG TERM GOAL #1   Title Pt will report at least 25% improvement in LUE numbness and tingling.  (08-17-15)   Status On-going   PT LONG TERM GOAL #2   Title Increase R cervical rotation to at least 45 degrees and R lateral flexion to at least 40 degrees to increase ease with head movements during environmental scanning and driving.  ([id number])   Status Unable to assess   PT LONG TERM GOAL #3   Title Independent in HEP for cervical ROM and LUE strengthening.  (08-17-15)   Status On-going   PT LONG TERM GOAL #4   Title Pt will be able to do UE/LE and core strengthening at the gym and report no increase in pain    Status On-going   PT LONG TERM GOAL #5   Title Pt will be I with concepts of osteoporosis and posture to prevent reinjury   Status On-going               Plan - 08/03/15 1411    Clinical Impression Statement Pt will cont to benefit from  PT to address cervicalgia, paresthesia, and general weakness.  She was worse today but her degree of myofascial tightness and pain will likely need several more weeks of PT. Her pain was delayed  1 1/2 day post last session so I don't think it was our session.  Urged her to cont with attempts to move, stretch as best she can.     PT Next Visit Plan cervical/scapular stabilization, lumbar, manual and modalities for pain.  Would like to try TrP DN.    PT Home Exercise Plan supine cervical stab added prepilates    Consulted and Agree with Plan of Care Patient        Problem List Patient Active Problem List   Diagnosis Date Noted  . Degenerative cervical disc 10/04/2014  . [place] arthritis 08/19/2014  . Ankle sprain 04/14/2014  . Low back pain 11/26/2013  . Numbness and tingling in left arm 07/09/2013  . Shoulder bursitis 06/18/2013  . Anxiety state, unspecified 12/30/2012  . Other malaise and fatigue 12/30/2012  . Osteoporosis, unspecified 11/13/2012  . Osteoarthritis of multiple joints 04/18/2011  . Incontinence of urine 04/18/2011  . HYPERLIPIDEMIA-MIXED 01/08/2010  . MULTIPLE SCLEROSIS 01/08/2010  . DYSPNEA 01/05/2010    Bosco Paparella 08/03/2015, 2:17 PM  [facility] [address] [place], [place], [address] Phone: [phone]   Fax:  [phone]  Name: Katie Maldonado MRN: [id number] Date of Birth: [birth year]   Raeford Razor, PT 08/03/2015 2:17 PM Phone: [phone] Fax: [phone]

## 2015-08-03 NOTE — Patient Instructions (Addendum)

## 2015-08-08 ENCOUNTER — Encounter: Payer: Self-pay | Admitting: Family Medicine

## 2015-08-08 ENCOUNTER — Ambulatory Visit: Payer: 59 | Admitting: Physical Therapy

## 2015-08-08 ENCOUNTER — Ambulatory Visit (INDEPENDENT_AMBULATORY_CARE_PROVIDER_SITE_OTHER): Payer: 59 | Admitting: Family Medicine

## 2015-08-08 VITALS — BP 130/78 | HR 73 | Temp 98.3°F | Resp 16 | Ht 64.0 in | Wt 157.0 lb

## 2015-08-08 DIAGNOSIS — R202 Paresthesia of skin: Secondary | ICD-10-CM

## 2015-08-08 DIAGNOSIS — M810 Age-related osteoporosis without current pathological fracture: Secondary | ICD-10-CM | POA: Diagnosis not present

## 2015-08-08 DIAGNOSIS — M542 Cervicalgia: Secondary | ICD-10-CM

## 2015-08-08 DIAGNOSIS — M6281 Muscle weakness (generalized): Secondary | ICD-10-CM | POA: Diagnosis not present

## 2015-08-08 DIAGNOSIS — M5030 Other cervical disc degeneration, unspecified cervical region: Secondary | ICD-10-CM | POA: Diagnosis not present

## 2015-08-08 DIAGNOSIS — G35 Multiple sclerosis: Secondary | ICD-10-CM | POA: Diagnosis not present

## 2015-08-08 DIAGNOSIS — Z6826 Body mass index (BMI) 26.0-26.9, adult: Secondary | ICD-10-CM

## 2015-08-08 NOTE — Patient Instructions (Signed)
Issued cervical isometrics from drawer

## 2015-08-08 NOTE — Patient Instructions (Addendum)
Katie Maldonado, Yoga 4 All- gentle yoga or chair yoga    IF you received an x-ray today, you will receive an invoice from [facility]. Please contact [facility] at [phone] with questions or concerns regarding your invoice.   IF you received labwork today, you will receive an invoice from [employer]. Please contact [facility] at [phone] with questions or concerns regarding your invoice.   Our billing staff will not be able to assist you with questions regarding bills from these companies.  You will be contacted with the lab results as soon as they are available. The fastest way to get your results is to activate your My Chart account. Instructions are located on the last page of this paperwork. If you have not heard from [place] regarding the results in 2 weeks, please contact this office.

## 2015-08-08 NOTE — Progress Notes (Signed)
Subjective:    Patient ID: Katie Maldonado, female    DOB: [birth year], 65 y.o.   MRN: [id number]  HPI This is a pleasant 65 yo female who presents today for follow up of MS, DDD, osteoporosis.  She continues to be in remission from [place] stand point.  She has been undergoing PT with dry needling for her cervicalgia, she has had good relief of pain, but continues to have numbness and tingling down left arm.  She started Fosamax 1/17 and tolerated well. She took for 12 weeks and thought she needed office visit for refills. Record show she still has refills available.  She continues to watch diet and is actively trying to lose weight. She is able to walk for exercise.   Past Medical History  Diagnosis Date  . Multiple sclerosis ([place])   . Other and unspecified hyperlipidemia   . Obesity, unspecified   . Allergy   . Arthritis   . Neuromuscular disorder ([place])   . Osteoporosis   . TIA (transient ischemic attack) 20  . Migraine   . Hypersomnolence   . Anxiety    Past Surgical History  Procedure Laterality Date  . Hernia repair    . Tubal ligation    . Wrist surgery    . Arm surgery left    . Abdominal hysterectomy      Fibroids   Family History  Problem Relation Age of Onset  . Emphysema Father   . Cancer Brother     bone  . Heart disease Brother   . Cancer Maternal Grandmother   . Heart attack Brother   . Cancer Maternal Grandfather   . Cancer Paternal Grandmother   . Cancer Paternal Grandfather    Social History  Substance Use Topics  . Smoking status: Former Smoker -- 37 years    Types: Cigarettes    Quit date: 02/26/2004  . Smokeless tobacco: Never Used  . Alcohol Use: 0.0 oz/week     Comment: occ      Review of Systems No chest pain, no SOB, no fever or chills, no edema, no recent falls    Objective:   Physical Exam Physical Exam  Constitutional: Oriented to person, place, and time. She appears well-developed and well-nourished.  HENT:  Head:  Normocephalic and atraumatic.  Eyes: Conjunctivae are normal.  Neck: Normal range of motion. Neck supple.  Cardiovascular: Normal rate, regular rhythm and normal heart sounds.   Pulmonary/Chest: Effort normal and breath sounds normal.  Musculoskeletal: Normal range of motion.  Neurological: Alert and oriented to person, place, and time.  Skin: Skin is warm and dry.  Psychiatric: Normal mood and affect. Behavior is normal. Judgment and thought content normal.  Vitals reviewed.     BP 130/78 mmHg  Pulse 73  Temp(Src) 98.3 F (36.8 C)  Resp 16  Ht 5\' 4"  (1.626 m)  Wt 157 lb (71.215 kg)  BMI 26.94 kg/m2 Wt Readings from Last 3 Encounters:  08/08/15 157 lb (71.215 kg)  07/07/15 158 lb 12.8 oz (72.031 kg)  06/26/15 162 lb (73.483 kg)       Assessment & Plan:  1. MULTIPLE SCLEROSIS - continue neuro follow up as scheduled  2. Degenerative cervical disc - continued PT  3. Osteoporosis - continue Fosamax, will recheck done density after 1 year of treatment  4. BMI 26.0-26.9,adult - she is doing well on low carb diet and encouraged her to continue healthy food choices and regular exercise  - CPE in 6  months   Clarene Reamer, FNP-BC  [facility]  08/11/2015 7:48 AM

## 2015-08-08 NOTE — Therapy (Signed)
[place], [place], [address] Phone: [phone]   Fax:  [phone]  Physical Therapy Treatment  Patient Details  Name: Katie Maldonado MRN: [id number] Date of Birth: [birth year] Referring Provider: Dr. Andrey Spearman  Encounter Date: 08/08/2015      PT End of Session - 08/08/15 1636    Visit Number 6   Number of Visits 18   Date for PT Re-Evaluation 09/25/15   PT Start Time 1500   PT Stop Time 1545   PT Time Calculation (min) 45 min   Activity Tolerance Patient tolerated treatment well   Behavior During Therapy [facility] for tasks assessed/performed      Past Medical History  Diagnosis Date  . Multiple sclerosis ([place])   . Other and unspecified hyperlipidemia   . Obesity, unspecified   . Allergy   . Arthritis   . Neuromuscular disorder ([place])   . Osteoporosis   . TIA (transient ischemic attack) 20  . Migraine   . Hypersomnolence   . Anxiety     Past Surgical History  Procedure Laterality Date  . Hernia repair    . Tubal ligation    . Wrist surgery    . Arm surgery left    . Abdominal hysterectomy      Fibroids    There were no vitals filed for this visit.      Subjective Assessment - 08/08/15 1513    Subjective I feel better, still tingly but pain is only a 4/10.  Last session had lasting relief into the night.    Currently in Pain? Yes   Pain Score 4    Pain Location Neck   Pain Orientation Left   Pain Descriptors / Indicators Aching;Tingling   Pain Type Chronic pain   Pain Radiating Towards LUE to fingertips   Pain Onset More than a month ago   Pain Frequency Constant                         [facility] Adult PT Treatment/Exercise - 08/08/15 1517    Neck Exercises: Supine   Cervical Isometrics Flexion;Extension;Right lateral flexion;Left lateral flexion;Right rotation;Left rotation;5 secs   Cervical Isometrics Limitations done by PT in supine   then done by  patient in sitting flexion and ext   Neck Retraction 10 reps;5 secs   Neck Retraction Limitations nod into soft ball    Cervical Rotation 20 reps  and small ROM flexion/ext    Shoulder Flexion Both;10 reps;Other (comment)   Shoulder Flexion Weights (lbs) stabilization    Shoulder ABduction Both;10 reps   Shoulder Abduction Weights (lbs) stabilization    Manual Therapy   Soft tissue mobilization prone to upper back, upper trap, rhomboids, levator. Pt with atrophy of L upper trap.  Decr tolerance for manual trigger point release.  Diffculty relaxing.   tol mod pressure but not well   Neck Exercises: Stretches   Upper Trapezius Stretch 2 reps;30 seconds   Levator Stretch 2 reps;30 seconds   Other Neck Stretches extension hands behind head x 2                 PT Education - 08/08/15 1545    Education provided Yes   Education Details upper cross syndrome, isometrics for C spine, explanation of terms from bone Density and MRI , used model    Person(s) Educated Patient   Methods Explanation;Demonstration;Handout   Comprehension Verbalized understanding;Returned demonstration  PT Long Term Goals - 08/01/15 1533    PT LONG TERM GOAL #1   Title Pt will report at least 25% improvement in LUE numbness and tingling.  (08-17-15)   Status On-going   PT LONG TERM GOAL #2   Title Increase R cervical rotation to at least 45 degrees and R lateral flexion to at least 40 degrees to increase ease with head movements during environmental scanning and driving.  (9-52-84)   Status Unable to assess   PT LONG TERM GOAL #3   Title Independent in HEP for cervical ROM and LUE strengthening.  (08-17-15)   Status On-going   PT LONG TERM GOAL #4   Title Pt will be able to do UE/LE and core strengthening at the gym and report no increase in pain    Status On-going   PT LONG TERM GOAL #5   Title Pt will be I with concepts of osteoporosis and posture to prevent reinjury   Status On-going                Plan - 08/08/15 1545    Clinical Impression Statement Patient requested info on interpreting her MRI and BMD tests.  Osteoporosis in spine and hip.  Less pain overall today. No goals met further.     Rehab Potential Good   PT Frequency 2x / week   PT Duration 4 weeks   PT Treatment/Interventions ADLs/Self Care Home Management;Moist Heat;Therapeutic exercise;Therapeutic activities;Functional mobility training;Traction;Ultrasound;Neuromuscular re-education;Patient/family education;Passive range of motion;Electrical Stimulation;Cryotherapy;Dry needling   PT Next Visit Plan cervical/scapular stabilization, lumbar, manual and modalities for pain.  Would like to try TrP DN.    PT Home Exercise Plan supine cervical stab added prepilates and C isometrics   Consulted and Agree with Plan of Care Patient      Patient will benefit from skilled therapeutic intervention in order to improve the following deficits and impairments:  Postural dysfunction, Pain, Decreased range of motion, Decreased strength, Impaired sensation, Impaired flexibility, Improper body mechanics  Visit Diagnosis: Cervicalgia  Muscle weakness (generalized)  Paresthesia of skin     Problem List Patient Active Problem List   Diagnosis Date Noted  . Degenerative cervical disc 10/04/2014  . [place] arthritis 08/19/2014  . Ankle sprain 04/14/2014  . Low back pain 11/26/2013  . Numbness and tingling in left arm 07/09/2013  . Shoulder bursitis 06/18/2013  . Anxiety state, unspecified 12/30/2012  . Other malaise and fatigue 12/30/2012  . Osteoporosis, unspecified 11/13/2012  . Osteoarthritis of multiple joints 04/18/2011  . Incontinence of urine 04/18/2011  . HYPERLIPIDEMIA-MIXED 01/08/2010  . MULTIPLE SCLEROSIS 01/08/2010  . DYSPNEA 01/05/2010    PAA,JENNIFER 08/08/2015, 4:43 PM  [facility] [address] [place], [place], [address] Phone:  [phone]   Fax:  [phone]  Name: Katie Maldonado MRN: [id number] Date of Birth: 10-19-1950    Raeford Razor, PT 08/08/2015 4:43 PM Phone: [phone] Fax: [phone]

## 2015-08-10 ENCOUNTER — Other Ambulatory Visit: Payer: Self-pay | Admitting: Family Medicine

## 2015-08-10 MED FILL — ALENDRONATE NA 70 MG TAB: 70 | 12 days supply | Qty: 12 | Fill #1

## 2015-08-11 ENCOUNTER — Other Ambulatory Visit: Payer: Self-pay | Admitting: Family Medicine

## 2015-08-14 ENCOUNTER — Ambulatory Visit: Payer: 59 | Admitting: Physical Therapy

## 2015-08-14 DIAGNOSIS — M6281 Muscle weakness (generalized): Secondary | ICD-10-CM | POA: Diagnosis not present

## 2015-08-14 DIAGNOSIS — M542 Cervicalgia: Secondary | ICD-10-CM

## 2015-08-14 DIAGNOSIS — R202 Paresthesia of skin: Secondary | ICD-10-CM

## 2015-08-14 NOTE — Therapy (Signed)
[place], [place], [address] Phone: [phone]   Fax:  [phone]  Physical Therapy Treatment  Patient Details  Name: Katie Maldonado MRN: [id number] Date of Birth: 08-05-1950 Referring Provider: Dr. Andrey Spearman  Encounter Date: 08/14/2015      PT End of Session - 08/14/15 0850    Visit Number 7   Number of Visits 18   Date for PT Re-Evaluation 09/25/15   PT Start Time 0850   PT Stop Time 0945   PT Time Calculation (min) 55 min   Activity Tolerance Patient tolerated treatment well   Behavior During Therapy [facility] for tasks assessed/performed      Past Medical History  Diagnosis Date  . Multiple sclerosis ([place])   . Other and unspecified hyperlipidemia   . Obesity, unspecified   . Allergy   . Arthritis   . Neuromuscular disorder ([place])   . Osteoporosis   . TIA (transient ischemic attack) 20  . Migraine   . Hypersomnolence   . Anxiety     Past Surgical History  Procedure Laterality Date  . Hernia repair    . Tubal ligation    . Wrist surgery    . Arm surgery left    . Abdominal hysterectomy      Fibroids    There were no vitals filed for this visit.      Subjective Assessment - 08/14/15 0850    Subjective "The numbness is the same, and the pain is the same as last session"   Currently in Pain? Yes   Pain Score 4    Pain Location Neck   Pain Orientation Left   Pain Descriptors / Indicators Aching;Tingling   Pain Type Chronic pain   Pain Onset More than a month ago   Pain Frequency Constant   Aggravating Factors  pulling the head back, turning the head   Pain Relieving Factors heat, looking down                         [facility] Adult PT Treatment/Exercise - 08/14/15 0001    Self-Care   Self-Care Other Self-Care Comments   Other Self-Care Comments  using tennis balls for sub-occipital release   Moist Heat Therapy   Number Minutes Moist Heat 10 Minutes    Moist Heat Location Cervical  bil upper trpas   Manual Therapy   Manual Therapy Joint mobilization   Manual therapy comments sub-occipital release   edcuated how to perform at home with tennis balls   Joint Mobilization t1-T7 grade 2 P>A mobs   Soft tissue mobilization IASTM of bil upper traps/ levator scapulae   McConnell bil upper trap inhibition taping   Neck Exercises: Stretches   Upper Trapezius Stretch 2 reps;30 seconds   Levator Stretch 2 reps;30 seconds          Trigger Point Dry Needling - 08/14/15 0856    Consent Given? Yes   Education Handout Provided No   Muscles Treated Upper Body Upper trapezius;Levator scapulae   Upper Trapezius Response Twitch reponse elicited;Palpable increased muscle length   Levator Scapulae Response Twitch response elicited;Palpable increased muscle length              PT Education - 08/14/15 0940    Education provided Yes   Education Details sub-occipital release   Person(s) Educated Patient   Methods Explanation   Comprehension Verbalized understanding  PT Long Term Goals - 08/14/15 0944    PT LONG TERM GOAL #1   Title Pt will report at least 25% improvement in LUE numbness and tingling.  (08-17-15)   Time 4   Period Weeks   Status On-going   PT LONG TERM GOAL #2   Title Increase R cervical rotation to at least 45 degrees and R lateral flexion to at least 40 degrees to increase ease with head movements during environmental scanning and driving.  ([id number])   Time 4   Period Weeks   Status On-going   PT LONG TERM GOAL #3   Title Independent in HEP for cervical ROM and LUE strengthening.  (08-17-15)   Time 4   Period Weeks   Status On-going   PT LONG TERM GOAL #4   Title Pt will be able to do UE/LE and core strengthening at the gym and report no increase in pain    Time 4   Period Weeks   Status On-going   PT LONG TERM GOAL #5   Title Pt will be I with concepts of osteoporosis and posture to prevent  reinjury   Time 4   Period Weeks   Status On-going               Plan - 08/14/15 0941    Clinical Impression Statement Hillory reports she is doing about the same since the last session. She provided consent for DN of bil upper traps and levator scapulae and monitored pt during treatment. Following IASTM and mobs she reported pain dropped to 3/10. Educated how to peroform manual sub-occipital release with tennis balls which she rpeorted it helped during todays session. Utilized MHP post session to decrease soreness from DN.  post session she reported pain 3/10.    PT Next Visit Plan cervical/scapular stabilization, lumbar, manual and modalities for pain.  Would like to try TrP DN.    PT Home Exercise Plan sub-occipital release with tennis balls   Consulted and Agree with Plan of Care Patient      Patient will benefit from skilled therapeutic intervention in order to improve the following deficits and impairments:  Postural dysfunction, Pain, Decreased range of motion, Decreased strength, Impaired sensation, Impaired flexibility, Improper body mechanics  Visit Diagnosis: Cervicalgia  Muscle weakness (generalized)  Paresthesia of skin     Problem List Patient Active Problem List   Diagnosis Date Noted  . Degenerative cervical disc 10/04/2014  . [place] arthritis 08/19/2014  . Ankle sprain 04/14/2014  . Low back pain 11/26/2013  . Numbness and tingling in left arm 07/09/2013  . Shoulder bursitis 06/18/2013  . Anxiety state, unspecified 12/30/2012  . Other malaise and fatigue 12/30/2012  . Osteoporosis 11/13/2012  . Osteoarthritis of multiple joints 04/18/2011  . Incontinence of urine 04/18/2011  . HYPERLIPIDEMIA-MIXED 01/08/2010  . MULTIPLE SCLEROSIS 01/08/2010  . DYSPNEA 01/05/2010   Starr Lake PT, DPT, LAT, ATC  08/14/2015  9:46 AM      [facility] [address] [place], [place], [address] Phone:  [phone]   Fax:  [phone]  Name: SELENNE PESCHEL MRN: [id number] Date of Birth: 1950-05-12

## 2015-08-14 NOTE — Telephone Encounter (Signed)
Pt just had check up but don't see this med discussed. OK to RF?

## 2015-08-15 ENCOUNTER — Other Ambulatory Visit: Payer: Self-pay | Admitting: Family Medicine

## 2015-08-15 MED FILL — OMEPRAZOLE DR 20 MG CAPSULE: 20 | 90 days supply | Qty: 90 | Fill #0

## 2015-08-15 MED FILL — CITALOPRAM HBR 20 MG TABLET: 20 | 90 days supply | Qty: 90 | Fill #0

## 2015-08-15 NOTE — Telephone Encounter (Signed)
Does patient need to RTC she was just seen do not see Celexa ? Can we refill

## 2015-08-16 ENCOUNTER — Encounter: Payer: Self-pay | Admitting: Physical Therapy

## 2015-08-16 ENCOUNTER — Ambulatory Visit: Payer: 59 | Admitting: Physical Therapy

## 2015-08-16 DIAGNOSIS — M6281 Muscle weakness (generalized): Secondary | ICD-10-CM | POA: Diagnosis not present

## 2015-08-16 DIAGNOSIS — M542 Cervicalgia: Secondary | ICD-10-CM

## 2015-08-16 DIAGNOSIS — R202 Paresthesia of skin: Secondary | ICD-10-CM

## 2015-08-16 NOTE — Therapy (Addendum)
[place], [place], [address] Phone: [phone]   Fax:  [phone]  Physical Therapy Treatment  Patient Details  Name: Katie Maldonado MRN: [id number] Date of Birth: 02-07-1951 Referring Provider: Dr. Andrey Spearman  Encounter Date: 08/16/2015      PT End of Session - 08/16/15 1150    Visit Number 8   Number of Visits 18   Date for PT Re-Evaluation 09/25/15   PT Start Time 1103   PT Stop Time 1145   PT Time Calculation (min) 42 min   Activity Tolerance Patient tolerated treatment well   Behavior During Therapy [facility] for tasks assessed/performed      Past Medical History  Diagnosis Date  . Multiple sclerosis ([place])   . Other and unspecified hyperlipidemia   . Obesity, unspecified   . Allergy   . Arthritis   . Neuromuscular disorder ([place])   . Osteoporosis   . TIA (transient ischemic attack) 20  . Migraine   . Hypersomnolence   . Anxiety     Past Surgical History  Procedure Laterality Date  . Hernia repair    . Tubal ligation    . Wrist surgery    . Arm surgery left    . Abdominal hysterectomy      Fibroids    There were no vitals filed for this visit.      Subjective Assessment - 08/16/15 1103    Subjective Not really having any pain today. Numbness and tingling continue to be present on Left side.    Currently in Pain? Yes   Pain Score 2    Pain Location Neck   Pain Orientation Left   Pain Descriptors / Indicators Aching   Pain Radiating Towards LUE   Pain Score 4   Pain Location Back   Pain Orientation Lower   Pain Descriptors / Indicators Aching                         [facility] Adult PT Treatment/Exercise - 08/16/15 0001    Exercises   Exercises Shoulder   Neck Exercises: Supine   Neck Retraction 10 reps;5 secs   Shoulder ABduction Both;10 reps   Lumbar Exercises: Supine   Other Supine Lumbar Exercises double knee to chest stretch 5x10 sec   Other Supine Lumbar Exercises lower trunk rotation x20   Shoulder Exercises: Supine   ABduction --   Other Supine Exercises supine resisted retraction 10x5 sec   Shoulder Exercises: Seated   Retraction 20 reps   Shoulder Exercises: Sidelying   External Rotation 20 reps   ABduction 10 reps   ABduction Limitations with end range stretch   Manual Therapy   Joint Mobilization cervical L gapping with R SB, t1-T7 grade 2 P>A mobs, L lumbar gapping                PT Education - 08/16/15 1150    Education provided Yes   Education Details importance of relaxing upper traps while on computer. posture and lumbar support   Person(s) Educated Patient   Methods Explanation;Demonstration;Tactile cues;Verbal cues   Comprehension Verbalized understanding;Returned demonstration;Verbal cues required;Tactile cues required             PT Long Term Goals - 08/14/15 0944    PT LONG TERM GOAL #1   Title Pt will report at least 25% improvement in LUE numbness and tingling.  (08-17-15)   Time 4   Period Weeks  Status On-going   PT LONG TERM GOAL #2   Title Increase R cervical rotation to at least 45 degrees and R lateral flexion to at least 40 degrees to increase ease with head movements during environmental scanning and driving.  ([id number])   Time 4   Period Weeks   Status On-going   PT LONG TERM GOAL #3   Title Independent in HEP for cervical ROM and LUE strengthening.  (08-17-15)   Time 4   Period Weeks   Status On-going   PT LONG TERM GOAL #4   Title Pt will be able to do UE/LE and core strengthening at the gym and report no increase in pain    Time 4   Period Weeks   Status On-going   PT LONG TERM GOAL #5   Title Pt will be I with concepts of osteoporosis and posture to prevent reinjury   Time 4   Period Weeks   Status On-going               Plan - 08/16/15 1151    Clinical Impression Statement Patient reacted positively to needling and taping, reporting improved  pain in neck. LBP continues to be present as well as numbness and tingling. Patient reports she is very active and has a har time relaxing, especially when working at her computer.  Patient reported decrease in numbness with increase in tingling during gapping and prone mobilizations, esp at T3,  manual techniques.    Rehab Potential Good      Patient will benefit from skilled therapeutic intervention in order to improve the following deficits and impairments:  Postural dysfunction, Pain, Decreased range of motion, Decreased strength, Impaired sensation, Impaired flexibility, Improper body mechanics  Visit Diagnosis: Cervicalgia  Muscle weakness (generalized)  Paresthesia of skin  Neck pain     Problem List Patient Active Problem List   Diagnosis Date Noted  . Degenerative cervical disc 10/04/2014  . [place] arthritis 08/19/2014  . Ankle sprain 04/14/2014  . Low back pain 11/26/2013  . Numbness and tingling in left arm 07/09/2013  . Shoulder bursitis 06/18/2013  . Anxiety state, unspecified 12/30/2012  . Other malaise and fatigue 12/30/2012  . Osteoporosis 11/13/2012  . Osteoarthritis of multiple joints 04/18/2011  . Incontinence of urine 04/18/2011  . HYPERLIPIDEMIA-MIXED 01/08/2010  . MULTIPLE SCLEROSIS 01/08/2010  . DYSPNEA 01/05/2010    Jesua Tamblyn C. Etrulia Zarr PT, DPT 08/16/2015 12:53 PM   [facility] [address] [place], [place], [address] Phone: [phone]   Fax:  [phone]  Name: CANDIAS TACKITT MRN: [id number] Date of Birth: 24-Aug-1950

## 2015-08-21 ENCOUNTER — Ambulatory Visit: Payer: 59 | Admitting: Physical Therapy

## 2015-08-21 DIAGNOSIS — M6281 Muscle weakness (generalized): Secondary | ICD-10-CM | POA: Diagnosis not present

## 2015-08-21 DIAGNOSIS — M542 Cervicalgia: Secondary | ICD-10-CM | POA: Diagnosis not present

## 2015-08-21 DIAGNOSIS — R202 Paresthesia of skin: Secondary | ICD-10-CM | POA: Diagnosis not present

## 2015-08-21 NOTE — Therapy (Signed)
[place], [place], [address] Phone: [phone]   Fax:  [phone]  Physical Therapy Treatment  Patient Details  Name: Katie Maldonado MRN: [id number] Date of Birth: 12-24-1950 Referring Provider: Dr. Andrey Spearman  Encounter Date: 08/21/2015      PT End of Session - 08/21/15 1105    Visit Number 9   Number of Visits 18   Date for PT Re-Evaluation 09/25/15   PT Start Time 1102   PT Stop Time 1200   PT Time Calculation (min) 58 min      Past Medical History  Diagnosis Date  . Multiple sclerosis ([place])   . Other and unspecified hyperlipidemia   . Obesity, unspecified   . Allergy   . Arthritis   . Neuromuscular disorder ([place])   . Osteoporosis   . TIA (transient ischemic attack) 20  . Migraine   . Hypersomnolence   . Anxiety     Past Surgical History  Procedure Laterality Date  . Hernia repair    . Tubal ligation    . Wrist surgery    . Arm surgery left    . Abdominal hysterectomy      Fibroids    There were no vitals filed for this visit.      Subjective Assessment - 08/21/15 1104    Subjective Numbness and tingling left neck to left knee, all down left side. My pain is not like it was. 3/10.    Currently in Pain? Yes   Pain Score 3    Pain Location Neck  and Shoulder   Pain Orientation Posterior   Pain Descriptors / Indicators Sore;Aching   Aggravating Factors  good posture, quick turns   Pain Relieving Factors heat , slouching   Pain Score 3   Pain Location Back            [facility] PT Assessment - 08/21/15 0001    AROM   Cervical Flexion 50   Cervical Extension 32  increased in N/T   Cervical - Right Side Bend 25   Cervical - Left Side Bend 25   Cervical - Right Rotation 45   Cervical - Left Rotation 48                     [facility] Adult PT Treatment/Exercise - 08/21/15 0001    Neck Exercises: Supine   Neck Retraction 10 reps;5 secs   Neck  Retraction Limitations pillow and towl under head to promote decrease tingling   Other Supine Exercise horizontal abduction with yellow band 10 x2, diagonals 10 x 2 ER 10x2   Shoulder Exercises: Standing   Extension 15 reps;Theraband   Theraband Level (Shoulder Extension) Level 2 (Red)   Extension Limitations cues for posture, neutral spine   Row Both;15 reps;Theraband  1 set yellow. 1 set red   Theraband Level (Shoulder Row) Level 1 (Yellow);Level 2 (Red)   Row Limitations cues for posture, neutral spine   Other Standing Exercises on Wall against foam roller, towel added behind head to decrease N/T sx in arm, horizontal abduction, ER 10 x 2, diagonals 10 x 2 each    Shoulder Exercises: Isometric Strengthening   Flexion 5X5"  Per HEP   Extension 5X5"  per HEP   Moist Heat Therapy   Number Minutes Moist Heat 10 Minutes   Moist Heat Location Cervical;Shoulder  PT Long Term Goals - 08/21/15 1114    PT LONG TERM GOAL #1   Title Pt will report at least 25% improvement in LUE numbness and tingling.  (08-17-15)   Time 4   Period Weeks   Status On-going   PT LONG TERM GOAL #2   Title Increase R cervical rotation to at least 45 degrees and R lateral flexion to at least 40 degrees to increase ease with head movements during environmental scanning and driving.  (0-53-97)   Baseline 45, 48 degrees rotation   Time 4   Period Weeks   Status Partially Met   PT LONG TERM GOAL #3   Title Independent in HEP for cervical ROM and LUE strengthening.  (08-17-15)   Time 4   Period Weeks   Status On-going   PT LONG TERM GOAL #4   Title Pt will be able to do UE/LE and core strengthening at the gym and report no increase in pain    Time 4   Period Weeks   Status On-going   PT LONG TERM GOAL #5   Title Pt will be I with concepts of osteoporosis and posture to prevent reinjury   Time 4   Period Weeks   Status Partially Met               Plan - 08/21/15  1134    Clinical Impression Statement Pt came at wrong appointment time and worked into available schedule. Pt reports less pain after TPDN, No overall decrease in N/T. Focused cervical and scapular stabilization exercises in supine and standing. Cervical rotation improved. LTG# 2 partially Met.  Pt understands importance of posture as it relates to osteoporosis, will benefit from ADL training with osteoporosis precautions. LTG#5 partially Met.    PT Next Visit Plan cervical/scapular stabilization, lumbar, manual and modalities for pain.  Give osteoporosis handout and review precautions. FOTO      Patient will benefit from skilled therapeutic intervention in order to improve the following deficits and impairments:  Postural dysfunction, Pain, Decreased range of motion, Decreased strength, Impaired sensation, Impaired flexibility, Improper body mechanics  Visit Diagnosis: Cervicalgia  Muscle weakness (generalized)  Paresthesia of skin     Problem List Patient Active Problem List   Diagnosis Date Noted  . Degenerative cervical disc 10/04/2014  . [place] arthritis 08/19/2014  . Ankle sprain 04/14/2014  . Low back pain 11/26/2013  . Numbness and tingling in left arm 07/09/2013  . Shoulder bursitis 06/18/2013  . Anxiety state, unspecified 12/30/2012  . Other malaise and fatigue 12/30/2012  . Osteoporosis 11/13/2012  . Osteoarthritis of multiple joints 04/18/2011  . Incontinence of urine 04/18/2011  . HYPERLIPIDEMIA-MIXED 01/08/2010  . MULTIPLE SCLEROSIS 01/08/2010  . DYSPNEA 01/05/2010    Dorene Ar, PTA 08/21/2015, 11:51 AM  [facility] [address] [place], [place], [address] Phone: [phone]   Fax:  [phone]  Name: Katie Maldonado MRN: [id number] Date of Birth: Aug 27, 1950

## 2015-08-23 ENCOUNTER — Ambulatory Visit: Payer: 59 | Admitting: Physical Therapy

## 2015-08-23 DIAGNOSIS — M6281 Muscle weakness (generalized): Secondary | ICD-10-CM | POA: Diagnosis not present

## 2015-08-23 DIAGNOSIS — R202 Paresthesia of skin: Secondary | ICD-10-CM | POA: Diagnosis not present

## 2015-08-23 DIAGNOSIS — M542 Cervicalgia: Secondary | ICD-10-CM

## 2015-08-23 NOTE — Therapy (Signed)
[place], [place], [address] Phone: [phone]   Fax:  [phone]  Physical Therapy Treatment  Patient Details  Name: Katie Maldonado MRN: [id number] Date of Birth: 28-May-1950 Referring Provider: Dr. Andrey Spearman  Encounter Date: 08/23/2015      PT End of Session - 08/23/15 1304    Visit Number 10   Number of Visits 18   Date for PT Re-Evaluation 09/25/15   PT Start Time 1018   PT Stop Time 1108   PT Time Calculation (min) 50 min   Activity Tolerance Patient tolerated treatment well   Behavior During Therapy [facility] for tasks assessed/performed      Past Medical History  Diagnosis Date  . Multiple sclerosis ([place])   . Other and unspecified hyperlipidemia   . Obesity, unspecified   . Allergy   . Arthritis   . Neuromuscular disorder ([place])   . Osteoporosis   . TIA (transient ischemic attack) 20  . Migraine   . Hypersomnolence   . Anxiety     Past Surgical History  Procedure Laterality Date  . Hernia repair    . Tubal ligation    . Wrist surgery    . Arm surgery left    . Abdominal hysterectomy      Fibroids    There were no vitals filed for this visit.      Subjective Assessment - 08/23/15 1019    Subjective I think the numbness is worse today, went to Yoga last night.    Currently in Pain? Yes   Pain Score 3            [facility] Adult PT Treatment/Exercise - 08/23/15 1021    Neck Exercises: Supine   Neck Retraction 10 reps   Neck Retraction Limitations foam roller   Shoulder Flexion Both;10 reps;Other (comment)   Shoulder Flexion Weights (lbs) stabilization    Shoulder ABduction Both;10 reps  horiz abd   Shoulder Abduction Weights (lbs) stabilization    Upper Extremity D1 10 reps;Theraband   Theraband Level (UE D1) Level 2 (Red)   Other Supine Exercise scap retract on foam    Other Supine Exercise dying bug on foam    Lumbar Exercises: Supine   Clam 10 reps    Clam Limitations on foam    Bent Knee Raise 10 reps   Bent Knee Raise Limitations foam    Shoulder Exercises: Supine   Horizontal ABduction Strengthening;Both;10 reps   Theraband Level (Shoulder Horizontal ABduction) Level 2 (Red)   External Rotation Strengthening;Both;10 reps;Theraband   Theraband Level (Shoulder External Rotation) Level 2 (Red)   Moist Heat Therapy   Moist Heat Location Cervical;Shoulder   Manual Therapy   Soft tissue mobilization suboccipitals    Passive ROM rotation and lateral flexion    Manual Traction gentle 30 sec x 5 sec hold   Neck Exercises: Stretches   Neck Stretch 3 reps;30 seconds                     PT Long Term Goals - 08/21/15 1114    PT LONG TERM GOAL #1   Title Pt will report at least 25% improvement in LUE numbness and tingling.  (08-17-15)   Time 4   Period Weeks   Status On-going   PT LONG TERM GOAL #2   Title Increase R cervical rotation to at least 45 degrees and R lateral flexion to at least 40 degrees to increase ease with  head movements during environmental scanning and driving.  (0-04-47)   Baseline 45, 48 degrees rotation   Time 4   Period Weeks   Status Partially Met   PT LONG TERM GOAL #3   Title Independent in HEP for cervical ROM and LUE strengthening.  (08-17-15)   Time 4   Period Weeks   Status On-going   PT LONG TERM GOAL #4   Title Pt will be able to do UE/LE and core strengthening at the gym and report no increase in pain    Time 4   Period Weeks   Status On-going   PT LONG TERM GOAL #5   Title Pt will be I with concepts of osteoporosis and posture to prevent reinjury   Time 4   Period Weeks   Status Partially Met               Plan - 08/23/15 1307    Clinical Impression Statement Patient educated on safety in community yoga class, importance of using modifications if pain or if a possibility of pain, avoiding spine flexion. Noted weakness in core today with foam roller exercises, needs cues to  breathe In general she cannot identify things that aggravate or alleviate her numbness (Lt side of the body)  but she does say that manual traction relieves it a bit.temporarily.     PT Next Visit Plan cervical/scapular stabilization, lumbar, manual and modalities for pain.  Give osteoporosis handout and review precautions.    PT Home Exercise Plan sub-occipital release with tennis balls   Consulted and Agree with Plan of Care Patient      Patient will benefit from skilled therapeutic intervention in order to improve the following deficits and impairments:  Postural dysfunction, Pain, Decreased range of motion, Decreased strength, Impaired sensation, Impaired flexibility, Improper body mechanics  Visit Diagnosis: Cervicalgia  Muscle weakness (generalized)  Paresthesia of skin     Problem List Patient Active Problem List   Diagnosis Date Noted  . Degenerative cervical disc 10/04/2014  . [place] arthritis 08/19/2014  . Ankle sprain 04/14/2014  . Low back pain 11/26/2013  . Numbness and tingling in left arm 07/09/2013  . Shoulder bursitis 06/18/2013  . Anxiety state, unspecified 12/30/2012  . Other malaise and fatigue 12/30/2012  . Osteoporosis 11/13/2012  . Osteoarthritis of multiple joints 04/18/2011  . Incontinence of urine 04/18/2011  . HYPERLIPIDEMIA-MIXED 01/08/2010  . MULTIPLE SCLEROSIS 01/08/2010  . DYSPNEA 01/05/2010    PAA,JENNIFER 08/23/2015, 1:21 PM  [facility] [address] [place], [place], [address] Phone: [phone]   Fax:  [phone]  Name: Katie Maldonado MRN: [id number] Date of Birth: February 12, 1951    Raeford Razor, PT 08/23/2015 1:21 PM Phone: [phone] Fax: [phone]

## 2015-08-28 ENCOUNTER — Ambulatory Visit (INDEPENDENT_AMBULATORY_CARE_PROVIDER_SITE_OTHER): Payer: 59 | Admitting: Physician Assistant

## 2015-08-28 ENCOUNTER — Ambulatory Visit: Payer: 59 | Attending: Diagnostic Neuroimaging | Admitting: Physical Therapy

## 2015-08-28 VITALS — BP 128/70 | HR 62 | Temp 98.1°F | Resp 17 | Ht 64.0 in | Wt 160.0 lb

## 2015-08-28 DIAGNOSIS — M542 Cervicalgia: Secondary | ICD-10-CM | POA: Insufficient documentation

## 2015-08-28 DIAGNOSIS — M6281 Muscle weakness (generalized): Secondary | ICD-10-CM | POA: Insufficient documentation

## 2015-08-28 DIAGNOSIS — L03011 Cellulitis of right finger: Secondary | ICD-10-CM

## 2015-08-28 DIAGNOSIS — Z23 Encounter for immunization: Secondary | ICD-10-CM | POA: Diagnosis not present

## 2015-08-28 DIAGNOSIS — R202 Paresthesia of skin: Secondary | ICD-10-CM | POA: Insufficient documentation

## 2015-08-28 MED ORDER — AMOXICILLIN-POT CLAVULANATE 875-125 MG PO TABS: 1.0000 | ORAL_TABLET | Freq: Two times a day (BID) | ORAL | Status: DC

## 2015-08-28 MED FILL — ALENDRONATE NA 70 MG TAB: 70 | 12 days supply | Qty: 12 | Fill #2

## 2015-08-28 NOTE — Progress Notes (Signed)
08/30/2015 10:52 AM   DOB: [birth year] / MRN: [id number]  SUBJECTIVE:  Katie Maldonado is a 65 y.o. female presenting for a painful and swollen and painful right 3rd finger.  Reports she had a blister which she unroofed and later the finger began to throb and become very sensitive to touch.  She denies fever, chills, nausea, and changes in strength, dexterity, and sensation of the digit.   Immunization History  Administered Date(s) Administered  . Hepatitis B 12/11/2007  . Influenza Split 01/31/2011, 04/28/2012, 02/02/2014  . MMR 05/16/1995, 12/10/2007  . Pneumococcal Conjugate-13 04/12/2015  . Pneumococcal Polysaccharide-23 09/16/2011  . Tdap 08/28/2015  . Zoster 11/17/2012     She is allergic to nitrofurantoin; sulfonamide derivatives; cefdinir; and copaxone.   She  has a past medical history of Multiple sclerosis ([place]); Other and unspecified hyperlipidemia; Obesity, unspecified; Allergy; Arthritis; Neuromuscular disorder ([place]); Osteoporosis; TIA (transient ischemic attack) (20); Migraine; Hypersomnolence; and Anxiety.    She  reports that she quit smoking about 11 years ago. Her smoking use included Cigarettes. She quit after 37 years of use. She has never used smokeless tobacco. She reports that she drinks alcohol. She reports that she does not use illicit drugs. She  reports that she currently engages in sexual activity. She reports using the following method of birth control/protection: None. The patient  has past surgical history that includes Hernia repair; Tubal ligation; Wrist surgery; arm surgery left; and Abdominal hysterectomy.  Her family history includes Cancer in her brother, maternal grandfather, maternal grandmother, paternal grandfather, and paternal grandmother; Emphysema in her father; Heart attack in her brother; Heart disease in her brother.  Review of Systems  Constitutional: Negative for malaise/fatigue and diaphoresis.  Skin: Positive for rash. Negative for  itching.  Neurological: Negative for dizziness and headaches.    Problem list and medications reviewed and updated by myself where necessary, and exist elsewhere in the encounter.   OBJECTIVE:  BP 128/70 mmHg  Pulse 62  Temp(Src) 98.1 F (36.7 C) (Oral)  Resp 17  Ht 5' 4"  (1.626 m)  Wt 160 lb (72.576 kg)  BMI 27.45 kg/m2  SpO2 99%  Physical Exam  Constitutional: She is oriented to person, place, and time. She appears well-nourished. No distress.  Eyes: EOM are normal. Pupils are equal, round, and reactive to light.  Cardiovascular: Normal rate.   Pulmonary/Chest: Effort normal.  Abdominal: She exhibits no distension.  Musculoskeletal:       Hands: Neurological: She is alert and oriented to person, place, and time. No cranial nerve deficit. Gait normal.  Skin: Skin is dry. She is not diaphoretic.  Psychiatric: She has a normal mood and affect.  Vitals reviewed.   No results found for this or any previous visit (from the past 72 hour(s)).  No results found.  ASSESSMENT AND PLAN  [name] was seen today for finger injury.  Diagnoses and all orders for this visit:  Cellulitis of finger of right hand: RTC in 48 hours for recheck, sooner if worse.  -     Tdap vaccine greater than or equal to 7yo IM -     amoxicillin-clavulanate (AUGMENTIN) 875-125 MG tablet; Take 1 tablet by mouth 2 (two) times daily.  Other orders -     Discontinue: amoxicillin-clavulanate (AUGMENTIN) 875-125 MG tablet; Take 1 tablet by mouth 2 (two) times daily.    The patient was advised to call or return to clinic if she does not see an improvement in symptoms or to seek the  care of the closest emergency department if she worsens with the above plan.   Philis Fendt, MHS, PA-C [facility] 08/30/2015 10:52 AM

## 2015-08-28 NOTE — Patient Instructions (Addendum)
Return for a recheck in 48 hours, sooner if you are worse (i.e. Fever, worsening redness, chills, nausea).     IF you received an x-ray today, you will receive an invoice from [facility]. Please contact [facility] at [phone] with questions or concerns regarding your invoice.   IF you received labwork today, you will receive an invoice from [employer]. Please contact [facility] at [phone] with questions or concerns regarding your invoice.   Our billing staff will not be able to assist you with questions regarding bills from these companies.  You will be contacted with the lab results as soon as they are available. The fastest way to get your results is to activate your My Chart account. Instructions are located on the last page of this paperwork. If you have not heard from [place] regarding the results in 2 weeks, please contact this office.

## 2015-08-28 NOTE — Therapy (Signed)
[place], [place], [address] Phone: [phone]   Fax:  [phone]  Physical Therapy Treatment  Patient Details  Name: Katie Maldonado MRN: [id number] Date of Birth: [birth year] Referring Provider: Dr. Andrey Spearman  Encounter Date: 08/28/2015      PT End of Session - 08/28/15 0931    Visit Number 11   Number of Visits 18   Date for PT Re-Evaluation 09/25/15   Authorization Type [facility]   PT Start Time 0845   PT Stop Time 0940   PT Time Calculation (min) 55 min   Activity Tolerance Patient tolerated treatment well   Behavior During Therapy [facility] for tasks assessed/performed      Past Medical History  Diagnosis Date  . Multiple sclerosis ([place])   . Other and unspecified hyperlipidemia   . Obesity, unspecified   . Allergy   . Arthritis   . Neuromuscular disorder ([place])   . Osteoporosis   . TIA (transient ischemic attack) 20  . Migraine   . Hypersomnolence   . Anxiety     Past Surgical History  Procedure Laterality Date  . Hernia repair    . Tubal ligation    . Wrist surgery    . Arm surgery left    . Abdominal hysterectomy      Fibroids    There were no vitals filed for this visit.      Subjective Assessment - 08/28/15 0844    Subjective "I am feeling very tight across the shoulders and the base of my neck, feels like concrete"   Currently in Pain? Yes   Pain Score 3    Pain Location Neck   Pain Orientation Posterior;Left   Pain Descriptors / Indicators Sore;Aching   Pain Type Chronic pain   Pain Radiating Towards LUE   Pain Onset More than a month ago   Pain Frequency Constant   Aggravating Factors  unknown,    Pain Relieving Factors slouching down, heat   Pain Score 5   Pain Location Back   Pain Orientation Lower   Pain Descriptors / Indicators Aching   Pain Type Chronic pain   Pain Onset More than a month ago   Pain Frequency Constant   Aggravating Factors   sitting   Pain Relieving Factors standing, heat,                          [facility] Adult PT Treatment/Exercise - 08/28/15 0929    Neck Exercises: Seated   Neck Retraction 10 reps;10 secs  with towel beneath top  of head to facilitate chin tuck   Shoulder Exercises: Seated   Retraction 20 reps   Moist Heat Therapy   Number Minutes Moist Heat 10 Minutes   Moist Heat Location Cervical;Shoulder   Manual Therapy   Manual Therapy Neural Stretch   Joint Mobilization cervical L gapping with R SB, t1-T7 grade 2 P>A mobs, L lumbar gapping   Soft tissue mobilization DTM over bil upper traps   Manual Traction gentle 30 sec x 5 sec hold   Neural Stretch median nerve glide on L    Neck Exercises: Stretches   Neck Stretch 2 reps;30 seconds  contract/ relax with 10 sec hold performed bil          Trigger Point Dry Needling - 08/28/15 0931    Consent Given? Yes   Education Handout Provided No  given prevously   Muscles  Treated Upper Body Upper trapezius  bil   Upper Trapezius Response Twitch reponse elicited;Palpable increased muscle length              PT Education - 08/28/15 0930    Education provided Yes   Education Details posture keeping shoulders down throughout activity, osteoporosis packet handout   Person(s) Educated Patient   Methods Explanation   Comprehension Verbalized understanding             PT Long Term Goals - 08/21/15 1114    PT LONG TERM GOAL #1   Title Pt will report at least 25% improvement in LUE numbness and tingling.  (08-17-15)   Time 4   Period Weeks   Status On-going   PT LONG TERM GOAL #2   Title Increase R cervical rotation to at least 45 degrees and R lateral flexion to at least 40 degrees to increase ease with head movements during environmental scanning and driving.  (12-27-49)   Baseline 45, 48 degrees rotation   Time 4   Period Weeks   Status Partially Met   PT LONG TERM GOAL #3   Title Independent in HEP for cervical  ROM and LUE strengthening.  (08-17-15)   Time 4   Period Weeks   Status On-going   PT LONG TERM GOAL #4   Title Pt will be able to do UE/LE and core strengthening at the gym and report no increase in pain    Time 4   Period Weeks   Status On-going   PT LONG TERM GOAL #5   Title Pt will be I with concepts of osteoporosis and posture to prevent reinjury   Time 4   Period Weeks   Status Partially Met               Plan - 08/28/15 0934    Clinical Impression Statement Mrs. Hancox reports she is feeling extremely tight today across the shoulders and the base of her neck. performed DN and DTM which she reported relief in the Upper traps bilaterally. Following manual traction and Median nerve glides she reported relief of tingling. provided packet on Osteoporosis exercises and posture. utilized MHP post session to decease soreness post session which she reported helped to reduce pain and tightness.    PT Next Visit Plan assess response to DN, and nerve flossing, cervical/scapular stabilization, lumbar, manual and modalities for pain.     PT Home Exercise Plan osteoporosis packet handout   Consulted and Agree with Plan of Care Patient      Patient will benefit from skilled therapeutic intervention in order to improve the following deficits and impairments:  Postural dysfunction, Pain, Decreased range of motion, Decreased strength, Impaired sensation, Impaired flexibility, Improper body mechanics  Visit Diagnosis: Cervicalgia  Muscle weakness (generalized)  Paresthesia of skin     Problem List Patient Active Problem List   Diagnosis Date Noted  . Degenerative cervical disc 10/04/2014  . [place] arthritis 08/19/2014  . Ankle sprain 04/14/2014  . Low back pain 11/26/2013  . Numbness and tingling in left arm 07/09/2013  . Shoulder bursitis 06/18/2013  . Anxiety state, unspecified 12/30/2012  . Other malaise and fatigue 12/30/2012  . Osteoporosis 11/13/2012  . Osteoarthritis  of multiple joints 04/18/2011  . Incontinence of urine 04/18/2011  . HYPERLIPIDEMIA-MIXED 01/08/2010  . MULTIPLE SCLEROSIS 01/08/2010  . DYSPNEA 01/05/2010   Starr [place] PT, DPT, LAT, ATC  08/28/2015  9:43 AM      [facility]  [place], [place], [address] Phone: [phone]   Fax:  [phone]  Name: Katie Maldonado MRN: [id number] Date of Birth: 12-Sep-1950

## 2015-08-29 ENCOUNTER — Encounter: Payer: Self-pay | Admitting: Physician Assistant

## 2015-08-29 MED FILL — MYRBETRIQ ER 50 MG TABLET: 50 | 30 days supply | Qty: 30 | Fill #0

## 2015-08-30 ENCOUNTER — Ambulatory Visit: Payer: 59 | Admitting: Physical Therapy

## 2015-08-30 ENCOUNTER — Ambulatory Visit (INDEPENDENT_AMBULATORY_CARE_PROVIDER_SITE_OTHER): Payer: 59 | Admitting: Physician Assistant

## 2015-08-30 VITALS — BP 122/80 | HR 70 | Temp 98.2°F | Resp 18 | Ht 64.0 in | Wt 160.0 lb

## 2015-08-30 DIAGNOSIS — M542 Cervicalgia: Secondary | ICD-10-CM

## 2015-08-30 DIAGNOSIS — R202 Paresthesia of skin: Secondary | ICD-10-CM

## 2015-08-30 DIAGNOSIS — M6281 Muscle weakness (generalized): Secondary | ICD-10-CM | POA: Diagnosis not present

## 2015-08-30 DIAGNOSIS — L03011 Cellulitis of right finger: Secondary | ICD-10-CM

## 2015-08-30 NOTE — Progress Notes (Signed)
   08/30/2015 6:09 PM   DOB: 1950-05-19 / MRN: [id number]  SUBJECTIVE:  Katie Maldonado is a 65 y.o. female presenting for a recheck of cellulitis.  She reports she feels much better and is now pain free from the right middle DIP proximally.  She has been taking Augmentin without a problem.    She is allergic to nitrofurantoin; sulfonamide derivatives; cefdinir; and copaxone.   She  has a past medical history of Multiple sclerosis ([place]); Other and unspecified hyperlipidemia; Obesity, unspecified; Allergy; Arthritis; Neuromuscular disorder ([place]); Osteoporosis; TIA (transient ischemic attack) (20); Migraine; Hypersomnolence; and Anxiety.    She  reports that she quit smoking about 11 years ago. Her smoking use included Cigarettes. She quit after 37 years of use. She has never used smokeless tobacco. She reports that she drinks alcohol. She reports that she does not use illicit drugs. She  reports that she currently engages in sexual activity. She reports using the following method of birth control/protection: None. The patient  has past surgical history that includes Hernia repair; Tubal ligation; Wrist surgery; arm surgery left; and Abdominal hysterectomy.  Her family history includes Cancer in her brother, maternal grandfather, maternal grandmother, paternal grandfather, and paternal grandmother; Emphysema in her father; Heart attack in her brother; Heart disease in her brother.  Review of Systems  Constitutional: Negative for fever.  Gastrointestinal: Negative for nausea.  Neurological: Negative for dizziness.    Problem list and medications reviewed and updated by myself where necessary, and exist elsewhere in the encounter.   OBJECTIVE:  BP 122/80 mmHg  Pulse 70  Temp(Src) 98.2 F (36.8 C) (Oral)  Resp 18  Ht 5\' 4"  (1.626 m)  Wt 160 lb (72.576 kg)  BMI 27.45 kg/m2  SpO2 98%  Physical Exam  Constitutional: She is oriented to person, place, and time. She appears well-nourished.  No distress.  Eyes: EOM are normal. Pupils are equal, round, and reactive to light.  Cardiovascular: Normal rate.   Pulmonary/Chest: Effort normal.  Abdominal: She exhibits no distension.  Musculoskeletal:       Hands: Neurological: She is alert and oriented to person, place, and time. No cranial nerve deficit. Gait normal.  Skin: Skin is dry. She is not diaphoretic.  Psychiatric: She has a normal mood and affect.  Vitals reviewed.   No results found for this or any previous visit (from the past 72 hour(s)).  No results found.  ASSESSMENT AND PLAN  Carola was seen today for follow-up.  Diagnoses and all orders for this visit:  Cellulitis of finger of right hand: Resolving.  Given Augmentin's effectiveness this is likely a strep etiology.  Advised she finish the course and RTC as needed.    The patient was advised to call or return to clinic if she does not see an improvement in symptoms or to seek the care of the closest emergency department if she worsens with the above plan.   Philis Fendt, MHS, PA-C [facility] 08/30/2015 6:09 PM

## 2015-08-30 NOTE — Patient Instructions (Signed)
     IF you received an x-ray today, you will receive an invoice from [facility]. Please contact [facility] at [phone] with questions or concerns regarding your invoice.   IF you received labwork today, you will receive an invoice from [facility]. Please contact [facility] at [phone] with questions or concerns regarding your invoice.   Our billing staff will not be able to assist you with questions regarding bills from these companies.  You will be contacted with the lab results as soon as they are available. The fastest way to get your results is to activate your My Chart account. Instructions are located on the last page of this paperwork. If you have not heard from us regarding the results in 2 weeks, please contact this office.      

## 2015-08-30 NOTE — Therapy (Addendum)
[place], [place], [address] Phone: [phone]   Fax:  [phone]  Physical Therapy Treatment  Patient Details  Name: Katie Maldonado MRN: [id number] Date of Birth: Sep 30, 1950 Referring Provider: Dr. Andrey Spearman  Encounter Date: 08/30/2015      PT End of Session - 08/30/15 1523    Visit Number 12   Number of Visits 18   Date for PT Re-Evaluation 09/25/15   PT Start Time 9163   PT Stop Time 1110   PT Time Calculation (min) 55 min   Activity Tolerance Patient tolerated treatment well   Behavior During Therapy [facility] for tasks assessed/performed      Past Medical History  Diagnosis Date  . Multiple sclerosis ([place])   . Other and unspecified hyperlipidemia   . Obesity, unspecified   . Allergy   . Arthritis   . Neuromuscular disorder ([place])   . Osteoporosis   . TIA (transient ischemic attack) 20  . Migraine   . Hypersomnolence   . Anxiety     Past Surgical History  Procedure Laterality Date  . Hernia repair    . Tubal ligation    . Wrist surgery    . Arm surgery left    . Abdominal hysterectomy      Fibroids    There were no vitals filed for this visit.      Subjective Assessment - 08/30/15 1018    Subjective I have bacterial infection in my finger.  They wanted to put me in the hospital.     Currently in Pain? Yes   Pain Score 5    Pain Location Neck   Pain Orientation Posterior;Right;Left   Pain Descriptors / Indicators Sore   Pain Type Chronic pain   Pain Onset More than a month ago   Pain Frequency Constant                 [facility] Adult PT Treatment/Exercise - 08/30/15 1024    Shoulder Exercises: Standing   Protraction AAROM;Both;5 reps   Horizontal ABduction Strengthening;Both;15 reps;Theraband   Theraband Level (Shoulder Horizontal ABduction) Level 1 (Yellow)   External Rotation Strengthening;Both;15 reps;Theraband   Theraband Level (Shoulder External  Rotation) Level 1 (Yellow)   Shoulder Elevation Limitations diagonal pull yellow x 10    Other Standing Exercises Standing wall isometric scap stabilization and postureal correction   Other Standing Exercises stand with foam along spine and UE strength    Moist Heat Therapy   Number Minutes Moist Heat 10 Minutes   Moist Heat Location Cervical   Manual Therapy   Soft tissue mobilization suboccipitals   upper traps and levator scap    Passive ROM rotation and lateral flexion    Manual Traction gentle 30 sec x 5 sec hold   Neck Exercises: Stretches   Upper Trapezius Stretch 2 reps;30 seconds   Levator Stretch 2 reps;30 seconds                PT Education - 08/30/15 1523    Education provided No             PT Long Term Goals - 08/30/15 1526    PT LONG TERM GOAL #1   Title Pt will report at least 25% improvement in LUE numbness and tingling.  (08-17-15)   Status On-going   PT LONG TERM GOAL #2   Title Increase R cervical rotation to at least 45 degrees and R lateral flexion to at least  40 degrees to increase ease with head movements during environmental scanning and driving.  (3-84-66)   Status Partially Met   PT LONG TERM GOAL #3   Title Independent in HEP for cervical ROM and LUE strengthening.  (08-17-15)   Status On-going   PT LONG TERM GOAL #4   Title Pt will be able to do UE/LE and core strengthening at the gym and report no increase in pain    Status On-going   PT LONG TERM GOAL #5   Title Pt will be I with concepts of osteoporosis and posture to prevent reinjury   Status Partially Met               Plan - 08/30/15 1523    Clinical Impression Statement Understands posture as it relates to osteoporosis.  Cont to have L sided body sensory disturbance.  She needs less cues for exercises, can demo and maintain corrected posture with min to mod difficulty.  Neck and upper back less painful overall, soreness.  Painful knots in upper back mm., able to tolerate  manual work now.    PT Next Visit Plan assess response to DN, and nerve flossing, cervical/scapular stabilization, lumbar, manual and modalities for pain.     PT Home Exercise Plan osteoporosis packet handout   Consulted and Agree with Plan of Care Patient      Patient will benefit from skilled therapeutic intervention in order to improve the following deficits and impairments:  Postural dysfunction, Pain, Decreased range of motion, Decreased strength, Impaired sensation, Impaired flexibility, Improper body mechanics  Visit Diagnosis: Cervicalgia  Muscle weakness (generalized)  Paresthesia of skin     Problem List Patient Active Problem List   Diagnosis Date Noted  . Degenerative cervical disc 10/04/2014  . [place] arthritis 08/19/2014  . Ankle sprain 04/14/2014  . Low back pain 11/26/2013  . Numbness and tingling in left arm 07/09/2013  . Shoulder bursitis 06/18/2013  . Anxiety state, unspecified 12/30/2012  . Other malaise and fatigue 12/30/2012  . Osteoporosis 11/13/2012  . Osteoarthritis of multiple joints 04/18/2011  . Incontinence of urine 04/18/2011  . HYPERLIPIDEMIA-MIXED 01/08/2010  . MULTIPLE SCLEROSIS 01/08/2010  . DYSPNEA 01/05/2010    Jyquan Kenley 08/30/2015, 3:29 PM  [facility] [address] [place], [place], [address] Phone: [phone]   Fax:  [phone]  Name: Katie Maldonado MRN: [id number] Date of Birth: [birth year]    Raeford Razor, PT 08/30/2015 3:29 PM Phone: [phone] Fax: [phone]   PHYSICAL THERAPY DISCHARGE SUMMARY  Visits from Start of Care: 12  Current functional level related to goals / functional outcomes: See above, not fully assessed due to did not return for follow up   Remaining deficits: As of this time, unknown for deficits in this episode.  In PT for new episode of low back pain.    Education / Equipment: Posture, HEP Plan: Patient agrees to discharge.   Patient goals were partially met. Patient is being discharged due to being pleased with the current functional level.  ?????  Raeford Razor, PT 04/03/16 1:58 PM Phone: [phone] Fax: [phone]

## 2015-08-31 ENCOUNTER — Encounter: Payer: Self-pay | Admitting: Diagnostic Neuroimaging

## 2015-08-31 ENCOUNTER — Ambulatory Visit (INDEPENDENT_AMBULATORY_CARE_PROVIDER_SITE_OTHER): Payer: 59 | Admitting: Diagnostic Neuroimaging

## 2015-08-31 VITALS — BP 104/71 | HR 66 | Ht 64.0 in | Wt 160.2 lb

## 2015-08-31 DIAGNOSIS — R208 Other disturbances of skin sensation: Secondary | ICD-10-CM

## 2015-08-31 DIAGNOSIS — M791 Myalgia, unspecified site: Secondary | ICD-10-CM

## 2015-08-31 DIAGNOSIS — R200 Anesthesia of skin: Secondary | ICD-10-CM

## 2015-08-31 DIAGNOSIS — R5383 Other fatigue: Secondary | ICD-10-CM

## 2015-08-31 DIAGNOSIS — G35 Multiple sclerosis: Secondary | ICD-10-CM | POA: Diagnosis not present

## 2015-08-31 NOTE — Progress Notes (Signed)
PATIENT: Katie Maldonado DOB: [birth year]   REASON FOR VISIT: follow up for MS HISTORY FROM: patient  Chief Complaint  Patient presents with  . Multiple Sclerosis    rm 7, Tecfidera, urinary issues better; fatigue is still bad; traction therapy x 10 sessions not very helpful"  . Follow-up     HISTORY OF PRESENT ILLNESS:  UPDATE 63/4/17: Since last visit continues with fatigue, pain, numbness. Tolerating tecfidera. Has tried PT. Urinary issues better. Now recovering from right arm celluitis (was painting, developed blister, then got infected; now better on oral antibiotics).  UPDATE 07/07/15: Since last visit, still with left neck pain radiating to the left arm. Numb/tingling. Has h/o left forearm/wrist fracture (2005 and 2010; s/p surgery). Amantadine not helping with fatigue. Denies depression.   UPDATE 05/31/15: Since last visit, sxs progressing since Oct 2016. More fatigue, more bladder/bowel issues. More left arm numbness.  UPDATE 11/28/14: Since last visit, continues to have prickly, tingling sensation in whole body, hot flash sensations, pain, neck issues. Had 2 falls, but no injuries.  UPDATE 08/23/14: Since last visit, has been on tecfidera since beginning March 2016. She has noted more general numbness, fatigue, constipation, drawstring sensation in calves/hamstrings in past 3 weeks.   UPDATE 05/18/14: MS symptoms stable. Has had 2 reactions to copaxone, leading to tinging, prickly sensations over whole body. 1 event led to throat tightness, tongue feeling thick, and worried patient. Here to discuss possibilities of switching therapies.  UPDATE 01/04/14 (VRP): Since last visit, doing the same. No new neuro symptoms. Does report persistent fatigue, weight gain, incr appetite, feeling hot. Tolerating copaxone. Misses 2-3 injections per month.   UPDATE 06/30/13 (LL):  Since last visit patient continues to have problems with fatigue and malaise, numbness and paresthesias in left arm  which is new for her, started a couple weeks ago.  She states it is constant, does not hurt, but annoying.  She is still able to work.  Follow up MRI brain at last visit showed no acute or new lesions, with moderate cerebral atrophy.  UPDATE 12/30/12 (VP): Since last visit patient continues to have problems with fatigue, insomnia, anxiety, shortness of breath. For past 6 months she's been having dyspnea on exertion especially climbing steps at her work. She has been evaluated by PCP without specific diagnosis. Patient continues on Copaxone (now 3 times per week dosing). Last MRI brain was in 2011. Regarding fatigue she has been on Provigil and Ritalin in the past without relief. Regarding anxiety she was on Paxil in the past but this caused nausea and stomach problems.   PRIOR HPI (05/02/12, Dr. 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 Her MRI of brain 06/10/1999 showed white matter abnormalities not typical for MS and MRI of the cervical spine without contrast 07/27/99 showed a focal area of abnormality in the spinal cord at C2-3. She had positive CSF IgG index and oligoclonal banding. Initially the VER was normal with subsequent VERs 12/04/1999, 12/10/1999, and 02/01/2000 abnormal in the right eye. She began Avonex which she took for one year and switched to Rebif beginning 08/2000. She was then switched to Betaseron 04/2001 because her insurance would not pay for Rebif, but was subsequently switched back to Rebif 04/2003. Because of elevated liver function tests she was switched to Copaxone 07/03/2005.She has felt better on Copaxone then on the interferons. Her visual acuity varies from 20/30 to  20/40 in the left eye and to 20/200 in the right. She had burning paresthesias treated with gabapentin and at times amitriptyline. NMO blood test was negative 02/21/2005. She has been followed Dr. Estanislado Pandy,  rheumatologist for the question of RA versus sarcoidosis and by Dr.Clinton Young for hypersomnolence without a diagnosis of narcolepsy or obstructive Sleep apnea. MRI studies of the brain and cervical spine 02/08/2010 showed periventricular lesions and unchanged lesion at C2-3. CBC and CMP 03/17/2009 were normal. CBC, CMP, TSH, lipid profile except LDL 151 were normal 08/07/09.vitamin D level 10/16/2009 was 41. She complains of fatigue and her right foot turning in an occasional foot cramping. She fell 10/2009 fracturing her right foot in a [employer] parking lot. In 2010 she fractured her left foot, left wrist, and her left arm. She has continued swelling in her right foot and lower leg. She has an overactive bladder followed by Dr.Dalhstedt. She has a history of Lhermitte's sign worse with neck extension but also present with flexion. She had numbness on the right side of her body except her face and head for one month.Her bladder symptoms improved on enablex.She walks 1.8 miles 3 times per week. 03/05/2011 she was lying in bed asleep and awoke trying to sit up and developed dizziness on 2 different occasions. She fell back in to the bed and went to sleep. On the third episode she became concerned. The episodes of dizziness would last seconds and are described as spinning. She had dizziness during the day without spinning She did not have head trauma or a cold. She was seen by Dr. Elder Cyphers at urgent care and underwent blood studies, EKG, and urinalysis. She has a history of migraine and a history of motion sickness. She has a Lhermitte's sign when she turns her head to the left with discomfort in her neck going into her left arm. This can occur 2 times per day and lasts Less than 1 minute. She has bladder symptoms and right flank pain and is concerned about another urinary tract infection. She can awaken with snoring when she has a cold. She sleeps well. She has dizziness 2 or 3 times per week when she lies down and turns  her head to the left.    REVIEW OF SYSTEMS: Full 14 system review of systems performed and negative except: fatigue constipation heat intolerance freq waking cramps neck pain bladder incont.    ALLERGIES: Allergies  Allergen Reactions  . Nitrofurantoin Shortness Of Breath    Shortness of breath, violent chills  . Sulfonamide Derivatives Shortness Of Breath    Shortness of breath, chills  . Cefdinir     Can't remember what rx had 2 yrs ago  . Copaxone [Glatiramer Acetate]     HOME MEDICATIONS: Outpatient Prescriptions Prior to Visit  Medication Sig Dispense Refill  . alendronate (FOSAMAX) 70 MG tablet Take 1 tablet (70 mg total) by mouth once a week. Take with a full glass of water on an empty stomach. 12 tablet 3  . amoxicillin-clavulanate (AUGMENTIN) 875-125 MG tablet Take 1 tablet by mouth 2 (two) times daily. 20 tablet 0  . aspirin EC 81 MG tablet Take 1 tablet (81 mg total) by mouth daily.    . cholecalciferol (VITAMIN D) 1000 UNITS tablet Take 1,000 Units by mouth daily. Reported on 08/08/2015    . citalopram (CELEXA) 20 MG tablet Take 1 tablet (20 mg total) by mouth daily. 90 tablet 1  . Dimethyl Fumarate (TECFIDERA) 240 MG CPDR Take 240 mg by  mouth 2 (two) times daily.    Marland Kitchen ibuprofen (ADVIL,MOTRIN) 200 MG tablet Take 2 tablets (400 mg total) by mouth 4 (four) times daily.    . mirabegron ER (MYRBETRIQ) 50 MG TB24 tablet Take 50 mg by mouth daily.    . Omega-3 Fatty Acids (FISH OIL) 1000 MG CAPS Take 1,000 mg by mouth 2 (two) times daily.   0  . omeprazole (PRILOSEC) 20 MG capsule TAKE 1 CAPSULE BY MOUTH ONCE DAILY 90 capsule 1  . ondansetron (ZOFRAN-ODT) 4 MG disintegrating tablet DISSOLVE 1 TABLET ON TONGUE EVERY 6 HOURS AS NEEDED 60 tablet 0  . TURMERIC PO Take 1 tablet by mouth daily.    Marland Kitchen gabapentin (NEURONTIN) 300 MG capsule Take 1 capsule (300 mg total) by mouth 2 (two) times daily. (Patient not taking: Reported on 08/31/2015) 60 capsule 12   Facility-Administered  Medications Prior to Visit  Medication Dose Route Frequency Provider Last Rate Last Dose  . gadopentetate dimeglumine (MAGNEVIST) injection 15 mL  15 mL Intravenous Once PRN Penni Bombard, MD      . gadopentetate dimeglumine (MAGNEVIST) injection 16 mL  16 mL Intravenous Once PRN Penni Bombard, MD       PHYSICAL EXAM  Filed Vitals:   08/31/15 1545  BP: 104/71  Pulse: 66  Height: 5\' 4"  (1.626 m)  Weight: 160 lb 3.2 oz (72.666 kg)   Body mass index is 27.48 kg/(m^2).  GENERAL EXAM/CONSTITUTIONAL: Vitals:  Filed Vitals:   08/31/15 1545  BP: 104/71  Pulse: 66  Height: 5\' 4"  (1.626 m)  Weight: 160 lb 3.2 oz (72.666 kg)    Patient is in no distress; well developed, nourished and groomed; neck is supple  CARDIOVASCULAR:  Examination of carotid arteries is normal; no carotid bruits  Regular rate and rhythm, no murmurs  Examination of peripheral vascular system by observation and palpation is normal  EYES:  Ophthalmoscopic exam of optic discs and posterior segments is normal; no papilledema or hemorrhages  MUSCULOSKELETAL:  Gait, strength, tone, movements noted in Neurologic exam below  NEUROLOGIC: MENTAL STATUS:   awake, alert, oriented to person, place and time  recent and remote memory intact  normal attention and concentration  language fluent, comprehension intact, naming intact,   fund of knowledge appropriate  CRANIAL NERVE:   2nd - no papilledema on fundoscopic exam  2nd, 3rd, 4th, 6th - pupils equal and reactive to light, visual fields full to confrontation, extraocular muscles intact, no nystagmus  5th - facial sensation symmetric  7th - facial strength symmetric  8th - hearing intact  9th - palate elevates symmetrically, uvula midline  11th - shoulder shrug symmetric  12th - tongue protrusion midline  MOTOR:   normal bulk and tone, full strength in the BUE, BLE   AT BASELINE LEFT SHOULDER LOWER THAN RIGHT  SENSORY:    normal and symmetric to light touch, temperature, vibration  COORDINATION:   finger-nose-finger, fine finger movements normal  REFLEXES:   deep tendon reflexes present and symmetric  GAIT/STATION:   narrow based gait; SLIGHT UNSTEADY TURNING; romberg is negative    DIAGNOSTIC DATA (LABS, IMAGING, TESTING) - I reviewed patient records, labs, notes, testing and imaging myself where available.  Lab Results  Component Value Date   WBC 4.7 02/21/2015   HGB 13.2 02/21/2015   HCT 39.1 02/21/2015   MCV 86.3 02/21/2015   PLT 246 02/21/2015   LYMPHOCYTES ABSOLUTE  Date Value Ref Range Status  08/23/2014 2.3 0.7 - 3.1 x10E3/uL  Final  05/18/2014 1.7 0.7 - 3.1 x10E3/uL Final   LYMPHS ABS  Date Value Ref Range Status  09/16/2011 1.8 0.7 - 4.0 K/uL Final  12/22/2010 0.7 0.7 - 4.0 K/uL Final  01/30/2010 1.5 0.7-4.0 K/uL Final   CMP Latest Ref Rng 02/21/2015 08/23/2014 05/18/2014  Glucose 65 - 99 mg/dL 77 84 91  BUN 7 - 25 mg/dL 15 14 16   Creatinine 0.50 - 0.99 mg/dL 0.67 0.84 0.90  Sodium 135 - 146 mmol/L 138 138 140  Potassium 3.5 - 5.3 mmol/L 3.8 5.7(H) 4.1  Chloride 98 - 110 mmol/L 99 98 102  CO2 20 - 31 mmol/L 27 24 27   Calcium 8.6 - 10.4 mg/dL 9.4 10.0 9.7  Total Protein 6.1 - 8.1 g/dL 7.2 7.6 7.0  Total Bilirubin 0.2 - 1.2 mg/dL 0.8 0.4 0.4  Alkaline Phos 33 - 130 U/L 70 77 86  AST 10 - 35 U/L 20 17 18   ALT 6 - 29 U/L 19 17 16    Lab Results  Component Value Date   TSH 1.425 02/21/2015    08/31/14 MRI brain - showing T2/flair hyperintense foci in the periventricular, deep and subcortical matter in a pattern and configuration consistent with the diagnosis of multiple sclerosis. No acute foci are noted. Generalized cortical atrophy is noted. When compared to a study dated 05/26/2014, there is no interval change.  08/31/14 MRI cervical spine -  1. Severe degenerative changes at C4-C5, C5-C6 and C6-C7 with there is disc protrusion, uncovertebral spurring and facet  hypertrophy. There is mild spinal stenosis at each of these 3 levels. There is also moderately severe foraminal narrowing which could lead to impingement of the exiting C5, C6 and C7 nerve roots. 2. Degenerative changes are milder at C2-C3, C3-C4 and C7-T1 and are less likely to lead to nerve root impingement.  3. The spinal cord appears normal.  4. There are no enhancing abnormalities.  5. Compared to the MRI of the cervical spine dated 07/07/2013, there is no definite interval change.   06/14/15 MRI brain  1. Mild periventricular and subcortical and infratentorial chronic demyelinating plaques.  2. No abnormal lesions are seen on post contrast views.  3. No change from MRI on 08/31/14.   06/28/15 MRI cervical  - severe degenerative changes at C4-5, C5-6 and C6-7 resulting in broad-based disc protrusions, uncovertebral spurring and facet hypertrophy resulting in mild canal and moderate bilateral foraminal narrowing with likely encroachment on the exiting C5, C6 and C7 nerve roots. No enhancing or demyelinating lesions are noted. Overall no significant change compared with previous MRI scan dated 08/31/2014.   ASSESSMENT AND PLAN  65 y.o. female here with multiple sclerosis since 2001, on copaxone since 2007. Previously on avonex and betaseron.  Then apparent intolerance to copaxone with 2 events of tingling, throat tightness, thick tongue with 2 injections. Now on tecfidera since March 2016. Some progression of symptoms since Oct 2016, but MRI scans are stable.  Left neck and arm symptoms likely related to cervical radiculopathies. Will try conservative treatment options.  Tried gabapentin for numbness --> stopped due to sleepiness.  Tried ritalin, provigil, amantadine for fatigue --> stopped due to in-effectiveness.    Dx:  Multiple sclerosis ([place])  Other fatigue  Muscle pain  Left arm numbness    PLAN: - continue tecfidera - continue gentle yoga classes - check  labs  Orders Placed This Encounter  Procedures  . CBC with Differential/Platelet  . Comprehensive metabolic panel  . Stratify JCV Antibody Test (  [facility])   Return in about 6 months (around 03/02/2016).    Penni Bombard, MD [id number], [id number] PM Certified in Neurology, Neurophysiology and Neuroimaging  [facility] [address], [place], [place] [address] [phone]

## 2015-08-31 NOTE — Patient Instructions (Addendum)
-   I will check labs today - check information on tysabri ([email or web address])

## 2015-09-01 LAB — CBC WITH DIFFERENTIAL/PLATELET
Basophils Absolute: 0 10*3/uL (ref 0.0–0.2)
Basos: 0 %
EOS (ABSOLUTE): 0.1 10*3/uL (ref 0.0–0.4)
EOS: 2 %
HEMATOCRIT: 37.3 % (ref 34.0–46.6)
Hemoglobin: 12.2 g/dL (ref 11.1–15.9)
IMMATURE GRANULOCYTES: 0 %
Immature Grans (Abs): 0 10*3/uL (ref 0.0–0.1)
LYMPHS ABS: 0.6 10*3/uL — AB (ref 0.7–3.1)
Lymphs: 14 %
MCH: 28 pg (ref 26.6–33.0)
MCHC: 32.7 g/dL (ref 31.5–35.7)
MCV: 86 fL (ref 79–97)
MONOS ABS: 0.5 10*3/uL (ref 0.1–0.9)
Monocytes: 11 %
Neutrophils Absolute: 3.3 10*3/uL (ref 1.4–7.0)
Neutrophils: 73 %
Platelets: 244 10*3/uL (ref 150–379)
RBC: 4.35 x10E6/uL (ref 3.77–5.28)
RDW: 14.6 % (ref 12.3–15.4)
WBC: 4.5 10*3/uL (ref 3.4–10.8)

## 2015-09-01 LAB — COMPREHENSIVE METABOLIC PANEL
A/G RATIO: 2.1 (ref 1.2–2.2)
ALT: 13 IU/L (ref 0–32)
AST: 15 IU/L (ref 0–40)
Albumin: 4.5 g/dL (ref 3.6–4.8)
Alkaline Phosphatase: 81 IU/L (ref 39–117)
BUN / CREAT RATIO: 21 (ref 12–28)
BUN: 15 mg/dL (ref 8–27)
Bilirubin Total: 0.4 mg/dL (ref 0.0–1.2)
CALCIUM: 9.1 mg/dL (ref 8.7–10.3)
CO2: 24 mmol/L (ref 18–29)
Chloride: 99 mmol/L (ref 96–106)
Creatinine, Ser: 0.71 mg/dL (ref 0.57–1.00)
GFR, EST AFRICAN AMERICAN: 105 mL/min/{1.73_m2} (ref >59)
GFR, EST NON AFRICAN AMERICAN: 91 mL/min/{1.73_m2} (ref >59)
GLOBULIN, TOTAL: 2.1 g/dL (ref 1.5–4.5)
Glucose: 83 mg/dL (ref 65–99)
POTASSIUM: 4.6 mmol/L (ref 3.5–5.2)
SODIUM: 139 mmol/L (ref 134–144)
Total Protein: 6.6 g/dL (ref 6.0–8.5)

## 2015-09-04 ENCOUNTER — Telehealth: Payer: Self-pay | Admitting: *Deleted

## 2015-09-04 NOTE — Telephone Encounter (Signed)
LVM requesting call back for lab results.  

## 2015-09-07 ENCOUNTER — Encounter: Payer: Self-pay | Admitting: Diagnostic Neuroimaging

## 2015-09-12 ENCOUNTER — Telehealth: Payer: Self-pay | Admitting: *Deleted

## 2015-09-12 NOTE — Telephone Encounter (Signed)
LVM informing patient, per Dr Leta Baptist, labs okay except low lymphocytes which may be expected. Advised that this RN got her My Chart message, and it will most likely benefit her most to be seen before Nov for follow up to discuss MS medications. Also informed her that JCV results are back and on Dr [employer] desk for review. Left name, number and requested she call back.Katie Maldonado

## 2015-09-12 NOTE — Telephone Encounter (Signed)
Patient is calling back in regard to your message.

## 2015-09-12 NOTE — Telephone Encounter (Signed)
Spoke to patient who stated she would like to speak with Dr Leta Baptist about possibly starting Tysabri after she goes to a program about the medication June 2nd. She stated she wonders if tecfidera may have caused her to have increasing issues, problems since Oct 2016. She inquired about what is JCV.  Gave her brief explanation of virus commonly found in environment . Informed her that after discussing her JCV results with Dr Leta Baptist will call her back. Advised she will most likely need FU sooner that Nov 2017. She verbalized understanding, appreciation.

## 2015-09-12 NOTE — Telephone Encounter (Signed)
JCV is positive 1.71. Continue tecfidera. -VRP

## 2015-09-13 ENCOUNTER — Telehealth: Payer: Self-pay | Admitting: Internal Medicine

## 2015-09-13 ENCOUNTER — Other Ambulatory Visit: Payer: Self-pay | Admitting: Radiology

## 2015-09-13 ENCOUNTER — Ambulatory Visit (HOSPITAL_BASED_OUTPATIENT_CLINIC_OR_DEPARTMENT_OTHER)
Admission: RE | Admit: 2015-09-13 | Discharge: 2015-09-13 | Disposition: A | Discharge status: Home / Self Care | Payer: 59 | Source: Ambulatory Visit | Attending: Internal Medicine | Admitting: Internal Medicine

## 2015-09-13 ENCOUNTER — Ambulatory Visit (INDEPENDENT_AMBULATORY_CARE_PROVIDER_SITE_OTHER): Payer: 59 | Admitting: Internal Medicine

## 2015-09-13 VITALS — BP 105/71 | HR 83 | Temp 98.9°F | Resp 16 | Ht 64.0 in | Wt 158.0 lb

## 2015-09-13 DIAGNOSIS — R1032 Left lower quadrant pain: Secondary | ICD-10-CM | POA: Diagnosis not present

## 2015-09-13 DIAGNOSIS — R1031 Right lower quadrant pain: Secondary | ICD-10-CM | POA: Insufficient documentation

## 2015-09-13 DIAGNOSIS — R509 Fever, unspecified: Secondary | ICD-10-CM | POA: Diagnosis not present

## 2015-09-13 DIAGNOSIS — K5792 Diverticulitis of intestine, part unspecified, without perforation or abscess without bleeding: Secondary | ICD-10-CM

## 2015-09-13 DIAGNOSIS — K5732 Diverticulitis of large intestine without perforation or abscess without bleeding: Secondary | ICD-10-CM | POA: Diagnosis not present

## 2015-09-13 DIAGNOSIS — K769 Liver disease, unspecified: Secondary | ICD-10-CM | POA: Insufficient documentation

## 2015-09-13 DIAGNOSIS — R110 Nausea: Secondary | ICD-10-CM

## 2015-09-13 LAB — POC MICROSCOPIC URINALYSIS (UMFC): MUCUS RE: ABSENT

## 2015-09-13 LAB — POCT URINALYSIS DIP (MANUAL ENTRY)
BILIRUBIN UA: NEGATIVE
BILIRUBIN UA: NEGATIVE
Blood, UA: NEGATIVE
GLUCOSE UA: NEGATIVE
Nitrite, UA: NEGATIVE
PH UA: 7
Protein Ur, POC: NEGATIVE
Spec Grav, UA: 1.015
Urobilinogen, UA: 0.2

## 2015-09-13 LAB — POCT CBC
Granulocyte percent: 83.6 %G — AB (ref 37–80)
HCT, POC: 37.4 % — AB (ref 37.7–47.9)
HEMOGLOBIN: 13 g/dL (ref 12.2–16.2)
Lymph, poc: 0.8 (ref 0.6–3.4)
MCH: 29.2 pg (ref 27–31.2)
MCHC: 34.7 g/dL (ref 31.8–35.4)
MCV: 84.3 fL (ref 80–97)
MID (cbc): 0.7 (ref 0–0.9)
MPV: 6.3 fL (ref 0–99.8)
PLATELET COUNT, POC: 224 10*3/uL (ref 142–424)
POC Granulocyte: 7.5 — AB (ref 2–6.9)
POC LYMPH PERCENT: 8.9 %L — AB (ref 10–50)
POC MID %: 7.5 %M (ref 0–12)
RBC: 4.44 M/uL (ref 4.04–5.48)
RDW, POC: 14.6 %
WBC: 9 10*3/uL (ref 4.6–10.2)

## 2015-09-13 MED ORDER — METRONIDAZOLE 500 MG PO TABS: 500.0000 mg | ORAL_TABLET | Freq: Three times a day (TID) | ORAL | Status: DC

## 2015-09-13 MED ORDER — TRAMADOL HCL 50 MG PO TABS: ORAL_TABLET | ORAL | Status: DC

## 2015-09-13 MED ORDER — CIPROFLOXACIN HCL 500 MG PO TABS: 500.0000 mg | ORAL_TABLET | Freq: Two times a day (BID) | ORAL | Status: DC

## 2015-09-13 MED ORDER — IOPAMIDOL (ISOVUE-300) INJECTION 61%
100.0000 mL | Freq: Once | INTRAVENOUS | Status: AC | PRN
  Administered 2015-09-13: 100 mL via INTRAVENOUS

## 2015-09-13 NOTE — Patient Instructions (Addendum)
We recommend that you schedule a mammogram for breast cancer screening. Typically, you do not need a referral to do this. Please contact a local imaging center to schedule your mammogram.  [facility] - [phone]  *ask for the Radiology Department [facility] ([place]) - [phone] or [phone]  [facility] - [phone] [place] [phone] [facility] - [phone]  *ask for the [facility] - [phone]  *ask for the Radiology Department [facility] - [phone]  *ask for the [facility] - [phone]    IF you received an x-ray today, you will receive an invoice from [facility]. Please contact [facility] at [phone] with questions or concerns regarding your invoice.   IF you received labwork today, you will receive an invoice from [employer]. Please contact [facility] at [phone] with questions or concerns regarding your invoice.   Our billing staff will not be able to assist you with questions regarding bills from these companies.  You will be contacted with the lab results as soon as they are available. The fastest way to get your results is to activate your My Chart account. Instructions are located on the last page of this paperwork. If you have not heard from [place] regarding the results in 2 weeks, please contact this office.

## 2015-09-13 NOTE — Telephone Encounter (Signed)
Acute divertic by scan Meds ordered this encounter  Medications  . ciprofloxacin (CIPRO) 500 MG tablet    Sig: Take 1 tablet (500 mg total) by mouth 2 (two) times daily.    Dispense:  14 tablet    Refill:  0  . metroNIDAZOLE (FLAGYL) 500 MG tablet    Sig: Take 1 tablet (500 mg total) by mouth 3 (three) times daily.    Dispense:  21 tablet    Refill:  0   She has zofran and we'll call in tramadol Ref to her gi for f/u Dr Collene Mares  IMPRESSION: Acute sigmoid diverticulitis with infiltrative changes and extraluminal gas within sigmoid mesocolon.  No discrete/defined abscess collection or free intraperitoneal air/fluid identified.  Reactive wall thickening and enhancement of the adjacent LEFT lateral bladder wall.  Stable multiloculated cystic lesion within liver.

## 2015-09-13 NOTE — Telephone Encounter (Signed)
Spoke with patient and informed her, per Dr Leta Baptist her JCV is positive and will be monitored periodically. Also informed her that he will continue Tecfidera. She then stated she had a fever yesterday afternoon and had some lower abdominal discomfort. Advised she contact her PCP due to possibly having UTI or other illness. She agreed, verbalized understanding, appreciation of call.

## 2015-09-13 NOTE — Progress Notes (Addendum)
By signing my name below I, Tereasa Coop, attest that this documentation has been prepared under the direction and in the presence of Tami Lin, MD. Electonically Signed. Tereasa Coop, Scribe 09/13/2015 at 3:13 PM   Subjective:    Patient ID: Katie Maldonado, female    DOB: 12-30-1950, 65 y.o.   MRN: [id number]  Chief Complaint  Patient presents with  . Fever    started yesterday  . Abdominal Pain    left side  . Nausea    HPI Katie Maldonado is a 65 y.o. female who presents to the [facility] complaining of LLQ abd pain that started suddenly late yesterday. Pt also reports nausea and fever with abd pain. Abd pain is worsened by laying back. No vomiting but unable to eat/loss of appetite. No intake today.  Pt denies dysuria, V/D, or urinary frequency.  Pt had a recent colonoscopy. 3-4y ago--not told if Diverticuli.   Patient Active Problem List   Diagnosis Date Noted  . Degenerative cervical disc 10/04/2014  . [place] arthritis 08/19/2014  . Ankle sprain 04/14/2014  . Low back pain 11/26/2013  . Numbness and tingling in left arm 07/09/2013  . Shoulder bursitis 06/18/2013  . Anxiety state, unspecified 12/30/2012  . Other malaise and fatigue 12/30/2012  . Osteoporosis 11/13/2012  . Osteoarthritis of multiple joints 04/18/2011  . Incontinence of urine 04/18/2011  . HYPERLIPIDEMIA-MIXED 01/08/2010  . MULTIPLE SCLEROSIS 01/08/2010  . DYSPNEA 01/05/2010    Current outpatient prescriptions:  .  alendronate (FOSAMAX) 70 MG tablet, Take 1 tablet (70 mg total) by mouth once a week. Take with a full glass of water on an empty stomach., Disp: 12 tablet, Rfl: 3 .  aspirin EC 81 MG tablet, Take 1 tablet (81 mg total) by mouth daily., Disp: , Rfl:  .  cholecalciferol (VITAMIN D) 1000 UNITS tablet, Take 1,000 Units by mouth daily. Reported on 08/08/2015, Disp: , Rfl:  .  citalopram (CELEXA) 20 MG tablet, Take 1 tablet (20 mg total) by mouth daily.,  Disp: 90 tablet, Rfl: 1 .  Dimethyl Fumarate (TECFIDERA) 240 MG CPDR, Take 240 mg by mouth 2 (two) times daily., Disp: , Rfl:  .  gabapentin (NEURONTIN) 300 MG capsule, Take 1 capsule (300 mg total) by mouth 2 (two) times daily., Disp: 60 capsule, Rfl: 12 .  ibuprofen (ADVIL,MOTRIN) 200 MG tablet, Take 2 tablets (400 mg total) by mouth 4 (four) times daily., Disp: , Rfl:  .  mirabegron ER (MYRBETRIQ) 50 MG TB24 tablet, Take 50 mg by mouth daily., Disp: , Rfl:  .  Omega-3 Fatty Acids (FISH OIL) 1000 MG CAPS, Take 1,000 mg by mouth 2 (two) times daily. , Disp: , Rfl: 0 .  omeprazole (PRILOSEC) 20 MG capsule, TAKE 1 CAPSULE BY MOUTH ONCE DAILY, Disp: 90 capsule, Rfl: 1 .  ondansetron (ZOFRAN-ODT) 4 MG disintegrating tablet, DISSOLVE 1 TABLET ON TONGUE EVERY 6 HOURS AS NEEDED, Disp: 60 tablet, Rfl: 0 .  TURMERIC PO, Take 1 tablet by mouth daily., Disp: , Rfl:  No current facility-administered medications for this visit.  Facility-Administered Medications Ordered in Other Visits:  .  gadopentetate dimeglumine (MAGNEVIST) injection 15 mL, 15 mL, Intravenous, Once PRN, Vikram R Penumalli, MD .  gadopentetate dimeglumine (MAGNEVIST) injection 16 mL, 16 mL, Intravenous, Once PRN, Penni Bombard, MD  Allergies  Allergen Reactions  . Nitrofurantoin Shortness Of Breath    Shortness of breath, violent chills  . Sulfonamide Derivatives Shortness Of Breath  Shortness of breath, chills  . Cefdinir     Can't remember what rx had 2 yrs ago  . Copaxone [Glatiramer Acetate]     Past Surgical History  Procedure Laterality Date  . Hernia repair    . Tubal ligation    . Wrist surgery    . Arm surgery left    . Abdominal hysterectomy      Fibroids     Review of Systems  Constitutional: Positive for fever.  Gastrointestinal: Positive for nausea and abdominal pain. Negative for vomiting and diarrhea.  Genitourinary: Negative for dysuria and frequency.  see recent [facility] ov for freq  syndr Neuro-see MS notes stable No cough/uri/ST/rash     Objective:   Physical Exam  Constitutional: She is oriented to person, place, and time. She appears well-developed and well-nourished.  Obviously uncomfortable  HENT:  Head: Normocephalic and atraumatic.  Eyes: Conjunctivae are normal. Pupils are equal, round, and reactive to light.  Neck: Neck supple.  Cardiovascular: Normal rate.   Pulmonary/Chest: Effort normal.  Abdominal: Normal appearance and bowel sounds are normal. There is no hepatosplenomegaly, splenomegaly or hepatomegaly. There is tenderness (percussion tenderness on LLQ) in the right lower quadrant and left lower quadrant. There is rebound (LLQ and RLQ). There is no CVA tenderness.  Musculoskeletal: Normal range of motion.  Neurological: She is alert and oriented to person, place, and time.  Skin: Skin is warm and dry.  Psychiatric: She has a normal mood and affect. Her behavior is normal.  Nursing note and vitals reviewed.    BP 105/71 mmHg  Pulse 83  Temp(Src) 98.9 F (37.2 C)  Resp 16  Ht 5\' 4"  (1.626 m)  Wt 158 lb (71.668 kg)  BMI 27.11 kg/m2  Results for orders placed or performed in visit on 09/13/15  POCT CBC  Result Value Ref Range   WBC 9.0 4.6 - 10.2 K/uL   Lymph, poc 0.8 0.6 - 3.4   POC LYMPH PERCENT 8.9 (A) 10 - 50 %L   MID (cbc) 0.7 0 - 0.9   POC MID % 7.5 0 - 12 %M   POC Granulocyte 7.5 (A) 2 - 6.9   Granulocyte percent 83.6 (A) 37 - 80 %G   RBC 4.44 4.04 - 5.48 M/uL   Hemoglobin 13.0 12.2 - 16.2 g/dL   HCT, POC 37.4 (A) 37.7 - 47.9 %   MCV 84.3 80 - 97 fL   MCH, POC 29.2 27 - 31.2 pg   MCHC 34.7 31.8 - 35.4 g/dL   RDW, POC 14.6 %   Platelet Count, POC 224 142 - 424 K/uL   MPV 6.3 0 - 99.8 fL  POCT Microscopic Urinalysis ([facility])  Result Value Ref Range   WBC,UR,HPF,POC None None WBC/hpf   RBC,UR,HPF,POC None None RBC/hpf   Bacteria None None, Too numerous to count   Mucus Absent Absent   Epithelial Cells, UR Per Microscopy Few  (A) None, Too numerous to count cells/hpf  POCT urinalysis dipstick  Result Value Ref Range   Color, UA yellow yellow   Clarity, UA clear clear   Glucose, UA negative negative   Bilirubin, UA negative negative   Ketones, POC UA negative negative   Spec Grav, UA 1.015    Blood, UA negative negative   pH, UA 7.0    Protein Ur, POC negative negative   Urobilinogen, UA 0.2    Nitrite, UA Negative Negative   Leukocytes, UA Trace (A) Negative  Assessment & Plan:  Abdominal pain, LLQ -  Abdominal pain, RLQ Fever Nausea without vomiting  - Plan: CT Abdomen Pelvis W Contrast  I have completed the patient encounter in its entirety as documented by the scribe, with editing by me where necessary. Zsazsa Bahena P. Laney Pastor, M.D.  Called by radiology= IMPRESSION: Acute sigmoid diverticulitis with infiltrative changes and extraluminal gas within sigmoid mesocolon.  No discrete/defined abscess collection or free intraperitoneal air/fluid identified.  Reactive wall thickening and enhancement of the adjacent LEFT lateral bladder wall.  Stable multiloculated cystic lesion within liver.   Will start flag/cipro/zofran/tramad and fluids and f/u with [place] and Dr Collene Mares

## 2015-09-13 NOTE — Telephone Encounter (Signed)
Called in Tramadol per Dr Laney Pastor, he has taken a call report from radiology and patient has diverticulitis

## 2015-09-15 ENCOUNTER — Telehealth: Payer: Self-pay

## 2015-09-15 ENCOUNTER — Encounter: Payer: Self-pay | Admitting: Internal Medicine

## 2015-09-15 NOTE — Telephone Encounter (Signed)
Pt needs her out of work note extended to return on Monday  Best number [phone]

## 2015-09-15 NOTE — Telephone Encounter (Signed)
Attempted to call pt, left VM

## 2015-09-18 ENCOUNTER — Ambulatory Visit (INDEPENDENT_AMBULATORY_CARE_PROVIDER_SITE_OTHER): Payer: 59 | Admitting: Physician Assistant

## 2015-09-18 VITALS — BP 122/64 | HR 73 | Temp 98.3°F | Resp 16 | Ht 64.0 in | Wt 157.0 lb

## 2015-09-18 DIAGNOSIS — N390 Urinary tract infection, site not specified: Secondary | ICD-10-CM

## 2015-09-18 DIAGNOSIS — R197 Diarrhea, unspecified: Secondary | ICD-10-CM

## 2015-09-18 DIAGNOSIS — Z09 Encounter for follow-up examination after completed treatment for conditions other than malignant neoplasm: Secondary | ICD-10-CM

## 2015-09-18 LAB — POCT CBC
GRANULOCYTE PERCENT: 72.8 % (ref 37–80)
HCT, POC: 36.8 % — AB (ref 37.7–47.9)
Hemoglobin: 12.7 g/dL (ref 12.2–16.2)
Lymph, poc: 0.9 (ref 0.6–3.4)
MCH: 28.9 pg (ref 27–31.2)
MCHC: 34.7 g/dL (ref 31.8–35.4)
MCV: 83.2 fL (ref 80–97)
MID (CBC): 0.5 (ref 0–0.9)
MPV: 6.2 fL (ref 0–99.8)
PLATELET COUNT, POC: 316 10*3/uL (ref 142–424)
POC GRANULOCYTE: 3.6 (ref 2–6.9)
POC LYMPH PERCENT: 17.6 %L (ref 10–50)
POC MID %: 9.6 %M (ref 0–12)
RBC: 4.42 M/uL (ref 4.04–5.48)
RDW, POC: 14.5 %
WBC: 5 10*3/uL (ref 4.6–10.2)

## 2015-09-18 LAB — POCT URINALYSIS DIP (MANUAL ENTRY)
Glucose, UA: NEGATIVE
Nitrite, UA: POSITIVE — AB
PH UA: 6
RBC UA: NEGATIVE
SPEC GRAV UA: 1.025
UROBILINOGEN UA: 0.2

## 2015-09-18 LAB — POC MICROSCOPIC URINALYSIS (UMFC)

## 2015-09-18 MED ORDER — FOSFOMYCIN TROMETHAMINE 3 G PO PACK: 3.0000 g | PACK | Freq: Once | ORAL | Status: DC

## 2015-09-18 NOTE — Telephone Encounter (Signed)
Patient is returning a missed phone call about getting a doctors note. She was diagnosed with diverticulitis and needs a note to return today. [phone] (work number) [phone] (cell)

## 2015-09-18 NOTE — Progress Notes (Signed)
09/19/2015 3:31 PM   DOB: October 15, 1950 / MRN: [id number]  SUBJECTIVE:  Katie Maldonado is a 65 y.o. female presenting for recheck of a UTI. She was last seen by Dr. Laney Pastor and treated for diverticulitis with cipro and flagyl, and reports her left lower quadrant pain is roughly 70% improved.  She complains of continued dysuria, urgency and frequency despite Cipro therapy.     She complains of diarrhea that started 7 days ago.  She complains of fecal urgency. She took amox-clav about 1.5 months ago.    She is allergic to nitrofurantoin; sulfonamide derivatives; cefdinir; and copaxone.   She  has a past medical history of Multiple sclerosis ([place]); Other and unspecified hyperlipidemia; Obesity, unspecified; Allergy; Arthritis; Neuromuscular disorder ([place]); Osteoporosis; TIA (transient ischemic attack) (20); Migraine; Hypersomnolence; and Anxiety.    She  reports that she quit smoking about 11 years ago. Her smoking use included Cigarettes. She quit after 37 years of use. She has never used smokeless tobacco. She reports that she drinks alcohol. She reports that she does not use illicit drugs. She  reports that she currently engages in sexual activity. She reports using the following method of birth control/protection: None. The patient  has past surgical history that includes Hernia repair; Tubal ligation; Wrist surgery; arm surgery left; and Abdominal hysterectomy.  Her family history includes Cancer in her brother, maternal grandfather, maternal grandmother, paternal grandfather, and paternal grandmother; Emphysema in her father; Heart attack in her brother; Heart disease in her brother.  Review of Systems  Constitutional: Negative for fever and chills.  Eyes: Negative for blurred vision.  Respiratory: Negative for cough and shortness of breath.   Cardiovascular: Negative for chest pain.  Gastrointestinal: Negative for nausea and abdominal pain.  Genitourinary: Negative for dysuria,  urgency and frequency.  Musculoskeletal: Negative for myalgias.  Skin: Negative for rash.  Neurological: Negative for dizziness, tingling and headaches.  Psychiatric/Behavioral: Negative for depression. The patient is not nervous/anxious.     Problem list and medications reviewed and updated by myself where necessary, and exist elsewhere in the encounter.   OBJECTIVE:  BP 122/64 mmHg  Pulse 73  Temp(Src) 98.3 F (36.8 C) (Oral)  Resp 16  Ht 5\' 4"  (1.626 m)  Wt 157 lb (71.215 kg)  BMI 26.94 kg/m2  SpO2 98%  Physical Exam  Constitutional: She is oriented to person, place, and time. She appears well-nourished. No distress.  Eyes: EOM are normal. Pupils are equal, round, and reactive to light.  Cardiovascular: Normal rate.   Pulmonary/Chest: Effort normal.  Abdominal: She exhibits no distension.  Neurological: She is alert and oriented to person, place, and time. No cranial nerve deficit. Gait normal.  Skin: Skin is dry. She is not diaphoretic.  Psychiatric: She has a normal mood and affect.  Vitals reviewed.   Results for orders placed or performed in visit on 09/18/15 (from the past 72 hour(s))  POCT CBC     Status: Abnormal   Collection Time: 09/18/15  6:37 PM  Result Value Ref Range   WBC 5.0 4.6 - 10.2 K/uL   Lymph, poc 0.9 0.6 - 3.4   POC LYMPH PERCENT 17.6 10 - 50 %L   MID (cbc) 0.5 0 - 0.9   POC MID % 9.6 0 - 12 %M   POC Granulocyte 3.6 2 - 6.9   Granulocyte percent 72.8 37 - 80 %G   RBC 4.42 4.04 - 5.48 M/uL   Hemoglobin 12.7 12.2 - 16.2 g/dL  HCT, POC 36.8 (A) 37.7 - 47.9 %   MCV 83.2 80 - 97 fL   MCH, POC 28.9 27 - 31.2 pg   MCHC 34.7 31.8 - 35.4 g/dL   RDW, POC 14.5 %   Platelet Count, POC 316 142 - 424 K/uL   MPV 6.2 0 - 99.8 fL  POCT urinalysis dipstick     Status: Abnormal   Collection Time: 09/18/15  6:51 PM  Result Value Ref Range   Color, UA yellow yellow   Clarity, UA clear clear   Glucose, UA negative negative   Bilirubin, UA small (A)  negative   Ketones, POC UA trace (5) (A) negative   Spec Grav, UA 1.025    Blood, UA negative negative   pH, UA 6.0    Protein Ur, POC =30 (A) negative   Urobilinogen, UA 0.2    Nitrite, UA Positive (A) Negative   Leukocytes, UA small (1+) (A) Negative  POCT Microscopic Urinalysis ([facility])     Status: Abnormal   Collection Time: 09/18/15  6:51 PM  Result Value Ref Range   WBC,UR,HPF,POC Many (A) None WBC/hpf   RBC,UR,HPF,POC Few (A) None RBC/hpf   Bacteria Moderate (A) None, Too numerous to count   Mucus Present (A) Absent   Epithelial Cells, UR Per Microscopy Moderate (A) None, Too numerous to count cells/hpf    No results found.  ASSESSMENT AND PLAN  Katie Maldonado was seen today for follow-up.  Diagnoses and all orders for this visit:  Katie Maldonado was seen today for follow-up.  Diagnoses and all orders for this visit:  Follow up -     POCT urinalysis dipstick -     POCT Microscopic Urinalysis ([facility]) -     Urine culture -     POCT CBC  Diarrhea, unspecified type: Will evaluate for Cdiff given her history of amox-clav administration.  -     Stool pathogen panel  UTI (lower urinary tract infection): She has multiple drug allergies to UTI medication. Will try her on fosfomycin.  Culture is out.  -     fosfomycin (MONUROL) 3 g PACK; Take 3 g by mouth once.       The patient was advised to call or return to clinic if she does not see an improvement in symptoms or to seek the care of the closest emergency department if she worsens with the above plan.   Philis Fendt, MHS, PA-C [facility] 09/19/2015 3:31 PM

## 2015-09-18 NOTE — Telephone Encounter (Signed)
Letter ready.

## 2015-09-19 ENCOUNTER — Other Ambulatory Visit: Payer: Self-pay

## 2015-09-19 MED ORDER — FOSFOMYCIN TROMETHAMINE 3 G PO PACK: 3.0000 g | PACK | Freq: Once | ORAL | Status: DC

## 2015-09-19 MED FILL — MONUROL 3 GM SACHET: 3 | 1 days supply | Qty: 1 | Fill #0

## 2015-09-20 DIAGNOSIS — R197 Diarrhea, unspecified: Secondary | ICD-10-CM | POA: Diagnosis not present

## 2015-09-20 LAB — URINE CULTURE
Colony Count: NO GROWTH
Organism ID, Bacteria: NO GROWTH

## 2015-09-20 NOTE — Addendum Note (Signed)
Addended by: Kem Boroughs D on: 09/20/2015 01:23 PM   Modules accepted: Orders

## 2015-09-21 LAB — GASTROINTESTINAL PATHOGEN PANEL PCR
C. difficile Tox A/B, PCR: NOT DETECTED
CAMPYLOBACTER, PCR: NOT DETECTED
CRYPTOSPORIDIUM, PCR: NOT DETECTED
E COLI (STEC) STX1/STX2, PCR: NOT DETECTED
E coli (ETEC) LT/ST PCR: NOT DETECTED
E coli 0157, PCR: NOT DETECTED
GIARDIA LAMBLIA, PCR: NOT DETECTED
NOROVIRUS, PCR: NOT DETECTED
ROTAVIRUS, PCR: NOT DETECTED
Salmonella, PCR: NOT DETECTED
Shigella, PCR: NOT DETECTED

## 2015-10-02 MED FILL — MYRBETRIQ ER 50 MG TABLET: 50 | 30 days supply | Qty: 30 | Fill #1

## 2015-10-03 DIAGNOSIS — R933 Abnormal findings on diagnostic imaging of other parts of digestive tract: Secondary | ICD-10-CM | POA: Diagnosis not present

## 2015-10-03 DIAGNOSIS — K5730 Diverticulosis of large intestine without perforation or abscess without bleeding: Secondary | ICD-10-CM | POA: Diagnosis not present

## 2015-10-03 DIAGNOSIS — K219 Gastro-esophageal reflux disease without esophagitis: Secondary | ICD-10-CM | POA: Diagnosis not present

## 2015-10-03 DIAGNOSIS — R1032 Left lower quadrant pain: Secondary | ICD-10-CM | POA: Diagnosis not present

## 2015-10-04 MED FILL — CIPROFLOXACIN HCL 500 MG TA: 500 | 30 days supply | Qty: 60 | Fill #0

## 2015-10-04 MED FILL — metroNIDAZOLE 250 MG TABS: 250 | 30 days supply | Qty: 90 | Fill #0

## 2015-10-19 ENCOUNTER — Other Ambulatory Visit: Payer: Self-pay | Admitting: Family Medicine

## 2015-10-20 ENCOUNTER — Other Ambulatory Visit: Payer: Self-pay | Admitting: Family Medicine

## 2015-10-20 DIAGNOSIS — K5792 Diverticulitis of intestine, part unspecified, without perforation or abscess without bleeding: Secondary | ICD-10-CM | POA: Diagnosis not present

## 2015-10-28 DIAGNOSIS — K5790 Diverticulosis of intestine, part unspecified, without perforation or abscess without bleeding: Secondary | ICD-10-CM

## 2015-10-28 HISTORY — DX: Diverticulosis of intestine, part unspecified, without perforation or abscess without bleeding: K57.90

## 2015-11-03 ENCOUNTER — Ambulatory Visit (INDEPENDENT_AMBULATORY_CARE_PROVIDER_SITE_OTHER): Payer: 59 | Admitting: Physician Assistant

## 2015-11-03 VITALS — BP 110/68 | HR 75 | Temp 98.9°F | Resp 18 | Ht 64.0 in | Wt 161.0 lb

## 2015-11-03 DIAGNOSIS — R110 Nausea: Secondary | ICD-10-CM

## 2015-11-03 MED ORDER — SCOPOLAMINE 1 MG/3DAYS TD PT72: 1.0000 | MEDICATED_PATCH | TRANSDERMAL | Status: DC

## 2015-11-03 MED ORDER — ONDANSETRON 4 MG PO TBDP: 4.0000 mg | ORAL_TABLET | Freq: Three times a day (TID) | ORAL | Status: DC | PRN

## 2015-11-03 NOTE — Progress Notes (Signed)
Katie Maldonado  MRN: [id number] DOB: 06/24/1950  Subjective:  Katie Maldonado is a 65 y.o. female seen in office today for a medication refill on Zofran and also needs a sea sickness patch.  Pt has been on Zofran since 2001. Reports being constantly nauseous due to the side effect MS medications. Also has a history of BPPV. Zofran is the only thing she can do for nausea that does not make her tired. Denies SOB, confusion, dry mouth, and constipation.  Pt is going on a seven day cruise on August 13th and notes that she gets extremely motion sick while on boats and would like to avoid this so she can enjoy her trip.   Reports anxiety but takes citalapram for this, reports diarrhea but recently had a diverticulitis episode and was on antibiotics and just finished. Believes the diarrhea will stop now that she is off antibiotics. Pt is taking a probiotic.   Pt has no other concerns or complaints at this time.  Review of Systems  Constitutional: Negative for fever, chills and fatigue.    Patient Active Problem List   Diagnosis Date Noted  . Degenerative cervical disc 10/04/2014  . [place] arthritis 08/19/2014  . Ankle sprain 04/14/2014  . Low back pain 11/26/2013  . Numbness and tingling in left arm 07/09/2013  . Shoulder bursitis 06/18/2013  . Anxiety state, unspecified 12/30/2012  . Other malaise and fatigue 12/30/2012  . Osteoporosis 11/13/2012  . Osteoarthritis of multiple joints 04/18/2011  . Incontinence of urine 04/18/2011  . HYPERLIPIDEMIA-MIXED 01/08/2010  . MULTIPLE SCLEROSIS 01/08/2010  . DYSPNEA 01/05/2010    Current Outpatient Prescriptions on File Prior to Visit  Medication Sig Dispense Refill  . alendronate (FOSAMAX) 70 MG tablet Take 1 tablet (70 mg total) by mouth once a week. Take with a full glass of water on an empty stomach. 12 tablet 3  . aspirin EC 81 MG tablet Take 1 tablet (81 mg total) by mouth daily.    . cholecalciferol (VITAMIN D) 1000 UNITS  tablet Take 1,000 Units by mouth daily. Reported on 08/08/2015    . citalopram (CELEXA) 20 MG tablet Take 1 tablet (20 mg total) by mouth daily. 90 tablet 1  . Dimethyl Fumarate (TECFIDERA) 240 MG CPDR Take 240 mg by mouth 2 (two) times daily.    Marland Kitchen gabapentin (NEURONTIN) 300 MG capsule Take 1 capsule (300 mg total) by mouth 2 (two) times daily. 60 capsule 12  . ibuprofen (ADVIL,MOTRIN) 200 MG tablet Take 2 tablets (400 mg total) by mouth 4 (four) times daily.    . metroNIDAZOLE (FLAGYL) 500 MG tablet Take 1 tablet (500 mg total) by mouth 3 (three) times daily. 21 tablet 0  . mirabegron ER (MYRBETRIQ) 50 MG TB24 tablet Take 50 mg by mouth daily.    . Omega-3 Fatty Acids (FISH OIL) 1000 MG CAPS Take 1,000 mg by mouth 2 (two) times daily.   0  . omeprazole (PRILOSEC) 20 MG capsule TAKE 1 CAPSULE BY MOUTH ONCE DAILY 90 capsule 1  . ondansetron (ZOFRAN-ODT) 4 MG disintegrating tablet DISSOLVE 1 TABLET ON TONGUE EVERY 6 HOURS AS NEEDED 60 tablet 0  . TURMERIC PO Take 1 tablet by mouth daily.    . fosfomycin (MONUROL) 3 g PACK Take 3 g by mouth once. (Patient not taking: Reported on 11/03/2015) 1 g 0  . traMADol (ULTRAM) 50 MG tablet One -two  q 6 hrs prn pain (Patient not taking: Reported on 11/03/2015) 20 tablet 0  Current Facility-Administered Medications on File Prior to Visit  Medication Dose Route Frequency Provider Last Rate Last Dose  . gadopentetate dimeglumine (MAGNEVIST) injection 15 mL  15 mL Intravenous Once PRN Penni Bombard, MD      . gadopentetate dimeglumine (MAGNEVIST) injection 16 mL  16 mL Intravenous Once PRN Penni Bombard, MD        Allergies  Allergen Reactions  . Nitrofurantoin Shortness Of Breath    Shortness of breath, violent chills  . Sulfonamide Derivatives Shortness Of Breath    Shortness of breath, chills  . Cefdinir     Can't remember what rx had 2 yrs ago  . Copaxone [Glatiramer Acetate]     Objective:  BP 110/68 mmHg  Pulse 75  Temp(Src) 98.9 F  (37.2 C) (Oral)  Resp 18  Ht 5\' 4"  (1.626 m)  Wt 161 lb (73.029 kg)  BMI 27.62 kg/m2  SpO2 98%  Physical Exam  Constitutional: She is oriented to person, place, and time and well-developed, well-nourished, and in no distress.  HENT:  Head: Normocephalic and atraumatic.  Right Ear: Tympanic membrane, external ear and ear canal normal.  Left Ear: Tympanic membrane, external ear and ear canal normal.  Eyes: Conjunctivae are normal.  Neck: Normal range of motion.  Pulmonary/Chest: Effort normal.  Neurological: She is alert and oriented to person, place, and time. Gait normal.  Skin: Skin is warm and dry.  Psychiatric: Affect normal.  Vitals reviewed.   Assessment and Plan :   1. Nausea without vomiting -This is a chronic issue for patient. Pt has history of MS and has been told that her medications for MS will cause her to be nauseous. She has tried multiple OTC medications but they all make her drowsy and she cannot work when she is drowsy. Therefore, Zofran has been the best option for her. I will continue with this medication for her nausea.  -Refilled ondansetron (ZOFRAN-ODT) 4 MG disintegrating tablet; Take 1 tablet (4 mg total) by mouth every 8 (eight) hours as needed for nausea or vomiting.  Dispense: 60 tablet; Refill: 0 -Prescribed scopolamine (TRANSDERM-SCOP, 1.5 MG,) 1 MG/3DAYS; Place 1 patch (1.5 mg total) onto the skin every 3 (three) days.  Dispense: 10 patch; Refill: 0 given for cruise in August. Informed pt on how to use. Stressed the importance that a patch can stay on for 72 hours but once pt goes to place a new patch make sure the prior patch is removed. Also educated to avoid alcohol while on the patch.   Tenna Delaine PA-C  [facility] 11/03/2015 5:22 PM

## 2015-11-03 NOTE — Patient Instructions (Addendum)
   Transderm patch: one patch lasts 72 hours, then take off and put a new patch on. When using the transderm patch make sure you take the previous patch off before placing the new one on. Avoid alcohol while on this patch.   IF you received an x-ray today, you will receive an invoice from [facility]. Please contact [facility] at [phone] with questions or concerns regarding your invoice.   IF you received labwork today, you will receive an invoice from [employer]. Please contact [facility] at [phone] with questions or concerns regarding your invoice.   Our billing staff will not be able to assist you with questions regarding bills from these companies.  You will be contacted with the lab results as soon as they are available. The fastest way to get your results is to activate your My Chart account. Instructions are located on the last page of this paperwork. If you have not heard from [place] regarding the results in 2 weeks, please contact this office.

## 2015-11-06 MED FILL — TRANSDERM-SCOP 1.5 MG/3 DAY: 1 | 30 days supply | Qty: 10 | Fill #0

## 2015-11-06 MED FILL — ONDANSETRON ODT 4 MG TABLET: 4 | 20 days supply | Qty: 60 | Fill #0

## 2015-11-13 MED FILL — MYRBETRIQ ER 50 MG TABLET: 50 | 30 days supply | Qty: 30 | Fill #2

## 2015-11-13 MED FILL — CITALOPRAM HBR 20 MG TABLET: 20 | 90 days supply | Qty: 90 | Fill #1

## 2015-11-27 ENCOUNTER — Other Ambulatory Visit: Payer: Self-pay | Admitting: *Deleted

## 2015-11-27 ENCOUNTER — Telehealth: Payer: Self-pay | Admitting: *Deleted

## 2015-11-27 DIAGNOSIS — G35 Multiple sclerosis: Secondary | ICD-10-CM

## 2015-11-27 DIAGNOSIS — R5382 Chronic fatigue, unspecified: Secondary | ICD-10-CM

## 2015-11-27 DIAGNOSIS — R200 Anesthesia of skin: Secondary | ICD-10-CM

## 2015-11-27 DIAGNOSIS — M791 Myalgia, unspecified site: Secondary | ICD-10-CM

## 2015-11-27 NOTE — Telephone Encounter (Signed)
LVM requesting patient come in within the next week for repeat labs per Dr Leta Baptist. Left office lab hours of operation. Left name and number for questions.

## 2015-11-30 ENCOUNTER — Ambulatory Visit (INDEPENDENT_AMBULATORY_CARE_PROVIDER_SITE_OTHER): Payer: 59 | Admitting: Physician Assistant

## 2015-11-30 VITALS — BP 116/74 | HR 67 | Temp 98.4°F | Resp 18 | Ht 64.0 in | Wt 159.4 lb

## 2015-11-30 DIAGNOSIS — R300 Dysuria: Secondary | ICD-10-CM | POA: Diagnosis not present

## 2015-11-30 DIAGNOSIS — N3090 Cystitis, unspecified without hematuria: Secondary | ICD-10-CM

## 2015-11-30 LAB — POC MICROSCOPIC URINALYSIS (UMFC): MUCUS RE: ABSENT

## 2015-11-30 LAB — POCT URINALYSIS DIP (MANUAL ENTRY)
BILIRUBIN UA: NEGATIVE
Blood, UA: NEGATIVE
Glucose, UA: NEGATIVE
Ketones, POC UA: NEGATIVE
NITRITE UA: POSITIVE — AB
PH UA: 7
Protein Ur, POC: NEGATIVE
Spec Grav, UA: 1.01
Urobilinogen, UA: 0.2

## 2015-11-30 MED ORDER — FOSFOMYCIN TROMETHAMINE 3 G PO PACK: 3.0000 g | PACK | Freq: Once | ORAL | 0 refills | Status: AC

## 2015-11-30 NOTE — Patient Instructions (Addendum)
Drink plenty of water (at least 64 oz per day). Take antibiotic as prescribed until finished. Cranberry juice/pills can help. You can buy AZO over the counter and take as needed for pain. Follow directions on the packaging. I will call you with results from your urine culture. If symptoms are not improving in 1 week, return to clinic.    IF you received an x-ray today, you will receive an invoice from [facility]. Please contact [facility] at [phone] with questions or concerns regarding your invoice.   IF you received labwork today, you will receive an invoice from [employer]. Please contact [facility] at [phone] with questions or concerns regarding your invoice.   Our billing staff will not be able to assist you with questions regarding bills from these companies.  You will be contacted with the lab results as soon as they are available. The fastest way to get your results is to activate your My Chart account. Instructions are located on the last page of this paperwork. If you have not heard from [place] regarding the results in 2 weeks, please contact this office.

## 2015-11-30 NOTE — Progress Notes (Signed)
[facility] [address] [place] [address] [phone]  Date:  11/30/2015   Name:  Katie Maldonado   DOB:  02-08-1951   MRN:  [id number]  PCP:  Elby Beck, FNP    Chief Complaint: Urinary Tract Infection (started yesterday, dysuria. )   History of Present Illness:  This is a 65 y.o. female with PMH arthritis, HLD, MS who is presenting with dysuria x 2 days. HAving some low back pain. No urinary frequency, she actually feels like she is going less.  Vaginal discharge: no Hematuria: no Abdominal pain: no Fever/chills: no Nausea/vominting: no Sexually active: yes, no new partners. Been with husband for 44 years. Previous UTI: yes, several. Last had UTI 2 months ago, treat with fosfomycin d/t multiple drug allergies. This helped. Last UTI before that 1 year ago. Aggravating/Alleviating factors: AZO helps.   Review of Systems:  Review of Systems See HPI  Patient Active Problem List   Diagnosis Date Noted  . Degenerative cervical disc 10/04/2014  . [place] arthritis 08/19/2014  . Low back pain 11/26/2013  . Numbness and tingling in left arm 07/09/2013  . Anxiety state, unspecified 12/30/2012  . Other malaise and fatigue 12/30/2012  . Osteoporosis 11/13/2012  . Osteoarthritis of multiple joints 04/18/2011  . Incontinence of urine 04/18/2011  . HYPERLIPIDEMIA-MIXED 01/08/2010  . MULTIPLE SCLEROSIS 01/08/2010    Prior to Admission medications   Medication Sig Start Date End Date Taking? Authorizing Provider  alendronate (FOSAMAX) 70 MG tablet Take 1 tablet (70 mg total) by mouth once a week. Take with a full glass of water on an empty stomach. 04/12/15  Yes Elby Beck, FNP  aspirin EC 81 MG tablet Take 1 tablet (81 mg total) by mouth daily. 04/18/11  Yes Zenia Resides, MD  cholecalciferol (VITAMIN D) 1000 UNITS tablet Take 1,000 Units by mouth daily. Reported on 08/08/2015   Yes Historical Provider, MD  citalopram (CELEXA) 20 MG  tablet Take 1 tablet (20 mg total) by mouth daily. 08/15/15  Yes Elby Beck, FNP  Dimethyl Fumarate (TECFIDERA) 240 MG CPDR Take 240 mg by mouth 2 (two) times daily.   Yes Historical Provider, MD  gabapentin (NEURONTIN) 300 MG capsule Take 1 capsule (300 mg total) by mouth 2 (two) times daily. 07/07/15  Yes Penni Bombard, MD  ibuprofen (ADVIL,MOTRIN) 200 MG tablet Take 2 tablets (400 mg total) by mouth 4 (four) times daily. 04/18/11  Yes Zenia Resides, MD  mirabegron ER (MYRBETRIQ) 50 MG TB24 tablet Take 50 mg by mouth daily.   Yes Historical Provider, MD  Omega-3 Fatty Acids (FISH OIL) 1000 MG CAPS Take 1,000 mg by mouth 2 (two) times daily.  04/18/11  Yes Zenia Resides, MD  omeprazole (PRILOSEC) 20 MG capsule TAKE 1 CAPSULE BY MOUTH ONCE DAILY 08/14/15  Yes Elby Beck, FNP  ondansetron (ZOFRAN-ODT) 4 MG disintegrating tablet Take 1 tablet (4 mg total) by mouth every 8 (eight) hours as needed for nausea or vomiting. 11/03/15  Yes Leonie Douglas, PA-C  scopolamine (TRANSDERM-SCOP, 1.5 MG,) 1 MG/3DAYS Place 1 patch (1.5 mg total) onto the skin every 3 (three) days. 11/03/15  Yes Leonie Douglas, PA-C  TURMERIC PO Take 1 tablet by mouth daily.   Yes Historical Provider, MD  fosfomycin (MONUROL) 3 g PACK Take 3 g by mouth once. Patient not taking: Reported on 11/03/2015 09/19/15   Tereasa Coop, PA-C  traMADol (ULTRAM) 50 MG tablet  One -two  q 6 hrs prn pain Patient not taking: Reported on 11/03/2015 09/13/15   Leandrew Koyanagi, MD    Allergies  Allergen Reactions  . Nitrofurantoin Shortness Of Breath    Shortness of breath, violent chills  . Sulfonamide Derivatives Shortness Of Breath    Shortness of breath, chills  . Cefdinir     Can't remember what rx had 2 yrs ago  . Copaxone [Glatiramer Acetate]     Past Surgical History:  Procedure Laterality Date  . ABDOMINAL HYSTERECTOMY     Fibroids  . arm surgery left    . HERNIA REPAIR    . TUBAL LIGATION    . WRIST  SURGERY      Social History  Substance Use Topics  . Smoking status: Former Smoker    Years: 37.00    Types: Cigarettes    Quit date: 02/26/2004  . Smokeless tobacco: Never Used  . Alcohol use 0.0 oz/week     Comment: occ    Family History  Problem Relation Age of Onset  . Emphysema Father   . Cancer Brother     bone  . Heart disease Brother   . Cancer Maternal Grandmother   . Heart attack Brother   . Cancer Maternal Grandfather   . Cancer Paternal Grandmother   . Cancer Paternal Grandfather     Medication list has been reviewed and updated.  Physical Examination:  Physical Exam  Constitutional: She is oriented to person, place, and time. She appears well-developed and well-nourished. No distress.  HENT:  Head: Normocephalic and atraumatic.  Right Ear: Hearing normal.  Left Ear: Hearing normal.  Nose: Nose normal.  Eyes: Conjunctivae and lids are normal. Right eye exhibits no discharge. Left eye exhibits no discharge. No scleral icterus.  Pulmonary/Chest: Effort normal. No respiratory distress.  Abdominal: Soft. Normal appearance. There is no tenderness. There is no CVA tenderness.  Musculoskeletal: Normal range of motion.  Neurological: She is alert and oriented to person, place, and time.  Skin: Skin is warm, dry and intact. No lesion and no rash noted.  Psychiatric: She has a normal mood and affect. Her speech is normal and behavior is normal. Thought content normal.   BP 116/74   Pulse 67   Temp 98.4 F (36.9 C) (Oral)   Resp 18   Ht 5\' 4"  (1.626 m)   Wt 159 lb 6.4 oz (72.3 kg)   SpO2 97%   BMI 27.36 kg/m   Results for orders placed or performed in visit on 11/30/15  POCT urinalysis dipstick  Result Value Ref Range   Color, UA yellow yellow   Clarity, UA cloudy (A) clear   Glucose, UA negative negative   Bilirubin, UA negative negative   Ketones, POC UA negative negative   Spec Grav, UA 1.010    Blood, UA negative negative   pH, UA 7.0     Protein Ur, POC negative negative   Urobilinogen, UA 0.2    Nitrite, UA Positive (A) Negative   Leukocytes, UA moderate (2+) (A) Negative  POCT Microscopic Urinalysis ([facility])  Result Value Ref Range   WBC,UR,HPF,POC Moderate (A) None WBC/hpf   RBC,UR,HPF,POC None None RBC/hpf   Bacteria Many (A) None, Too numerous to count   Mucus Absent Absent   Epithelial Cells, UR Per Microscopy Few (A) None, Too numerous to count cells/hpf    Assessment and Plan:  1. Dysuria 2. Cystitis  UA suggestive of UTI. Treat with fosfomycin d/t multiple drug  allergies and worked when took for last UTI. Last few urine cultures have had no growth. If this urine culture is without growth again, would suggest refer to urology after next UTI. - POCT urinalysis dipstick - POCT Microscopic Urinalysis ([facility]) - fosfomycin (MONUROL) 3 g PACK; Take 3 g by mouth once.  Dispense: 1 g; Refill: 0 - Urine culture   Benjaman Pott. Drenda Freeze, MHS [facility]  11/30/2015

## 2015-12-01 ENCOUNTER — Telehealth: Payer: Self-pay

## 2015-12-01 MED ORDER — CIPROFLOXACIN HCL 500 MG PO TABS: 500.0000 mg | ORAL_TABLET | Freq: Two times a day (BID) | ORAL | 0 refills | Status: DC

## 2015-12-01 MED FILL — OMEPRAZOLE DR 20 MG CAPSULE: 20 | 90 days supply | Qty: 90 | Fill #1

## 2015-12-01 MED FILL — CIPROFLOXACIN HCL 500 MG TA: 500 | 5 days supply | Qty: 10 | Fill #0

## 2015-12-01 NOTE — Telephone Encounter (Signed)
[facility] pharm called and could see Katie Maldonado's note but needed someone to give him the Rx verbally. Gave him Rx and put in [facility] for record.

## 2015-12-01 NOTE — Telephone Encounter (Signed)
She has several allergies. Cipro is not listed in [facility] as an allergy.  Lets call in CIpro 500 bid #[phone].  Philis Fendt, MS, PA-C 10:18 AM, 12/01/2015

## 2015-12-01 NOTE — Telephone Encounter (Signed)
PATIENT STATES SHE SAW [facility]ICO[facility] YESTERDAY FOR A UTI. SHE PRESCRIBED HER FOSFOMYCIN, BUT WHEN SHE WENT TO PICK IT UP THE PHARMACY WON'T HAVE IT AVAILABLE UNTIL Monday 12/04/2015. SHE DOES NOT WANT TO GO THE WHOLE WEEKEND WITH OUT SOME MEDICINE. SHE WOULD LIKE TO HAVE SOMETHING ELSE CALLED IN PLEASE. BEST PHONE [phone] (WORK UNTIL 3:00) OR CELL IS [phone] PHARMACY CHOICE IS [facility].  [place]

## 2015-12-03 LAB — URINE CULTURE: Colony Count: >=100000

## 2015-12-04 ENCOUNTER — Encounter: Payer: Self-pay | Admitting: Physical Therapy

## 2015-12-05 ENCOUNTER — Other Ambulatory Visit (INDEPENDENT_AMBULATORY_CARE_PROVIDER_SITE_OTHER): Payer: Self-pay

## 2015-12-05 DIAGNOSIS — R5382 Chronic fatigue, unspecified: Secondary | ICD-10-CM | POA: Diagnosis not present

## 2015-12-05 DIAGNOSIS — G35 Multiple sclerosis: Secondary | ICD-10-CM | POA: Diagnosis not present

## 2015-12-05 DIAGNOSIS — R200 Anesthesia of skin: Secondary | ICD-10-CM

## 2015-12-05 DIAGNOSIS — R208 Other disturbances of skin sensation: Secondary | ICD-10-CM | POA: Diagnosis not present

## 2015-12-05 DIAGNOSIS — Z0289 Encounter for other administrative examinations: Secondary | ICD-10-CM

## 2015-12-05 DIAGNOSIS — M791 Myalgia, unspecified site: Secondary | ICD-10-CM

## 2015-12-06 ENCOUNTER — Telehealth: Payer: Self-pay | Admitting: *Deleted

## 2015-12-06 LAB — CBC WITH DIFFERENTIAL/PLATELET
BASOS ABS: 0 10*3/uL (ref 0.0–0.2)
Basos: 0 %
EOS (ABSOLUTE): 0.1 10*3/uL (ref 0.0–0.4)
EOS: 1 %
HEMOGLOBIN: 13 g/dL (ref 11.1–15.9)
Hematocrit: 39.8 % (ref 34.0–46.6)
IMMATURE GRANS (ABS): 0 10*3/uL (ref 0.0–0.1)
Immature Granulocytes: 0 %
LYMPHS: 11 %
Lymphocytes Absolute: 0.8 10*3/uL (ref 0.7–3.1)
MCH: 28.7 pg (ref 26.6–33.0)
MCHC: 32.7 g/dL (ref 31.5–35.7)
MCV: 88 fL (ref 79–97)
Monocytes Absolute: 0.5 10*3/uL (ref 0.1–0.9)
Monocytes: 8 %
NEUTROS ABS: 5.6 10*3/uL (ref 1.4–7.0)
Neutrophils: 80 %
Platelets: 243 10*3/uL (ref 150–379)
RBC: 4.53 x10E6/uL (ref 3.77–5.28)
RDW: 14.5 % (ref 12.3–15.4)
WBC: 7 10*3/uL (ref 3.4–10.8)

## 2015-12-06 NOTE — Telephone Encounter (Signed)
Per Dr Leta Baptist, LVM informing patient her lab results are unremarkable, no change in her current treatment plan. Left name, number, advised to call as needed, and will see her in Nov for FU.

## 2015-12-25 ENCOUNTER — Ambulatory Visit (INDEPENDENT_AMBULATORY_CARE_PROVIDER_SITE_OTHER): Payer: 59 | Admitting: Physician Assistant

## 2015-12-25 VITALS — BP 122/70 | HR 63 | Temp 99.1°F | Resp 16 | Ht 64.0 in | Wt 161.4 lb

## 2015-12-25 DIAGNOSIS — S60450A Superficial foreign body of right index finger, initial encounter: Secondary | ICD-10-CM | POA: Diagnosis not present

## 2015-12-25 DIAGNOSIS — T148XXA Other injury of unspecified body region, initial encounter: Secondary | ICD-10-CM

## 2015-12-25 NOTE — Patient Instructions (Addendum)
Please continue to wash the finger twice per day. Please return if symptoms worsen. Sliver Removal, Care After A sliver--also called a splinter--is a small and thin broken piece of an object that gets stuck (embedded) under the skin. A sliver can create a deep wound that can easily become infected. It is important to care for the wound after a sliver is removed to help prevent infection and other problems from developing. WHAT TO EXPECT AFTER THE PROCEDURE Slivers often break into smaller pieces when they are removed. If pieces of your sliver broke off and stayed in your skin, you will eventually see them working themselves out and you may feel some pain at the wound site. This is normal. HOME CARE INSTRUCTIONS  Keep all follow-up visits as directed by your health care provider. This is important.  There are many different ways to close and cover a wound, including stitches (sutures) and adhesive strips. Follow your health care provider's instructions about:  Wound care.  Bandage (dressing) changes and removal.  Wound closure removal.  Check the wound site every day for signs of infection. Watch for:  Red streaks coming from the wound.  Fever.  Redness or tenderness around the wound.  Fluid, blood, or pus coming from the wound.  A bad smell coming from the wound. SEEK MEDICAL CARE IF:  You think that a piece of the sliver is still in your skin.  Your wound was closed, as with sutures, and the edges of the wound break open.  You have signs of infection, including:  New or worsening redness around the wound.  New or worsening tenderness around the wound.  Fluid, blood, or pus coming from the wound.  A bad smell coming from the wound or dressing. SEEK IMMEDIATE MEDICAL CARE IF: You have any of the following signs of infection:  Red streaks coming from the wound.  An unexplained fever.   This information is not intended to replace advice given to you by your health care  provider. Make sure you discuss any questions you have with your health care provider.   Document Released: 04/12/2000 Document Revised: 05/06/2014 Document Reviewed: 12/16/2013 Elsevier Interactive Patient Education 2016 [employer].      IF you received an x-ray today, you will receive an invoice from [facility]. Please contact [facility] at [phone] with questions or concerns regarding your invoice.   IF you received labwork today, you will receive an invoice from [employer]. Please contact [facility] at [phone] with questions or concerns regarding your invoice.   Our billing staff will not be able to assist you with questions regarding bills from these companies.  You will be contacted with the lab results as soon as they are available. The fastest way to get your results is to activate your My Chart account. Instructions are located on the last page of this paperwork. If you have not heard from [place] regarding the results in 2 weeks, please contact this office.

## 2015-12-25 NOTE — Progress Notes (Signed)
Patient ID: [name], female   DOB: 04/22/1951, 65 y.o.   MRN: [id number] [facility] [address], [place] [address] [phone]  Date:  12/25/2015   Name:  Katie Maldonado   DOB:  March 20, 1951   MRN:  [id number]  PCP:  Elby Beck, FNP   By signing my name below, I, Katie Maldonado, attest that this documentation has been prepared under the direction and in the presence of Ivar Drape, PA-C Electronically Signed: Ladene Maldonado, ED Scribe 12/25/2015 at 6:44 PM.  History of Present Illness:  Katie Maldonado is a 65 y.o. female patient who presents to [facility] complaining of a splinter in the right index finger sustained yesterday. Pt states that she was trying to remove paint from a swing yesterday when a piece of wood punctured her right index finger. Her husband tried to remove the splinter but was unsuccessful. She reports a throbbing sensation surrounding the splinter that is exacerbated with palpation. Pt has taken ibuprofen with temporary relief.    Patient Active Problem List   Diagnosis Date Noted   Degenerative cervical disc 10/04/2014   [place] arthritis 08/19/2014   Low back pain 11/26/2013   Numbness and tingling in left arm 07/09/2013   Anxiety state, unspecified 12/30/2012   Other malaise and fatigue 12/30/2012   Osteoporosis 11/13/2012   Osteoarthritis of multiple joints 04/18/2011   Incontinence of urine 04/18/2011   HYPERLIPIDEMIA-MIXED 01/08/2010   MULTIPLE SCLEROSIS 01/08/2010    Past Medical History:  Diagnosis Date   Allergy    Anxiety    Arthritis    Hypersomnolence    Migraine    Multiple sclerosis (HCC)    Neuromuscular disorder (HCC)    Obesity, unspecified    Osteoporosis    Other and unspecified hyperlipidemia    TIA (transient ischemic attack) 20    Past Surgical History:  Procedure Laterality Date   ABDOMINAL HYSTERECTOMY     Fibroids   arm surgery left     HERNIA  REPAIR     TUBAL LIGATION     WRIST SURGERY      Social History  Substance Use Topics   Smoking status: Former Smoker    Years: 37.00    Types: Cigarettes    Quit date: 02/26/2004   Smokeless tobacco: Never Used   Alcohol use 0.0 oz/week     Comment: occ    Family History  Problem Relation Age of Onset   Emphysema Father    Cancer Brother     bone   Heart disease Brother    Cancer Maternal Grandmother    Heart attack Brother    Cancer Maternal Grandfather    Cancer Paternal Grandmother    Cancer Paternal Grandfather     Allergies  Allergen Reactions   Nitrofurantoin Shortness Of Breath    Shortness of breath, violent chills   Sulfonamide Derivatives Shortness Of Breath    Shortness of breath, chills   Cefdinir     Can't remember what rx had 2 yrs ago   Copaxone [Glatiramer Acetate]     Medication list has been reviewed and updated.  Current Outpatient Prescriptions on File Prior to Visit  Medication Sig Dispense Refill   alendronate (FOSAMAX) 70 MG tablet Take 1 tablet (70 mg total) by mouth once a week. Take with a full glass of water on an empty stomach. 12 tablet 3   aspirin EC 81 MG tablet Take 1 tablet (81 mg total)  by mouth daily.     cholecalciferol (VITAMIN D) 1000 UNITS tablet Take 1,000 Units by mouth daily. Reported on 08/08/2015     citalopram (CELEXA) 20 MG tablet Take 1 tablet (20 mg total) by mouth daily. 90 tablet 1   Dimethyl Fumarate (TECFIDERA) 240 MG CPDR Take 240 mg by mouth 2 (two) times daily.     ibuprofen (ADVIL,MOTRIN) 200 MG tablet Take 2 tablets (400 mg total) by mouth 4 (four) times daily.     mirabegron ER (MYRBETRIQ) 50 MG TB24 tablet Take 50 mg by mouth daily.     Omega-3 Fatty Acids (FISH OIL) 1000 MG CAPS Take 1,000 mg by mouth 2 (two) times daily.   0   omeprazole (PRILOSEC) 20 MG capsule TAKE 1 CAPSULE BY MOUTH ONCE DAILY 90 capsule 1   ondansetron (ZOFRAN-ODT) 4 MG disintegrating tablet Take 1 tablet  (4 mg total) by mouth every 8 (eight) hours as needed for nausea or vomiting. 60 tablet 2   TURMERIC PO Take 1 tablet by mouth daily.     Current Facility-Administered Medications on File Prior to Visit  Medication Dose Route Frequency Provider Last Rate Last Dose   gadopentetate dimeglumine (MAGNEVIST) injection 15 mL  15 mL Intravenous Once PRN Penni Bombard, MD       gadopentetate dimeglumine (MAGNEVIST) injection 16 mL  16 mL Intravenous Once PRN Penni Bombard, MD        Review of Systems  Skin:       +Foreign body to R index finger  ROS otherwise unremarkable unless listed above.  Physical Examination: BP 122/70 (BP Location: Right Arm, Patient Position: Sitting, Cuff Size: Normal)    Pulse 63    Temp 99.1 F (37.3 C) (Oral)    Resp 16    Ht 5\' 4"  (1.626 m)    Wt 161 lb 6.4 oz (73.2 kg)    SpO2 98%    BMI 27.70 kg/m   Physical Exam  Constitutional: She is oriented to person, place, and time. She appears well-developed and well-nourished. No distress.  HENT:  Head: Normocephalic and atraumatic.  Right Ear: External ear normal.  Left Ear: External ear normal.  Eyes: Conjunctivae and EOM are normal. Pupils are equal, round, and reactive to light.  Cardiovascular: Normal rate.   Pulmonary/Chest: Effort normal. No respiratory distress.  Neurological: She is alert and oriented to person, place, and time.  Skin: She is not diaphoretic.  R index finger at the radial aspect on the lateral side there is mild tenderness. There is some dried material along the cuticle. Foreign body is visible with lifting the nail up.   Psychiatric: She has a normal mood and affect. Her behavior is normal.   Procedure: verbal consent obtained.  Alcohol swabbing.  Cleansed with soap and water.  Splinter tool utilized to remove a small splinter at the corner of nail bed.  Nail was swept through this area, and all foreign body removed.  Washed with soap and water.  Bandage applied.  Assessment  and Plan: Katie Maldonado is a 65 y.o. female who is here today for cc of splinter. This was removed without.  Advised to return for alarming symptoms discussed.  She voiced understanding.  Splinter  Ivar Drape, PA-C [facility] 12/25/2015 6:36 PM

## 2015-12-27 MED FILL — MYRBETRIQ ER 50 MG TABLET: 50 | 30 days supply | Qty: 30 | Fill #3

## 2016-01-12 MED FILL — ONDANSETRON ODT 4 MG TABLET: 4 | 20 days supply | Qty: 60 | Fill #0

## 2016-02-02 ENCOUNTER — Telehealth: Payer: Self-pay | Admitting: *Deleted

## 2016-02-02 NOTE — Telephone Encounter (Signed)
[employer] FMLA papers on Dr Penumalli's desk for review, signature. 

## 2016-02-05 ENCOUNTER — Telehealth: Payer: Self-pay | Admitting: *Deleted

## 2016-02-05 NOTE — Telephone Encounter (Signed)
[employer]ML[employer] papers completed; sent to [facility] for processing.

## 2016-02-06 DIAGNOSIS — Z0289 Encounter for other administrative examinations: Secondary | ICD-10-CM

## 2016-02-08 MED FILL — MYRBETRIQ ER 50 MG TABLET: 50 | 30 days supply | Qty: 30 | Fill #4

## 2016-02-08 MED FILL — ONDANSETRON ODT 4 MG TABLET: 4 | 20 days supply | Qty: 60 | Fill #1

## 2016-02-21 ENCOUNTER — Telehealth: Payer: Self-pay | Admitting: Rheumatology

## 2016-02-21 NOTE — Telephone Encounter (Signed)
Patient would like appt with Dr. Estanislado Pandy and she was last seen May 2014. She has not seen another rheumatologist. Can we schedule or does she need a referral?

## 2016-02-21 NOTE — Telephone Encounter (Signed)
Yes ok to schedule she has OA hands.

## 2016-03-06 ENCOUNTER — Ambulatory Visit (INDEPENDENT_AMBULATORY_CARE_PROVIDER_SITE_OTHER): Payer: 59 | Admitting: Diagnostic Neuroimaging

## 2016-03-06 ENCOUNTER — Encounter: Payer: Self-pay | Admitting: Diagnostic Neuroimaging

## 2016-03-06 VITALS — BP 110/78 | HR 64 | Wt 159.4 lb

## 2016-03-06 DIAGNOSIS — R5383 Other fatigue: Secondary | ICD-10-CM

## 2016-03-06 DIAGNOSIS — G35 Multiple sclerosis: Secondary | ICD-10-CM

## 2016-03-06 DIAGNOSIS — R41840 Attention and concentration deficit: Secondary | ICD-10-CM

## 2016-03-06 MED ORDER — AMPHETAMINE-DEXTROAMPHET ER 10 MG PO CP24: 10.0000 mg | ORAL_CAPSULE | Freq: Every day | ORAL | 0 refills | Status: DC

## 2016-03-06 NOTE — Progress Notes (Signed)
PATIENT: Katie Maldonado DOB: [birth year]   REASON FOR VISIT: follow up for MS HISTORY FROM: patient  Chief Complaint  Patient presents with  . Multiple Sclerosis    rm 6, "problems with fatigue, getting worse; focusing- dozing off at work"  . Follow-up    6 month     HISTORY OF PRESENT ILLNESS:  UPDATE 03/06/16: Since last visit, continues with fatigue, poor attention, difficulty at work. She is in corrective action due to errors. Denies depression, but has excessive daytime fatigue.   UPDATE 08/31/15: Since last visit continues with fatigue, pain, numbness. Tolerating tecfidera. Has tried PT. Urinary issues better. Now recovering from right arm celluitis (was painting, developed blister, then got infected; now better on oral antibiotics).  UPDATE 07/07/15: Since last visit, still with left neck pain radiating to the left arm. Numb/tingling. Has h/o left forearm/wrist fracture (2005 and 2010; s/p surgery). Amantadine not helping with fatigue. Denies depression.   UPDATE 05/31/15: Since last visit, sxs progressing since Oct 2016. More fatigue, more bladder/bowel issues. More left arm numbness.  UPDATE 11/28/14: Since last visit, continues to have prickly, tingling sensation in whole body, hot flash sensations, pain, neck issues. Had 2 falls, but no injuries.  UPDATE 08/23/14: Since last visit, has been on tecfidera since beginning March 2016. She has noted more general numbness, fatigue, constipation, drawstring sensation in calves/hamstrings in past 3 weeks.   UPDATE 05/18/14: MS symptoms stable. Has had 2 reactions to copaxone, leading to tinging, prickly sensations over whole body. 1 event led to throat tightness, tongue feeling thick, and worried patient. Here to discuss possibilities of switching therapies.  UPDATE 01/04/14 (VRP): Since last visit, doing the same. No new neuro symptoms. Does report persistent fatigue, weight gain, incr appetite, feeling hot. Tolerating copaxone.  Misses 2-3 injections per month.   UPDATE 06/30/13 (LL):  Since last visit patient continues to have problems with fatigue and malaise, numbness and paresthesias in left arm which is new for her, started a couple weeks ago.  She states it is constant, does not hurt, but annoying.  She is still able to work.  Follow up MRI brain at last visit showed no acute or new lesions, with moderate cerebral atrophy.  UPDATE 12/30/12 (VP): Since last visit patient continues to have problems with fatigue, insomnia, anxiety, shortness of breath. For past 6 months she's been having dyspnea on exertion especially climbing steps at her work. She has been evaluated by PCP without specific diagnosis. Patient continues on Copaxone (now 3 times per week dosing). Last MRI brain was in 2011. Regarding fatigue she has been on Provigil and Ritalin in the past without relief. Regarding anxiety she was on Paxil in the past but this caused nausea and stomach problems.   PRIOR HPI (05/02/12, Dr. 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 Her MRI of brain 06/10/1999 showed white matter abnormalities not typical for MS and MRI of the cervical spine without contrast 07/27/99 showed a focal area of abnormality in the spinal cord at C2-3. She had positive CSF IgG index and oligoclonal banding. Initially the VER was normal with subsequent VERs 12/04/1999, 12/10/1999, and 02/01/2000 abnormal in the right eye. She began Avonex which she took for one year and switched to Rebif beginning 08/2000. She was then switched to Betaseron 04/2001 because her insurance would not pay for Rebif, but was subsequently switched back to Rebif  04/2003. Because of elevated liver function tests she was switched to Copaxone 07/03/2005.She has felt better on Copaxone then on the interferons. Her visual acuity varies from 20/30 to 20/40 in the left eye and to 20/200  in the right. She had burning paresthesias treated with gabapentin and at times amitriptyline. NMO blood test was negative 02/21/2005. She has been followed Dr. Estanislado Pandy, rheumatologist for the question of RA versus sarcoidosis and by Dr.Clinton Young for hypersomnolence without a diagnosis of narcolepsy or obstructive Sleep apnea. MRI studies of the brain and cervical spine 02/08/2010 showed periventricular lesions and unchanged lesion at C2-3. CBC and CMP 03/17/2009 were normal. CBC, CMP, TSH, lipid profile except LDL 151 were normal 08/07/09.vitamin D level 10/16/2009 was 41. She complains of fatigue and her right foot turning in an occasional foot cramping. She fell 10/2009 fracturing her right foot in a [employer] parking lot. In 2010 she fractured her left foot, left wrist, and her left arm. She has continued swelling in her right foot and lower leg. She has an overactive bladder followed by Dr.Dalhstedt. She has a history of Lhermitte's sign worse with neck extension but also present with flexion. She had numbness on the right side of her body except her face and head for one month.Her bladder symptoms improved on enablex.She walks 1.8 miles 3 times per week. 03/05/2011 she was lying in bed asleep and awoke trying to sit up and developed dizziness on 2 different occasions. She fell back in to the bed and went to sleep. On the third episode she became concerned. The episodes of dizziness would last seconds and are described as spinning. She had dizziness during the day without spinning She did not have head trauma or a cold. She was seen by Dr. Elder Cyphers at urgent care and underwent blood studies, EKG, and urinalysis. She has a history of migraine and a history of motion sickness. She has a Lhermitte's sign when she turns her head to the left with discomfort in her neck going into her left arm. This can occur 2 times per day and lasts Less than 1 minute. She has bladder symptoms and right flank pain and is concerned  about another urinary tract infection. She can awaken with snoring when she has a cold. She sleeps well. She has dizziness 2 or 3 times per week when she lies down and turns her head to the left.    REVIEW OF SYSTEMS: Full 14 system review of systems performed and negative except: fatigue constipation heat intolerance freq waking cramps neck pain bladder incont.    ALLERGIES: Allergies  Allergen Reactions  . Nitrofurantoin Shortness Of Breath    Shortness of breath, violent chills, dizziness, nausea  . Sulfonamide Derivatives Shortness Of Breath    Shortness of breath, chills  . Cefdinir     Can't remember what rx had 2 yrs ago  . Copaxone [Glatiramer Acetate]     HOME MEDICATIONS: Outpatient Medications Prior to Visit  Medication Sig Dispense Refill  . alendronate (FOSAMAX) 70 MG tablet Take 1 tablet (70 mg total) by mouth once a week. Take with a full glass of water on an empty stomach. 12 tablet 3  . aspirin EC 81 MG tablet Take 1 tablet (81 mg total) by mouth daily.    . cholecalciferol (VITAMIN D) 1000 UNITS tablet Take 1,000 Units by mouth daily. Reported on 08/08/2015    . citalopram (CELEXA) 20 MG tablet Take 1 tablet (20 mg total) by mouth daily. 90 tablet 1  .  Dimethyl Fumarate (TECFIDERA) 240 MG CPDR Take 240 mg by mouth 2 (two) times daily.    Marland Kitchen ibuprofen (ADVIL,MOTRIN) 200 MG tablet Take 2 tablets (400 mg total) by mouth 4 (four) times daily.    . mirabegron ER (MYRBETRIQ) 50 MG TB24 tablet Take 50 mg by mouth daily.    . Omega-3 Fatty Acids (FISH OIL) 1000 MG CAPS Take 1,000 mg by mouth 2 (two) times daily.   0  . omeprazole (PRILOSEC) 20 MG capsule TAKE 1 CAPSULE BY MOUTH ONCE DAILY 90 capsule 1  . ondansetron (ZOFRAN-ODT) 4 MG disintegrating tablet Take 1 tablet (4 mg total) by mouth every 8 (eight) hours as needed for nausea or vomiting. 60 tablet 2  . TURMERIC PO Take 1 tablet by mouth daily.     Facility-Administered Medications Prior to Visit  Medication Dose  Route Frequency Provider Last Rate Last Dose  . gadopentetate dimeglumine (MAGNEVIST) injection 15 mL  15 mL Intravenous Once PRN Penni Bombard, MD      . gadopentetate dimeglumine (MAGNEVIST) injection 16 mL  16 mL Intravenous Once PRN Penni Bombard, MD       PHYSICAL EXAM  Vitals:   03/06/16 1532  BP: 110/78  Pulse: 64  Weight: 159 lb 6.4 oz (72.3 kg)   Body mass index is 27.36 kg/m.  GENERAL EXAM/CONSTITUTIONAL: Vitals:  Vitals:   03/06/16 1532  BP: 110/78  Pulse: 64  Weight: 159 lb 6.4 oz (72.3 kg)    Patient is in no distress; well developed, nourished and groomed; neck is supple  CARDIOVASCULAR:  Examination of carotid arteries is normal; no carotid bruits  Regular rate and rhythm, no murmurs  Examination of peripheral vascular system by observation and palpation is normal  EYES:  Ophthalmoscopic exam of optic discs and posterior segments is normal; no papilledema or hemorrhages  MUSCULOSKELETAL:  Gait, strength, tone, movements noted in Neurologic exam below  NEUROLOGIC: MENTAL STATUS:   awake, alert, oriented to person, place and time  recent and remote memory intact  normal attention and concentration  language fluent, comprehension intact, naming intact,   fund of knowledge appropriate  CRANIAL NERVE:   2nd - no papilledema on fundoscopic exam  2nd, 3rd, 4th, 6th - pupils equal and reactive to light, visual fields full to confrontation, extraocular muscles intact, no nystagmus  5th - facial sensation symmetric  7th - facial strength symmetric  8th - hearing intact  9th - palate elevates symmetrically, uvula midline  11th - shoulder shrug symmetric  12th - tongue protrusion midline  MOTOR:   normal bulk and tone, full strength in the BUE, BLE   AT BASELINE LEFT SHOULDER LOWER THAN RIGHT  SENSORY:   normal and symmetric to light touch, temperature, vibration  COORDINATION:   finger-nose-finger, fine finger  movements normal  REFLEXES:   deep tendon reflexes present and symmetric  GAIT/STATION:   narrow based gait; SLIGHT UNSTEADY TURNING; romberg is negative    DIAGNOSTIC DATA (LABS, IMAGING, TESTING) - I reviewed patient records, labs, notes, testing and imaging myself where available.  Lab Results  Component Value Date   WBC 7.0 12/05/2015   HGB 12.7 09/18/2015   HCT 39.8 12/05/2015   MCV 88 12/05/2015   PLT 243 12/05/2015   Lymphocytes Absolute  Date Value Ref Range Status  12/05/2015 0.8 0.7 - 3.1 x10E3/uL Final  08/31/2015 0.6 (L) 0.7 - 3.1 x10E3/uL Final  08/23/2014 2.3 0.7 - 3.1 x10E3/uL Final   CMP Latest Ref Rng &  Units 08/31/2015 02/21/2015 08/23/2014  Glucose 65 - 99 mg/dL 83 77 84  BUN 8 - 27 mg/dL 15 15 14   Creatinine 0.57 - 1.00 mg/dL 0.71 0.67 0.84  Sodium 134 - 144 mmol/L 139 138 138  Potassium 3.5 - 5.2 mmol/L 4.6 3.8 5.7(H)  Chloride 96 - 106 mmol/L 99 99 98  CO2 18 - 29 mmol/L 24 27 24   Calcium 8.7 - 10.3 mg/dL 9.1 9.4 10.0  Total Protein 6.0 - 8.5 g/dL 6.6 7.2 7.6  Total Bilirubin 0.0 - 1.2 mg/dL 0.4 0.8 0.4  Alkaline Phos 39 - 117 IU/L 81 70 77  AST 0 - 40 IU/L 15 20 17   ALT 0 - 32 IU/L 13 19 17    Lab Results  Component Value Date   TSH 1.425 02/21/2015    08/31/14 MRI brain - showing T2/flair hyperintense foci in the periventricular, deep and subcortical matter in a pattern and configuration consistent with the diagnosis of multiple sclerosis. No acute foci are noted. Generalized cortical atrophy is noted. When compared to a study dated 05/26/2014, there is no interval change.  08/31/14 MRI cervical spine -  1. Severe degenerative changes at C4-C5, C5-C6 and C6-C7 with there is disc protrusion, uncovertebral spurring and facet hypertrophy. There is mild spinal stenosis at each of these 3 levels. There is also moderately severe foraminal narrowing which could lead to impingement of the exiting C5, C6 and C7 nerve roots. 2. Degenerative changes are  milder at C2-C3, C3-C4 and C7-T1 and are less likely to lead to nerve root impingement.  3. The spinal cord appears normal.  4. There are no enhancing abnormalities.  5. Compared to the MRI of the cervical spine dated 07/07/2013, there is no definite interval change.   06/14/15 MRI brain  1. Mild periventricular and subcortical and infratentorial chronic demyelinating plaques.  2. No abnormal lesions are seen on post contrast views.  3. No change from MRI on 08/31/14.   06/28/15 MRI cervical  - severe degenerative changes at C4-5, C5-6 and C6-7 resulting in broad-based disc protrusions, uncovertebral spurring and facet hypertrophy resulting in mild canal and moderate bilateral foraminal narrowing with likely encroachment on the exiting C5, C6 and C7 nerve roots. No enhancing or demyelinating lesions are noted. Overall no significant change compared with previous MRI scan dated 08/31/2014.  08/31/15 anti-JCV antibody - positive    ASSESSMENT AND PLAN  65 y.o. female here with multiple sclerosis since 2001, on copaxone since 2007. Previously on avonex and betaseron.  Then apparent intolerance to copaxone with 2 events of tingling, throat tightness, thick tongue with 2 injections. Now on tecfidera since March 2016. Some progression of symptoms since Oct 2016, but MRI scans are stable.  Left neck and arm symptoms likely related to cervical radiculopathies. Will try conservative treatment options.  Tried gabapentin for numbness --> stopped due to sleepiness.  Tried ritalin, provigil, amantadine for fatigue --> stopped due to in-effectiveness.    Dx:  Multiple sclerosis ([place])  Other fatigue  Attention and concentration deficit    DEPRESSION SCREENING Depression screen [facility] 2/9 03/06/2016 12/25/2015 11/30/2015  Decreased Interest 2 0 0  Down, Depressed, Hopeless - 0 0  PHQ - 2 Score 2 0 0    PLAN: - continue tecfidera - continue gentle yoga classes - check labs - refer to  psychology for depression evaluation - refer to sleep study for excessive daytime fatigue - trial of adderall for fatigue and poor attention  Orders Placed This Encounter  Procedures  . Ambulatory referral to Sleep Studies   Meds ordered this encounter  Medications  . amphetamine-dextroamphetamine (ADDERALL XR) 10 MG 24 hr capsule    Sig: Take 1 capsule (10 mg total) by mouth daily.    Dispense:  30 capsule    Refill:  0   Return in about 3 months (around 06/06/2016).    Penni Bombard, MD [id number], [id number] PM Certified in Neurology, Neurophysiology and Neuroimaging  [facility] [address], [place], [place] [address] [phone]

## 2016-03-07 ENCOUNTER — Ambulatory Visit: Payer: 59 | Admitting: Diagnostic Neuroimaging

## 2016-03-07 ENCOUNTER — Other Ambulatory Visit: Payer: Self-pay | Admitting: Family Medicine

## 2016-03-07 MED FILL — MYRBETRIQ ER 50 MG TABLET: 50 | 30 days supply | Qty: 30 | Fill #5

## 2016-03-07 MED FILL — DEXTROAMP-AMPHET ER 10 MG C: 10 | 30 days supply | Qty: 30 | Fill #0

## 2016-03-08 ENCOUNTER — Encounter: Payer: Self-pay | Admitting: Diagnostic Neuroimaging

## 2016-03-09 DIAGNOSIS — M170 Bilateral primary osteoarthritis of knee: Secondary | ICD-10-CM | POA: Insufficient documentation

## 2016-03-09 DIAGNOSIS — M19071 Primary osteoarthritis, right ankle and foot: Secondary | ICD-10-CM | POA: Insufficient documentation

## 2016-03-09 DIAGNOSIS — M19072 Primary osteoarthritis, left ankle and foot: Secondary | ICD-10-CM

## 2016-03-12 ENCOUNTER — Encounter: Payer: Self-pay | Admitting: Rheumatology

## 2016-03-12 ENCOUNTER — Ambulatory Visit (INDEPENDENT_AMBULATORY_CARE_PROVIDER_SITE_OTHER): Payer: 59 | Admitting: Rheumatology

## 2016-03-12 ENCOUNTER — Telehealth: Payer: Self-pay | Admitting: Pharmacist

## 2016-03-12 ENCOUNTER — Ambulatory Visit (INDEPENDENT_AMBULATORY_CARE_PROVIDER_SITE_OTHER): Payer: Self-pay

## 2016-03-12 VITALS — BP 107/64 | HR 87 | Resp 14 | Ht 64.0 in | Wt 161.0 lb

## 2016-03-12 DIAGNOSIS — M19071 Primary osteoarthritis, right ankle and foot: Secondary | ICD-10-CM

## 2016-03-12 DIAGNOSIS — K5792 Diverticulitis of intestine, part unspecified, without perforation or abscess without bleeding: Secondary | ICD-10-CM

## 2016-03-12 DIAGNOSIS — M810 Age-related osteoporosis without current pathological fracture: Secondary | ICD-10-CM

## 2016-03-12 DIAGNOSIS — M19041 Primary osteoarthritis, right hand: Secondary | ICD-10-CM

## 2016-03-12 DIAGNOSIS — M19042 Primary osteoarthritis, left hand: Secondary | ICD-10-CM | POA: Diagnosis not present

## 2016-03-12 DIAGNOSIS — M170 Bilateral primary osteoarthritis of knee: Secondary | ICD-10-CM | POA: Diagnosis not present

## 2016-03-12 DIAGNOSIS — M5030 Other cervical disc degeneration, unspecified cervical region: Secondary | ICD-10-CM | POA: Diagnosis not present

## 2016-03-12 DIAGNOSIS — M7062 Trochanteric bursitis, left hip: Secondary | ICD-10-CM | POA: Diagnosis not present

## 2016-03-12 DIAGNOSIS — M19072 Primary osteoarthritis, left ankle and foot: Secondary | ICD-10-CM

## 2016-03-12 DIAGNOSIS — G35 Multiple sclerosis: Secondary | ICD-10-CM | POA: Diagnosis not present

## 2016-03-12 DIAGNOSIS — M545 Low back pain: Secondary | ICD-10-CM | POA: Diagnosis not present

## 2016-03-12 LAB — COMPLETE METABOLIC PANEL WITH GFR
ALBUMIN: 4.4 g/dL (ref 3.6–5.1)
ALK PHOS: 70 U/L (ref 33–130)
ALT: 16 U/L (ref 6–29)
AST: 20 U/L (ref 10–35)
BILIRUBIN TOTAL: 0.4 mg/dL (ref 0.2–1.2)
BUN: 12 mg/dL (ref 7–25)
CALCIUM: 9.2 mg/dL (ref 8.6–10.4)
CHLORIDE: 103 mmol/L (ref 98–110)
CO2: 28 mmol/L (ref 20–31)
CREATININE: 0.75 mg/dL (ref 0.50–0.99)
GFR, Est Non African American: 85 mL/min (ref >=60)
Glucose, Bld: 108 mg/dL — ABNORMAL HIGH (ref 65–99)
Potassium: 4.6 mmol/L (ref 3.5–5.3)
Sodium: 139 mmol/L (ref 135–146)
TOTAL PROTEIN: 6.9 g/dL (ref 6.1–8.1)

## 2016-03-12 LAB — CBC WITH DIFFERENTIAL/PLATELET
BASOS PCT: 0 %
Basophils Absolute: 0 cells/uL (ref 0–200)
EOS ABS: 86 {cells}/uL (ref 15–500)
Eosinophils Relative: 2 %
HEMATOCRIT: 36.5 % (ref 35.0–45.0)
HEMOGLOBIN: 11.9 g/dL (ref 11.7–15.5)
LYMPHS ABS: 559 {cells}/uL — AB (ref 850–3900)
LYMPHS PCT: 13 %
MCH: 28.1 pg (ref 27.0–33.0)
MCHC: 32.6 g/dL (ref 32.0–36.0)
MCV: 86.1 fL (ref 80.0–100.0)
MONO ABS: 430 {cells}/uL (ref 200–950)
MPV: 9.6 fL (ref 7.5–12.5)
Monocytes Relative: 10 %
Neutro Abs: 3225 cells/uL (ref 1500–7800)
Neutrophils Relative %: 75 %
Platelets: 261 10*3/uL (ref 140–400)
RBC: 4.24 MIL/uL (ref 3.80–5.10)
RDW: 13.6 % (ref 11.0–15.0)
WBC: 4.3 10*3/uL (ref 3.8–10.8)

## 2016-03-12 LAB — TSH: TSH: 2.46 m[IU]/L

## 2016-03-12 MED ORDER — LIDOCAINE HCL 1 % IJ SOLN
1.5000 mL | INTRAMUSCULAR | Status: AC | PRN
  Administered 2016-03-12: 1.5 mL

## 2016-03-12 MED ORDER — TRIAMCINOLONE ACETONIDE 40 MG/ML IJ SUSP
40.0000 mg | INTRAMUSCULAR | Status: AC | PRN
  Administered 2016-03-12: 40 mg via INTRA_ARTICULAR

## 2016-03-12 NOTE — Progress Notes (Signed)
Office Visit Note  Patient: Katie Maldonado             Date of Birth: 1950/10/23           MRN: [id number]             PCP: Elby Beck, FNP Referring: Elby Beck, FNP Visit Date: 03/12/2016 Occupation: Medical records tech    Subjective:  Lower back pain  History of Present Illness: Katie Maldonado is a 65 y.o. female with history of osteoarthritis. She returns today after her last visit in May 2014. She states that she's been having a lot of lower back pain and left hip pain. The pain from the left hip radiates down into her left lower extremity. She is also having discomfort in multiple joints which involves her hands, her feet, bilateral knee joints and bilateral hips. She continues to have discomfort in her C-spine. She was seen by neurologist for C-spine pain and had MRI last year which showed severe disc disease of C-spine. She also had a bone density by her PCP last year which showed severe osteoporosis . She was placed on Fosamax. She's been tolerating Fosamax well.  Activities of Daily Living:  Patient reports morning stiffness for 2 hours.   Patient Reports nocturnal pain.  Difficulty dressing/grooming: Denies Difficulty climbing stairs: Reports Difficulty getting out of chair: Reports Difficulty using hands for taps, buttons, cutlery, and/or writing: Reports   Review of Systems  Constitutional: Positive for fatigue, weight gain and weakness. Negative for night sweats and weight loss.  HENT: Negative for mouth sores, trouble swallowing, trouble swallowing, mouth dryness and nose dryness.   Eyes: Negative for pain, redness, visual disturbance and dryness.  Respiratory: Negative for cough, shortness of breath and difficulty breathing.   Cardiovascular: Negative for chest pain, palpitations, hypertension, irregular heartbeat and swelling in legs/feet.  Gastrointestinal: Negative for blood in stool, constipation and diarrhea.  Endocrine: Negative  for increased urination.  Genitourinary: Negative for vaginal dryness.  Musculoskeletal: Positive for arthralgias, joint pain and morning stiffness. Negative for joint swelling, myalgias, muscle weakness, muscle tenderness and myalgias.  Skin: Negative for color change, rash, hair loss, skin tightness, ulcers and sensitivity to sunlight.  Allergic/Immunologic: Negative for susceptible to infections.  Neurological: Positive for numbness and parasthesias. Negative for dizziness, memory loss and night sweats.  Hematological: Negative for swollen glands.  Psychiatric/Behavioral: Negative for depressed mood and sleep disturbance. The patient is nervous/anxious.     PMFS History:  Patient Active Problem List   Diagnosis Date Noted  . Trochanteric bursitis of left hip 03/12/2016  . Primary osteoarthritis of both feet 03/09/2016  . Primary osteoarthritis of both knees 03/09/2016  . Degenerative cervical disc 10/04/2014  . Primary osteoarthritis of both hands 08/19/2014  . Low back pain 11/26/2013  . Numbness and tingling in left arm 07/09/2013  . Anxiety state, unspecified 12/30/2012  . Other malaise and fatigue 12/30/2012  . Osteoporosis 11/13/2012  . Incontinence of urine 04/18/2011  . HYPERLIPIDEMIA-MIXED 01/08/2010  . MULTIPLE SCLEROSIS 01/08/2010    Past Medical History:  Diagnosis Date  . Allergy   . Anxiety   . Arthritis   . Diverticulitis   . Diverticulosis 10/2015  . Hypersomnolence   . Migraine   . Multiple sclerosis ([place])   . Neuromuscular disorder ([place])   . Obesity, unspecified   . Osteoporosis   . Other and unspecified hyperlipidemia   . TIA (transient ischemic attack) 22    Family History  Problem Relation Age of Onset  . Emphysema Father   . Cancer Brother     bone  . Heart disease Brother   . Cancer Maternal Grandmother   . Heart attack Brother   . Cancer Maternal Grandfather   . Cancer Paternal Grandmother   . Cancer Paternal Grandfather    Past  Surgical History:  Procedure Laterality Date  . ABDOMINAL HYSTERECTOMY     Fibroids  . arm surgery left    . HERNIA REPAIR    . TUBAL LIGATION    . WRIST SURGERY     Social History   Social History Narrative   Patient is married [name]), has 2 children   Patient is right handed   Education level is 2 yrs of college   Caffeine consumption is 3-4 cups daily     Objective: Vital Signs: BP 107/64 (BP Location: Left Arm, Patient Position: Sitting, Cuff Size: Large)   Pulse 87   Resp 14   Ht 5\' 4"  (1.626 m)   Wt 161 lb (73 kg)   BMI 27.64 kg/m    Physical Exam  Constitutional: She is oriented to person, place, and time. She appears well-developed and well-nourished.  HENT:  Head: Normocephalic and atraumatic.  Eyes: Conjunctivae and EOM are normal.  Neck: Normal range of motion.  Cardiovascular: Normal rate, regular rhythm, normal heart sounds and intact distal pulses.   Pulmonary/Chest: Effort normal and breath sounds normal.  Abdominal: Soft. Bowel sounds are normal.  Lymphadenopathy:    She has no cervical adenopathy.  Neurological: She is alert and oriented to person, place, and time. She exhibits abnormal muscle tone.  Skin: Skin is warm and dry. Capillary refill takes less than 2 seconds.  Psychiatric: She has a normal mood and affect. Her behavior is normal.  Nursing note and vitals reviewed.    Musculoskeletal Exam: C-spine limited range of motion, thoracic spine good range of motion, lumbar scoliosis with limited range of motion. She has good range of motion of her shoulders elbows joints wrist joints. She has bilateral CMC joint thickening mild DIP joint thickening no synovitis was noted. Bilateral hip joints are good range of motion, bilateral knee joints are good range of motion ankle joints without any warmth swelling or effusion she has tenderness on palpation of her left trochanteric bursa.  CDAI Exam: No CDAI exam completed.    Investigation: Findings:    12/16 DXA  by PCP The BMD measured at Femur Neck Left is 0.526 g/cm2 with a T-score of -3.7. This patient is considered osteoporotic according to [facility]) criteria. There has been a statistically significant decrease in BMD of the Left hip since prior exam dated 12/10/2012.  Site Region Measured Date Measured Age YA BMD Significant CHANGE T-score DualFemur Neck Left 04/05/2015    62.6         -3.7    0.526 g/cm2 *  AP Spine  L1-L4     04/05/2015    62.6         -2.9    0.834 g/cm2  MRI [facility] by Dr. Pennumali:08/31/14 IMPRESSION:  This is an abnormal MRI of the cervical spine with and without contrast showing the following: 1.  Severe degenerative changes at C4-C5, C5-C6 and C6-C7 with there is disc protrusion, uncovertebral spurring and facet hypertrophy. There is mild spinal stenosis at each of these 3 levels. There is also moderately severe foraminal narrowing which could lead to impingement of the exiting C5, C6 and  C7 nerve roots. 2.  Degenerative changes are milder at C2-C3, C3-C4 and C7-T1 and are less likely to lead to nerve root impingement.  3.   The spinal cord appears normal.  4.  There are no enhancing abnormalities.  5.   Compared to the MRI of the cervical spine dated 07/07/2013, there is no definite interval change.     Imaging: Xr Lumbar Spine 2-3 Views  Result Date: 03/12/2016 Mild lumbar scoliosis. Multilevel spondylosis with anterior spurring. L3-4 narrowing and sclerosis. Mild facet joint arthropathy. Calcified aorta noted. Impression: Multilevel spondylosis and facet joint arthropathy with narrowing between L3-4 space. The scoliosis was noted.   Speciality Comments: No specialty comments available.    Procedures:  Large Joint Inj Date/Time: 03/12/2016 2:42 PM Performed by: Bo Merino Authorized by: Bo Merino   Consent Given by:  Patient Site marked: the procedure site was marked   Timeout: prior to procedure the  correct patient, procedure, and site was verified   Indications:  Pain Location:  Hip Site:  L greater trochanter Prep: patient was prepped and draped in usual sterile fashion   Needle Size:  27 G Needle Length:  1.5 inches Approach:  Lateral Ultrasound Guidance: No   Fluoroscopic Guidance: No   Arthrogram: No   Medications:  1.5 mL lidocaine 1 %; 40 mg triamcinolone acetonide 40 MG/ML Aspiration Attempted: No   Aspirate amount (mL):  0 Patient tolerance:  Patient tolerated the procedure well with no immediate complications     Allergies: Nitrofurantoin; Sulfonamide derivatives; Cefdinir; and Copaxone [glatiramer acetate]   Assessment / Plan: Visit Diagnoses:  Primary osteoarthritis of both hands - +RF-CCP+ACE, patient has no synovitis on examination she has some osteoarthritic changes on physical examination.  Primary osteoarthritis of both feet: Osteoarthritic changes and bilateral feet was also noted with no synovitis  Primary osteoarthritis of both knees: She has discomfort in her bilateral knee joints. Joint protection and muscle strengthening was discussed.  Degenerative cervical disc - severe, based on recent MRI which was reviewed.  Lower back pain: X-ray today revealed the scoliosis, multilevel spondylosis and facet joint arthropathy. I have referred her to physical therapy will see response to that. If she has inadequate response we can evaluate that further with MRI.  Left trochanteric bursitis: After different treatment options were discussed left trochanteric bursa was injected with cortisone. His stretching exercises were discussed and demonstrated. I will also refer to physical therapy.  MULTIPLE SCLEROSIS  Age related osteoporosis - T score -3.7, December 2016 -she has severe scoliosis I will obtain following labs to evaluate further. She is already on treatment. Plan: CBC with Differential/Platelet, COMPLETE METABOLIC PANEL WITH GFR, TSH, VITAMIN D 25 Hydroxy  (Vit-D Deficiency, Fractures), Parathyroid hormone, intact (no Ca), Protein electrophoresis, serum, Gliadin antibodies, serum, Tissue transglutaminase, IgA  Diverticulitis of intestine   Low back pain - Plan: XR Lumbar Spine 2-3 Views, Ambulatory referral to Physical Therapy     Orders: Orders Placed This Encounter  Procedures  . Large Joint Injection/Arthrocentesis  . XR Lumbar Spine 2-3 Views  . CBC with Differential/Platelet  . COMPLETE METABOLIC PANEL WITH GFR  . TSH  . VITAMIN D 25 Hydroxy (Vit-D Deficiency, Fractures)  . Parathyroid hormone, intact (no Ca)  . Protein electrophoresis, serum  . Gliadin antibodies, serum  . Tissue transglutaminase, IgA  . Ambulatory referral to Physical Therapy     Face-to-face time spent with patient was 40 minutes. 50% of time was spent in counseling and coordination of care.  Follow-Up Instructions: Return in about 3 months (around 06/12/2016) for Osteoarthritis.   Bo Merino, MD

## 2016-03-12 NOTE — Telephone Encounter (Signed)
Thank you I have released them to my chart and sent my chart message Remind me later to show you this.

## 2016-03-12 NOTE — Telephone Encounter (Signed)
Thank you for showing me that.

## 2016-03-12 NOTE — Telephone Encounter (Signed)
Patient's X-Rays were reviewed by Dr. Estanislado Pandy following her appointment today.  Results noted as:   "Mild lumbar scoliosis. Multilevel spondylosis with anterior spurring. L3-4  narrowing and sclerosis. Mild facet joint arthropathy. Calcified aorta  noted.  Impression: Multilevel spondylosis and facet joint arthropathy with  narrowing between L3-4 space. The scoliosis was noted."  Per Dr. Estanislado Pandy, will do trial of P.T. (order was placed today).  MRI can be ordered if symptoms persist.    Called patient to inform her of the results.  Left voicemail for patient.

## 2016-03-13 LAB — VITAMIN D 25 HYDROXY (VIT D DEFICIENCY, FRACTURES): VIT D 25 HYDROXY: 28 ng/mL — AB (ref 30–100)

## 2016-03-13 LAB — PARATHYROID HORMONE, INTACT (NO CA): PTH: 47 pg/mL (ref 14–64)

## 2016-03-14 ENCOUNTER — Encounter: Payer: Self-pay | Admitting: *Deleted

## 2016-03-14 ENCOUNTER — Encounter: Payer: Self-pay | Admitting: Diagnostic Neuroimaging

## 2016-03-14 ENCOUNTER — Telehealth: Payer: Self-pay | Admitting: *Deleted

## 2016-03-14 LAB — PROTEIN ELECTROPHORESIS, SERUM
ALPHA-1-GLOBULIN: 0.2 g/dL (ref 0.2–0.3)
ALPHA-2-GLOBULIN: 0.7 g/dL (ref 0.5–0.9)
Albumin ELP: 4.2 g/dL (ref 3.8–4.8)
BETA 2: 0.3 g/dL (ref 0.2–0.5)
BETA GLOBULIN: 0.5 g/dL (ref 0.4–0.6)
GAMMA GLOBULIN: 0.9 g/dL (ref 0.8–1.7)
Total Protein, Serum Electrophoresis: 6.9 g/dL (ref 6.1–8.1)

## 2016-03-14 LAB — GLIADIN ANTIBODIES, SERUM
GLIADIN IGA: 12 U (ref <20)
Gliadin IgG: 4 Units (ref <20)

## 2016-03-14 LAB — TISSUE TRANSGLUTAMINASE, IGA: Tissue Transglutaminase Ab, IgA: 1 U/mL (ref <4)

## 2016-03-14 NOTE — Telephone Encounter (Signed)
LVM requesting patient call back with details as to how to process the letter for work she requested. Advised she leave all information with phone staff as this RN may not be available to talk with her. Left name, number.

## 2016-03-14 NOTE — Progress Notes (Signed)
Low vitamin D ,rest normal

## 2016-03-15 NOTE — Telephone Encounter (Signed)
Letter patient requested faxed to her, at her request. Received confirmation that fax was received.

## 2016-03-19 ENCOUNTER — Other Ambulatory Visit: Payer: Self-pay | Admitting: Family Medicine

## 2016-03-25 ENCOUNTER — Ambulatory Visit: Payer: 59 | Attending: Family Medicine | Admitting: Physical Therapy

## 2016-03-25 DIAGNOSIS — R202 Paresthesia of skin: Secondary | ICD-10-CM | POA: Diagnosis not present

## 2016-03-25 DIAGNOSIS — M6281 Muscle weakness (generalized): Secondary | ICD-10-CM | POA: Diagnosis not present

## 2016-03-25 DIAGNOSIS — G8929 Other chronic pain: Secondary | ICD-10-CM | POA: Insufficient documentation

## 2016-03-25 DIAGNOSIS — M542 Cervicalgia: Secondary | ICD-10-CM | POA: Diagnosis not present

## 2016-03-25 DIAGNOSIS — M5442 Lumbago with sciatica, left side: Secondary | ICD-10-CM | POA: Diagnosis not present

## 2016-03-25 NOTE — Therapy (Signed)
Katie Maldonado, [place], [address] Phone: [phone]   Fax:  [phone]  Physical Therapy Evaluation  Patient Details  Name: Katie Maldonado MRN: [id number] Date of Birth: [birth year] Referring Provider: Dr. Estanislado Pandy  Encounter Date: 03/25/2016      PT End of Session - 03/25/16 1514    Visit Number 1   Number of Visits 16   Date for PT Re-Evaluation 05/20/16   PT Start Time [id number]   PT Stop Time 1502   PT Time Calculation (min) 42 min   Activity Tolerance Patient tolerated treatment well   Behavior During Therapy [facility] for tasks assessed/performed      Past Medical History:  Diagnosis Date  . Allergy   . Anxiety   . Arthritis   . Diverticulitis   . Diverticulosis 10/2015  . Hypersomnolence   . Migraine   . Multiple sclerosis ([place])   . Neuromuscular disorder ([place])   . Obesity, unspecified   . Osteoporosis   . Other and unspecified hyperlipidemia   . TIA (transient ischemic attack) 20    Past Surgical History:  Procedure Laterality Date  . ABDOMINAL HYSTERECTOMY     Fibroids  . arm surgery left    . HERNIA REPAIR    . TUBAL LIGATION    . WRIST SURGERY      There were no vitals filed for this visit.       Subjective Assessment - 03/25/16 1422    Subjective Patient has had 6 mos of lumbar pain which radiates into LLE.  She has difficulty sleeping, sitting prolonged periods (work), housework.  L leg feels weak. Standing and walking can also increase pain as well.     Pertinent History MS, osteoporosis   Limitations Sitting;Lifting;Standing;Walking;House hold activities   How long can you sit comfortably? 30 min ? Pt unsure but needs to take frequent standing breaks at work    How long can you stand comfortably? unsure, better than sitting    How long can you walk comfortably? unsure, does not walk regularly for fitness    Diagnostic tests XR showed facet arthropathy, spondylsis and  L3-L4 narrowing disc space    Patient Stated Goals To make it stop hurtin'   Currently in Pain? Yes   Pain Score 6    Pain Location Back   Pain Orientation Lower;Left   Pain Descriptors / Indicators Burning;Tightness;Aching   Pain Type Chronic pain   Pain Radiating Towards LLE to knee to thigh    Pain Onset More than a month ago   Pain Frequency Constant  does not have back pain without leg pain    Aggravating Factors  sitting, standing, bending    Pain Relieving Factors changing positions, meds with min relief, heating pad.  "I don't like massages"   Effect of Pain on Daily Activities pain is constant, limits activity    Multiple Pain Sites No            [facility] PT Assessment - 03/25/16 1435      Assessment   Medical Diagnosis low back pain with sciatica   Referring Provider Dr. Estanislado Pandy   Onset Date/Surgical Date --  6 mos    Prior Therapy Yes, for cervical spine 08/2015     Precautions   Precautions None     Restrictions   Weight Bearing Restrictions No     Balance Screen   Has the patient fallen in the past 6 months No  Home Environment   Living Environment Private residence   Living Arrangements Spouse/significant other   Type of [place] Access Level entry   [place] One level     Prior Function   Level of Independence Independent   Vocation Full time employment   Vocation Requirements computer work   Leisure family time, reading, riding bike     Cognition   Overall Cognitive Status Within Functional Limits for tasks assessed   Attention --  subjective report of problems focusing at work      Sensation   Additional Comments numbness L post thigh and centrally in post cervical spine      Posture/Postural Control   Postural Limitations Rounded Shoulders;Forward head;Right pelvic obliquity   Posture Comments high Rt. shoulder      Strength   Overall Strength --  LE WFL except hip ext/abd   Right Hip Extension 3+/5   Right Hip  ABduction 5/5   Left Hip Extension 3/5   Left Hip ABduction 4+/5     Flexibility   Hamstrings 65   Piriformis tight L>R in figure 4, L hip arthritis lacks full IR      Palpation   Spinal mobility good, difficulty for patient to relax    Palpation comment pt guarded and reports pain with palpation diffusely in middle and upper back as well as lower back.  No pain in glutes, pain in paraspinals of lumbosacral area     Straight Leg Raise   Findings Negative   Comment bilat                    [facility] Adult PT Treatment/Exercise - 03/25/16 1435      Lumbar Exercises: Stretches   Active Hamstring Stretch 2 reps;30 seconds   Prone on Elbows Stretch 1 rep;30 seconds   Prone on Elbows Stretch Limitations no change in LE sx     Lumbar Exercises: Quadruped   Opposite Arm/Leg Raise Right arm/Left leg;Left arm/Right leg;5 reps   Opposite Arm/Leg Raise Limitations weakness, needs cues and correction    Other Quadruped Lumbar Exercises childs pose x 5 x 15 sec      Knee/Hip Exercises: Stretches   Piriformis Stretch Both;2 reps;30 seconds   Piriformis Stretch Limitations figure 4                 PT Education - 03/25/16 1514    Education provided Yes   Education Details PT/POC, HEP, arthritis and causes of leg pain    Person(s) Educated Patient   Methods Explanation;Demonstration;Handout   Comprehension Verbalized understanding;Returned demonstration          PT Short Term Goals - 03/25/16 1520      PT SHORT TERM GOAL #1   Title Pt will be I with initial HEP    Time 3   Period Weeks   Status New     PT SHORT TERM GOAL #2   Title Pt will take regular standing breaks each hour to relieve back, leg pain    Baseline inconsistent   Time 3   Period Weeks   Status New     PT SHORT TERM GOAL #3   Title Pt will report min less pain/sensory disturbance in leg (becoming more intermittent)    Time 3   Period Weeks           PT Long Term Goals - 03/25/16 1524       PT LONG TERM GOAL #1  Title Pt will report at least 25% improvement in LLE numbness and tingling.     Time 8   Period Weeks   Status New     PT LONG TERM GOAL #2   Title Pt will be able to score FOTO 35% or less disabled to demo improvement in functional mobility.    Time 8   Period Weeks   Status New     PT LONG TERM GOAL #3   Title Independent in HEP for core and LLE strengthening.   Time 8   Period Weeks   Status New     PT LONG TERM GOAL #4   Title Pt will be able to sit for 1 hour with min occasional pain in back, leg for work, meals    Time 8   Period Weeks   Status New     PT LONG TERM GOAL #5   Title Pt will be able to return to more restlful sleep, not waking due to pain with repositioning.    Time 8   Period Weeks   Status New               Plan - 03/25/16 1515    Clinical Impression Statement Patient with low complexity eval for lumbar pain with radiculopathy in LLE.  Was not able to change leg symptoms with positions or special tests. She has min weakness in hips and core, mild deficits in hip ROM.  PT in the past has helped her neck pain but MS sensory symptoms may play a part as well.     Rehab Potential Good   PT Frequency 2x / week   PT Duration 8 weeks   PT Treatment/Interventions ADLs/Self Care Home Management;Moist Heat;Therapeutic activities;Dry needling;Taping;Therapeutic exercise;Ultrasound;Neuromuscular re-education;Cryotherapy;Electrical Stimulation;Functional mobility training;Passive range of motion;Patient/family education;Iontophoresis 4mg /ml Dexamethasone   PT Next Visit Plan check HEP, NuStep, hip mobs, manual if tolerated   PT Home Exercise Plan childs pose, bird dog, piriformis and hamstring stretch    Consulted and Agree with Plan of Care Patient      Patient will benefit from skilled therapeutic intervention in order to improve the following deficits and impairments:  Impaired sensation, Postural dysfunction, Decreased  strength, Decreased mobility, Increased fascial restricitons, Decreased range of motion, Pain  Visit Diagnosis: Chronic midline low back pain with left-sided sciatica  Muscle weakness (generalized)  Paresthesia of skin     Problem List Patient Active Problem List   Diagnosis Date Noted  . Trochanteric bursitis of left hip 03/12/2016  . Primary osteoarthritis of both feet 03/09/2016  . Primary osteoarthritis of both knees 03/09/2016  . Degenerative cervical disc 10/04/2014  . Primary osteoarthritis of both hands 08/19/2014  . Low back pain 11/26/2013  . Numbness and tingling in left arm 07/09/2013  . Anxiety state, unspecified 12/30/2012  . Other malaise and fatigue 12/30/2012  . Osteoporosis 11/13/2012  . Incontinence of urine 04/18/2011  . HYPERLIPIDEMIA-MIXED 01/08/2010  . MULTIPLE SCLEROSIS 01/08/2010    Ebony Yorio 03/25/2016, 3:32 PM  [facility] [address] [place], [place], [address] Phone: [phone]   Fax:  [phone]  Name: MAYGEN SUDDITH MRN: [id number] Date of Birth: [birth year]  Raeford Razor, PT 03/25/16 3:32 PM Phone: [phone] Fax: [phone]

## 2016-03-26 ENCOUNTER — Telehealth: Payer: Self-pay

## 2016-03-26 NOTE — Telephone Encounter (Signed)
Pt is checking on status of her refill request for prilosec and tecfidera   Best number [phone] ok to leave a message

## 2016-03-27 ENCOUNTER — Encounter: Payer: Self-pay | Admitting: Physician Assistant

## 2016-03-27 ENCOUNTER — Ambulatory Visit: Payer: 59 | Admitting: Physical Therapy

## 2016-03-27 DIAGNOSIS — M6281 Muscle weakness (generalized): Secondary | ICD-10-CM | POA: Diagnosis not present

## 2016-03-27 DIAGNOSIS — M542 Cervicalgia: Secondary | ICD-10-CM

## 2016-03-27 DIAGNOSIS — M5442 Lumbago with sciatica, left side: Secondary | ICD-10-CM | POA: Diagnosis not present

## 2016-03-27 DIAGNOSIS — R202 Paresthesia of skin: Secondary | ICD-10-CM

## 2016-03-27 DIAGNOSIS — G8929 Other chronic pain: Secondary | ICD-10-CM | POA: Diagnosis not present

## 2016-03-27 NOTE — Patient Instructions (Signed)

## 2016-03-27 NOTE — Therapy (Signed)
[place], [place], [address] Phone: [phone]   Fax:  [phone]  Physical Therapy Treatment  Patient Details  Name: Katie Maldonado MRN: [id number] Date of Birth: [birth year] Referring Provider: Dr. Estanislado Pandy  Encounter Date: 03/27/2016      PT End of Session - 03/27/16 1518    Visit Number 2   Number of Visits 16   Date for PT Re-Evaluation 05/20/16   PT Start Time [id number]   PT Stop Time 1530   PT Time Calculation (min) 56 min   Activity Tolerance Patient tolerated treatment well   Behavior During Therapy [facility] for tasks assessed/performed      Past Medical History:  Diagnosis Date  . Allergy   . Anxiety   . Arthritis   . Diverticulitis   . Diverticulosis 10/2015  . Hypersomnolence   . Migraine   . Multiple sclerosis ([place])   . Neuromuscular disorder ([place])   . Obesity, unspecified   . Osteoporosis   . Other and unspecified hyperlipidemia   . TIA (transient ischemic attack) 20    Past Surgical History:  Procedure Laterality Date  . ABDOMINAL HYSTERECTOMY     Fibroids  . arm surgery left    . HERNIA REPAIR    . TUBAL LIGATION    . WRIST SURGERY      There were no vitals filed for this visit.      Subjective Assessment - 03/27/16 1437    Subjective I feel like a "weinee". I am nauseous from this pain.  Took Zofran.    Currently in Pain? Yes   Pain Score 8    Pain Location Back   Pain Orientation Lower;Left               [facility] Adult PT Treatment/Exercise - 03/27/16 1442      Lumbar Exercises: Stretches   Single Knee to Chest Stretch 2 reps;30 seconds   Lower Trunk Rotation 10 seconds   Lower Trunk Rotation Limitations x 10    Pelvic Tilt 10 seconds   Pelvic Tilt Limitations x 10      Lumbar Exercises: Supine   Ab Set 10 reps   Clam 10 reps   Clam Limitations bilat and unilat   Heel Slides 15 reps     Lumbar Exercises: Quadruped   Other Quadruped Lumbar  Exercises childs pose x 5 x 15 sec   added in lateral      Knee/Hip Exercises: Stretches   Active Hamstring Stretch 2 reps;30 seconds   Piriformis Stretch Both;2 reps;30 seconds   Piriformis Stretch Limitations figure 4      Moist Heat Therapy   Number Minutes Moist Heat --   Moist Heat Location --     Electrical Stimulation   Electrical Stimulation Location LLE and lumbar   Electrical Stimulation Action IFC   Electrical Stimulation Parameters to tol   Electrical Stimulation Goals Pain     Manual Therapy   Manual Therapy Manual Traction   Manual Traction bilateral LE long axis                PT Education - 03/27/16 1517    Education provided Yes   Education Details stabilization HEP   Person(s) Educated Patient   Methods Explanation   Comprehension Verbalized understanding          PT Short Term Goals - 03/25/16 1520      PT SHORT TERM GOAL #1   Title Pt  will be I with initial HEP    Time 3   Period Weeks   Status New     PT SHORT TERM GOAL #2   Title Pt will take regular standing breaks each hour to relieve back, leg pain    Baseline inconsistent   Time 3   Period Weeks   Status New     PT SHORT TERM GOAL #3   Title Pt will report min less pain/sensory disturbance in leg (becoming more intermittent)    Time 3   Period Weeks           PT Long Term Goals - 03/25/16 1524      PT LONG TERM GOAL #1   Title Pt will report at least 25% improvement in LLE numbness and tingling.     Time 8   Period Weeks   Status New     PT LONG TERM GOAL #2   Title Pt will be able to score FOTO 35% or less disabled to demo improvement in functional mobility.    Time 8   Period Weeks   Status New     PT LONG TERM GOAL #3   Title Independent in HEP for core and LLE strengthening.   Time 8   Period Weeks   Status New     PT LONG TERM GOAL #4   Title Pt will be able to sit for 1 hour with min occasional pain in back, leg for work, meals    Time 8    Period Weeks   Status New     PT LONG TERM GOAL #5   Title Pt will be able to return to more restlful sleep, not waking due to pain with repositioning.    Time 8   Period Weeks   Status New               Plan - 03/27/16 1519    Clinical Impression Statement Pain increased today, patient is discouraged.  LLE painful throughout session.  initiated core stability exercises today. Pt relaxed, less pain post session.    PT Next Visit Plan check HEP, NuStep, hip mobs, manual if tolerated, IFC repeat?   PT Home Exercise Plan childs pose, bird dog, piriformis and hamstring stretch , prepilates    Consulted and Agree with Plan of Care Patient      Patient will benefit from skilled therapeutic intervention in order to improve the following deficits and impairments:  Impaired sensation, Postural dysfunction, Decreased strength, Decreased mobility, Increased fascial restricitons, Decreased range of motion, Pain  Visit Diagnosis: Chronic midline low back pain with left-sided sciatica  Muscle weakness (generalized)  Paresthesia of skin  Cervicalgia     Problem List Patient Active Problem List   Diagnosis Date Noted  . Trochanteric bursitis of left hip 03/12/2016  . Primary osteoarthritis of both feet 03/09/2016  . Primary osteoarthritis of both knees 03/09/2016  . Degenerative cervical disc 10/04/2014  . Primary osteoarthritis of both hands 08/19/2014  . Low back pain 11/26/2013  . Numbness and tingling in left arm 07/09/2013  . Anxiety state, unspecified 12/30/2012  . Other malaise and fatigue 12/30/2012  . Osteoporosis 11/13/2012  . Incontinence of urine 04/18/2011  . HYPERLIPIDEMIA-MIXED 01/08/2010  . MULTIPLE SCLEROSIS 01/08/2010    Kynedi Profitt 03/27/2016, 3:39 PM  [facility] [address] [place], [place], [address] Phone: [phone]   Fax:  [phone]  Name: Katie Maldonado MRN: [id number] Date of  Birth:  09-30-1950  Raeford Razor, PT 03/27/16 3:39 PM Phone: [phone] Fax: [phone]

## 2016-03-28 ENCOUNTER — Telehealth: Payer: Self-pay | Admitting: Emergency Medicine

## 2016-03-28 NOTE — Telephone Encounter (Signed)
Left message patient will need to schedule f/u visit with [place] for medication refills

## 2016-03-29 MED FILL — OMEPRAZOLE 20 MG CAPSULE DR: 20 | 30 days supply | Qty: 30 | Fill #0

## 2016-03-29 MED FILL — CITALOPRAM HBR 20 MG TABLET: 20 | 30 days supply | Qty: 30 | Fill #0

## 2016-04-01 ENCOUNTER — Ambulatory Visit: Payer: 59 | Attending: Family Medicine | Admitting: Physical Therapy

## 2016-04-01 DIAGNOSIS — R202 Paresthesia of skin: Secondary | ICD-10-CM | POA: Diagnosis not present

## 2016-04-01 DIAGNOSIS — M5442 Lumbago with sciatica, left side: Secondary | ICD-10-CM | POA: Diagnosis not present

## 2016-04-01 DIAGNOSIS — M6281 Muscle weakness (generalized): Secondary | ICD-10-CM | POA: Insufficient documentation

## 2016-04-01 DIAGNOSIS — G8929 Other chronic pain: Secondary | ICD-10-CM | POA: Diagnosis not present

## 2016-04-01 NOTE — Therapy (Signed)
[place], [place], [address] Phone: [phone]   Fax:  [phone]  Physical Therapy Treatment  Patient Details  Name: Katie Maldonado MRN: [id number] Date of Birth: Jun 17, 1950 Referring Provider: Dr. Estanislado Pandy  Encounter Date: 04/01/2016      PT End of Session - 04/01/16 1521    Visit Number 3   Number of Visits 16   Date for PT Re-Evaluation 05/20/16   PT Start Time [id number]   PT Stop Time 1432   PT Time Calculation (min) 43 min   Activity Tolerance Patient tolerated treatment well   Behavior During Therapy [facility] for tasks assessed/performed      Past Medical History:  Diagnosis Date  . Allergy   . Anxiety   . Arthritis   . Diverticulitis   . Diverticulosis 10/2015  . Hypersomnolence   . Migraine   . Multiple sclerosis ([place])   . Neuromuscular disorder ([place])   . Obesity, unspecified   . Osteoporosis   . Other and unspecified hyperlipidemia   . TIA (transient ischemic attack) 20    Past Surgical History:  Procedure Laterality Date  . ABDOMINAL HYSTERECTOMY     Fibroids  . arm surgery left    . HERNIA REPAIR    . TUBAL LIGATION    . WRIST SURGERY      There were no vitals filed for this visit.      Subjective Assessment - 04/01/16 1349    Subjective Its a 6/10 today.  The stim helped my pain the rest of the day.    Currently in Pain? Yes   Pain Score 6    Pain Location Back   Pain Orientation Left;Lower   Pain Type Chronic pain   Pain Onset More than a month ago   Pain Frequency Constant                         [facility] Adult PT Treatment/Exercise - 04/01/16 1352      Lumbar Exercises: Stretches   Active Hamstring Stretch 2 reps;30 seconds   Single Knee to Chest Stretch 2 reps;30 seconds   Lower Trunk Rotation 2 reps;30 seconds   Lower Trunk Rotation Limitations knee to chest and rotate    Piriformis Stretch 2 reps;30 seconds     Lumbar Exercises:  Supine   Clam 20 reps   Clam Limitations bilat supine blue band      Lumbar Exercises: Sidelying   Clam 20 reps   Hip Abduction 20 reps     Moist Heat Therapy   Number Minutes Moist Heat 15 Minutes   Moist Heat Location Lumbar Spine     Electrical Stimulation   Electrical Stimulation Location LLE and lumbar   Electrical Stimulation Action IFC   Electrical Stimulation Parameters to tol    Electrical Stimulation Goals Pain     Manual Therapy   Manual Therapy Soft tissue mobilization   Soft tissue mobilization gentle to lumbar L in prone prior to IFC, well tolerated                  PT Short Term Goals - 04/01/16 1527      PT SHORT TERM GOAL #1   Title Pt will be I with initial HEP    Status On-going     PT SHORT TERM GOAL #2   Title Pt will take regular standing breaks each hour to relieve back, leg pain  Baseline inconsistent   Status On-going     PT SHORT TERM GOAL #3   Title Pt will report min less pain/sensory disturbance in leg (becoming more intermittent)    Status On-going           PT Long Term Goals - 03/25/16 1524      PT LONG TERM GOAL #1   Title Pt will report at least 25% improvement in LLE numbness and tingling.     Time 8   Period Weeks   Status New     PT LONG TERM GOAL #2   Title Pt will be able to score FOTO 35% or less disabled to demo improvement in functional mobility.    Time 8   Period Weeks   Status New     PT LONG TERM GOAL #3   Title Independent in HEP for core and LLE strengthening.   Time 8   Period Weeks   Status New     PT LONG TERM GOAL #4   Title Pt will be able to sit for 1 hour with min occasional pain in back, leg for work, meals    Time 8   Period Weeks   Status New     PT LONG TERM GOAL #5   Title Pt will be able to return to more restlful sleep, not waking due to pain with repositioning.    Time 8   Period Weeks   Status New               Plan - 04/01/16 1525    Clinical Impression  Statement Patient late, was confused about the time of her appt.  She did have less pain after modalities last session.  Did not check goals due to abbreviated session.    PT Next Visit Plan check HEP, NuStep, hip mobs, manual if tolerated, IFC repeat?   PT Home Exercise Plan childs pose, bird dog, piriformis and hamstring stretch , prepilates    Consulted and Agree with Plan of Care Patient      Patient will benefit from skilled therapeutic intervention in order to improve the following deficits and impairments:  Impaired sensation, Postural dysfunction, Decreased strength, Decreased mobility, Increased fascial restricitons, Decreased range of motion, Pain  Visit Diagnosis: Chronic midline low back pain with left-sided sciatica  Muscle weakness (generalized)  Paresthesia of skin     Problem List Patient Active Problem List   Diagnosis Date Noted  . Trochanteric bursitis of left hip 03/12/2016  . Primary osteoarthritis of both feet 03/09/2016  . Primary osteoarthritis of both knees 03/09/2016  . Degenerative cervical disc 10/04/2014  . Primary osteoarthritis of both hands 08/19/2014  . Low back pain 11/26/2013  . Numbness and tingling in left arm 07/09/2013  . Anxiety state, unspecified 12/30/2012  . Other malaise and fatigue 12/30/2012  . Osteoporosis 11/13/2012  . Incontinence of urine 04/18/2011  . HYPERLIPIDEMIA-MIXED 01/08/2010  . MULTIPLE SCLEROSIS 01/08/2010    PAA,JENNIFER 04/01/2016, 3:32 PM  [facility] [address] [place], [place], [address] Phone: [phone]   Fax:  [phone]  Name: Katie Maldonado MRN: [id number] Date of Birth: May 14, 1950  Raeford Razor, PT 04/01/16 3:33 PM Phone: [phone] Fax: [phone]

## 2016-04-02 ENCOUNTER — Encounter: Payer: Self-pay | Admitting: Diagnostic Neuroimaging

## 2016-04-02 ENCOUNTER — Telehealth: Payer: Self-pay | Admitting: Diagnostic Neuroimaging

## 2016-04-02 MED ORDER — AMPHETAMINE-DEXTROAMPHET ER 20 MG PO CP24: 20.0000 mg | ORAL_CAPSULE | Freq: Every day | ORAL | 0 refills | Status: DC

## 2016-04-02 NOTE — Telephone Encounter (Signed)
Ok to try adderall xr 20mg  daily. -VRP

## 2016-04-03 ENCOUNTER — Ambulatory Visit: Payer: 59 | Admitting: Physical Therapy

## 2016-04-03 DIAGNOSIS — M6281 Muscle weakness (generalized): Secondary | ICD-10-CM

## 2016-04-03 DIAGNOSIS — R202 Paresthesia of skin: Secondary | ICD-10-CM

## 2016-04-03 DIAGNOSIS — M5442 Lumbago with sciatica, left side: Principal | ICD-10-CM

## 2016-04-03 DIAGNOSIS — G8929 Other chronic pain: Secondary | ICD-10-CM | POA: Diagnosis not present

## 2016-04-03 NOTE — Telephone Encounter (Signed)
[facility] outpt pharm called says RX was faxed to them. Pt must bring it  Thanks

## 2016-04-03 NOTE — Telephone Encounter (Signed)
Noted. Paper prescription ready for pt to pick up at front desk. Patient notified by reply to her earlier My Chart message.

## 2016-04-03 NOTE — Therapy (Addendum)
[place], [place], [address] Phone: [phone]   Fax:  [phone]  Physical Therapy Treatment  Patient Details  Name: Katie Maldonado MRN: [id number] Date of Birth: November 08, 1950 Referring Provider: Dr. Estanislado Pandy  Encounter Date: 04/03/2016      PT End of Session - 04/03/16 1516    Visit Number 4   Number of Visits 16   Date for PT Re-Evaluation 05/20/16   PT Start Time [id number]   PT Stop Time 1528   PT Time Calculation (min) 57 min   Activity Tolerance Patient tolerated treatment well   Behavior During Therapy [facility] for tasks assessed/performed      Past Medical History:  Diagnosis Date  . Allergy   . Anxiety   . Arthritis   . Diverticulitis   . Diverticulosis 10/2015  . Hypersomnolence   . Migraine   . Multiple sclerosis ([place])   . Neuromuscular disorder ([place])   . Obesity, unspecified   . Osteoporosis   . Other and unspecified hyperlipidemia   . TIA (transient ischemic attack) 20    Past Surgical History:  Procedure Laterality Date  . ABDOMINAL HYSTERECTOMY     Fibroids  . arm surgery left    . HERNIA REPAIR    . TUBAL LIGATION    . WRIST SURGERY      There were no vitals filed for this visit.      Subjective Assessment - 04/03/16 1432    Subjective Still the same, 6/10.  Pain in low back and post thigh.            [facility] Adult PT Treatment/Exercise - 04/03/16 1440      Lumbar Exercises: Stretches   Active Hamstring Stretch 2 reps;30 seconds   Quadruped Mid Back Stretch 3 reps;20 seconds   Quadruped Mid Back Stretch Limitations sink stretch for "traction-effect   Piriformis Stretch 2 reps;30 seconds     Lumbar Exercises: Aerobic   Stationary Bike NuStep L5 UE and LE for 6 min      Knee/Hip Exercises: Stretches   Hip Flexor Stretch Left;2 reps;30 seconds     Moist Heat Therapy   Number Minutes Moist Heat 15 Minutes   Moist Heat Location Lumbar Spine     Electrical Stimulation   Electrical Stimulation Location LLE and lumbar   Electrical Stimulation Action IFC   Electrical Stimulation Parameters to tol    Electrical Stimulation Goals Pain     Ultrasound   Ultrasound Location L QL and lumbar paraspinal   Ultrasound Parameters 100% duty, 1.5 W/cm2, 1 MHz   Ultrasound Goals Pain     Manual Therapy   Soft tissue mobilization L QL, traction like pressure to iliac crest to elongate    Manual Traction LLE long axis traction with slight hip abd and ER, 5 trials, 15 sec each                 PT Education - 04/03/16 1516    Education provided Yes   Education Details stretching, use of body for self- traction    Person(s) Educated Patient   Methods Explanation;Demonstration   Comprehension Verbalized understanding;Returned demonstration          PT Short Term Goals - 04/01/16 1527      PT SHORT TERM GOAL #1   Title Pt will be I with initial HEP    Status On-going     PT SHORT TERM GOAL #2   Title Pt will  take regular standing breaks each hour to relieve back, leg pain    Baseline inconsistent   Status On-going     PT SHORT TERM GOAL #3   Title Pt will report min less pain/sensory disturbance in leg (becoming more intermittent)    Status On-going           PT Long Term Goals - 04/03/16 1517      PT LONG TERM GOAL #1   Title Pt will report at least 25% improvement in LLE numbness and tingling.     Status On-going     PT LONG TERM GOAL #2   Title Pt will be able to score FOTO 35% or less disabled to demo improvement in functional mobility.    Status On-going     PT LONG TERM GOAL #3   Title Independent in HEP for core and LLE strengthening.   Status On-going     PT LONG TERM GOAL #4   Title Pt will be able to sit for 1 hour with min occasional pain in back, leg for work, meals    Status On-going     PT LONG TERM GOAL #5   Title Pt will be able to return to more restlful sleep, not waking due to pain with  repositioning.    Status On-going               Plan - 04/03/16 1521    Clinical Impression Statement Pt cont with pain in LLE, has not really experienced pain relief that carries over.  She does respond well to stretching, traction for her is contraindicated (osteoporosis) .  Manual traction relieves pain during treatment.    PT Next Visit Plan DN to lumbar/QL L side.  Try some prone/ext based stab.  manual. repeat US/ITB if favorable   PT Home Exercise Plan childs pose, bird dog, piriformis and hamstring stretch , prepilates    Consulted and Agree with Plan of Care Patient      Patient will benefit from skilled therapeutic intervention in order to improve the following deficits and impairments:  Impaired sensation, Postural dysfunction, Decreased strength, Decreased mobility, Increased fascial restricitons, Decreased range of motion, Pain  Visit Diagnosis: Chronic midline low back pain with left-sided sciatica  Muscle weakness (generalized)  Paresthesia of skin     Problem List Patient Active Problem List   Diagnosis Date Noted  . Trochanteric bursitis of left hip 03/12/2016  . Primary osteoarthritis of both feet 03/09/2016  . Primary osteoarthritis of both knees 03/09/2016  . Degenerative cervical disc 10/04/2014  . Primary osteoarthritis of both hands 08/19/2014  . Low back pain 11/26/2013  . Numbness and tingling in left arm 07/09/2013  . Anxiety state, unspecified 12/30/2012  . Other malaise and fatigue 12/30/2012  . Osteoporosis 11/13/2012  . Incontinence of urine 04/18/2011  . HYPERLIPIDEMIA-MIXED 01/08/2010  . MULTIPLE SCLEROSIS 01/08/2010    Mishon Blubaugh 04/03/2016, 3:25 PM  [facility] [address] [place], [place], [address] Phone: [phone]   Fax:  [phone]  Name: CYLEE MONCE MRN: [id number] Date of Birth: November 14, 1950  Raeford Razor, PT 04/03/16 3:26 PM Phone: [phone] Fax:  [phone]

## 2016-04-03 NOTE — Patient Instructions (Addendum)
Traction like stretching   Hip Flexor Stretch    Lying on back near edge of bed, bend one leg, foot flat. Hang other leg over edge, relaxed, thigh resting entirely on bed for __30-60 sec __ minutes. Repeat __3__ times. Do __2__ sessions per day.   http://gt2.exer.us/347   Copyright  VHI. All rights reserved.   Traction: Doorjamb Hang    Holding top of doorjamb, relax back and hang, supporting weight through arms. Repeat _3___ times per set. Do _1___ sets per session. Do __5__ sessions per week.  Copyright  VHI. All rights reserved.   Lie on your back and relax:) Spouse lightly holds LLE turns toes out and brings leg out just a but Spouse leans back with knees bent and holds gentle stretch on LLE Hold for 15 sec and repeat 3 times  Release leg slowly

## 2016-04-04 MED FILL — DEXTROAMP-AMPHET ER 20 MG C: 20 | 30 days supply | Qty: 30 | Fill #0

## 2016-04-08 ENCOUNTER — Encounter: Payer: Self-pay | Admitting: Neurology

## 2016-04-08 ENCOUNTER — Ambulatory Visit: Payer: 59 | Admitting: Physical Therapy

## 2016-04-08 ENCOUNTER — Ambulatory Visit (INDEPENDENT_AMBULATORY_CARE_PROVIDER_SITE_OTHER): Payer: 59 | Admitting: Neurology

## 2016-04-08 VITALS — BP 112/71 | HR 83 | Resp 20 | Ht 64.0 in | Wt 157.0 lb

## 2016-04-08 DIAGNOSIS — G4710 Hypersomnia, unspecified: Secondary | ICD-10-CM | POA: Diagnosis not present

## 2016-04-08 DIAGNOSIS — R0683 Snoring: Secondary | ICD-10-CM

## 2016-04-08 DIAGNOSIS — G35 Multiple sclerosis: Secondary | ICD-10-CM | POA: Diagnosis not present

## 2016-04-08 DIAGNOSIS — M5442 Lumbago with sciatica, left side: Secondary | ICD-10-CM | POA: Diagnosis not present

## 2016-04-08 DIAGNOSIS — G8929 Other chronic pain: Secondary | ICD-10-CM | POA: Diagnosis not present

## 2016-04-08 DIAGNOSIS — M6281 Muscle weakness (generalized): Secondary | ICD-10-CM | POA: Diagnosis not present

## 2016-04-08 DIAGNOSIS — R202 Paresthesia of skin: Secondary | ICD-10-CM

## 2016-04-08 DIAGNOSIS — R400 Somnolence: Secondary | ICD-10-CM | POA: Diagnosis not present

## 2016-04-08 NOTE — Patient Instructions (Addendum)
We will look into your severe sleepiness with a nighttime sleep study, followed by a daytime nap study. In preparation for sleep study testing, we will have to have you taper off of the citalopram: please discuss with Tenna Delaine, your PA, whether it is okay for you to come off the citalopram, you have to be off of it and your stimulant, Adderall XR for 2 weeks prior to sleep study testing as well.

## 2016-04-08 NOTE — Progress Notes (Signed)
Subjective:    Patient ID: Katie Maldonado is a 65 y.o. female.  HPI     Star Age, MD, PhD [facility] [address], [address] [place], [place] [address]  Dear Bonnita Levan,   I saw your patient, Katie Maldonado, upon your kind request, in my clinic today for initial consultation of her sleep disorder, in particular, Her significant daytime somnolence. The patient is unaccompanied today. As you know, Katie Maldonado is a 65 year old right-handed woman with an underlying medical history of multiple sclerosis, osteoporosis, reflux disease, depression, anxiety and overweight state, who reports snoring and excessive daytime somnolence. I reviewed your office note from 03/06/2016.  Sleepiness has become worse in the past 2 months and she has woken up from her own snoring.she has come close to falling asleep while driving and also falling asleep while at work. She works in outpatient medical records. She has not been witnessed to have apneic pauses while asleep and does not typically wake up gasping for air. She denies morning headaches or night to night nocturia or family history of obstructive sleep apnea. She has been on betaseron for years and switched to [place] in Nov 2016. She denies morning headaches, has nocturia 1-2 per night, takes Myrbetriq for the past year. You started her on Adderall XR 10 mg about 1 month ago, which helped her EDS and she increased it to 20 mg today. She tried Ritalin some 20 years ago.  She had a sleep study at [facility] some 10 years ago, which per her report was neg for OSA.  She naps, when possible and can take longer naps, up to 2 hours. She denies cataplexy, sleep paralysis or hypnagogic or hypnopompic hallucinations, but has had dreams in her naps. The time is between 9:30 and 10 PM. She falls asleep fairly quickly. She denies any restless leg symptoms are twitching of her legs at night as far she can tell. She has had painful leg  cramps at times. She has had some low back pain. She lives at home with her husband. She has 2 grown children. She quit smoking in 2005 and drinks alcohol infrequently, maybe 1 or 2 glasses per week, thinks coffee in the morning, typically 2 cups, occasional sodas, she tries to drink enough water.  Her Past Medical History Is Significant For: Past Medical History:  Diagnosis Date  . Allergy   . Anxiety   . Arthritis   . Diverticulitis   . Diverticulosis 10/2015  . Hypersomnolence   . Migraine   . Multiple sclerosis ([place])   . Neuromuscular disorder ([place])   . Obesity, unspecified   . Osteoporosis   . Other and unspecified hyperlipidemia   . TIA (transient ischemic attack) 20    Her Past Surgical History Is Significant For: Past Surgical History:  Procedure Laterality Date  . ABDOMINAL HYSTERECTOMY     Fibroids  . arm surgery left    . HERNIA REPAIR    . TUBAL LIGATION    . WRIST SURGERY      Her Family History Is Significant For: Family History  Problem Relation Age of Onset  . Emphysema Father   . Cancer Brother     bone  . Heart disease Brother   . Cancer Maternal Grandmother   . Heart attack Brother   . Cancer Maternal Grandfather   . Cancer Paternal Grandmother   . Cancer Paternal Grandfather     Her Social History Is Significant For: Social History  Social History  . Marital status: Married    Spouse name: Dyke Brackett"  . Number of children: 2  . Years of education: college   Occupational History  . PATIENT ACCOUNTING [place]  . [employer] [place]   Social History Main Topics  . Smoking status: Former Smoker    Years: 37.00    Types: Cigarettes    Quit date: 02/26/2004  . Smokeless tobacco: Never Used  . Alcohol use 0.0 oz/week     Comment: occ  . Drug use: No  . Sexual activity: Yes    Birth control/ protection: None   Other Topics Concern  . None   Social History Narrative   Patient is married [name]), has 2  children   Patient is right handed   Education level is 2 yrs of college   Caffeine consumption is 3-4 cups daily    Her Allergies Are:  Allergies  Allergen Reactions  . Nitrofurantoin Shortness Of Breath    Shortness of breath, violent chills, dizziness, nausea  . Sulfonamide Derivatives Shortness Of Breath    Shortness of breath, chills  . Cefdinir     Can't remember what rx had 2 yrs ago  . Copaxone [Glatiramer Acetate]   :   Her Current Medications Are:  Outpatient Encounter Prescriptions as of 04/08/2016  Medication Sig  . alendronate (FOSAMAX) 70 MG tablet Take 1 tablet (70 mg total) by mouth once a week. Take with a full glass of water on an empty stomach.  Marland Kitchen amphetamine-dextroamphetamine (ADDERALL XR) 20 MG 24 hr capsule Take 1 capsule (20 mg total) by mouth daily.  Marland Kitchen aspirin EC 81 MG tablet Take 1 tablet (81 mg total) by mouth daily.  . cholecalciferol (VITAMIN D) 1000 UNITS tablet Take 1,000 Units by mouth daily. Reported on 08/08/2015  . citalopram (CELEXA) 20 MG tablet TAKE 1 TABLET BY MOUTH ONCE DAILY  . Dimethyl Fumarate (TECFIDERA) 240 MG CPDR Take 240 mg by mouth 2 (two) times daily.  Marland Kitchen ibuprofen (ADVIL,MOTRIN) 200 MG tablet Take 2 tablets (400 mg total) by mouth 4 (four) times daily.  . mirabegron ER (MYRBETRIQ) 50 MG TB24 tablet Take 50 mg by mouth daily.  . Omega-3 Fatty Acids (FISH OIL) 1000 MG CAPS Take 1,000 mg by mouth 2 (two) times daily.   Marland Kitchen omeprazole (PRILOSEC) 20 MG capsule TAKE 1 CAPSULE BY MOUTH ONCE DAILY  . ondansetron (ZOFRAN-ODT) 4 MG disintegrating tablet Take 1 tablet (4 mg total) by mouth every 8 (eight) hours as needed for nausea or vomiting.  . TURMERIC PO Take 1 tablet by mouth daily.   Facility-Administered Encounter Medications as of 04/08/2016  Medication  . gadopentetate dimeglumine (MAGNEVIST) injection 15 mL  . gadopentetate dimeglumine (MAGNEVIST) injection 16 mL  :  Review of Systems:  Out of a complete 14 point review of  systems, all are reviewed and negative with the exception of these symptoms as listed below:  Review of Systems  Neurological: Positive for weakness and numbness.       Pt presents today to discuss her extreme daytime sleepiness. Pt had a sleep study over 10 years ago that did not show that she had sleep apnea. However, pt does report snoring.  Epworth Sleepiness Scale 0= would never doze 1= slight chance of dozing 2= moderate chance of dozing 3= high chance of dozing  Sitting and reading: 3 Watching TV: 3 Sitting inactive in a public place (ex. Theater or meeting): 3 As a passenger in a  car for an hour without a break: 3 Lying down to rest in the afternoon: 3 Sitting and talking to someone: 1 Sitting quietly after lunch (no alcohol): 2 In a car, while stopped in traffic: 0 Total: 18   Psychiatric/Behavioral: Positive for sleep disturbance.    Objective:  Neurologic Exam  Physical Exam Physical Examination:   Vitals:   04/08/16 1556  BP: 112/71  Pulse: 83  Resp: 20   General Examination: The patient is a very pleasant 65 y.o. female in no acute distress. She appears well-developed and well-nourished and well groomed.   HEENT: Normocephalic, atraumatic, pupils are equal, round and reactive to light and accommodation. Extraocular tracking is good without limitation to gaze excursion or nystagmus noted. Normal smooth pursuit is noted. Hearing is grossly intact. Face is symmetric with normal facial animation and normal facial sensation. Speech is clear with no dysarthria noted. There is no hypophonia. There is no lip, neck/head, jaw or voice tremor. Neck is supple with full range of passive and active motion. There are no carotid bruits on auscultation. Oropharynx exam reveals: moderate mouth dryness, adequate dental hygiene and partial on top and moderate airway crowding, due to small airway and redundant soft palate. Mallampati is class III. Tongue protrudes centrally and palate  elevates symmetrically. Tonsils are absent. Neck size is 14 1/8 inches. She has a Mild overbite.    Chest: Clear to auscultation without wheezing, rhonchi or crackles noted.  Heart: S1+S2+0, regular and normal without murmurs, rubs or gallops noted.   Abdomen: Soft, non-tender and non-distended with normal bowel sounds appreciated on auscultation.  Extremities: There is no pitting edema in the distal lower extremities bilaterally. Pedal pulses are intact.  Skin: Warm and dry without trophic changes noted. There are no varicose veins.  Musculoskeletal: exam reveals no obvious joint deformities, tenderness or joint swelling or erythema, except R ankle larger, from Fx.   Neurologically:  Mental status: The patient is awake, alert and oriented in all 4 spheres. Her immediate and remote memory, attention, language skills and fund of knowledge are appropriate. There is no evidence of aphasia, agnosia, apraxia or anomia. Speech is clear with normal prosody and enunciation. Thought process is linear. Mood is normal and affect is normal.  Cranial nerves II - XII are as described above under HEENT exam. In addition: shoulder shrug is normal with equal shoulder height noted. Motor exam: Normal bulk, strength and tone is noted. There is no drift, tremor or rebound. Romberg is negative. Reflexes are 2+ throughout, with the exception of 3+ in the right knee. Babinski: Toes are flexor bilaterally. Fine motor skills and coordination: intact with normal finger taps, normal hand movements, normal rapid alternating patting, normal foot taps and normal foot agility.  Cerebellar testing: No dysmetria or intention tremor on finger to nose testing. Heel to shin is unremarkable bilaterally. There is no truncal or gait ataxia.  Sensory exam: intact to light touch, pinprick, vibration, temperature sense in the upper and lower extremities.  Gait, station and balance: She stands easily. No veering to one side is noted. No  leaning to one side is noted. Posture is age-appropriate and stance is narrow based. Gait shows normal stride length and normal pace. No problems turning are noted. Tandem walk is  slightly challenging for her, but she is wearing boots with heels.                Assessment and Plan:  In summary, Katie Maldonado is a very pleasant  65 y.o.-year old female with an underlying medical history of multiple sclerosis, osteoporosis, reflux disease, depression, anxiety and overweight state, who reports significant worsening of her excessive daytime somnolence. She was started on a stimulant about a month ago low-dose, this has been helpful slightly. She reports a high score in her Epworth sleepiness score and while she does report some snoring she does not give a telltale history for obstructive sleep apnea. Nevertheless, it is a possibility as well as she does have a small airway with redundant soft palate. She does report snoring as well. She also does not give a telltale history for narcolepsy but as you know, patients with multiple sclerosis can develop secondary narcolepsy type features. I think it is important to rule out a significant hypersomnolence disorder. I had a long chat with the patient about my findings and the diagnosis of OSA, its prognosis and treatment options. We talked about medical treatments, surgical interventions and non-pharmacological approaches. I explained in particular the risks and ramifications of untreated moderate to severe OSA, especially with respect to developing cardiovascular disease down the Road, including congestive heart failure, difficult to treat hypertension, cardiac arrhythmias, or stroke. Even type 2 diabetes has, in part, been linked to untreated OSA. Symptoms of untreated OSA include daytime sleepiness, memory problems, mood irritability and mood disorder such as depression and anxiety, lack of energy, as well as recurrent headaches, especially morning headaches. We  talked about trying to maintain a healthy lifestyle in general. She is familiar with the OSA diagnosis as her husband has sleep apnea and uses a CPAP machine. She would be willing to try CPAP if necessary. In addition, we talked about hypersomnolence disorder such as narcolepsy and potential treatments.  I advised the patient not to drive when feeling sleepy. I recommended the following at this time: sleep study  overnight, followed by next day nap study.  I explained the sleep test procedure to the patient and also  advised her that in preparation for her sleep study testing she would have to be off of her antidepressant and her stimulant medication. She is advised to taper off the Celexa by reducing it to 10 mg for a few days then stop it altogether, she is advised to discuss this with her primary care provider as well. Furthermore, she would have to be off of the Adderall XR as well. She is willing to proceed with this. We will call her to schedule her nighttime and daytime sleep studies. I would like to see her back after the sleep study is completed and encouraged her to call with any interim questions, concerns, problems or updates.  I answered all her questions today and she was in agreement.   Thank you very much for allowing me to participate in the care of this nice patient. If I can be of any further assistance to you please do not hesitate to talk to me.  Sincerely,   Star Age, MD, PhD

## 2016-04-08 NOTE — Therapy (Signed)
[place], [place], [address] Phone: [phone]   Fax:  [phone]  Physical Therapy Treatment  Patient Details  Name: Katie Maldonado MRN: [id number] Date of Birth: May 21, 1950 Referring Provider: Dr. Estanislado Pandy  Encounter Date: 04/08/2016      PT End of Session - 04/08/16 1421    Visit Number 5   Number of Visits 16   Date for PT Re-Evaluation 05/20/16   PT Start Time 1332   PT Stop Time 1425   PT Time Calculation (min) 53 min   Activity Tolerance Patient tolerated treatment well   Behavior During Therapy [facility] for tasks assessed/performed      Past Medical History:  Diagnosis Date  . Allergy   . Anxiety   . Arthritis   . Diverticulitis   . Diverticulosis 10/2015  . Hypersomnolence   . Migraine   . Multiple sclerosis ([place])   . Neuromuscular disorder ([place])   . Obesity, unspecified   . Osteoporosis   . Other and unspecified hyperlipidemia   . TIA (transient ischemic attack) 20    Past Surgical History:  Procedure Laterality Date  . ABDOMINAL HYSTERECTOMY     Fibroids  . arm surgery left    . HERNIA REPAIR    . TUBAL LIGATION    . WRIST SURGERY      There were no vitals filed for this visit.      Subjective Assessment - 04/08/16 1336    Subjective " the pain is getting better and the exercises have been helping"   Currently in Pain? Yes   Pain Score 4   took NSAID 1 hour ago   Pain Location Back   Pain Orientation Left;Lower   Pain Descriptors / Indicators Burning;Aching;Tightness   Pain Type Chronic pain   Pain Onset More than a month ago   Pain Frequency Constant   Aggravating Factors  pushing on the area,    Pain Relieving Factors changing positions, medication, heating pad                         [facility] Adult PT Treatment/Exercise - 04/08/16 0001      Lumbar Exercises: Stretches   Lower Trunk Rotation 2 reps;30 seconds   Lower Trunk Rotation  Limitations knee to chest and rotate      Knee/Hip Exercises: Stretches   Hip Flexor Stretch Left;2 reps;30 seconds   Piriformis Stretch Both;2 reps;30 seconds   Piriformis Stretch Limitations figure 4      Moist Heat Therapy   Number Minutes Moist Heat 10 Minutes   Moist Heat Location Lumbar Spine  R sidelying over rolled pillow to stretch L QL     Manual Therapy   Manual Therapy Joint mobilization   Joint Mobilization L rotation grade 2 mobs monitoring pt for pain   in R sidelying, reported decreased pain    Soft tissue mobilization L QL, traction like pressure to iliac crest, IASTM over the QL on the L and L lumbar paraspinals.    Manual Traction LLE long axis traction with slight hip abd and ER, 5 trials, 15 sec each           Trigger Point Dry Needling - 04/08/16 1415    Consent Given? Yes   Education Handout Provided Yes   Muscles Treated Upper Body Quadratus Lumborum  with twitches and muscle lengthening.  PT Education - 04/08/16 1418    Education Details reviewed DN and benefits of treatment. actions of the QL as a hip hiker muscle.    Person(s) Educated Patient   Methods Explanation;Verbal cues;Handout   Comprehension Verbalized understanding;Verbal cues required          PT Short Term Goals - 04/01/16 1527      PT SHORT TERM GOAL #1   Title Pt will be I with initial HEP    Status On-going     PT SHORT TERM GOAL #2   Title Pt will take regular standing breaks each hour to relieve back, leg pain    Baseline inconsistent   Status On-going     PT SHORT TERM GOAL #3   Title Pt will report min less pain/sensory disturbance in leg (becoming more intermittent)    Status On-going           PT Long Term Goals - 04/03/16 1517      PT LONG TERM GOAL #1   Title Pt will report at least 25% improvement in LLE numbness and tingling.     Status On-going     PT LONG TERM GOAL #2   Title Pt will be able to score FOTO 35% or less disabled  to demo improvement in functional mobility.    Status On-going     PT LONG TERM GOAL #3   Title Independent in HEP for core and LLE strengthening.   Status On-going     PT LONG TERM GOAL #4   Title Pt will be able to sit for 1 hour with min occasional pain in back, leg for work, meals    Status On-going     PT LONG TERM GOAL #5   Title Pt will be able to return to more restlful sleep, not waking due to pain with repositioning.    Status On-going               Plan - 04/08/16 1421    Clinical Impression Statement pt reported 4/10 initally inthe L QL and lumbar parapspinals. TPDN was performed onthe L QL with significant twitching and muscle lengthening. continued manual traction, and light rotational mobs, following IASTM and stretching/ mobs she reproted pain dropped to 2/10.    PT Next Visit Plan Assess response to DN,  Try some prone/ext based stab.  manual. repeat US/ITB if favorable,   Consulted and Agree with Plan of Care Patient      Patient will benefit from skilled therapeutic intervention in order to improve the following deficits and impairments:  Impaired sensation, Postural dysfunction, Decreased strength, Decreased mobility, Increased fascial restricitons, Decreased range of motion, Pain  Visit Diagnosis: Chronic midline low back pain with left-sided sciatica  Muscle weakness (generalized)  Paresthesia of skin     Problem List Patient Active Problem List   Diagnosis Date Noted  . Trochanteric bursitis of left hip 03/12/2016  . Primary osteoarthritis of both feet 03/09/2016  . Primary osteoarthritis of both knees 03/09/2016  . Degenerative cervical disc 10/04/2014  . Primary osteoarthritis of both hands 08/19/2014  . Low back pain 11/26/2013  . Numbness and tingling in left arm 07/09/2013  . Anxiety state, unspecified 12/30/2012  . Other malaise and fatigue 12/30/2012  . Osteoporosis 11/13/2012  . Incontinence of urine 04/18/2011  .  HYPERLIPIDEMIA-MIXED 01/08/2010  . MULTIPLE SCLEROSIS 01/08/2010   Starr Lake PT, DPT, LAT, ATC  04/08/16  2:36 PM      [facility] [address] [place], [place], [address] Phone: [phone]   Fax:  [phone]  Name: Katie Maldonado MRN: [id number] Date of Birth: January 06, 1951

## 2016-04-10 ENCOUNTER — Ambulatory Visit: Payer: 59 | Admitting: Physical Therapy

## 2016-04-10 DIAGNOSIS — R202 Paresthesia of skin: Secondary | ICD-10-CM | POA: Diagnosis not present

## 2016-04-10 DIAGNOSIS — G8929 Other chronic pain: Secondary | ICD-10-CM

## 2016-04-10 DIAGNOSIS — M6281 Muscle weakness (generalized): Secondary | ICD-10-CM

## 2016-04-10 DIAGNOSIS — M5442 Lumbago with sciatica, left side: Principal | ICD-10-CM

## 2016-04-10 NOTE — Therapy (Signed)
[place], [place], [address] Phone: [phone]   Fax:  [phone]  Physical Therapy Treatment  Patient Details  Name: Katie Maldonado MRN: [id number] Date of Birth: [birth year] Referring Provider: Dr. Estanislado Pandy  Encounter Date: 04/10/2016      PT End of Session - 04/10/16 1515    Visit Number 6   Number of Visits 16   Date for PT Re-Evaluation 05/20/16   PT Start Time [id number]   PT Stop Time 1530   PT Time Calculation (min) 58 min   Activity Tolerance Patient tolerated treatment well   Behavior During Therapy [facility] for tasks assessed/performed      Past Medical History:  Diagnosis Date  . Allergy   . Anxiety   . Arthritis   . Diverticulitis   . Diverticulosis 10/2015  . Hypersomnolence   . Migraine   . Multiple sclerosis ([place])   . Neuromuscular disorder ([place])   . Obesity, unspecified   . Osteoporosis   . Other and unspecified hyperlipidemia   . TIA (transient ischemic attack) 20    Past Surgical History:  Procedure Laterality Date  . ABDOMINAL HYSTERECTOMY     Fibroids  . arm surgery left    . HERNIA REPAIR    . TUBAL LIGATION    . WRIST SURGERY      There were no vitals filed for this visit.      Subjective Assessment - 04/10/16 1435    Subjective Yesterday pain went back up to 8/10 at work. Pain has been better today all day though.  I like the long stretches we did last time.    Currently in Pain? Yes   Pain Score 2    Pain Location Back   Pain Orientation Left;Lower            [facility] Adult PT Treatment/Exercise - 04/10/16 1439      Lumbar Exercises: Stretches   Lower Trunk Rotation 2 reps;10 seconds   Lower Trunk Rotation Limitations done with ball and without      Lumbar Exercises: Supine   Heel Slides 10 reps   Heel Slides Limitations with ball    Bridge 10 reps   Straight Leg Raise 10 reps     Lumbar Exercises: Prone   Straight Leg Raise 10 reps   Other Prone Lumbar Exercises isometric Transverse Abdominus x 10, prone knee flexion with Tr A x 10      Lumbar Exercises: Quadruped   Other Quadruped Lumbar Exercises childs pose x 30 sec x 2      Moist Heat Therapy   Number Minutes Moist Heat 15 Minutes   Moist Heat Location Lumbar Spine     Electrical Stimulation   Electrical Stimulation Location LLE and lumbar   Electrical Stimulation Action IFC   Electrical Stimulation Parameters 26   Electrical Stimulation Goals Pain     Manual Therapy   Manual Therapy Passive ROM   Soft tissue mobilization L QL, traction like pressure to iliac crest, IASTM over the QL on the L and L lumbar paraspinals.   prone soft tissue to low thoracic and lumbar spine   Passive ROM Lt hip ER/IR stretch, compression to piriformis                PT Education - 04/10/16 1515    Education provided Yes   Education Details prone stab ex    Person(s) Educated Patient   Methods Explanation   Comprehension Verbalized  understanding          PT Short Term Goals - 04/10/16 1521      PT SHORT TERM GOAL #1   Title Pt will be I with initial HEP    Status Achieved     PT SHORT TERM GOAL #2   Title Pt will take regular standing breaks each hour to relieve back, leg pain    Baseline inconsistent   Status On-going     PT SHORT TERM GOAL #3   Title Pt will report min less pain/sensory disturbance in leg (becoming more intermittent)    Status Achieved           PT Long Term Goals - 04/10/16 1517      PT LONG TERM GOAL #1   Title Pt will report at least 25% improvement in LLE numbness and tingling.     Status On-going     PT LONG TERM GOAL #2   Title Pt will be able to score FOTO 35% or less disabled to demo improvement in functional mobility.    Status On-going     PT LONG TERM GOAL #3   Title Independent in HEP for core and LLE strengthening.   Status On-going     PT LONG TERM GOAL #4   Title Pt will be able to sit for 1 hour with  min occasional pain in back, leg for work, meals    Status On-going     PT LONG TERM GOAL #5   Title Pt will be able to return to more restlful sleep, not waking due to pain with repositioning.    Baseline much improved    Status Achieved               Plan - 04/10/16 1516    Clinical Impression Statement Pain improved today after an increase yesterday.  She has less tenderness thorughout L side of lumbar and hip.  Cont with vague symptoms pain and discomfort in LLE. No pain after session.     PT Next Visit Plan Cont DN, ensure I with HEP, especially stabilization.     PT Home Exercise Plan childs pose, bird dog, piriformis and hamstring stretch , prepilates    Consulted and Agree with Plan of Care Patient      Patient will benefit from skilled therapeutic intervention in order to improve the following deficits and impairments:  Impaired sensation, Postural dysfunction, Decreased strength, Decreased mobility, Increased fascial restricitons, Decreased range of motion, Pain  Visit Diagnosis: Chronic midline low back pain with left-sided sciatica  Muscle weakness (generalized)  Paresthesia of skin     Problem List Patient Active Problem List   Diagnosis Date Noted  . Trochanteric bursitis of left hip 03/12/2016  . Primary osteoarthritis of both feet 03/09/2016  . Primary osteoarthritis of both knees 03/09/2016  . Degenerative cervical disc 10/04/2014  . Primary osteoarthritis of both hands 08/19/2014  . Low back pain 11/26/2013  . Numbness and tingling in left arm 07/09/2013  . Anxiety state, unspecified 12/30/2012  . Other malaise and fatigue 12/30/2012  . Osteoporosis 11/13/2012  . Incontinence of urine 04/18/2011  . HYPERLIPIDEMIA-MIXED 01/08/2010  . MULTIPLE SCLEROSIS 01/08/2010    Aune Adami 04/10/2016, 3:32 PM  [facility] [address] [place], [place], [address] Phone: [phone]   Fax:   [phone]  Name: Katie Maldonado MRN: [id number] Date of Birth: June 21, 1950   Raeford Razor, PT 04/10/16 3:32 PM Phone: [phone] Fax: [phone]

## 2016-04-15 ENCOUNTER — Ambulatory Visit: Payer: 59 | Admitting: Physical Therapy

## 2016-04-15 DIAGNOSIS — R202 Paresthesia of skin: Secondary | ICD-10-CM | POA: Diagnosis not present

## 2016-04-15 DIAGNOSIS — M5442 Lumbago with sciatica, left side: Principal | ICD-10-CM

## 2016-04-15 DIAGNOSIS — M6281 Muscle weakness (generalized): Secondary | ICD-10-CM | POA: Diagnosis not present

## 2016-04-15 DIAGNOSIS — G8929 Other chronic pain: Secondary | ICD-10-CM

## 2016-04-15 NOTE — Therapy (Signed)
[place], [place], [address] Phone: [phone]   Fax:  [phone]  Physical Therapy Treatment  Patient Details  Name: Katie Maldonado MRN: [id number] Date of Birth: 1950/05/27 Referring Provider: Dr. Estanislado Pandy  Encounter Date: 04/15/2016      PT End of Session - 04/15/16 1420    Visit Number 7   Number of Visits 16   Date for PT Re-Evaluation 05/20/16   PT Start Time 1331   PT Stop Time 1426   PT Time Calculation (min) 55 min   Activity Tolerance Patient tolerated treatment well   Behavior During Therapy [facility] for tasks assessed/performed      Past Medical History:  Diagnosis Date  . Allergy   . Anxiety   . Arthritis   . Diverticulitis   . Diverticulosis 10/2015  . Hypersomnolence   . Migraine   . Multiple sclerosis ([place])   . Neuromuscular disorder ([place])   . Obesity, unspecified   . Osteoporosis   . Other and unspecified hyperlipidemia   . TIA (transient ischemic attack) 20    Past Surgical History:  Procedure Laterality Date  . ABDOMINAL HYSTERECTOMY     Fibroids  . arm surgery left    . HERNIA REPAIR    . TUBAL LIGATION    . WRIST SURGERY      There were no vitals filed for this visit.      Subjective Assessment - 04/15/16 1335    Subjective "I am feeling better, the relief started after the 2nd day from DN" pt reports some soreness in the low back today"    Currently in Pain? Yes   Pain Score 2    Pain Location Back   Pain Orientation Left   Pain Descriptors / Indicators Aching   Pain Type Chronic pain   Pain Onset More than a month ago   Pain Frequency Intermittent   Aggravating Factors  prolonged sitting   Pain Relieving Factors changing positions, medication, heating pad                         [facility] Adult PT Treatment/Exercise - 04/15/16 0001      Lumbar Exercises: Aerobic   Stationary Bike NuStep L5 UE and LE for 5 min      Lumbar  Exercises: Seated   Other Seated Lumbar Exercises seated marching 2 x 10 seated on dynadisc, Seated pelvic tilting 2 x 10 5 sec hold   seated pelvic tilt given as HEP     Lumbar Exercises: Supine   Other Supine Lumbar Exercises seated pelvic tilt 2 x 15 holding at end range     Moist Heat Therapy   Number Minutes Moist Heat 10 Minutes   Moist Heat Location Lumbar Spine  in supine     Ultrasound   Ultrasound Location R paraspinals   Ultrasound Parameters R Lumbar paraspinals 100% 1 mhz, 1.2 w/cm2   Ultrasound Goals Pain     Manual Therapy   Manual Therapy Passive ROM   Joint Mobilization grade 2-3 PA lumbar mobs, unilateral mobs grade on the RLE   Soft tissue mobilization IASTM of bil lumbar paraspinals          Trigger Point Dry Needling - 04/15/16 1343    Consent Given? Yes   Education Handout Provided Yes  given previously   Muscles Treated Upper Body Longissimus   Longissimus Response Twitch response elicited;Palpable increased muscle length  L1-L3  bil              PT Education - 04/15/16 1419    Education provided Yes   Education Details updated HEP for seated pelvic tilts to promote posture with prolong sitting, and setting up visual cues around work station to remind of correct posture.   Person(s) Educated Patient   Methods Explanation;Verbal cues;Handout   Comprehension Verbalized understanding;Verbal cues required          PT Short Term Goals - 04/10/16 1521      PT SHORT TERM GOAL #1   Title Pt will be I with initial HEP    Status Achieved     PT SHORT TERM GOAL #2   Title Pt will take regular standing breaks each hour to relieve back, leg pain    Baseline inconsistent   Status On-going     PT SHORT TERM GOAL #3   Title Pt will report min less pain/sensory disturbance in leg (becoming more intermittent)    Status Achieved           PT Long Term Goals - 04/10/16 1517      PT LONG TERM GOAL #1   Title Pt will report at least 25%  improvement in LLE numbness and tingling.     Status On-going     PT LONG TERM GOAL #2   Title Pt will be able to score FOTO 35% or less disabled to demo improvement in functional mobility.    Status On-going     PT LONG TERM GOAL #3   Title Independent in HEP for core and LLE strengthening.   Status On-going     PT LONG TERM GOAL #4   Title Pt will be able to sit for 1 hour with min occasional pain in back, leg for work, meals    Status On-going     PT LONG TERM GOAL #5   Title Pt will be able to return to more restlful sleep, not waking due to pain with repositioning.    Baseline much improved    Status Achieved               Plan - 04/15/16 1421    Clinical Impression Statement Mrs. Dechene reported 2/10 pain beginning todays session. DN was performed on bil lumbar paraspinals followed with soft tissue work. she performed all exericses well with no reprot of pain or sorenes. continued MHP post session to address DN soreness.    PT Next Visit Plan Cont DN, ensure I with HEP, especially stabilization.     PT Home Exercise Plan childs pose, bird dog, piriformis and hamstring stretch , prepilates, seated pelvic tilts   Consulted and Agree with Plan of Care Patient      Patient will benefit from skilled therapeutic intervention in order to improve the following deficits and impairments:  Impaired sensation, Postural dysfunction, Decreased strength, Decreased mobility, Increased fascial restricitons, Decreased range of motion, Pain  Visit Diagnosis: Chronic midline low back pain with left-sided sciatica  Muscle weakness (generalized)  Paresthesia of skin     Problem List Patient Active Problem List   Diagnosis Date Noted  . Trochanteric bursitis of left hip 03/12/2016  . Primary osteoarthritis of both feet 03/09/2016  . Primary osteoarthritis of both knees 03/09/2016  . Degenerative cervical disc 10/04/2014  . Primary osteoarthritis of both hands 08/19/2014  .  Low back pain 11/26/2013  . Numbness and tingling in left arm 07/09/2013  . Anxiety state, unspecified 12/30/2012  .  Other malaise and fatigue 12/30/2012  . Osteoporosis 11/13/2012  . Incontinence of urine 04/18/2011  . HYPERLIPIDEMIA-MIXED 01/08/2010  . MULTIPLE SCLEROSIS 01/08/2010   Starr Lake PT, DPT, LAT, ATC  04/15/16  2:28 PM      [facility] [address] [place], [place], [address] Phone: [phone]   Fax:  [phone]  Name: Katie AMBROCIO MRN: [id number] Date of Birth: 28-Dec-1950

## 2016-04-16 MED FILL — MYRBETRIQ ER 50 MG TABLET: 50 | 30 days supply | Qty: 30 | Fill #6

## 2016-04-17 ENCOUNTER — Ambulatory Visit: Payer: 59 | Admitting: Physical Therapy

## 2016-04-17 DIAGNOSIS — G8929 Other chronic pain: Secondary | ICD-10-CM | POA: Diagnosis not present

## 2016-04-17 DIAGNOSIS — R202 Paresthesia of skin: Secondary | ICD-10-CM | POA: Diagnosis not present

## 2016-04-17 DIAGNOSIS — M6281 Muscle weakness (generalized): Secondary | ICD-10-CM

## 2016-04-17 DIAGNOSIS — M5442 Lumbago with sciatica, left side: Secondary | ICD-10-CM | POA: Diagnosis not present

## 2016-04-17 NOTE — Therapy (Addendum)
[place], [place], [address] Phone: [phone]   Fax:  [phone]  Physical Therapy Treatment/Renewal  Patient Details  Name: Katie Maldonado MRN: [id number] Date of Birth: Sep 25, 1950 Referring Provider: Dr. Estanislado Pandy  Encounter Date: 04/17/2016      PT End of Session - 04/17/16 1432    Visit Number 8   Number of Visits 16   Date for PT Re-Evaluation 05/20/16   PT Start Time 1332   PT Stop Time 1420   PT Time Calculation (min) 48 min   Activity Tolerance Patient tolerated treatment well   Behavior During Therapy [facility] for tasks assessed/performed      Past Medical History:  Diagnosis Date  . Allergy   . Anxiety   . Arthritis   . Diverticulitis   . Diverticulosis 10/2015  . Hypersomnolence   . Migraine   . Multiple sclerosis ([place])   . Neuromuscular disorder ([place])   . Obesity, unspecified   . Osteoporosis   . Other and unspecified hyperlipidemia   . TIA (transient ischemic attack) 20    Past Surgical History:  Procedure Laterality Date  . ABDOMINAL HYSTERECTOMY     Fibroids  . arm surgery left    . HERNIA REPAIR    . TUBAL LIGATION    . WRIST SURGERY      There were no vitals filed for this visit.      Subjective Assessment - 04/17/16 1343    Subjective Pain in low back 2/10.  no leg pain but a little numbness.     Currently in Pain? Yes   Pain Score 2    Pain Location Back   Pain Orientation Left   Pain Descriptors / Indicators Aching   Pain Type Chronic pain   Pain Onset More than a month ago   Pain Frequency Intermittent                         [facility] Adult PT Treatment/Exercise - 04/17/16 1351      Lumbar Exercises: Stretches   Quadruped Mid Back Stretch 3 reps;20 seconds   Quadruped Mid Back Stretch Limitations sink stretch for "traction-effect   Piriformis Stretch 1 rep;60 seconds     Lumbar Exercises: Standing   Other Standing Lumbar  Exercises mini squat with horizontal pull and diagonal pull green band each UE x 15 reps      Lumbar Exercises: Supine   Clam 20 reps   Clam Limitations unilateral and bilateral used ball under feet to destabilize    Bridge 20 reps   Straight Leg Raise 10 reps   Other Supine Lumbar Exercises core alternating dead bug x 15 each side with ball under sacrum    Other Supine Lumbar Exercises Pilates Reformer: feet in straps 1 Red 1 yellow arcs to work on core control and disassociation of hips and spine.  Lost control and hips extended into the well without injury.  Standing cat stretch 1 red for spinal articulation.      Manual Therapy   Soft tissue mobilization bilateral lumbar paraspinals , more painful on R.t side than L as previous. declined MHP                 PT Education - 04/17/16 1430    Education provided Yes   Education Details Stabilization is as important as stretching! Include in HEP routine    Person(s) Educated Patient   Methods Explanation;Demonstration  Comprehension Verbalized understanding;Returned demonstration;Need further instruction          PT Short Term Goals - 04/17/16 1434      PT SHORT TERM GOAL #1   Title Pt will be I with initial HEP    Status Achieved     PT SHORT TERM GOAL #2   Title Pt will take regular standing breaks each hour to relieve back, leg pain    Status Achieved     PT SHORT TERM GOAL #3   Title Pt will report min less pain/sensory disturbance in leg (becoming more intermittent)    Status Achieved           PT Long Term Goals - 04/17/16 1434      PT LONG TERM GOAL #1   Title Pt will report at least 25% improvement in LLE numbness and tingling.     Status Partially Met     PT LONG TERM GOAL #2   Title Pt will be able to score FOTO 35% or less disabled to demo improvement in functional mobility.    Status Unable to assess     PT LONG TERM GOAL #3   Title Independent in HEP for core and LLE strengthening.   Status  On-going     PT LONG TERM GOAL #4   Title Pt will be able to sit for 1 hour with min occasional pain in back, leg for work, meals    Status On-going     PT LONG TERM GOAL #5   Title Pt will be able to return to more restlful sleep, not waking due to pain with repositioning.    Status Achieved               Plan - 04/17/16 1432    Clinical Impression Statement Patient has made good progress thus far, able to report a centralization of pain.  She has some tightness in low back and occ into LLE which she can manage with stretches. She has lower abdominal weakness and lacks control of pelvis with more functional movements.  She is not sure if she will cont with PT as her deductible restarts in 2018.     PT Next Visit Plan Cont DN, ensure I with HEP, especially stabilization.     PT Home Exercise Plan childs pose, bird dog, piriformis and hamstring stretch , prepilates, seated pelvic tilts      Patient will benefit from skilled therapeutic intervention in order to improve the following deficits and impairments:  Impaired sensation, Postural dysfunction, Decreased strength, Decreased mobility, Increased fascial restricitons, Decreased range of motion, Pain  Visit Diagnosis: Chronic midline low back pain with left-sided sciatica  Muscle weakness (generalized)  Paresthesia of skin     Problem List Patient Active Problem List   Diagnosis Date Noted  . Trochanteric bursitis of left hip 03/12/2016  . Primary osteoarthritis of both feet 03/09/2016  . Primary osteoarthritis of both knees 03/09/2016  . Degenerative cervical disc 10/04/2014  . Primary osteoarthritis of both hands 08/19/2014  . Low back pain 11/26/2013  . Numbness and tingling in left arm 07/09/2013  . Anxiety state, unspecified 12/30/2012  . Other malaise and fatigue 12/30/2012  . Osteoporosis 11/13/2012  . Incontinence of urine 04/18/2011  . HYPERLIPIDEMIA-MIXED 01/08/2010  . MULTIPLE SCLEROSIS 01/08/2010     PAA,JENNIFER 04/17/2016, 2:35 PM  [facility] [address] [place], [place], [address] Phone: [phone]   Fax:  [phone]  Name: Katie Corella  Maldonado MRN: [id number] Date of Birth: [birth year]  Raeford Razor, PT 04/17/16 2:35 PM Phone: [phone] Fax: [phone]  PHYSICAL THERAPY DISCHARGE SUMMARY  Visits from Start of Care: 8  Current functional level related to goals / functional outcomes: See above for most recent info    Remaining deficits: Unknown as of this date     Education / Equipment: Posture, dry needling, HEP  Plan: Patient agrees to discharge.  Patient goals were not met. Patient is being discharged due to financial reasons.  ?????    Raeford Razor, PT 05/13/16 9:09 AM Phone: [phone] Fax: [phone]

## 2016-04-25 ENCOUNTER — Ambulatory Visit (INDEPENDENT_AMBULATORY_CARE_PROVIDER_SITE_OTHER): Payer: 59 | Admitting: Neurology

## 2016-04-25 DIAGNOSIS — G4720 Circadian rhythm sleep disorder, unspecified type: Secondary | ICD-10-CM

## 2016-04-25 DIAGNOSIS — G4710 Hypersomnia, unspecified: Secondary | ICD-10-CM | POA: Diagnosis not present

## 2016-04-25 DIAGNOSIS — G4719 Other hypersomnia: Secondary | ICD-10-CM

## 2016-04-25 DIAGNOSIS — R0683 Snoring: Secondary | ICD-10-CM

## 2016-04-26 ENCOUNTER — Ambulatory Visit (INDEPENDENT_AMBULATORY_CARE_PROVIDER_SITE_OTHER): Payer: 59 | Admitting: Neurology

## 2016-04-26 DIAGNOSIS — G47429 Narcolepsy in conditions classified elsewhere without cataplexy: Secondary | ICD-10-CM

## 2016-04-26 DIAGNOSIS — G4710 Hypersomnia, unspecified: Secondary | ICD-10-CM | POA: Diagnosis not present

## 2016-04-26 DIAGNOSIS — R0683 Snoring: Secondary | ICD-10-CM

## 2016-04-26 DIAGNOSIS — G4720 Circadian rhythm sleep disorder, unspecified type: Secondary | ICD-10-CM

## 2016-04-26 DIAGNOSIS — G4719 Other hypersomnia: Secondary | ICD-10-CM

## 2016-05-03 ENCOUNTER — Encounter: Payer: Self-pay | Admitting: Diagnostic Neuroimaging

## 2016-05-03 NOTE — Progress Notes (Signed)
Patient referred by Dr. Leta Baptist, seen by me on 04/08/16, diagnostic PSG on 04/25/16, MSLT on 04/26/16.   Please call and notify the patient that the recent sleep study did not show any significant obstructive sleep apnea or leg twitching. This study was followed by a next day nap study, MSLT, on 04/26/16.  The mean sleep latency was 16.4 minutes for 5 naps with no sleep onset REM periods noted. This indicates no significant daytime somnolence and findings do not support the diagnosis of narcolepsy or idiopathic hypersomnolence.  Please inform patient that she can FU with Dr. Leta Baptist as planned. Please remind patient to try to maintain good sleep hygiene, which means: Keep a regular sleep and wake schedule, try not to exercise or have a meal within 2 hours of your bedtime, try to keep your bedroom conducive for sleep, that is, cool and dark, without light distractors such as an illuminated alarm clock, and refrain from watching TV right before sleep or in the middle of the night and do not keep the TV or radio on during the night. Also, try not to use or play on electronic devices at bedtime, such as your cell phone, tablet PC or laptop. If you like to read at bedtime on an electronic device, try to dim the background light as much as possible. Do not eat in the middle of the night.   Also, route or fax report to PCP and referring MD, if other than PCP.  Once you have spoken to patient, you can close this encounter.   Thanks,  Star Age, MD, PhD [facility])

## 2016-05-03 NOTE — Procedures (Signed)
PATIENT'S NAME:  Katie Maldonado, Katie Maldonado DOB:      1950-11-18      MR#:    [id number]     DATE OF RECORDING: 04/25/2016 REFERRING M.D.:  Clarene Reamer, FNP Study Performed:   Baseline Polysomnogram HISTORY: Ms. Poellnitz is a 66 year old woman with a history of multiple sclerosis, osteoporosis, reflux disease, depression, anxiety and overweight state, who reports snoring and excessive daytime somnolence. The patient endorsed the Epworth Sleepiness Scale at 18/24 points. The patient's weight 157 pounds with a height of 64 (inches), resulting in a BMI of 26.7 kg/m2. The patient's neck circumference measured 14 inches.  CURRENT MEDICATIONS: Fosamax, Adderal, Aspirin, Vitamin D, Celexa, Tecfidera, Advil, Myrbetriq, Fish Oil, Prilosec, Zofran-ODT, Turmeric PO, Magnevist   PROCEDURE:  This is a multichannel digital polysomnogram utilizing the Somnostar 11.2 system.  Electrodes and sensors were applied and monitored per AASM Specifications.   EEG, EOG, Chin and Limb EMG, were sampled at 200 Hz.  ECG, Snore and Nasal Pressure, Thermal Airflow, Respiratory Effort, CPAP Flow and Pressure, Oximetry was sampled at 50 Hz. Digital video and audio were recorded.      BASELINE STUDY  Lights Out was at 21:16 and Lights On at 05:33.  Total recording time (TRT) was 497.5 minutes, with a total sleep time (TST) of  438.5 minutes.   The patient's sleep latency was 31.5 minutes, which is mildly delayed.  REM latency was 153 minutes, which is delayed.  The sleep efficiency was 88.1 %.     SLEEP ARCHITECTURE: WASO (Wake after sleep onset) was 24.5 minutes with one longer period of wakefulness, but otherwise no significant sleep fragmentation.  There were 4.5 minutes in Stage N1, 301 minutes Stage N2, 0 minutes Stage N3 and 133 minutes in Stage REM.  The percentage of Stage N1 was 1.%, Stage N2 was 68.6%, which is increased, Stage N3 was absent, and Stage R (REM sleep) was 30.3%, which is increased. She had 2 longer periods  of REM sleep.   The arousals were noted as: 23 were spontaneous, 0 were associated with PLMs, 2 were associated with respiratory events.  Audio and video analysis did not show any abnormal or unusual movements, behaviors, phonations or vocalizations.  The patient took 1 bathroom break. Mild snoring was noted. The EKG was in keeping with normal sinus rhythm (NSR).  RESPIRATORY ANALYSIS:  There were a total of 2 respiratory events:  0 obstructive apneas, 2 central apneas and 0 mixed apneas with a total of 2 apneas and an apnea index (AI) of .3 /hour. There were 0 hypopneas with a hypopnea index of 0 /hour. The patient also had 0 respiratory event related arousals (RERAs).      The total APNEA/HYPOPNEA INDEX (AHI) was .3/hour and the total RESPIRATORY DISTURBANCE INDEX was .3 /hour.  2 events occurred in REM sleep and 0 events in NREM. The REM AHI was .9 /hour, versus a non-REM AHI of 0. The patient spent 77 minutes of total sleep time in the supine position and 362 minutes in non-supine.. The supine AHI was 0.0 versus a non-supine AHI of 0.3.  OXYGEN SATURATION & C02:  The Wake baseline 02 saturation was 94%, with the lowest being 87%. Time spent below 89% saturation equaled 0 minutes.  PERIODIC LIMB MOVEMENTS:   The patient had a total of 8 Periodic Limb Movements.  The Periodic Limb Movement (PLM) index was 1.1 and the PLM Arousal index was 0/hour.  Post-study, the patient indicated that sleep was the same as  usual.   This study was followed by a next day nap study, MSLT, on 04/26/16.  The mean sleep latency was 16.4 minutes for 5 naps with no sleep onset REM periods noted. This indicates no significant daytime somnolence and findings do not support the diagnosis of narcolepsy or idiopathic hypersomnolence.   IMPRESSION: 1. Hypersomnia, unspecified 2. Primary Snoring  3. Dysfunctions associated with sleep stages or arousal from sleep  RECOMMENDATIONS: 1. This study does not demonstrate any  significant obstructive or central sleep disordered breathing, except for mild snoring. This study does not support an intrinsic sleep disorder as a cause of the patient's symptoms. Other causes, including circadian rhythm disturbances, an underlying mood disorder, medication effect and/or an underlying medical problem cannot be ruled out. The sleep study and nap study do not support the diagnosis of narcolepsy or idiopathic hypersomnolence.  2. The patient should be cautioned not to drive, work at heights, or operate dangerous or heavy equipment when tired or sleepy. Review and reiteration of good sleep hygiene measures should be pursued with any patient. 3. This study shows sleep fragmentation and abnormal sleep stage percentages; these are nonspecific findings and per se do not signify an intrinsic sleep disorder or a cause for the patient's sleep-related symptoms. Causes include (but are not limited to) the first night effect of the sleep study, circadian rhythm disturbances, medication effect or an underlying mood disorder or medical problem.  4. The patient can follow-up with her referring provider.   I certify that I have reviewed the entire raw data recording prior to the issuance of this report in accordance with the Standards of Accreditation of the American Academy of Sleep Medicine (AASM)    Star Age, MD, PhD Diplomat, American Board of Psychiatry and Neurology (Neurology and Sleep Medicine)

## 2016-05-06 ENCOUNTER — Other Ambulatory Visit: Payer: Self-pay | Admitting: Physician Assistant

## 2016-05-06 ENCOUNTER — Telehealth: Payer: Self-pay

## 2016-05-06 NOTE — Telephone Encounter (Signed)
LM for patient to call back for results

## 2016-05-06 NOTE — Telephone Encounter (Signed)
-----   Message from Star Age, MD sent at 05/03/2016  3:04 PM EST ----- Patient referred by Dr. Leta Baptist, seen by me on 04/08/16, diagnostic PSG on 04/25/16, MSLT on 04/26/16.   Please call and notify the patient that the recent sleep study did not show any significant obstructive sleep apnea or leg twitching. This study was followed by a next day nap study, MSLT, on 04/26/16.  The mean sleep latency was 16.4 minutes for 5 naps with no sleep onset REM periods noted. This indicates no significant daytime somnolence and findings do not support the diagnosis of narcolepsy or idiopathic hypersomnolence.  Please inform patient that she can FU with Dr. Leta Baptist as planned. Please remind patient to try to maintain good sleep hygiene, which means: Keep a regular sleep and wake schedule, try not to exercise or have a meal within 2 hours of your bedtime, try to keep your bedroom conducive for sleep, that is, cool and dark, without light distractors such as an illuminated alarm clock, and refrain from watching TV right before sleep or in the middle of the night and do not keep the TV or radio on during the night. Also, try not to use or play on electronic devices at bedtime, such as your cell phone, tablet PC or laptop. If you like to read at bedtime on an electronic device, try to dim the background light as much as possible. Do not eat in the middle of the night.   Also, route or fax report to PCP and referring MD, if other than PCP.  Once you have spoken to patient, you can close this encounter.   Thanks,  Star Age, MD, PhD [facility])

## 2016-05-06 NOTE — Telephone Encounter (Signed)
I spoke to patient and she is aware of results and recommendations. She will f/u with Dr. Leta Baptist as scheduled. Copy of report sent to PCP.

## 2016-05-07 ENCOUNTER — Other Ambulatory Visit: Payer: Self-pay | Admitting: Diagnostic Neuroimaging

## 2016-05-07 ENCOUNTER — Other Ambulatory Visit: Payer: Self-pay | Admitting: Physician Assistant

## 2016-05-07 MED FILL — CITALOPRAM HBR 20 MG TABLET: 20 | 15 days supply | Qty: 15 | Fill #0

## 2016-05-08 ENCOUNTER — Encounter: Payer: Self-pay | Admitting: *Deleted

## 2016-05-08 MED ORDER — AMPHETAMINE-DEXTROAMPHET ER 20 MG PO CP24: 20.0000 mg | ORAL_CAPSULE | Freq: Every day | ORAL | 0 refills | Status: DC

## 2016-05-08 NOTE — Procedures (Signed)
Name:  Katie Maldonado, Katie Maldonado Reference [id number]  Study Date: 04/26/2016 Procedure #: 64  DOB: December 31, 1950    History: 66 year old woman with a history of multiple sclerosis, osteoporosis, reflux disease, depression, anxiety and overweight state, who reports snoring and excessive daytime somnolence. The patient endorsed the Epworth Sleepiness Scale at 18/24 points. The patient's weight 157 pounds with a height of 64 (inches), resulting in a BMI of 26.7 kg/m2. The patient's neck circumference measured 14 inches. The study was preceded by a nocturnal PSG on 04/25/16, which showed an AHI of 0.3/hour, O2 nadir of 87%.   Protocol  This study was preceded by an overnight polysomnogram with a total sleep time (TST) of 438.5 minutes.       This is a 13 channel Multiple Sleep Latency Test comprised of 5 channels of EEG (T3-Cz, Cz-T4, F4-M1, C4-M1, O2-M1), 3 channels of Chin EMG, 4 channels of EOG and 1 channel for ECG.   All channels were sampled at 256hz .    This polysomnographic procedure is designed to evaluate (1) the complaint of excessive daytime sleepiness by quantifying the time required to fall asleep and (2) the possibility of narcolepsy by checking for abnormally short latencies to REM sleep.  Electrographic variables include EEG, EMG, EOG and ECG.  Patients are monitored throughout four or five 20-minute opportunities to sleep (naps) at two-hour intervals.  For each nap, the patient is allowed 20 minutes to fall asleep.  Once asleep, the patient is awakened after 15 minutes.  Between naps, the patient is kept as alert as possible.  A sleep latency of 20 minutes indicates that no sleep occurred.  Parametric Analysis  Total Number of Naps 5     NAP # Time of Nap  Sleep Latency (mins) REM Latency (mins) Sleep Time Percent Awake Time Percent  1 07:00 20 0 0 100   2 0900            18   0 45  55   3 1055 17 0 44            56   4 1251 20 0             0  100   5 1438  7 0 68  32     MSLT Summary of Naps  Sleepiness Index: 8  Mean Sleep Latency to all Five Naps: 16.4  Number of Naps with REM Sleep: 0    Results from Preceding PSG Study  Sleep Onset Time 21:46 Sleep Efficiency (%) 88.1  Rise Time 05:33 Sleep Latency (min) 31.5  Total Sleep Time  438.5 REM Latency (min) 133    I attest to having reviewed every epoch of the entire raw data recording prior to the issuance of this report in accordance with the Standards of the American Academy of Sleep Medicine.    IMPRESSION:  1. Hypersomnia, unspecified 2. Primary Snoring  3.   Dysfunctions associated with sleep stages or arousal from sleep   Name:  Katie, Maldonado Reference #:  [id number]  Study Date: 04/26/2016 DOB: [birth year]    RECOMMENDATIONS:  1. This study and the preceding PSG the night before do not demonstrate any significant obstructive or central sleep disordered breathing, except for mild snoring. These studies do not support an intrinsic sleep disorder as a cause of the patient's symptoms. Other causes, including circadian rhythm disturbances, an underlying mood disorder, medication effect and/or an underlying medical problem cannot be ruled out. The sleep study and nap study do  not support the diagnosis of narcolepsy or idiopathic hypersomnolence.  2. The patient should be cautioned not to drive, work at heights, or operate dangerous or heavy equipment when tired or sleepy. Review and reiteration of good sleep hygiene measures should be pursued with any patient. 3. The NPSG shows sleep fragmentation and abnormal sleep stage percentages; these are nonspecific findings and per se do not signify an intrinsic sleep disorder or a cause for the patient's sleep-related symptoms. Causes include (but are not limited to) the first night effect of the sleep study, circadian rhythm disturbances, medication effect or an underlying mood disorder or medical problem.  4. The patient can follow-up with her  referring provider.    Star Age, MD, PhD Diplomat, American Board of Psychiatry and Neurology (Neurology and Sleep Medicine)

## 2016-05-08 NOTE — Telephone Encounter (Signed)
rx signed. -VRP 

## 2016-05-08 NOTE — Telephone Encounter (Signed)
Prescription ready for pick up at front desk, My Chart message sent to patient.

## 2016-05-10 ENCOUNTER — Encounter: Payer: Self-pay | Admitting: Physician Assistant

## 2016-05-13 ENCOUNTER — Other Ambulatory Visit: Payer: Self-pay | Admitting: Physician Assistant

## 2016-05-13 MED FILL — ADDERALL XR 20 MG CAP SA: 20 | 30 days supply | Qty: 30 | Fill #0

## 2016-05-13 NOTE — Telephone Encounter (Signed)
10/2015 LAST OV FOR STOMACHE

## 2016-05-14 MED ORDER — OMEPRAZOLE 20 MG PO CPDR: 20.0000 mg | DELAYED_RELEASE_CAPSULE | Freq: Every day | ORAL | 0 refills | Status: DC

## 2016-05-14 NOTE — Telephone Encounter (Signed)
Please call pt and let her know I have put in a courtesy refill but I am going to need her to come in for future refills as I have not filled this for her before and do not know why she is on it chronically. Thanks!

## 2016-05-15 ENCOUNTER — Encounter: Payer: 59 | Admitting: Physician Assistant

## 2016-05-20 ENCOUNTER — Other Ambulatory Visit: Payer: Self-pay | Admitting: Physician Assistant

## 2016-05-20 ENCOUNTER — Encounter: Payer: Self-pay | Admitting: Physician Assistant

## 2016-05-20 DIAGNOSIS — Z1231 Encounter for screening mammogram for malignant neoplasm of breast: Secondary | ICD-10-CM

## 2016-05-21 ENCOUNTER — Encounter: Payer: Self-pay | Admitting: *Deleted

## 2016-05-21 ENCOUNTER — Telehealth: Payer: Self-pay | Admitting: *Deleted

## 2016-05-21 NOTE — Telephone Encounter (Signed)
Received letter form patient for tecfidera PA through [employer] , [place] dept. Form completed and faxed. Sent patient my chart message to inform.

## 2016-05-22 ENCOUNTER — Other Ambulatory Visit: Payer: Self-pay

## 2016-05-22 DIAGNOSIS — M810 Age-related osteoporosis without current pathological fracture: Secondary | ICD-10-CM

## 2016-05-24 ENCOUNTER — Other Ambulatory Visit: Payer: Self-pay | Admitting: Physician Assistant

## 2016-05-24 MED ORDER — OMEPRAZOLE 20 MG PO CPDR: 20.0000 mg | DELAYED_RELEASE_CAPSULE | Freq: Every day | ORAL | 0 refills | Status: DC

## 2016-05-24 MED FILL — OMEPRAZOLE 20 MG CAPSULE DR: 20 | 15 days supply | Qty: 15 | Fill #0

## 2016-05-28 ENCOUNTER — Ambulatory Visit (INDEPENDENT_AMBULATORY_CARE_PROVIDER_SITE_OTHER): Payer: 59 | Admitting: Physician Assistant

## 2016-05-28 ENCOUNTER — Telehealth: Payer: 59 | Admitting: Family

## 2016-05-28 VITALS — BP 122/72 | HR 75 | Temp 98.1°F | Resp 17 | Ht 64.5 in | Wt 160.0 lb

## 2016-05-28 DIAGNOSIS — R8299 Other abnormal findings in urine: Secondary | ICD-10-CM

## 2016-05-28 DIAGNOSIS — R0789 Other chest pain: Secondary | ICD-10-CM

## 2016-05-28 DIAGNOSIS — R82998 Other abnormal findings in urine: Secondary | ICD-10-CM

## 2016-05-28 DIAGNOSIS — R1030 Lower abdominal pain, unspecified: Secondary | ICD-10-CM | POA: Diagnosis not present

## 2016-05-28 DIAGNOSIS — R300 Dysuria: Secondary | ICD-10-CM

## 2016-05-28 DIAGNOSIS — R112 Nausea with vomiting, unspecified: Secondary | ICD-10-CM | POA: Diagnosis not present

## 2016-05-28 DIAGNOSIS — A084 Viral intestinal infection, unspecified: Secondary | ICD-10-CM | POA: Diagnosis not present

## 2016-05-28 LAB — POCT URINALYSIS DIP (MANUAL ENTRY)
Bilirubin, UA: NEGATIVE
GLUCOSE UA: NEGATIVE
Ketones, POC UA: NEGATIVE
NITRITE UA: NEGATIVE
PH UA: 6.5
Protein Ur, POC: NEGATIVE
RBC UA: NEGATIVE
SPEC GRAV UA: 1.015
UROBILINOGEN UA: 0.2

## 2016-05-28 LAB — POC MICROSCOPIC URINALYSIS (UMFC): Mucus: ABSENT

## 2016-05-28 LAB — POCT CBC
GRANULOCYTE PERCENT: 74.2 % (ref 37–80)
HCT, POC: 37.1 % — AB (ref 37.7–47.9)
HEMOGLOBIN: 12.8 g/dL (ref 12.2–16.2)
Lymph, poc: 0.6 (ref 0.6–3.4)
MCH: 28.9 pg (ref 27–31.2)
MCHC: 34.5 g/dL (ref 31.8–35.4)
MCV: 83.8 fL (ref 80–97)
MID (CBC): 0.4 (ref 0–0.9)
MPV: 6.6 fL (ref 0–99.8)
PLATELET COUNT, POC: 233 10*3/uL (ref 142–424)
POC Granulocyte: 3 (ref 2–6.9)
POC LYMPH PERCENT: 15.4 %L (ref 10–50)
POC MID %: 10.4 %M (ref 0–12)
RBC: 4.43 M/uL (ref 4.04–5.48)
RDW, POC: 14.6 %
WBC: 4 10*3/uL — AB (ref 4.6–10.2)

## 2016-05-28 MED ORDER — HYOSCYAMINE SULFATE 0.125 MG PO TABS: 0.1250 mg | ORAL_TABLET | Freq: Four times a day (QID) | ORAL | 0 refills | Status: DC | PRN

## 2016-05-28 NOTE — Progress Notes (Signed)
Based on what you shared with me it looks like you have a serious condition that should be evaluated in a face to face office visit.  NOTE: Even if you have entered your credit card information for this eVisit, you will not be charged.   If you are having a true medical emergency please call 911.  If you need an urgent face to face visit, [facility] has four urgent care centers for your convenience.  If you need care fast and have a high deductible or no insurance consider:   [email or web address]  [phone]  [address], [address] [place], [place] [address] 8 am to 8 pm Monday-Friday 10 am to 4 pm Saturday-Sunday   The following sites will take your  insurance:    . [facility]  [phone] Get Driving Directions Find a Provider at this Location  [address] [place], [place] [address] . 10 am to 8 pm Monday-Friday . 12 pm to 8 pm Saturday-Sunday   . [facility]  [phone] Get Driving Directions Find a Provider at this Location  [address], [address] [place], [place] [address] . 8 am to 8 pm Monday-Friday . 9 am to 6 pm Saturday . 11 am to 6 pm Sunday   . [facility] [place]  [phone] Get Driving Directions  [address] [place],  [address] . 8 am to 8 pm Monday-Friday . 8 am to 4 pm Saturday-Sunday   Your e-visit answers were reviewed by a board certified advanced clinical practitioner to complete your personal care plan.  Thank you for using e-Visits.  

## 2016-05-28 NOTE — Patient Instructions (Addendum)
Please hydrate with 64 oz of water if not more. Please return the stool sample if you are not having improvement within 48-72 hours. I would like you to lower the immodium intake.  Please review the following foods listed below. Continue your anti-nausea medication at home.   Food Choices to Help Relieve Diarrhea, Adult When you have diarrhea, the foods you eat and your eating habits are very important. Choosing the right foods and drinks can help relieve diarrhea. Also, because diarrhea can last up to 7 days, you need to replace lost fluids and electrolytes (such as sodium, potassium, and chloride) in order to help prevent dehydration. What general guidelines do I need to follow?  Slowly drink 1 cup (8 oz) of fluid for each episode of diarrhea. If you are getting enough fluid, your urine will be clear or pale yellow.  Eat starchy foods. Some good choices include white rice, white toast, pasta, low-fiber cereal, baked potatoes (without the skin), saltine crackers, and bagels.  Avoid large servings of any cooked vegetables.  Limit fruit to two servings per day. A serving is  cup or 1 small piece.  Choose foods with less than 2 g of fiber per serving.  Limit fats to less than 8 tsp (38 g) per day.  Avoid fried foods.  Eat foods that have probiotics in them. Probiotics can be found in certain dairy products.  Avoid foods and beverages that may increase the speed at which food moves through the stomach and intestines (gastrointestinal tract). Things to avoid include:  High-fiber foods, such as dried fruit, raw fruits and vegetables, nuts, seeds, and whole grain foods.  Spicy foods and high-fat foods.  Foods and beverages sweetened with high-fructose corn syrup, honey, or sugar alcohols such as xylitol, sorbitol, and mannitol. What foods are recommended? Grains  White rice. White, [place], or pita breads (fresh or toasted), including plain rolls, buns, or bagels. White pasta. Saltine,  soda, or graham crackers. Pretzels. Low-fiber cereal. Cooked cereals made with water (such as cornmeal, farina, or cream cereals). Plain muffins. Matzo. Melba toast. Zwieback. Vegetables  Potatoes (without the skin). Strained tomato and vegetable juices. Most well-cooked and canned vegetables without seeds. Tender lettuce. Fruits  Cooked or canned applesauce, apricots, cherries, fruit cocktail, grapefruit, peaches, pears, or plums. Fresh bananas, apples without skin, cherries, grapes, cantaloupe, grapefruit, peaches, oranges, or plums. Meat and Other Protein Products  Baked or boiled chicken. Eggs. Tofu. Fish. Seafood. Smooth peanut butter. Ground or well-cooked tender beef, ham, veal, lamb, pork, or poultry. Dairy  Plain yogurt, kefir, and unsweetened liquid yogurt. Lactose-free milk, buttermilk, or soy milk. Plain hard cheese. Beverages  Sport drinks. Clear broths. Diluted fruit juices (except prune). Regular, caffeine-free sodas such as ginger ale. Water. Decaffeinated teas. Oral rehydration solutions. Sugar-free beverages not sweetened with sugar alcohols. Other  Bouillon, broth, or soups made from recommended foods. The items listed above may not be a complete list of recommended foods or beverages. Contact your dietitian for more options.  What foods are not recommended? Grains  Whole grain, whole wheat, bran, or rye breads, rolls, pastas, crackers, and cereals. Wild or brown rice. Cereals that contain more than 2 g of fiber per serving. Corn tortillas or taco shells. Cooked or dry oatmeal. Granola. Popcorn. Vegetables  Raw vegetables. Cabbage, broccoli, Brussels sprouts, artichokes, baked beans, beet greens, corn, kale, legumes, peas, sweet potatoes, and yams. Potato skins. Cooked spinach and cabbage. Fruits  Dried fruit, including raisins and dates. Raw fruits. Stewed or dried  prunes. Fresh apples with skin, apricots, mangoes, pears, raspberries, and strawberries. Meat and Other Protein  Products  Chunky peanut butter. Nuts and seeds. Beans and lentils. Berniece Salines. Dairy  High-fat cheeses. Milk, chocolate milk, and beverages made with milk, such as milk shakes. Cream. Ice cream. Sweets and Desserts  Sweet rolls, doughnuts, and sweet breads. Pancakes and waffles. Fats and Oils  Butter. Cream sauces. Margarine. Salad oils. Plain salad dressings. Olives. Avocados. Beverages  Caffeinated beverages (such as coffee, tea, soda, or energy drinks). Alcoholic beverages. Fruit juices with pulp. Prune juice. Soft drinks sweetened with high-fructose corn syrup or sugar alcohols. Other  Coconut. Hot sauce. Chili powder. Mayonnaise. Gravy. Cream-based or milk-based soups. The items listed above may not be a complete list of foods and beverages to avoid. Contact your dietitian for more information.  What should I do if I become dehydrated? Diarrhea can sometimes lead to dehydration. Signs of dehydration include dark urine and dry mouth and skin. If you think you are dehydrated, you should rehydrate with an oral rehydration solution. These solutions can be purchased at pharmacies, retail stores, or online. Drink -1 cup (120-240 mL) of oral rehydration solution each time you have an episode of diarrhea. If drinking this amount makes your diarrhea worse, try drinking smaller amounts more often. For example, drink 1-3 tsp (5-15 mL) every 5-10 minutes. A general rule for staying hydrated is to drink 1-2 L of fluid per day. Talk to your health care provider about the specific amount you should be drinking each day. Drink enough fluids to keep your urine clear or pale yellow. This information is not intended to replace advice given to you by your health care provider. Make sure you discuss any questions you have with your health care provider. Document Released: 07/06/2003 Document Revised: 09/21/2015 Document Reviewed: 03/08/2013 Elsevier Interactive Patient Education  2017 [employer].    IF you  received an x-ray today, you will receive an invoice from [facility]. Please contact [facility] at [phone] with questions or concerns regarding your invoice.   IF you received labwork today, you will receive an invoice from [facility]. Please contact [facility] at [phone] with questions or concerns regarding your invoice.   Our billing staff will not be able to assist you with questions regarding bills from these companies.  You will be contacted with the lab results as soon as they are available. The fastest way to get your results is to activate your My Chart account. Instructions are located on the last page of this paperwork. If you have not heard from [place] regarding the results in 2 weeks, please contact this office.     Norovirus Infection A norovirus infection is caused by exposure to a virus in a group of similar viruses (noroviruses). This type of infection causes inflammation in your stomach and intestines (gastroenteritis). Norovirus is the most common cause of gastroenteritis. It also causes food poisoning. Anyone can get a norovirus infection. It spreads very easily (contagious). You can get it from contaminated food, water, surfaces, or other people. Norovirus is found in the stool or vomit of infected people. You can spread the infection as soon as you feel sick until 2 weeks after you recover.  Symptoms usually begin within 2 days after you become infected. Most norovirus symptoms affect the digestive system. CAUSES Norovirus infection is caused by contact with norovirus. You can catch norovirus if you:  Eat or drink something contaminated with norovirus.  Touch surfaces or objects contaminated with norovirus  and then put your hand in your mouth.  Have direct contact with an infected person who has symptoms.  Share food, drink, or utensils with someone with who is sick with norovirus. SIGNS AND SYMPTOMS Symptoms of norovirus may  include:  Nausea.  Vomiting.  Diarrhea.  Stomach cramps.  Fever.  Chills.  Headache.  Muscle aches.  Tiredness. DIAGNOSIS Your health care provider may suspect norovirus based on your symptoms and physical exam. Your health care provider may also test a sample of your stool or vomit for the virus.  TREATMENT There is no specific treatment for norovirus. Most people get better without treatment in about 2 days. HOME CARE INSTRUCTIONS  Replace lost fluids by drinking plenty of water or rehydration fluids containing important minerals called electrolytes. This prevents dehydration. Drink enough fluid to keep your urine clear or pale yellow.  Do not prepare food for others while you are infected. Wait at least 3 days after recovering from the illness to do that. PREVENTION   Wash your hands often, especially after using the toilet or changing a diaper.  Wash fruits and vegetables thoroughly before preparing or serving them.  Throw out any food that a sick person may have touched.  Disinfect contaminated surfaces immediately after someone in the household has been sick. Use a bleach-based household cleaner.  Immediately remove and wash soiled clothes or sheets. SEEK MEDICAL CARE IF:  Your vomiting, diarrhea, and stomach pain is getting worse.  Your symptoms of norovirus do not go away after 2-3 days. SEEK IMMEDIATE MEDICAL CARE IF:  You develop symptoms of dehydration that do not improve with fluid replacement. This may include:  Excessive sleepiness.  Lack of tears.  Dry mouth.  Dizziness when standing.  Weak pulse. This information is not intended to replace advice given to you by your health care provider. Make sure you discuss any questions you have with your health care provider. Document Released: 07/06/2002 Document Revised: 05/06/2014 Document Reviewed: 09/23/2013 Elsevier Interactive Patient Education  2017 [employer] Norovirus Infection A norovirus  infection is caused by exposure to a virus in a group of similar viruses (noroviruses). This type of infection causes inflammation in your stomach and intestines (gastroenteritis). Norovirus is the most common cause of gastroenteritis. It also causes food poisoning. Anyone can get a norovirus infection. It spreads very easily (contagious). You can get it from contaminated food, water, surfaces, or other people. Norovirus is found in the stool or vomit of infected people. You can spread the infection as soon as you feel sick until 2 weeks after you recover.  Symptoms usually begin within 2 days after you become infected. Most norovirus symptoms affect the digestive system. CAUSES Norovirus infection is caused by contact with norovirus. You can catch norovirus if you:  Eat or drink something contaminated with norovirus.  Touch surfaces or objects contaminated with norovirus and then put your hand in your mouth.  Have direct contact with an infected person who has symptoms.  Share food, drink, or utensils with someone with who is sick with norovirus. SIGNS AND SYMPTOMS Symptoms of norovirus may include:  Nausea.  Vomiting.  Diarrhea.  Stomach cramps.  Fever.  Chills.  Headache.  Muscle aches.  Tiredness. DIAGNOSIS Your health care provider may suspect norovirus based on your symptoms and physical exam. Your health care provider may also test a sample of your stool or vomit for the virus.  TREATMENT There is no specific treatment for norovirus. Most people get better without treatment  in about 2 days. HOME CARE INSTRUCTIONS  Replace lost fluids by drinking plenty of water or rehydration fluids containing important minerals called electrolytes. This prevents dehydration. Drink enough fluid to keep your urine clear or pale yellow.  Do not prepare food for others while you are infected. Wait at least 3 days after recovering from the illness to do that. PREVENTION   Wash your  hands often, especially after using the toilet or changing a diaper.  Wash fruits and vegetables thoroughly before preparing or serving them.  Throw out any food that a sick person may have touched.  Disinfect contaminated surfaces immediately after someone in the household has been sick. Use a bleach-based household cleaner.  Immediately remove and wash soiled clothes or sheets. SEEK MEDICAL CARE IF:  Your vomiting, diarrhea, and stomach pain is getting worse.  Your symptoms of norovirus do not go away after 2-3 days. SEEK IMMEDIATE MEDICAL CARE IF:  You develop symptoms of dehydration that do not improve with fluid replacement. This may include:  Excessive sleepiness.  Lack of tears.  Dry mouth.  Dizziness when standing.  Weak pulse. This information is not intended to replace advice given to you by your health care provider. Make sure you discuss any questions you have with your health care provider. Document Released: 07/06/2002 Document Revised: 05/06/2014 Document Reviewed: 09/23/2013 Elsevier Interactive Patient Education  2017 [employer].

## 2016-05-28 NOTE — Progress Notes (Signed)
[facility] [address], [place] [address] [phone]  Date:  05/28/2016   Name:  Katie Maldonado   DOB:  1950-09-14   MRN:  [id number]  PCP:  Leonie Douglas, PA-C    History of Present Illness:  Katie Maldonado is a 66 y.o. female patient who presents to [facility] for cc of body aches, nausea, and watery diarrhea.      Body aches, and nausea with watery diarrhea intermittently 3 days ago.  The next day, it occurred for half the day.  She ate 6 shrimp.  Yesterday, she felt worse with nausea and abdominal pain in her lower abdomen.  She took imodium, ibuprofen, abdominal pain.  Back pain.  Medical records without seeing patients.  She has dizziness.  She had 100 fever which has since resolved.   No urinary pain, dysuria, urinating a little less.  No hematuria.  There is an ammonia smell.   No vaginal discharge or bleeding.    Patient Active Problem List   Diagnosis Date Noted  . Trochanteric bursitis of left hip 03/12/2016  . Primary osteoarthritis of both feet 03/09/2016  . Primary osteoarthritis of both knees 03/09/2016  . Degenerative cervical disc 10/04/2014  . Primary osteoarthritis of both hands 08/19/2014  . Low back pain 11/26/2013  . Numbness and tingling in left arm 07/09/2013  . Anxiety state, unspecified 12/30/2012  . Other malaise and fatigue 12/30/2012  . Osteoporosis 11/13/2012  . Incontinence of urine 04/18/2011  . HYPERLIPIDEMIA-MIXED 01/08/2010  . MULTIPLE SCLEROSIS 01/08/2010    Past Medical History:  Diagnosis Date  . Allergy   . Anxiety   . Arthritis   . Diverticulitis   . Diverticulosis 10/2015  . Hypersomnolence   . Migraine   . Multiple sclerosis ([place])   . Neuromuscular disorder ([place])   . Obesity, unspecified   . Osteoporosis   . Other and unspecified hyperlipidemia   . TIA (transient ischemic attack) 22    Past Surgical History:  Procedure Laterality Date  . ABDOMINAL HYSTERECTOMY     Fibroids  . arm  surgery left    . HERNIA REPAIR    . TUBAL LIGATION    . WRIST SURGERY      Social History  Substance Use Topics  . Smoking status: Former Smoker    Years: 37.00    Types: Cigarettes    Quit date: 02/26/2004  . Smokeless tobacco: Never Used  . Alcohol use 0.0 oz/week     Comment: occ    Family History  Problem Relation Age of Onset  . Emphysema Father   . Cancer Brother     bone  . Heart disease Brother   . Cancer Maternal Grandmother   . Heart attack Brother   . Cancer Maternal Grandfather   . Cancer Paternal Grandmother   . Cancer Paternal Grandfather     Allergies  Allergen Reactions  . Nitrofurantoin Shortness Of Breath    Shortness of breath, violent chills, dizziness, nausea  . Sulfonamide Derivatives Shortness Of Breath    Shortness of breath, chills  . Cefdinir     Can't remember what rx had 2 yrs ago  . Copaxone [Glatiramer Acetate]     Medication list has been reviewed and updated.  Current Outpatient Prescriptions on File Prior to Visit  Medication Sig Dispense Refill  . alendronate (FOSAMAX) 70 MG tablet Take 1 tablet (70 mg total) by mouth once a week. Take with a full  glass of water on an empty stomach. 12 tablet 3  . amphetamine-dextroamphetamine (ADDERALL XR) 20 MG 24 hr capsule Take 1 capsule (20 mg total) by mouth daily. 30 capsule 0  . aspirin EC 81 MG tablet Take 1 tablet (81 mg total) by mouth daily.    . cholecalciferol (VITAMIN D) 1000 UNITS tablet Take 1,000 Units by mouth daily. Reported on 08/08/2015    . citalopram (CELEXA) 20 MG tablet TAKE 1 TABLET BY MOUTH ONCE DAILY 15 tablet 0  . Dimethyl Fumarate (TECFIDERA) 240 MG CPDR Take 240 mg by mouth 2 (two) times daily.    Marland Kitchen ibuprofen (ADVIL,MOTRIN) 200 MG tablet Take 2 tablets (400 mg total) by mouth 4 (four) times daily.    . mirabegron ER (MYRBETRIQ) 50 MG TB24 tablet Take 50 mg by mouth daily.    . Omega-3 Fatty Acids (FISH OIL) 1000 MG CAPS Take 1,000 mg by mouth 2 (two) times daily.    0  . omeprazole (PRILOSEC) 20 MG capsule Take 1 capsule (20 mg total) by mouth daily. 15 capsule 0  . ondansetron (ZOFRAN-ODT) 4 MG disintegrating tablet Take 1 tablet (4 mg total) by mouth every 8 (eight) hours as needed for nausea or vomiting. 60 tablet 2  . TURMERIC PO Take 1 tablet by mouth daily.     Current Facility-Administered Medications on File Prior to Visit  Medication Dose Route Frequency Provider Last Rate Last Dose  . gadopentetate dimeglumine (MAGNEVIST) injection 15 mL  15 mL Intravenous Once PRN Penni Bombard, MD      . gadopentetate dimeglumine (MAGNEVIST) injection 16 mL  16 mL Intravenous Once PRN Penni Bombard, MD        ROS ROS otherwise unremarkable unless listed above.   Physical Examination: BP 122/72 (BP Location: Right Arm, Patient Position: Sitting, Cuff Size: Normal)   Pulse 75   Temp 98.1 F (36.7 C) (Oral)   Resp 17   Ht 5' 4.5" (1.638 m)   Wt 160 lb (72.6 kg)   SpO2 97%   BMI 27.04 kg/m  Ideal Body Weight: Weight in (lb) to have BMI = 25: 147.6  Physical Exam  Constitutional: She is oriented to person, place, and time. She appears well-developed and well-nourished. No distress.  HENT:  Head: Normocephalic and atraumatic.  Right Ear: External ear normal.  Left Ear: External ear normal.  Eyes: Conjunctivae and EOM are normal. Pupils are equal, round, and reactive to light.  Cardiovascular: Normal rate.   Pulmonary/Chest: Effort normal. No respiratory distress. She has no decreased breath sounds. She has no wheezes. She has no rhonchi.  Abdominal: Soft. Normal appearance and bowel sounds are normal. There is generalized tenderness.  Neurological: She is alert and oriented to person, place, and time.  Skin: She is not diaphoretic.  Psychiatric: She has a normal mood and affect. Her behavior is normal.    Results for orders placed or performed in visit on 05/28/16  POCT Microscopic Urinalysis ([facility])  Result Value Ref Range    WBC,UR,HPF,POC Few (A) None WBC/hpf   RBC,UR,HPF,POC None None RBC/hpf   Bacteria Many (A) None, Too numerous to count   Mucus Absent Absent   Epithelial Cells, UR Per Microscopy Few (A) None, Too numerous to count cells/hpf   Crystals Few   POCT urinalysis dipstick  Result Value Ref Range   Color, UA yellow yellow   Clarity, UA clear clear   Glucose, UA negative negative   Bilirubin, UA negative negative  Ketones, POC UA negative negative   Spec Grav, UA 1.015    Blood, UA negative negative   pH, UA 6.5    Protein Ur, POC negative negative   Urobilinogen, UA 0.2    Nitrite, UA Negative Negative   Leukocytes, UA Trace (A) Negative  POCT CBC  Result Value Ref Range   WBC 4.0 (A) 4.6 - 10.2 K/uL   Lymph, poc 0.6 0.6 - 3.4   POC LYMPH PERCENT 15.4 10 - 50 %L   MID (cbc) 0.4 0 - 0.9   POC MID % 10.4 0 - 12 %M   POC Granulocyte 3.0 2 - 6.9   Granulocyte percent 74.2 37 - 80 %G   RBC 4.43 4.04 - 5.48 M/uL   Hemoglobin 12.8 12.2 - 16.2 g/dL   HCT, POC 37.1 (A) 37.7 - 47.9 %   MCV 83.8 80 - 97 fL   MCH, POC 28.9 27 - 31.2 pg   MCHC 34.5 31.8 - 35.4 g/dL   RDW, POC 14.6 %   Platelet Count, POC 233 142 - 424 K/uL   MPV 6.6 0 - 99.8 fL    Assessment and Plan: RAGINE BALDER is a 66 y.o. female who is here today for cc of diarrhea, and nausea. --possible uti, viral gastreonteritis or possible bacterial.   --advised diarrhea friendly diet.  Vitals wnl, and we can hold off on treating until we can receive culture and stool culture.  Advised levsin for cramping, and she has anti-emetic at home.  Holding off on anti-motility med, while awaiting result.  Lower abdominal pain - Plan: POCT Microscopic Urinalysis ([facility]), POCT urinalysis dipstick, POCT CBC, Urine culture  Nausea and vomiting, intractability of vomiting not specified, unspecified vomiting type - Plan: POCT Microscopic Urinalysis ([facility]), POCT urinalysis dipstick, POCT CBC, Urine culture, GI Profile, Stool, PCR, GI  Profile, Stool, PCR  Dysuria - Plan: GI Profile, Stool, PCR, GI Profile, Stool, PCR  Leukocytes in urine - Plan: Urine culture  Ivar Drape, PA-C [facility] 2/2/20188:27 AM  ADDENDUM: Contacted patient 05/30/2016 evening with result of norovirus on voicemail of cell phone, and in [place].  Instruction on hydration.  Advised loperamide for no more than 2 days, and alarming sx to warrant immediate return.

## 2016-05-29 DIAGNOSIS — R300 Dysuria: Secondary | ICD-10-CM | POA: Diagnosis not present

## 2016-05-29 DIAGNOSIS — R112 Nausea with vomiting, unspecified: Secondary | ICD-10-CM | POA: Diagnosis not present

## 2016-05-29 MED FILL — HYOSCYAMINE 0.125 MG TAB SL: 0.125 | 5 days supply | Qty: 20 | Fill #0

## 2016-05-30 LAB — GI PROFILE, STOOL, PCR
ADENOVIRUS F 40/41: NOT DETECTED
ASTROVIRUS: NOT DETECTED
C difficile toxin A/B: NOT DETECTED
CYCLOSPORA CAYETANENSIS: NOT DETECTED
Campylobacter: NOT DETECTED
Cryptosporidium: NOT DETECTED
ENTEROAGGREGATIVE E COLI: NOT DETECTED
ENTEROPATHOGENIC E COLI: NOT DETECTED
ENTEROTOXIGENIC E COLI: NOT DETECTED
Entamoeba histolytica: NOT DETECTED
GIARDIA LAMBLIA: NOT DETECTED
NOROVIRUS GI/GII: DETECTED — AB
Plesiomonas shigelloides: NOT DETECTED
ROTAVIRUS A: NOT DETECTED
SALMONELLA: NOT DETECTED
SHIGA-TOXIN-PRODUCING E COLI: NOT DETECTED
Sapovirus: NOT DETECTED
Shigella/Enteroinvasive E coli: NOT DETECTED
Vibrio cholerae: NOT DETECTED
Vibrio: NOT DETECTED
YERSINIA ENTEROCOLITICA: NOT DETECTED

## 2016-05-31 LAB — URINE CULTURE

## 2016-06-05 ENCOUNTER — Ambulatory Visit (INDEPENDENT_AMBULATORY_CARE_PROVIDER_SITE_OTHER): Payer: 59 | Admitting: Physician Assistant

## 2016-06-05 ENCOUNTER — Encounter: Payer: Self-pay | Admitting: Physician Assistant

## 2016-06-05 VITALS — BP 101/68 | HR 82 | Temp 98.0°F | Ht 64.5 in | Wt 157.0 lb

## 2016-06-05 DIAGNOSIS — Z79899 Other long term (current) drug therapy: Secondary | ICD-10-CM | POA: Diagnosis not present

## 2016-06-05 DIAGNOSIS — R5383 Other fatigue: Secondary | ICD-10-CM | POA: Diagnosis not present

## 2016-06-05 DIAGNOSIS — Z1389 Encounter for screening for other disorder: Secondary | ICD-10-CM

## 2016-06-05 DIAGNOSIS — Z13220 Encounter for screening for lipoid disorders: Secondary | ICD-10-CM

## 2016-06-05 DIAGNOSIS — Z13228 Encounter for screening for other metabolic disorders: Secondary | ICD-10-CM

## 2016-06-05 DIAGNOSIS — K219 Gastro-esophageal reflux disease without esophagitis: Secondary | ICD-10-CM

## 2016-06-05 DIAGNOSIS — Z114 Encounter for screening for human immunodeficiency virus [HIV]: Secondary | ICD-10-CM | POA: Diagnosis not present

## 2016-06-05 DIAGNOSIS — Z0000 Encounter for general adult medical examination without abnormal findings: Secondary | ICD-10-CM

## 2016-06-05 DIAGNOSIS — Z130 Encounter for screening for diseases of the blood and blood-forming organs and certain disorders involving the immune mechanism: Secondary | ICD-10-CM | POA: Diagnosis not present

## 2016-06-05 DIAGNOSIS — Z1321 Encounter for screening for nutritional disorder: Secondary | ICD-10-CM | POA: Diagnosis not present

## 2016-06-05 DIAGNOSIS — F419 Anxiety disorder, unspecified: Secondary | ICD-10-CM

## 2016-06-05 LAB — POCT URINALYSIS DIP (MANUAL ENTRY)
Bilirubin, UA: NEGATIVE
Glucose, UA: NEGATIVE
Leukocytes, UA: NEGATIVE
Nitrite, UA: NEGATIVE
PH UA: 5.5
PROTEIN UA: NEGATIVE
RBC UA: NEGATIVE
SPEC GRAV UA: 1.015
UROBILINOGEN UA: 0.2

## 2016-06-05 MED ORDER — CITALOPRAM HYDROBROMIDE 20 MG PO TABS: 20.0000 mg | ORAL_TABLET | Freq: Every day | ORAL | 1 refills | Status: DC

## 2016-06-05 MED ORDER — OMEPRAZOLE 20 MG PO CPDR: 20.0000 mg | DELAYED_RELEASE_CAPSULE | Freq: Every day | ORAL | 1 refills | Status: DC

## 2016-06-05 MED FILL — CITALOPRAM HBR 20 MG TABLET: 20 | 90 days supply | Qty: 90 | Fill #0

## 2016-06-05 MED FILL — OMEPRAZOLE 20 MG CAPSULE DR: 20 | 90 days supply | Qty: 90 | Fill #0

## 2016-06-05 NOTE — Progress Notes (Signed)
Katie Maldonado  MRN: [id number] DOB: [birth year]  Subjective:  Pt is a 66 y.o. female who presents for annual physical exam. Of note, pt has MS and is followed closely by [facility].   Diet: She loves sugars and pastas. She eats vegetables, fruits, chicken, and fish. Does not like red meat. She drinks water and diet sprite.  Exercise: Walks at least 2-3 times a week.  Bowel movements: Typically has BM every other day.   Social: Has a good social life. Has good set of friends. Married with 2 children. Pt is still sexually active in monogamous relationship.   Pt also needs refills for celexa 34m for anxiety and for prilosec 222mdaily for GERD. She has dealt with anxiety for 4 years, has been well controlled on celexa for the past 2 years. Has been on prilosec 2076mor GERD for the past year, can tell when she is off of it because she will have heartburn. Pt has no other questions or concerns.   Last annual exam: 2016 Last dental exam: 2013 Last vision exam: 2015 Last pap smear: Hysterectomy 1990 Last mammogram: 04/24/2016 Last colonoscopy: 11/28/2006 Vaccinations      Tetanus: 08/28/2015      Zostavax: 11/17/2012  Patient Active Problem List   Diagnosis Date Noted  . Trochanteric bursitis of left hip 03/12/2016  . Primary osteoarthritis of both feet 03/09/2016  . Primary osteoarthritis of both knees 03/09/2016  . Degenerative cervical disc 10/04/2014  . Primary osteoarthritis of both hands 08/19/2014  . Low back pain 11/26/2013  . Numbness and tingling in left arm 07/09/2013  . Anxiety state, unspecified 12/30/2012  . Other malaise and fatigue 12/30/2012  . Osteoporosis 11/13/2012  . Incontinence of urine 04/18/2011  . HYPERLIPIDEMIA-MIXED 01/08/2010  . MULTIPLE SCLEROSIS 01/08/2010    Current Outpatient Prescriptions on File Prior to Visit  Medication Sig Dispense Refill  . alendronate (FOSAMAX) 70 MG tablet Take 1 tablet (70 mg total) by mouth once a week. Take with  a full glass of water on an empty stomach. 12 tablet 3  . amphetamine-dextroamphetamine (ADDERALL XR) 20 MG 24 hr capsule Take 1 capsule (20 mg total) by mouth daily. 30 capsule 0  . aspirin EC 81 MG tablet Take 1 tablet (81 mg total) by mouth daily.    . cholecalciferol (VITAMIN D) 1000 UNITS tablet Take 1,000 Units by mouth daily. Reported on 08/08/2015    . citalopram (CELEXA) 20 MG tablet TAKE 1 TABLET BY MOUTH ONCE DAILY 15 tablet 0  . Dimethyl Fumarate (TECFIDERA) 240 MG CPDR Take 240 mg by mouth 2 (two) times daily.    . hyoscyamine (LEVSIN) 0.125 MG tablet Take 1 tablet (0.125 mg total) by mouth every 6 (six) hours as needed. For abdominal cramping 20 tablet 0  . ibuprofen (ADVIL,MOTRIN) 200 MG tablet Take 2 tablets (400 mg total) by mouth 4 (four) times daily.    . mirabegron ER (MYRBETRIQ) 50 MG TB24 tablet Take 50 mg by mouth daily.    . Omega-3 Fatty Acids (FISH OIL) 1000 MG CAPS Take 1,000 mg by mouth 2 (two) times daily.   0  . omeprazole (PRILOSEC) 20 MG capsule Take 1 capsule (20 mg total) by mouth daily. 15 capsule 0  . ondansetron (ZOFRAN-ODT) 4 MG disintegrating tablet Take 1 tablet (4 mg total) by mouth every 8 (eight) hours as needed for nausea or vomiting. 60 tablet 2  . TURMERIC PO Take 1 tablet by mouth daily.  Current Facility-Administered Medications on File Prior to Visit  Medication Dose Route Frequency Provider Last Rate Last Dose  . gadopentetate dimeglumine (MAGNEVIST) injection 15 mL  15 mL Intravenous Once PRN Penni Bombard, MD      . gadopentetate dimeglumine (MAGNEVIST) injection 16 mL  16 mL Intravenous Once PRN Penni Bombard, MD        Allergies  Allergen Reactions  . Nitrofurantoin Shortness Of Breath    Shortness of breath, violent chills, dizziness, nausea  . Sulfonamide Derivatives Shortness Of Breath    Shortness of breath, chills  . Cefdinir     Can't remember what rx had 2 yrs ago  . Copaxone [Glatiramer Acetate]     Social  History   Social History  . Marital status: Married    Spouse name: Katie Maldonado"  . Number of children: 2  . Years of education: college   Occupational History  . PATIENT ACCOUNTING [employer]  . [employer]   Social History Main Topics  . Smoking status: Former Smoker    Years: 37.00    Types: Cigarettes    Quit date: 02/26/2004  . Smokeless tobacco: Never Used  . Alcohol use 0.0 oz/week     Comment: occ  . Drug use: No  . Sexual activity: Yes    Birth control/ protection: None   Other Topics Concern  . None   Social History Narrative   Patient is married [name]), has 2 children   Patient is right handed   Education level is 2 yrs of college   Caffeine consumption is 3-4 cups daily    Past Surgical History:  Procedure Laterality Date  . ABDOMINAL HYSTERECTOMY     Fibroids  . arm surgery left    . TUBAL LIGATION    . WRIST SURGERY      Family History  Problem Relation Age of Onset  . Emphysema Father   . Cancer Brother     bone  . Heart disease Brother   . Cancer Maternal Grandmother   . Heart attack Brother   . Cancer Maternal Grandfather   . Cancer Paternal Grandmother   . Cancer Paternal Grandfather     Review of Systems  Constitutional: Positive for diaphoresis and fatigue. Negative for activity change, appetite change, chills, fever and unexpected weight change.  HENT: Positive for dental problem, nosebleeds and postnasal drip. Negative for congestion, drooling, ear discharge, ear pain, facial swelling, hearing loss, mouth sores, rhinorrhea, sinus pain, sinus pressure, sneezing, sore throat, tinnitus, trouble swallowing and voice change.   Eyes: Positive for visual disturbance. Negative for photophobia, pain, discharge, redness and itching.  Respiratory: Negative for apnea, cough, choking, chest tightness, shortness of breath, wheezing and stridor.   Cardiovascular: Negative for chest pain, palpitations and leg swelling.    Gastrointestinal: Positive for abdominal distention, constipation, diarrhea and nausea. Negative for abdominal pain, anal bleeding, blood in stool, rectal pain and vomiting.  Endocrine: Positive for heat intolerance. Negative for cold intolerance, polydipsia, polyphagia and polyuria.  Genitourinary: Positive for frequency. Negative for decreased urine volume, difficulty urinating, dyspareunia, dysuria, enuresis, flank pain, genital sores, hematuria, menstrual problem, pelvic pain, urgency, vaginal bleeding, vaginal discharge and vaginal pain.  Musculoskeletal: Positive for arthralgias, back pain, joint swelling and neck stiffness. Negative for gait problem, myalgias and neck pain.  Skin: Negative for color change, pallor, rash and wound.  Allergic/Immunologic: Negative for environmental allergies, food allergies and immunocompromised state.  Neurological: Negative for dizziness, tremors, seizures,  syncope, facial asymmetry, speech difficulty, weakness, light-headedness, numbness and headaches.  Hematological: Negative for adenopathy. Does not bruise/bleed easily.  Psychiatric/Behavioral: Positive for confusion. Negative for agitation, behavioral problems, decreased concentration, dysphoric mood, hallucinations, self-injury, sleep disturbance and suicidal ideas. The patient is nervous/anxious. The patient is not hyperactive.     Objective:  BP 101/68   Pulse 82   Temp 98 F (36.7 C) (Oral)   Ht 5' 4.5" (1.638 m)   Wt 157 lb (71.2 kg)   SpO2 100%   BMI 26.53 kg/m   Physical Exam  Constitutional: She is oriented to person, place, and time and well-developed, well-nourished, and in no distress.  HENT:  Head: Normocephalic and atraumatic.  Right Ear: Hearing, tympanic membrane, external ear and ear canal normal.  Left Ear: Hearing, tympanic membrane, external ear and ear canal normal.  Nose: Nose normal.  Mouth/Throat: Uvula is midline, oropharynx is clear and moist and mucous membranes are  normal. Dental caries present. No oropharyngeal exudate.  Eyes: Conjunctivae, EOM and lids are normal. Pupils are equal, round, and reactive to light. No scleral icterus.  Neck: Trachea normal and normal range of motion. No thyroid mass and no thyromegaly present.  Cardiovascular: Normal rate, regular rhythm, normal heart sounds and intact distal pulses.   Pulmonary/Chest: Effort normal and breath sounds normal. Right breast exhibits no inverted nipple, no mass, no nipple discharge, no skin change and no tenderness. Left breast exhibits no inverted nipple, no mass, no nipple discharge, no skin change and no tenderness. Breasts are symmetrical.  Abdominal: Soft. Normal appearance and bowel sounds are normal. There is no tenderness.  Genitourinary: Vagina normal, right adnexa normal, left adnexa normal and vulva normal.  Lymphadenopathy:       Head (right side): No tonsillar, no preauricular, no posterior auricular and no occipital adenopathy present.       Head (left side): No tonsillar, no preauricular, no posterior auricular and no occipital adenopathy present.    She has no cervical adenopathy.       Right: No supraclavicular adenopathy present.       Left: No supraclavicular adenopathy present.  Neurological: She is alert and oriented to person, place, and time. She has normal sensation, normal strength and normal reflexes. Gait normal.  Skin: Skin is warm and dry.  Psychiatric: Affect normal.  Vitals reviewed.   Visual Acuity Screening   Right eye Left eye Both eyes  Without correction:     With correction: '20/25 20/15 20/15 '   Results for orders placed or performed in visit on 06/05/16 (from the past 24 hour(s))  POCT urinalysis dipstick     Status: Abnormal   Collection Time: 06/05/16  5:08 PM  Result Value Ref Range   Color, UA yellow yellow   Clarity, UA clear clear   Glucose, UA negative negative   Bilirubin, UA negative negative   Ketones, POC UA large (80) (A) negative    Spec Grav, UA 1.015    Blood, UA negative negative   pH, UA 5.5    Protein Ur, POC negative negative   Urobilinogen, UA 0.2    Nitrite, UA Negative Negative   Leukocytes, UA Negative Negative    Assessment and Plan :  Discussed healthy lifestyle, diet, exercise, preventative care, vaccinations, and addressed patient's concerns. Plan for follow up in 6 months. Otherwise, plan for specific conditions below. 1. Annual physical exam Await lab results.  2. Screening, anemia, deficiency, iron - CBC with Differential/Platelet  3. Screening for metabolic  disorder - CMP14+EGFR  4. Screening, lipid - Lipid panel  5. Screening for HIV (human immunodeficiency virus) - HIV antibody  6. Fatigue, unspecified type - TSH  7. Encounter for vitamin deficiency screening - VITAMIN D 25 Hydroxy (Vit-D Deficiency, Fractures)  8. Current use of proton pump inhibitor - Magnesium  9. Screening for hematuria or proteinuria - POCT urinalysis dipstick - Urinalysis, microscopic only  10. Anxiety -Follow up in 6 months for reevaluation.  - citalopram (CELEXA) 20 MG tablet; Take 1 tablet (20 mg total) by mouth daily.  Dispense: 90 tablet; Refill: 1  11. Gastroesophageal reflux disease, esophagitis presence not specified - omeprazole (PRILOSEC) 20 MG capsule; Take 1 capsule (20 mg total) by mouth daily.  Dispense: 90 capsule; Refill: [place] PA-C  [facility] 06/05/2016 3:02 PM

## 2016-06-05 NOTE — Patient Instructions (Addendum)
Follow up in 6 months for reevaluation of anxiety. We can refill your medication at that time. In the meantime, continue engaging in exercise and eating a well balanced meal. You will be contacted once your lab results are completed. Thank you for letting me participate in your health and well being.   IF you received an x-ray today, you will receive an invoice from [facility]. Please contact [facility] at [phone] with questions or concerns regarding your invoice.   IF you received labwork today, you will receive an invoice from [facility]. Please contact [facility] at [phone] with questions or concerns regarding your invoice.   Our billing staff will not be able to assist you with questions regarding bills from these companies.  You will be contacted with the lab results as soon as they are available. The fastest way to get your results is to activate your My Chart account. Instructions are located on the last page of this paperwork. If you have not heard from [place] regarding the results in 2 weeks, please contact this office.

## 2016-06-06 LAB — LIPID PANEL
CHOL/HDL RATIO: 3.7 ratio (ref 0.0–4.4)
Cholesterol, Total: 192 mg/dL (ref 100–199)
HDL: 52 mg/dL (ref >39)
LDL CALC: 114 mg/dL — AB (ref 0–99)
TRIGLYCERIDES: 132 mg/dL (ref 0–149)
VLDL CHOLESTEROL CAL: 26 mg/dL (ref 5–40)

## 2016-06-06 LAB — CBC WITH DIFFERENTIAL/PLATELET
BASOS: 1 %
Basophils Absolute: 0 10*3/uL (ref 0.0–0.2)
EOS (ABSOLUTE): 0.1 10*3/uL (ref 0.0–0.4)
EOS: 1 %
HEMATOCRIT: 37.1 % (ref 34.0–46.6)
Hemoglobin: 12.4 g/dL (ref 11.1–15.9)
IMMATURE GRANS (ABS): 0 10*3/uL (ref 0.0–0.1)
IMMATURE GRANULOCYTES: 1 %
LYMPHS: 14 %
Lymphocytes Absolute: 0.7 10*3/uL (ref 0.7–3.1)
MCH: 28.1 pg (ref 26.6–33.0)
MCHC: 33.4 g/dL (ref 31.5–35.7)
MCV: 84 fL (ref 79–97)
Monocytes Absolute: 0.5 10*3/uL (ref 0.1–0.9)
Monocytes: 9 %
NEUTROS PCT: 74 %
Neutrophils Absolute: 3.7 10*3/uL (ref 1.4–7.0)
PLATELETS: 257 10*3/uL (ref 150–379)
RBC: 4.41 x10E6/uL (ref 3.77–5.28)
RDW: 14.5 % (ref 12.3–15.4)
WBC: 5 10*3/uL (ref 3.4–10.8)

## 2016-06-06 LAB — CMP14+EGFR
A/G RATIO: 1.6 (ref 1.2–2.2)
ALT: 21 IU/L (ref 0–32)
AST: 23 IU/L (ref 0–40)
Albumin: 4.3 g/dL (ref 3.6–4.8)
Alkaline Phosphatase: 78 IU/L (ref 39–117)
BUN/Creatinine Ratio: 12 (ref 12–28)
BUN: 9 mg/dL (ref 8–27)
Bilirubin Total: 0.6 mg/dL (ref 0.0–1.2)
CALCIUM: 9.5 mg/dL (ref 8.7–10.3)
CO2: 23 mmol/L (ref 18–29)
CREATININE: 0.73 mg/dL (ref 0.57–1.00)
Chloride: 100 mmol/L (ref 96–106)
GFR, EST AFRICAN AMERICAN: 101 mL/min/{1.73_m2} (ref >59)
GFR, EST NON AFRICAN AMERICAN: 88 mL/min/{1.73_m2} (ref >59)
Globulin, Total: 2.7 g/dL (ref 1.5–4.5)
Glucose: 80 mg/dL (ref 65–99)
POTASSIUM: 5.1 mmol/L (ref 3.5–5.2)
Sodium: 141 mmol/L (ref 134–144)
TOTAL PROTEIN: 7 g/dL (ref 6.0–8.5)

## 2016-06-06 LAB — TSH: TSH: 2.29 u[IU]/mL (ref 0.450–4.500)

## 2016-06-06 LAB — URINALYSIS, MICROSCOPIC ONLY
Bacteria, UA: NONE SEEN
CASTS: NONE SEEN /LPF

## 2016-06-06 LAB — HIV ANTIBODY (ROUTINE TESTING W REFLEX): HIV SCREEN 4TH GENERATION: NONREACTIVE

## 2016-06-06 LAB — MAGNESIUM: MAGNESIUM: 2.1 mg/dL (ref 1.6–2.3)

## 2016-06-06 LAB — VITAMIN D 25 HYDROXY (VIT D DEFICIENCY, FRACTURES): Vit D, 25-Hydroxy: 42.5 ng/mL (ref 30.0–100.0)

## 2016-06-10 ENCOUNTER — Other Ambulatory Visit: Payer: Self-pay | Admitting: Diagnostic Neuroimaging

## 2016-06-10 ENCOUNTER — Ambulatory Visit
Admission: RE | Admit: 2016-06-10 | Discharge: 2016-06-10 | Disposition: A | Discharge status: Home / Self Care | Payer: 59 | Source: Ambulatory Visit | Attending: Physician Assistant | Admitting: Physician Assistant

## 2016-06-10 ENCOUNTER — Encounter: Payer: Self-pay | Admitting: Diagnostic Neuroimaging

## 2016-06-10 DIAGNOSIS — Z1231 Encounter for screening mammogram for malignant neoplasm of breast: Secondary | ICD-10-CM | POA: Diagnosis not present

## 2016-06-10 MED ORDER — AMPHETAMINE-DEXTROAMPHET ER 20 MG PO CP24: 20.0000 mg | ORAL_CAPSULE | Freq: Every day | ORAL | 0 refills | Status: DC

## 2016-06-11 ENCOUNTER — Encounter: Payer: Self-pay | Admitting: *Deleted

## 2016-06-11 NOTE — Progress Notes (Signed)
Office Visit Note  Patient: Katie Maldonado             Date of Birth: [birth year]           MRN: [id number]             PCP: Leonie Douglas, PA-C Referring: Elby Beck, FNP Visit Date: 06/20/2016 Occupation: '@GUAROCC'$ @    Subjective:  Follow up on osteoporosis   History of Present Illness: Katie Maldonado is a 66 y.o. female with history of osteoarthritis involving her hands, feet and knee joints. She also has history of disc disease of C-spine and lumbar spine and osteoporosis. Left trochanteric bursitis has been better after the cortisone injection and physical therapy. Joint pain is tolerable currently.  Activities of Daily Living:  Patient reports morning stiffness for5  minutes.   Patient Denies nocturnal pain.  Difficulty dressing/grooming: Denies Difficulty climbing stairs: Denies Difficulty getting out of chair: Denies Difficulty using hands for taps, buttons, cutlery, and/or writing: Denies   Review of Systems  Constitutional: Positive for fatigue. Negative for night sweats, weight gain, weight loss and weakness.  HENT: Negative for mouth sores, trouble swallowing, trouble swallowing, mouth dryness and nose dryness.   Eyes: Negative for pain, redness, visual disturbance and dryness.  Respiratory: Negative for cough, shortness of breath and difficulty breathing.   Cardiovascular: Negative for chest pain, palpitations, hypertension, irregular heartbeat and swelling in legs/feet.  Gastrointestinal: Negative for blood in stool, constipation and diarrhea.  Endocrine: Negative for increased urination.  Genitourinary: Negative for vaginal dryness.  Musculoskeletal: Positive for arthralgias, joint pain, myalgias and myalgias. Negative for joint swelling, muscle weakness, morning stiffness and muscle tenderness.  Skin: Negative for color change, rash, hair loss, skin tightness, ulcers and sensitivity to sunlight.  Allergic/Immunologic: Negative for  susceptible to infections.  Neurological: Negative for dizziness, memory loss and night sweats.  Hematological: Negative for swollen glands.  Psychiatric/Behavioral: Negative for depressed mood and sleep disturbance. The patient is not nervous/anxious.     PMFS History:  Patient Active Problem List   Diagnosis Date Noted  . History of multiple sclerosis 06/20/2016  . History of anxiety 06/20/2016  . Trochanteric bursitis of left hip 03/12/2016  . Primary osteoarthritis of both feet 03/09/2016  . Primary osteoarthritis of both knees 03/09/2016  . Degenerative cervical disc 10/04/2014  . Primary osteoarthritis of both hands 08/19/2014  . Low back pain 11/26/2013  . Numbness and tingling in left arm 07/09/2013  . Anxiety state, unspecified 12/30/2012  . Other fatigue 12/30/2012  . Osteoporosis 11/13/2012  . Incontinence of urine 04/18/2011  . HYPERLIPIDEMIA-MIXED 01/08/2010  . MULTIPLE SCLEROSIS 01/08/2010    Past Medical History:  Diagnosis Date  . Allergy   . Anxiety   . Arthritis   . Diverticulitis   . Diverticulosis 10/2015  . Hypersomnolence   . Migraine   . Multiple sclerosis ([place])   . Neuromuscular disorder ([place])   . Obesity, unspecified   . Osteoporosis   . Other and unspecified hyperlipidemia   . TIA (transient ischemic attack) 22    Family History  Problem Relation Age of Onset  . Emphysema Father   . Cancer Brother     bone  . Heart disease Brother   . Cancer Maternal Grandmother   . Heart attack Brother   . Cancer Maternal Grandfather   . Cancer Paternal Grandmother   . Cancer Paternal Grandfather    Past Surgical History:  Procedure Laterality Date  . ABDOMINAL HYSTERECTOMY  Fibroids  . arm surgery left    . TUBAL LIGATION    . WRIST SURGERY     Social History   Social History Narrative   Patient is married [name]), has 2 children   Patient is right handed   Education level is 2 yrs of college   Caffeine consumption is 3-4 cups daily       Exercises at least 2-3 times a week. Walks about 5 miles a week.      Objective: Vital Signs: BP 118/68   Pulse 66   Resp 16   Wt 160 lb (72.6 kg)   BMI 27.04 kg/m    Physical Exam  Constitutional: She is oriented to person, place, and time. She appears well-developed and well-nourished.  HENT:  Head: Normocephalic and atraumatic.  Eyes: Conjunctivae and EOM are normal.  Neck: Normal range of motion.  Cardiovascular: Normal rate, regular rhythm, normal heart sounds and intact distal pulses.   Pulmonary/Chest: Effort normal and breath sounds normal.  Abdominal: Soft. Bowel sounds are normal.  Lymphadenopathy:    She has no cervical adenopathy.  Neurological: She is alert and oriented to person, place, and time.  Skin: Skin is warm and dry. Capillary refill takes less than 2 seconds.  Psychiatric: She has a normal mood and affect. Her behavior is normal.  Nursing note and vitals reviewed.    Musculoskeletal Exam: C-spine, thoracic, lumbar spine good range of motion she does have some scoliosis. Shoulder joints elbow joints wrist joints are good range of motion. She has thickening of bilateral CMC joints and bilateral DIP joints consistent with osteoarthritis there was no synovitis. Hip joints, knee joints, ankles, MTPs PIPs with good range of motion she does have some osteoarthritic changes in her MTPs and PIP joints. No synovitis was noted.  CDAI Exam: No CDAI exam completed.    Investigation: Findings:  12/16 DXA  by PCP The BMD measured at Femur Neck Left is 0.526 g/cm2 with a T-score of -3.7. This patient is considered osteoporotic according to [facility]) criteria. There has been a statistically significant decrease in BMD of the Left hip since prior exam dated 12/10/2012.  Site Region Measured Date Measured Age YA BMD Significant CHANGE T-score DualFemur Neck Left 6/05/2015    62.6         -3.9    0.526 g/cm2 *  AP Spine  L1-L4      [birth year]05/2015    62.6         -2.9    0.834 g/cm2   Office Visit on 06/05/2016  Component Date Value Ref Range Status  . WBC 06/05/2016 5.0  3.4 - 10.8 x10E3/uL Final  . RBC 06/05/2016 4.41  3.77 - 5.28 x10E6/uL Final  . Hemoglobin 06/05/2016 12.4  11.1 - 15.9 g/dL Final  . Hematocrit 06/05/2016 37.1  34.0 - 46.6 % Final  . MCV 06/05/2016 84  79 - 97 fL Final  . MCH 06/05/2016 28.1  26.6 - 33.0 pg Final  . MCHC 06/05/2016 33.4  31.5 - 35.7 g/dL Final  . RDW 06/05/2016 14.5  12.3 - 15.4 % Final  . Platelets 06/05/2016 257  150 - 379 x10E3/uL Final  . Neutrophils 06/05/2016 74  Not Estab. % Final  . Lymphs 06/05/2016 14  Not Estab. % Final  . Monocytes 06/05/2016 9  Not Estab. % Final  . Eos 06/05/2016 1  Not Estab. % Final  . Basos 06/05/2016 1  Not Estab. % Final  .  Neutrophils Absolute 06/05/2016 3.7  1.4 - 7.0 x10E3/uL Final  . Lymphocytes Absolute 06/05/2016 0.7  0.7 - 3.1 x10E3/uL Final  . Monocytes Absolute 06/05/2016 0.5  0.1 - 0.9 x10E3/uL Final  . EOS (ABSOLUTE) 06/05/2016 0.1  0.0 - 0.4 x10E3/uL Final  . Basophils Absolute 06/05/2016 0.0  0.0 - 0.2 x10E3/uL Final  . Immature Granulocytes 06/05/2016 1  Not Estab. % Final  . Immature Grans (Abs) 06/05/2016 0.0  0.0 - 0.1 x10E3/uL Final  . Glucose 06/05/2016 80  65 - 99 mg/dL Final  . BUN 06/05/2016 9  8 - 27 mg/dL Final  . Creatinine, Ser 06/05/2016 0.73  0.57 - 1.00 mg/dL Final  . GFR calc non Af Amer 06/05/2016 88  >59 mL/min/1.73 Final  . GFR calc Af Amer 06/05/2016 101  >59 mL/min/1.73 Final  . BUN/Creatinine Ratio 06/05/2016 12  12 - 28 Final  . Sodium 06/05/2016 141  134 - 144 mmol/L Final  . Potassium 06/05/2016 5.1  3.5 - 5.2 mmol/L Final  . Chloride 06/05/2016 100  96 - 106 mmol/L Final  . CO2 06/05/2016 23  18 - 29 mmol/L Final  . Calcium 06/05/2016 9.5  8.7 - 10.3 mg/dL Final  . Total Protein 06/05/2016 7.0  6.0 - 8.5 g/dL Final  . Albumin 06/05/2016 4.3  3.6 - 4.8 g/dL Final  . Globulin, Total 06/05/2016 2.7   1.5 - 4.5 g/dL Final  . Albumin/Globulin Ratio 06/05/2016 1.6  1.2 - 2.2 Final  . Bilirubin Total 06/05/2016 0.6  0.0 - 1.2 mg/dL Final  . Alkaline Phosphatase 06/05/2016 78  39 - 117 IU/L Final  . AST 06/05/2016 23  0 - 40 IU/L Final  . ALT 06/05/2016 21  0 - 32 IU/L Final  . Cholesterol, Total 06/05/2016 192  100 - 199 mg/dL Final  . Triglycerides 06/05/2016 132  0 - 149 mg/dL Final  . HDL 06/05/2016 52  >39 mg/dL Final  . VLDL Cholesterol Cal 06/05/2016 26  5 - 40 mg/dL Final  . LDL Calculated 06/05/2016 114* 0 - 99 mg/dL Final  . Chol/HDL Ratio 06/05/2016 3.7  0.0 - 4.4 ratio units Final   Comment:                                   T. Chol/HDL Ratio                                             Men  Women                               1/2 Avg.Risk  3.4    3.3                                   Avg.Risk  5.0    4.4                                2X Avg.Risk  9.6    7.1  3X Avg.Risk 23.4   11.0   . HIV Screen 4th Generation wRfx 06/05/2016 Non Reactive  Non Reactive Final  . TSH 06/05/2016 2.290  0.450 - 4.500 uIU/mL Final  . Vit D, 25-Hydroxy 06/05/2016 42.5  30.0 - 100.0 ng/mL Final   Comment: Vitamin D deficiency has been defined by the [place] practice guideline as a level of serum 25-OH vitamin D less than 20 ng/mL (1,2). The Endocrine Society went on to further define vitamin D insufficiency as a level between 21 and 29 ng/mL (2). 1. IOM (Institute of Medicine). 2010. Dietary reference    intakes for calcium and D. [place]: The    [employer]. 2. Holick MF, Binkley [place], Bischoff-Ferrari HA, et al.    Evaluation, treatment, and prevention of vitamin D    deficiency: an Endocrine Society clinical practice    guideline. JCEM. 2011 Jul; 96(7):1911-30.   . Magnesium 06/05/2016 2.1  1.6 - 2.3 mg/dL Final  . Color, UA 06/05/2016 yellow  yellow Final  . Clarity, UA 06/05/2016 clear  clear Final    . Glucose, UA 06/05/2016 negative  negative Final  . Bilirubin, UA 06/05/2016 negative  negative Final  . Ketones, POC UA 06/05/2016 large (80)* negative Final  . Spec Grav, UA 06/05/2016 1.015   Final  . Blood, UA 06/05/2016 negative  negative Final  . pH, UA 06/05/2016 5.5   Final  . Protein Ur, POC 06/05/2016 negative  negative Final  . Urobilinogen, UA 06/05/2016 0.2   Final  . Nitrite, UA 06/05/2016 Negative  Negative Final  . Leukocytes, UA 06/05/2016 Negative  Negative Final  . WBC, UA 06/05/2016 0-5  0 - 5 /hpf Final  . RBC, UA 06/05/2016 0-2  0 - 2 /hpf Final  . Epithelial Cells (non renal) 06/05/2016 0-10  0 - 10 /hpf Final  . Casts 06/05/2016 None seen  None seen /lpf Final  . Mucus, UA 06/05/2016 Present  Not Estab. Final  . Bacteria, UA 06/05/2016 None seen  None seen/Few Final  Office Visit on 05/28/2016  Component Date Value Ref Range Status  . WBC,UR,HPF,POC 05/28/2016 Few* None WBC/hpf Final  . RBC,UR,HPF,POC 05/28/2016 None  None RBC/hpf Final  . Bacteria 05/28/2016 Many* None, Too numerous to count Final  . Mucus 05/28/2016 Absent  Absent Final  . Epithelial Cells, UR Per Microscopy 05/28/2016 Few* None, Too numerous to count cells/hpf Final  . Crystals 05/28/2016 Few   Final  . Color, UA 05/28/2016 yellow  yellow Final  . Clarity, UA 05/28/2016 clear  clear Final  . Glucose, UA 05/28/2016 negative  negative Final  . Bilirubin, UA 05/28/2016 negative  negative Final  . Ketones, POC UA 05/28/2016 negative  negative Final  . Spec Grav, UA 05/28/2016 1.015   Final  . Blood, UA 05/28/2016 negative  negative Final  . pH, UA 05/28/2016 6.5   Final  . Protein Ur, POC 05/28/2016 negative  negative Final  . Urobilinogen, UA 05/28/2016 0.2   Final  . Nitrite, UA 05/28/2016 Negative  Negative Final  . Leukocytes, UA 05/28/2016 Trace* Negative Final  . WBC 05/28/2016 4.0* 4.6 - 10.2 K/uL Final  . Lymph, poc 05/28/2016 0.6  0.6 - 3.4 Final  . POC LYMPH PERCENT 05/28/2016  15.4  10 - 50 %L Final  . MID (cbc) 05/28/2016 0.4  0 - 0.9 Final  . POC MID % 05/28/2016 10.4  0 - 12 %M Final  . POC Granulocyte 05/28/2016  3.0  2 - 6.9 Final  . Granulocyte percent 05/28/2016 74.2  37 - 80 %G Final  . RBC 05/28/2016 4.43  4.04 - 5.48 M/uL Final  . Hemoglobin 05/28/2016 12.8  12.2 - 16.2 g/dL Final  . HCT, POC 24/49/7458 37.1* 37.7 - 47.9 % Final  . MCV 05/28/2016 83.8  80 - 97 fL Final  . MCH, POC 05/28/2016 28.9  27 - 31.2 pg Final  . MCHC 05/28/2016 34.5  31.8 - 35.4 g/dL Final  . RDW, POC 41/26/2199 14.6  % Final  . Platelet Count, POC 05/28/2016 233  142 - 424 K/uL Final  . MPV 05/28/2016 6.6  0 - 99.8 fL Final  . Urine Culture, Routine 05/28/2016 Final report   Final  . Urine Culture result 1 05/28/2016 Lactobacillus species   Final   Comment: Greater than 100,000 colony forming units per mL Susceptibility not normally performed on this organism.   . Campylobacter 05/29/2016 Not Detected  Not Detected Final  . C difficile toxin A/B 05/29/2016 Not Detected  Not Detected Final  . Plesiomonas shigelloides 05/29/2016 Not Detected  Not Detected Final  . Salmonella 05/29/2016 Not Detected  Not Detected Final  . Vibrio 05/29/2016 Not Detected  Not Detected Final  . Vibrio cholerae 05/29/2016 Not Detected  Not Detected Final  . Yersinia enterocolitica 05/29/2016 Not Detected  Not Detected Final  . Enteroaggregative E coli 05/29/2016 Not Detected  Not Detected Final  . Enteropathogenic E coli 05/29/2016 Not Detected  Not Detected Final  . Enterotoxigenic E coli 05/29/2016 Not Detected  Not Detected Final  . Shiga-toxin-producing E coli 05/29/2016 Not Detected  Not Detected Final  . E coli O157 05/29/2016 Not applicable  Not Detected Final  . Shigella/Enteroinvasive E coli 05/29/2016 Not Detected  Not Detected Final  . Cryptosporidium 05/29/2016 Not Detected  Not Detected Final  . Cyclospora cayetanensis 05/29/2016 Not Detected  Not Detected Final  . Entamoeba  histolytica 05/29/2016 Not Detected  Not Detected Final  . Giardia lamblia 05/29/2016 Not Detected  Not Detected Final  . Adenovirus F 40/41 05/29/2016 Not Detected  Not Detected Final  . Astrovirus 05/29/2016 Not Detected  Not Detected Final  . Norovirus GI/GII 05/29/2016 Detected* Not Detected Final  . Rotavirus A 05/29/2016 Not Detected  Not Detected Final  . Sapovirus 05/29/2016 Not Detected  Not Detected Final  Office Visit on 03/12/2016  Component Date Value Ref Range Status  . WBC 03/12/2016 4.3  3.8 - 10.8 K/uL Final  . RBC 03/12/2016 4.24  3.80 - 5.10 MIL/uL Final  . Hemoglobin 03/12/2016 11.9  11.7 - 15.5 g/dL Final  . HCT 94/78/2976 36.5  35.0 - 45.0 % Final  . MCV 03/12/2016 86.1  80.0 - 100.0 fL Final  . MCH 03/12/2016 28.1  27.0 - 33.0 pg Final  . MCHC 03/12/2016 32.6  32.0 - 36.0 g/dL Final  . RDW 50/22/8614 13.6  11.0 - 15.0 % Final  . Platelets 03/12/2016 261  140 - 400 K/uL Final  . MPV 03/12/2016 9.6  7.5 - 12.5 fL Final  . Neutro Abs 03/12/2016 3225  1,500 - 7,800 cells/uL Final  . Lymphs Abs 03/12/2016 559* 850 - 3,900 cells/uL Final  . Monocytes Absolute 03/12/2016 430  200 - 950 cells/uL Final  . Eosinophils Absolute 03/12/2016 86  15 - 500 cells/uL Final  . Basophils Absolute 03/12/2016 0  0 - 200 cells/uL Final  . Neutrophils Relative % 03/12/2016 75  % Final  . Lymphocytes Relative 03/12/2016  13  % Final  . Monocytes Relative 03/12/2016 10  % Final  . Eosinophils Relative 03/12/2016 2  % Final  . Basophils Relative 03/12/2016 0  % Final  . Smear Review 03/12/2016 Criteria for review not met   Final  . Sodium 03/12/2016 139  135 - 146 mmol/L Final  . Potassium 03/12/2016 4.6  3.5 - 5.3 mmol/L Final  . Chloride 03/12/2016 103  98 - 110 mmol/L Final  . CO2 03/12/2016 28  20 - 31 mmol/L Final  . Glucose, Bld 03/12/2016 108* 65 - 99 mg/dL Final  . BUN 03/12/2016 12  7 - 25 mg/dL Final  . Creat 03/12/2016 0.75  0.50 - 0.99 mg/dL Final   Comment:   For  patients > or = 64 years of age: The upper reference limit for Creatinine is approximately 13% higher for people identified as African-American.     . Total Bilirubin 03/12/2016 0.4  0.2 - 1.2 mg/dL Final  . Alkaline Phosphatase 03/12/2016 70  33 - 130 U/L Final  . AST 03/12/2016 20  10 - 35 U/L Final  . ALT 03/12/2016 16  6 - 29 U/L Final  . Total Protein 03/12/2016 6.9  6.1 - 8.1 g/dL Final  . Albumin 03/12/2016 4.4  3.6 - 5.1 g/dL Final  . Calcium 03/12/2016 9.2  8.6 - 10.4 mg/dL Final  . GFR, Est African American 03/12/2016 >89  >=60 mL/min Final  . GFR, Est Non African American 03/12/2016 85  >=60 mL/min Final  . TSH 03/12/2016 2.46  mIU/L Final   Comment:   Reference Range   > or = 20 Years  0.40-4.50   Pregnancy Range First trimester  0.26-2.66 Second trimester 0.55-2.73 Third trimester  0.43-2.91     . Vit D, 25-Hydroxy 03/12/2016 28* 30 - 100 ng/mL Final   Comment: Vitamin D Status           25-OH Vitamin D        Deficiency                <20 ng/mL        Insufficiency         20 - 29 ng/mL        Optimal             > or = 30 ng/mL   For 25-OH Vitamin D testing on patients on D2-supplementation and patients for whom quantitation of D2 and D3 fractions is required, the Quest[facility] 25-OH VIT D, (D2,D3), LC/MS/MS is recommended: order code [id number] (patients > 2 yrs).   Marland Kitchen PTH 03/12/2016 47  14 - 64 pg/mL Final   Comment:   Interpretive Guide:                              Intact PTH               Calcium                              ----------               ------- Normal Parathyroid           Normal                   Normal Hypoparathyroidism           Low or Low Normal  Low Hyperparathyroidism      Primary                 Normal or High           High      Secondary               High                     Normal or Low      Tertiary                High                     High Non-Parathyroid   Hypercalcemia              Low or Low Normal        High   .  Total Protein, Serum Electrophores* 03/12/2016 6.9  6.1 - 8.1 g/dL Final  . Albumin ELP 03/12/2016 4.2  3.8 - 4.8 g/dL Final  . Alpha-1-Globulin 03/12/2016 0.2  0.2 - 0.3 g/dL Final  . Alpha-2-Globulin 03/12/2016 0.7  0.5 - 0.9 g/dL Final  . Beta Globulin 03/12/2016 0.5  0.4 - 0.6 g/dL Final  . Beta 2 03/12/2016 0.3  0.2 - 0.5 g/dL Final  . Gamma Globulin 03/12/2016 0.9  0.8 - 1.7 g/dL Final  . Abnormal Protein Band1 03/12/2016 NOT DET  g/dL Final  . SPE Interp. 03/12/2016 SEE NOTE   Final   Comment: Normal Electrophoretic Pattern Reviewed by Francis Gaines. Mammarappallil MD (Electronic Signature on File)   . Abnormal Protein Band2 03/12/2016 NOT DET  g/dL Final  . Abnormal Protein Band3 03/12/2016 NOT DET  g/dL Final  . Gliadin IgG 03/12/2016 4  <20 Units Final   Comment: Value Interpretation:  <20:   Antibody Not Detected >=20:   Antibody Detected   . Gliadin IgA 03/12/2016 12  <20 Units Final   Comment: Value Interpretation:  <20:   Antibody Not Detected >=20:   Antibody Detected   . Tissue Transglutaminase Ab, IgA 03/12/2016 1  <4 U/mL Final   Comment: Value Interpretation:   <4:   Antibody Not Detected  >=4:   Antibody Detected     Imaging: Mm Digital Screening Bilateral  Result Date: 06/11/2016 CLINICAL DATA:  Screening. EXAM: DIGITAL SCREENING BILATERAL MAMMOGRAM WITH CAD COMPARISON:  Previous exam(s). ACR Breast Density Category b: There are scattered areas of fibroglandular density. FINDINGS: There are no findings suspicious for malignancy. Images were processed with CAD. IMPRESSION: No mammographic evidence of malignancy. A result letter of this screening mammogram will be mailed directly to the patient. RECOMMENDATION: Screening mammogram in one year. (Code:SM-B-01Y) BI-RADS CATEGORY  1: Negative. Electronically Signed   By: Abelardo Diesel M.D.   On: 06/11/2016 10:01    Speciality Comments: No specialty comments available.    Procedures:  No procedures  performed Allergies: Nitrofurantoin; Sulfonamide derivatives; Cefdinir; and Copaxone [glatiramer acetate]   Assessment / Plan:     Visit Diagnoses: Primary osteoarthritis of both knees: She's not having much discomfort currently  Primary osteoarthritis of both hands: Involves CMC and DIP joints. Joint protection and muscle strengthening discussed.  Primary osteoarthritis of both feet: Proper fitting shoes were discussed.  Degenerative cervical disc: She is good range of motion of her C-spine  Chronic bilateral low back pain without sciatica: Chronic pain with scoliosis and this disease  Trochanteric bursitis of left hip: Improved after injection  and physical therapy  Age-related osteoporosis without current pathological fracture: She is on Fosamax for 1 year now she will have bone density with PCP next year.  Vitamin D deficiency: She's been taking supplement for vitamin D. Her vitamin D on 06/06/2016 was 42.5 which is within normal limits.  History of multiple sclerosis: Followed by neurologist  Other fatigue  History of anxiety    Orders: No orders of the defined types were placed in this encounter.  No orders of the defined types were placed in this encounter.   Face-to-face time spent with patient was 30  minutes. 50% of time was spent in counseling and coordination of care.  Follow-Up Instructions: No Follow-up on file.   Bo Merino, MD  Note - This record has been created using Editor, commissioning.  Chart creation errors have been sought, but may not always  have been located. Such creation errors do not reflect on  the standard of medical care.

## 2016-06-13 MED FILL — ADDERALL XR 20 MG CAP SA: 20 | 30 days supply | Qty: 30 | Fill #0

## 2016-06-18 ENCOUNTER — Ambulatory Visit (INDEPENDENT_AMBULATORY_CARE_PROVIDER_SITE_OTHER): Payer: 59 | Admitting: Diagnostic Neuroimaging

## 2016-06-18 ENCOUNTER — Encounter: Payer: Self-pay | Admitting: Diagnostic Neuroimaging

## 2016-06-18 VITALS — BP 102/72 | HR 78 | Wt 162.4 lb

## 2016-06-18 DIAGNOSIS — G35 Multiple sclerosis: Secondary | ICD-10-CM | POA: Diagnosis not present

## 2016-06-18 DIAGNOSIS — R41840 Attention and concentration deficit: Secondary | ICD-10-CM | POA: Diagnosis not present

## 2016-06-18 DIAGNOSIS — R5383 Other fatigue: Secondary | ICD-10-CM

## 2016-06-18 NOTE — Progress Notes (Signed)
PATIENT: Katie Maldonado DOB: 08-05-1950   REASON FOR VISIT: follow up for MS HISTORY FROM: patient  Chief Complaint  Patient presents with  . Multiple Sclerosis    rm 7, Tecfidera, "Adderall is helping but still fatigued; my back feels better; sleep study was negative"  . Follow-up    3 month     HISTORY OF PRESENT ILLNESS:  UPDATE 06/18/16: Since last visit, doing better. Better energy and concentration. PT helping with back pain.   UPDATE 03/06/16: Since last visit, continues with fatigue, poor attention, difficulty at work. She is in corrective action due to errors. Denies depression, but has excessive daytime fatigue.   UPDATE 08/31/15: Since last visit continues with fatigue, pain, numbness. Tolerating tecfidera. Has tried PT. Urinary issues better. Now recovering from right arm celluitis (was painting, developed blister, then got infected; now better on oral antibiotics).  UPDATE 07/07/15: Since last visit, still with left neck pain radiating to the left arm. Numb/tingling. Has h/o left forearm/wrist fracture (2005 and 2010; s/p surgery). Amantadine not helping with fatigue. Denies depression.   UPDATE 05/31/15: Since last visit, sxs progressing since Oct 2016. More fatigue, more bladder/bowel issues. More left arm numbness.  UPDATE 11/28/14: Since last visit, continues to have prickly, tingling sensation in whole body, hot flash sensations, pain, neck issues. Had 2 falls, but no injuries.  UPDATE 08/23/14: Since last visit, has been on tecfidera since beginning March 2016. She has noted more general numbness, fatigue, constipation, drawstring sensation in calves/hamstrings in past 3 weeks.   UPDATE 05/18/14: MS symptoms stable. Has had 2 reactions to copaxone, leading to tinging, prickly sensations over whole body. 1 event led to throat tightness, tongue feeling thick, and worried patient. Here to discuss possibilities of switching therapies.  UPDATE 01/04/14 (VRP): Since last  visit, doing the same. No new neuro symptoms. Does report persistent fatigue, weight gain, incr appetite, feeling hot. Tolerating copaxone. Misses 2-3 injections per month.   UPDATE 06/30/13 (LL):  Since last visit patient continues to have problems with fatigue and malaise, numbness and paresthesias in left arm which is new for her, started a couple weeks ago.  She states it is constant, does not hurt, but annoying.  She is still able to work.  Follow up MRI brain at last visit showed no acute or new lesions, with moderate cerebral atrophy.  UPDATE 12/30/12 (VP): Since last visit patient continues to have problems with fatigue, insomnia, anxiety, shortness of breath. For past 6 months she's been having dyspnea on exertion especially climbing steps at her work. She has been evaluated by PCP without specific diagnosis. Patient continues on Copaxone (now 3 times per week dosing). Last MRI brain was in 2011. Regarding fatigue she has been on Provigil and Ritalin in the past without relief. Regarding anxiety she was on Paxil in the past but this caused nausea and stomach problems.   PRIOR HPI (05/02/12, Dr. 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 Her MRI of brain 06/10/1999 showed white matter abnormalities not typical for MS and MRI of the cervical spine without contrast 07/27/99 showed a focal area of abnormality in the spinal cord at C2-3. She had positive CSF IgG index and oligoclonal banding. Initially the VER was normal with subsequent VERs 12/04/1999, 12/10/1999, and 02/01/2000 abnormal in the right eye. She began Avonex which she took for one year and switched to Rebif beginning  08/2000. She was then switched to Betaseron 04/2001 because her insurance would not pay for Rebif, but was subsequently switched back to Rebif 04/2003. Because of elevated liver function tests she was switched to Copaxone  07/03/2005.She has felt better on Copaxone then on the interferons. Her visual acuity varies from 20/30 to 20/40 in the left eye and to 20/200 in the right. She had burning paresthesias treated with gabapentin and at times amitriptyline. NMO blood test was negative 02/21/2005. She has been followed Dr. Estanislado Pandy, rheumatologist for the question of RA versus sarcoidosis and by Dr.Clinton Young for hypersomnolence without a diagnosis of narcolepsy or obstructive Sleep apnea. MRI studies of the brain and cervical spine 02/08/2010 showed periventricular lesions and unchanged lesion at C2-3. CBC and CMP 03/17/2009 were normal. CBC, CMP, TSH, lipid profile except LDL 151 were normal 08/07/09.vitamin D level 10/16/2009 was 41. She complains of fatigue and her right foot turning in an occasional foot cramping. She fell 10/2009 fracturing her right foot in a [employer] parking lot. In 2010 she fractured her left foot, left wrist, and her left arm. She has continued swelling in her right foot and lower leg. She has an overactive bladder followed by Dr.Dalhstedt. She has a history of Lhermitte's sign worse with neck extension but also present with flexion. She had numbness on the right side of her body except her face and head for one month.Her bladder symptoms improved on enablex.She walks 1.8 miles 3 times per week. 03/05/2011 she was lying in bed asleep and awoke trying to sit up and developed dizziness on 2 different occasions. She fell back in to the bed and went to sleep. On the third episode she became concerned. The episodes of dizziness would last seconds and are described as spinning. She had dizziness during the day without spinning She did not have head trauma or a cold. She was seen by Dr. Elder Cyphers at urgent care and underwent blood studies, EKG, and urinalysis. She has a history of migraine and a history of motion sickness. She has a Lhermitte's sign when she turns her head to the left with discomfort in her neck going into  her left arm. This can occur 2 times per day and lasts Less than 1 minute. She has bladder symptoms and right flank pain and is concerned about another urinary tract infection. She can awaken with snoring when she has a cold. She sleeps well. She has dizziness 2 or 3 times per week when she lies down and turns her head to the left.    REVIEW OF SYSTEMS: Full 14 system review of systems performed and negative except: decr concentration anxiety nausea ringing in ears fatigue.    ALLERGIES: Allergies  Allergen Reactions  . Nitrofurantoin Shortness Of Breath    Shortness of breath, violent chills, dizziness, nausea  . Sulfonamide Derivatives Shortness Of Breath    Shortness of breath, chills  . Cefdinir     Can't remember what rx had 2 yrs ago  . Copaxone [Glatiramer Acetate]     HOME MEDICATIONS: Outpatient Medications Prior to Visit  Medication Sig Dispense Refill  . alendronate (FOSAMAX) 70 MG tablet Take 1 tablet (70 mg total) by mouth once a week. Take with a full glass of water on an empty stomach. 12 tablet 3  . amphetamine-dextroamphetamine (ADDERALL XR) 20 MG 24 hr capsule Take 1 capsule (20 mg total) by mouth daily. 30 capsule 0  . aspirin EC 81 MG tablet Take 1 tablet (81 mg total) by  mouth daily.    . cholecalciferol (VITAMIN D) 1000 UNITS tablet Take 1,000 Units by mouth daily. Reported on 08/08/2015    . citalopram (CELEXA) 20 MG tablet Take 1 tablet (20 mg total) by mouth daily. 90 tablet 1  . Dimethyl Fumarate (TECFIDERA) 240 MG CPDR Take 240 mg by mouth 2 (two) times daily.    Marland Kitchen ibuprofen (ADVIL,MOTRIN) 200 MG tablet Take 2 tablets (400 mg total) by mouth 4 (four) times daily.    . mirabegron ER (MYRBETRIQ) 50 MG TB24 tablet Take 50 mg by mouth daily.    . Omega-3 Fatty Acids (FISH OIL) 1000 MG CAPS Take 1,000 mg by mouth 2 (two) times daily.   0  . omeprazole (PRILOSEC) 20 MG capsule Take 1 capsule (20 mg total) by mouth daily. 90 capsule 1  . ondansetron (ZOFRAN-ODT) 4 MG  disintegrating tablet Take 1 tablet (4 mg total) by mouth every 8 (eight) hours as needed for nausea or vomiting. 60 tablet 2  . TURMERIC PO Take 1 tablet by mouth daily.    . hyoscyamine (LEVSIN SL) 0.125 MG SL tablet   0  . hyoscyamine (LEVSIN) 0.125 MG tablet Take 1 tablet (0.125 mg total) by mouth every 6 (six) hours as needed. For abdominal cramping 20 tablet 0   Facility-Administered Medications Prior to Visit  Medication Dose Route Frequency Provider Last Rate Last Dose  . gadopentetate dimeglumine (MAGNEVIST) injection 15 mL  15 mL Intravenous Once PRN Penni Bombard, MD      . gadopentetate dimeglumine (MAGNEVIST) injection 16 mL  16 mL Intravenous Once PRN Penni Bombard, MD       PHYSICAL EXAM  Vitals:   06/18/16 1528  BP: 102/72  Pulse: 78  Weight: 162 lb 6.4 oz (73.7 kg)   Body mass index is 27.45 kg/m.  GENERAL EXAM/CONSTITUTIONAL: Vitals:  Vitals:   06/18/16 1528  BP: 102/72  Pulse: 78  Weight: 162 lb 6.4 oz (73.7 kg)    Patient is in no distress; well developed, nourished and groomed; neck is supple  CARDIOVASCULAR:  Examination of carotid arteries is normal; no carotid bruits  Regular rate and rhythm, no murmurs  Examination of peripheral vascular system by observation and palpation is normal  EYES:  Ophthalmoscopic exam of optic discs and posterior segments is normal; no papilledema or hemorrhages  MUSCULOSKELETAL:  Gait, strength, tone, movements noted in Neurologic exam below  NEUROLOGIC: MENTAL STATUS:   awake, alert, oriented to person, place and time  recent and remote memory intact  normal attention and concentration  language fluent, comprehension intact, naming intact,   fund of knowledge appropriate  CRANIAL NERVE:   2nd - no papilledema on fundoscopic exam  2nd, 3rd, 4th, 6th - pupils equal and reactive to light, visual fields full to confrontation, extraocular muscles intact, no nystagmus  5th - facial sensation  symmetric  7th - facial strength symmetric  8th - hearing intact  9th - palate elevates symmetrically, uvula midline  11th - shoulder shrug symmetric  12th - tongue protrusion midline  MOTOR:   normal bulk and tone, full strength in the BUE, BLE   SENSORY:   normal and symmetric to light touch, temperature, vibration  COORDINATION:   finger-nose-finger, fine finger movements normal  REFLEXES:   deep tendon reflexes present and symmetric  GAIT/STATION:   narrow based gait    DIAGNOSTIC DATA (LABS, IMAGING, TESTING) - I reviewed patient records, labs, notes, testing and imaging myself where available.  Lab  Results  Component Value Date   WBC 5.0 06/05/2016   HGB 12.8 05/28/2016   HCT 37.1 06/05/2016   MCV 84 06/05/2016   PLT 257 06/05/2016   Lymphocytes Absolute  Date Value Ref Range Status  06/05/2016 0.7 0.7 - 3.1 x10E3/uL Final  12/05/2015 0.8 0.7 - 3.1 x10E3/uL Final  08/31/2015 0.6 (L) 0.7 - 3.1 x10E3/uL Final   Lymphs Abs  Date Value Ref Range Status  03/12/2016 559 (L) 850 - 3,900 cells/uL Final   CMP Latest Ref Rng & Units 06/05/2016 03/12/2016 08/31/2015  Glucose 65 - 99 mg/dL 80 108(H) 83  BUN 8 - 27 mg/dL 9 12 15   Creatinine 0.57 - 1.00 mg/dL 0.73 0.75 0.71  Sodium 134 - 144 mmol/L 141 139 139  Potassium 3.5 - 5.2 mmol/L 5.1 4.6 4.6  Chloride 96 - 106 mmol/L 100 103 99  CO2 18 - 29 mmol/L 23 28 24   Calcium 8.7 - 10.3 mg/dL 9.5 9.2 9.1  Total Protein 6.0 - 8.5 g/dL 7.0 6.9 6.6  Total Bilirubin 0.0 - 1.2 mg/dL 0.6 0.4 0.4  Alkaline Phos 39 - 117 IU/L 78 70 81  AST 0 - 40 IU/L 23 20 15   ALT 0 - 32 IU/L 21 16 13    Lab Results  Component Value Date   TSH 2.290 06/05/2016    08/31/14 MRI brain - showing T2/flair hyperintense foci in the periventricular, deep and subcortical matter in a pattern and configuration consistent with the diagnosis of multiple sclerosis. No acute foci are noted. Generalized cortical atrophy is noted. When compared to a  study dated 05/26/2014, there is no interval change.  08/31/14 MRI cervical spine -  1. Severe degenerative changes at C4-C5, C5-C6 and C6-C7 with there is disc protrusion, uncovertebral spurring and facet hypertrophy. There is mild spinal stenosis at each of these 3 levels. There is also moderately severe foraminal narrowing which could lead to impingement of the exiting C5, C6 and C7 nerve roots. 2. Degenerative changes are milder at C2-C3, C3-C4 and C7-T1 and are less likely to lead to nerve root impingement.  3. The spinal cord appears normal.  4. There are no enhancing abnormalities.  5. Compared to the MRI of the cervical spine dated 07/07/2013, there is no definite interval change.   06/14/15 MRI brain  1. Mild periventricular and subcortical and infratentorial chronic demyelinating plaques.  2. No abnormal lesions are seen on post contrast views.  3. No change from MRI on 08/31/14.   06/28/15 MRI cervical  - severe degenerative changes at C4-5, C5-6 and C6-7 resulting in broad-based disc protrusions, uncovertebral spurring and facet hypertrophy resulting in mild canal and moderate bilateral foraminal narrowing with likely encroachment on the exiting C5, C6 and C7 nerve roots. No enhancing or demyelinating lesions are noted. Overall no significant change compared with previous MRI scan dated 08/31/2014.  08/31/15 anti-JCV antibody - positive    ASSESSMENT AND PLAN  66 y.o. female here with multiple sclerosis since 2001, on copaxone since 2007. Previously on avonex and betaseron.  Then apparent intolerance to copaxone with 2 events of tingling, throat tightness, thick tongue with 2 injections. Now on tecfidera since March 2016. Some progression of symptoms since Oct 2016, but MRI scans are stable.  Left neck and arm symptoms likely related to cervical radiculopathies. Will try conservative treatment options.  Tried gabapentin for numbness --> stopped due to sleepiness.  Tried  ritalin, provigil, amantadine for fatigue --> stopped due to in-effectiveness.    Dx:  Multiple sclerosis ([place])  Other fatigue  Attention and concentration deficit    DEPRESSION SCREENING Depression screen [facility] 2/9 06/05/2016 03/06/2016 12/25/2015  Decreased Interest 0 2 0  Down, Depressed, Hopeless 0 0 0  PHQ - 2 Score 0 2 0  Altered sleeping - 0 -  Tired, decreased energy - 3 -  Change in appetite - 2 -  Feeling bad or failure about yourself  - 1 -  Trouble concentrating - 2 -  Moving slowly or fidgety/restless - 2 -  Suicidal thoughts - 0 -  PHQ-9 Score - 12 -  Difficult doing work/chores - Very difficult -    PLAN: - continue tecfidera - continue gentle yoga classes and PT exercises - continue adderall for fatigue and poor attention  Return in about 6 months (around 12/16/2016).    Penni Bombard, MD [id number], [phone] PM Certified in Neurology, Neurophysiology and Neuroimaging  [facility] [address] [place], [place] [address] [phone]

## 2016-06-20 ENCOUNTER — Ambulatory Visit (INDEPENDENT_AMBULATORY_CARE_PROVIDER_SITE_OTHER): Payer: 59 | Admitting: Rheumatology

## 2016-06-20 ENCOUNTER — Encounter: Payer: Self-pay | Admitting: Rheumatology

## 2016-06-20 VITALS — BP 118/68 | HR 66 | Resp 16 | Wt 160.0 lb

## 2016-06-20 DIAGNOSIS — M19071 Primary osteoarthritis, right ankle and foot: Secondary | ICD-10-CM

## 2016-06-20 DIAGNOSIS — M5030 Other cervical disc degeneration, unspecified cervical region: Secondary | ICD-10-CM | POA: Diagnosis not present

## 2016-06-20 DIAGNOSIS — Z8659 Personal history of other mental and behavioral disorders: Secondary | ICD-10-CM | POA: Insufficient documentation

## 2016-06-20 DIAGNOSIS — M545 Low back pain, unspecified: Secondary | ICD-10-CM

## 2016-06-20 DIAGNOSIS — G8929 Other chronic pain: Secondary | ICD-10-CM

## 2016-06-20 DIAGNOSIS — M19042 Primary osteoarthritis, left hand: Secondary | ICD-10-CM

## 2016-06-20 DIAGNOSIS — Z8669 Personal history of other diseases of the nervous system and sense organs: Secondary | ICD-10-CM

## 2016-06-20 DIAGNOSIS — M7062 Trochanteric bursitis, left hip: Secondary | ICD-10-CM

## 2016-06-20 DIAGNOSIS — G35 Multiple sclerosis: Secondary | ICD-10-CM | POA: Insufficient documentation

## 2016-06-20 DIAGNOSIS — M19072 Primary osteoarthritis, left ankle and foot: Secondary | ICD-10-CM

## 2016-06-20 DIAGNOSIS — M19041 Primary osteoarthritis, right hand: Secondary | ICD-10-CM

## 2016-06-20 DIAGNOSIS — M810 Age-related osteoporosis without current pathological fracture: Secondary | ICD-10-CM

## 2016-06-20 DIAGNOSIS — R5383 Other fatigue: Secondary | ICD-10-CM

## 2016-06-20 DIAGNOSIS — M170 Bilateral primary osteoarthritis of knee: Secondary | ICD-10-CM | POA: Diagnosis not present

## 2016-06-20 DIAGNOSIS — E559 Vitamin D deficiency, unspecified: Secondary | ICD-10-CM

## 2016-07-15 ENCOUNTER — Encounter: Payer: Self-pay | Admitting: Physician Assistant

## 2016-07-15 ENCOUNTER — Ambulatory Visit (INDEPENDENT_AMBULATORY_CARE_PROVIDER_SITE_OTHER): Payer: 59 | Admitting: Emergency Medicine

## 2016-07-15 ENCOUNTER — Ambulatory Visit (INDEPENDENT_AMBULATORY_CARE_PROVIDER_SITE_OTHER): Payer: 59

## 2016-07-15 VITALS — BP 128/78 | HR 74 | Temp 98.6°F | Resp 18 | Ht 64.5 in | Wt 160.8 lb

## 2016-07-15 DIAGNOSIS — W19XXXA Unspecified fall, initial encounter: Secondary | ICD-10-CM | POA: Diagnosis not present

## 2016-07-15 DIAGNOSIS — S20212A Contusion of left front wall of thorax, initial encounter: Secondary | ICD-10-CM

## 2016-07-15 DIAGNOSIS — R0781 Pleurodynia: Secondary | ICD-10-CM

## 2016-07-15 DIAGNOSIS — S299XXA Unspecified injury of thorax, initial encounter: Secondary | ICD-10-CM | POA: Diagnosis not present

## 2016-07-15 MED ORDER — HYDROCODONE-ACETAMINOPHEN 5-325 MG PO TABS: 1.0000 | ORAL_TABLET | Freq: Four times a day (QID) | ORAL | 0 refills | Status: DC | PRN

## 2016-07-15 NOTE — Progress Notes (Signed)
Katie Maldonado 66 y.o.   Chief Complaint  Patient presents with  . Rib Injury    fell at home on 07/14/16- left rib hurting    HISTORY OF PRESENT ILLNESS: This is a 66 y.o. female complaining of left sided ribcage injury/pain; fell yesterday while chasing grandson; denies LOC or any other injuries. c/o severe pain to left frontal rib cage. HPI   Prior to Admission medications   Medication Sig Start Date End Date Taking? Authorizing Provider  alendronate (FOSAMAX) 70 MG tablet Take 1 tablet (70 mg total) by mouth once a week. Take with a full glass of water on an empty stomach. 04/12/15  Yes Elby Beck, FNP  amphetamine-dextroamphetamine (ADDERALL XR) 20 MG 24 hr capsule Take 1 capsule (20 mg total) by mouth daily. 06/10/16  Yes Penni Bombard, MD  aspirin EC 81 MG tablet Take 1 tablet (81 mg total) by mouth daily. 04/18/11  Yes Zenia Resides, MD  Biotin 10 MG TABS Take by mouth.   Yes Historical Provider, MD  cholecalciferol (VITAMIN D) 1000 UNITS tablet Take 1,000 Units by mouth daily. Reported on 08/08/2015   Yes Historical Provider, MD  citalopram (CELEXA) 20 MG tablet Take 1 tablet (20 mg total) by mouth daily. 06/05/16  Yes Leonie Douglas, PA-C  Dimethyl Fumarate (TECFIDERA) 240 MG CPDR Take 240 mg by mouth 2 (two) times daily.   Yes Historical Provider, MD  ibuprofen (ADVIL,MOTRIN) 200 MG tablet Take 2 tablets (400 mg total) by mouth 4 (four) times daily. 04/18/11  Yes Zenia Resides, MD  mirabegron ER (MYRBETRIQ) 50 MG TB24 tablet Take 50 mg by mouth daily.   Yes Historical Provider, MD  Omega-3 Fatty Acids (FISH OIL) 1000 MG CAPS Take 1,000 mg by mouth 2 (two) times daily.  04/18/11  Yes Zenia Resides, MD  omeprazole (PRILOSEC) 20 MG capsule Take 1 capsule (20 mg total) by mouth daily. 06/05/16  Yes Leonie Douglas, PA-C  ondansetron (ZOFRAN-ODT) 4 MG disintegrating tablet Take 1 tablet (4 mg total) by mouth every 8 (eight) hours as needed for nausea or  vomiting. 11/03/15  Yes Leonie Douglas, PA-C  TURMERIC PO Take 1 tablet by mouth daily.   Yes Historical Provider, MD  HYDROcodone-acetaminophen (NORCO) 5-325 MG tablet Take 1 tablet by mouth every 6 (six) hours as needed for moderate pain. 07/15/16   Horald Pollen, MD    Allergies  Allergen Reactions  . Nitrofurantoin Shortness Of Breath    Shortness of breath, violent chills, dizziness, nausea  . Sulfonamide Derivatives Shortness Of Breath    Shortness of breath, chills  . Cefdinir     Can't remember what rx had 2 yrs ago  . Copaxone [Glatiramer Acetate]     Patient Active Problem List   Diagnosis Date Noted  . Rib contusion, left, initial encounter 07/15/2016  . Rib pain on left side 07/15/2016  . Accidental fall 07/15/2016  . History of multiple sclerosis 06/20/2016  . History of anxiety 06/20/2016  . Trochanteric bursitis of left hip 03/12/2016  . Primary osteoarthritis of both feet 03/09/2016  . Primary osteoarthritis of both knees 03/09/2016  . Degenerative cervical disc 10/04/2014  . Primary osteoarthritis of both hands 08/19/2014  . Low back pain 11/26/2013  . Numbness and tingling in left arm 07/09/2013  . Anxiety state, unspecified 12/30/2012  . Other fatigue 12/30/2012  . Osteoporosis 11/13/2012  . Incontinence of urine 04/18/2011  . HYPERLIPIDEMIA-MIXED 01/08/2010  . MULTIPLE SCLEROSIS  01/08/2010    Past Medical History:  Diagnosis Date  . Allergy   . Anxiety   . Arthritis   . Diverticulitis   . Diverticulosis 10/2015  . Hypersomnolence   . Migraine   . Multiple sclerosis ([place])   . Neuromuscular disorder ([place])   . Obesity, unspecified   . Osteoporosis   . Other and unspecified hyperlipidemia   . TIA (transient ischemic attack) 20    Past Surgical History:  Procedure Laterality Date  . ABDOMINAL HYSTERECTOMY     Fibroids  . arm surgery left    . TUBAL LIGATION    . WRIST SURGERY      Social History   Social History  . Marital  status: Married    Spouse name: Dyke Brackett"  . Number of children: 2  . Years of education: college   Occupational History  . 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 [place] IN[employer] [employer]   Social History Main Topics  . Smoking status: Former Smoker    Years: 37.00    Types: Cigarettes    Quit date: 02/26/2004  . Smokeless tobacco: Never Used  . Alcohol use 0.0 oz/week     Comment: occasionally   . Drug use: No  . Sexual activity: Yes    Birth control/ protection: None   Other Topics Concern  . Not on file   Social History Narrative   Patient is married [name]), has 2 children   Patient is right handed   Education level is 2 yrs of college   Caffeine consumption is 3-4 cups daily      Exercises at least 2-3 times a week. Walks about 5 miles a week.     Family History  Problem Relation Age of Onset  . Emphysema Father   . Cancer Brother     bone  . Heart disease Brother   . Cancer Maternal Grandmother   . Heart attack Brother   . Cancer Maternal Grandfather   . Cancer Paternal Grandmother   . Cancer Paternal Grandfather      Review of Systems  Constitutional: Negative.  Negative for chills and fever.  HENT: Negative.   Eyes: Negative for blurred vision and double vision.  Respiratory: Negative for cough, hemoptysis and shortness of breath.   Cardiovascular: Positive for chest pain (rib cage). Negative for palpitations.  Gastrointestinal: Negative for abdominal pain, diarrhea, nausea and vomiting.  Genitourinary: Negative for flank pain and hematuria.  Musculoskeletal: Negative for back pain and neck pain.  Skin: Negative for rash.  Neurological: Negative for dizziness, sensory change, focal weakness, loss of consciousness, weakness and headaches.  All other systems reviewed and are negative.  Vitals:   07/15/16 1747  BP: 128/78  Pulse: 74  Resp: 18  Temp: 98.6 F (37 C)   O2 sat: 97% WNL  Physical Exam  Constitutional: She is oriented to  person, place, and time. She appears well-developed and well-nourished.  HENT:  Head: Normocephalic.  Right Ear: External ear normal.  Left Ear: External ear normal.  Nose: Nose normal.  Mouth/Throat: Oropharynx is clear and moist.  Eyes: Conjunctivae and EOM are normal. Pupils are equal, round, and reactive to light.  Neck: Normal range of motion. Neck supple. No JVD present. No thyromegaly present.  Cardiovascular: Normal rate, regular rhythm and normal heart sounds.   Pulmonary/Chest: Effort normal and breath sounds normal. She exhibits tenderness (left frontal rib cage under breast area).  Abdominal: Soft. Bowel sounds are normal. She exhibits no  distension. There is no tenderness.  Musculoskeletal: Normal range of motion.  Lymphadenopathy:    She has no cervical adenopathy.  Neurological: She is alert and oriented to person, place, and time. No sensory deficit. She exhibits normal muscle tone.  Skin: Skin is warm and dry. Capillary refill takes less than 2 seconds.  Psychiatric: She has a normal mood and affect. Her behavior is normal.  Vitals reviewed.  CXR/ribs reviewed by me: NAD ASSESSMENT & PLAN: Zahara was seen today for rib injury.  Diagnoses and all orders for this visit:  Rib contusion, left, initial encounter -     DG Ribs Unilateral W/Chest Left; Future  Rib pain on left side  Accidental fall, initial encounter  Other orders -     HYDROcodone-acetaminophen (NORCO) 5-325 MG tablet; Take 1 tablet by mouth every 6 (six) hours as needed for moderate pain.    Patient Instructions       IF you received an x-ray today, you will receive an invoice from [facility]. Please contact [facility] at [phone] with questions or concerns regarding your invoice.   IF you received labwork today, you will receive an invoice from [facility]. Please contact [facility] at [phone] with questions or concerns regarding your invoice.   Our billing staff  will not be able to assist you with questions regarding bills from these companies.  You will be contacted with the lab results as soon as they are available. The fastest way to get your results is to activate your My Chart account. Instructions are located on the last page of this paperwork. If you have not heard from [place] regarding the results in 2 weeks, please contact this office.      Rib Contusion A rib contusion is a deep bruise on your rib area. Contusions are the result of a blunt trauma that causes bleeding and injury to the tissues under the skin. A rib contusion may involve bruising of the ribs and of the skin and muscles in the area. The skin overlying the contusion may turn blue, purple, or yellow. Minor injuries will give you a painless contusion, but more severe contusions may stay painful and swollen for a few weeks. What are the causes? A contusion is usually caused by a blow, trauma, or direct force to an area of the body. This often occurs while playing contact sports. What are the signs or symptoms?  Swelling and redness of the injured area.  Discoloration of the injured area.  Tenderness and soreness of the injured area.  Pain with or without movement. How is this diagnosed? The diagnosis can be made by taking a medical history and performing a physical exam. An X-ray, CT scan, or MRI may be needed to determine if there were any associated injuries, such as broken bones (fractures) or internal injuries. How is this treated? Often, the best treatment for a rib contusion is rest. Icing or applying cold compresses to the injured area may help reduce swelling and inflammation. Deep breathing exercises may be recommended to reduce the risk of partial lung collapse and pneumonia. Over-the-counter or prescription medicines may also be recommended for pain control. Follow these instructions at home:  Apply ice to the injured area:  Put ice in a plastic bag.  Place a towel  between your skin and the bag.  Leave the ice on for 20 minutes, 2-3 times per day.  Take medicines only as directed by your health care provider.  Rest the injured area. Avoid strenuous  activity and any activities or movements that cause pain. Be careful during activities and avoid bumping the injured area.  Perform deep-breathing exercises as directed by your health care provider.  Do not lift anything that is heavier than 5 lb (2.3 kg) until your health care provider approves.  Do not use any tobacco products, including cigarettes, chewing tobacco, or electronic cigarettes. If you need help quitting, ask your health care provider. Contact a health care provider if:  You have increased bruising or swelling.  You have pain that is not controlled with treatment.  You have a fever. Get help right away if:  You have difficulty breathing or shortness of breath.  You develop a continual cough, or you cough up thick or bloody sputum.  You feel sick to your stomach (nauseous), you throw up (vomit), or you have abdominal pain. This information is not intended to replace advice given to you by your health care provider. Make sure you discuss any questions you have with your health care provider. Document Released: 01/08/2001 Document Revised: 09/21/2015 Document Reviewed: 01/25/2014 Elsevier Interactive Patient Education  2017 Elsevier Inc.      Agustina Caroli, MD [facility]

## 2016-07-15 NOTE — Patient Instructions (Addendum)
IF you received an x-ray today, you will receive an invoice from [facility]. Please contact [facility] at [phone] with questions or concerns regarding your invoice.   IF you received labwork today, you will receive an invoice from [place]. Please contact [facility] at [phone] with questions or concerns regarding your invoice.   Our billing staff will not be able to assist you with questions regarding bills from these companies.  You will be contacted with the lab results as soon as they are available. The fastest way to get your results is to activate your My Chart account. Instructions are located on the last page of this paperwork. If you have not heard from [place] regarding the results in 2 weeks, please contact this office.      Rib Contusion A rib contusion is a deep bruise on your rib area. Contusions are the result of a blunt trauma that causes bleeding and injury to the tissues under the skin. A rib contusion may involve bruising of the ribs and of the skin and muscles in the area. The skin overlying the contusion may turn blue, purple, or yellow. Minor injuries will give you a painless contusion, but more severe contusions may stay painful and swollen for a few weeks. What are the causes? A contusion is usually caused by a blow, trauma, or direct force to an area of the body. This often occurs while playing contact sports. What are the signs or symptoms?  Swelling and redness of the injured area.  Discoloration of the injured area.  Tenderness and soreness of the injured area.  Pain with or without movement. How is this diagnosed? The diagnosis can be made by taking a medical history and performing a physical exam. An X-ray, CT scan, or MRI may be needed to determine if there were any associated injuries, such as broken bones (fractures) or internal injuries. How is this treated? Often, the best treatment for a rib contusion is rest. Icing or  applying cold compresses to the injured area may help reduce swelling and inflammation. Deep breathing exercises may be recommended to reduce the risk of partial lung collapse and pneumonia. Over-the-counter or prescription medicines may also be recommended for pain control. Follow these instructions at home:  Apply ice to the injured area:  Put ice in a plastic bag.  Place a towel between your skin and the bag.  Leave the ice on for 20 minutes, 2-3 times per day.  Take medicines only as directed by your health care provider.  Rest the injured area. Avoid strenuous activity and any activities or movements that cause pain. Be careful during activities and avoid bumping the injured area.  Perform deep-breathing exercises as directed by your health care provider.  Do not lift anything that is heavier than 5 lb (2.3 kg) until your health care provider approves.  Do not use any tobacco products, including cigarettes, chewing tobacco, or electronic cigarettes. If you need help quitting, ask your health care provider. Contact a health care provider if:  You have increased bruising or swelling.  You have pain that is not controlled with treatment.  You have a fever. Get help right away if:  You have difficulty breathing or shortness of breath.  You develop a continual cough, or you cough up thick or bloody sputum.  You feel sick to your stomach (nauseous), you throw up (vomit), or you have abdominal pain. This information is not intended to replace advice given to you by your  health care provider. Make sure you discuss any questions you have with your health care provider. Document Released: 01/08/2001 Document Revised: 09/21/2015 Document Reviewed: 01/25/2014 Elsevier Interactive Patient Education  2017 [employer].

## 2016-07-16 MED FILL — HYDROCODON-APAP 5-325: 5-325 | 4 days supply | Qty: 15 | Fill #0

## 2016-07-17 NOTE — Telephone Encounter (Signed)
p 

## 2016-07-29 ENCOUNTER — Emergency Department (HOSPITAL_BASED_OUTPATIENT_CLINIC_OR_DEPARTMENT_OTHER)
Admission: EM | Admit: 2016-07-29 | Discharge: 2016-07-29 | Disposition: A | Discharge status: Home / Self Care | Payer: 59 | Attending: Emergency Medicine | Admitting: Emergency Medicine

## 2016-07-29 ENCOUNTER — Ambulatory Visit (INDEPENDENT_AMBULATORY_CARE_PROVIDER_SITE_OTHER): Payer: 59 | Admitting: Physician Assistant

## 2016-07-29 ENCOUNTER — Encounter (HOSPITAL_BASED_OUTPATIENT_CLINIC_OR_DEPARTMENT_OTHER): Payer: Self-pay

## 2016-07-29 ENCOUNTER — Encounter: Payer: Self-pay | Admitting: Physician Assistant

## 2016-07-29 VITALS — BP 97/63 | HR 85 | Temp 98.6°F | Resp 18 | Ht 64.5 in | Wt 157.8 lb

## 2016-07-29 DIAGNOSIS — R1032 Left lower quadrant pain: Secondary | ICD-10-CM

## 2016-07-29 DIAGNOSIS — Z79899 Other long term (current) drug therapy: Secondary | ICD-10-CM | POA: Insufficient documentation

## 2016-07-29 DIAGNOSIS — R197 Diarrhea, unspecified: Secondary | ICD-10-CM

## 2016-07-29 DIAGNOSIS — Z87891 Personal history of nicotine dependence: Secondary | ICD-10-CM | POA: Insufficient documentation

## 2016-07-29 DIAGNOSIS — E860 Dehydration: Secondary | ICD-10-CM | POA: Diagnosis not present

## 2016-07-29 DIAGNOSIS — Z7982 Long term (current) use of aspirin: Secondary | ICD-10-CM | POA: Insufficient documentation

## 2016-07-29 LAB — POCT CBC
Granulocyte percent: 85.8 %G — AB (ref 37–80)
HCT, POC: 36.6 % — AB (ref 37.7–47.9)
HEMOGLOBIN: 12.3 g/dL (ref 12.2–16.2)
Lymph, poc: 0.3 — AB (ref 0.6–3.4)
MCH, POC: 28.8 pg (ref 27–31.2)
MCHC: 33.7 g/dL (ref 31.8–35.4)
MCV: 85.6 fL (ref 80–97)
MID (cbc): 0.2 (ref 0–0.9)
MPV: 6.6 fL (ref 0–99.8)
PLATELET COUNT, POC: 176 10*3/uL (ref 142–424)
POC Granulocyte: 2.8 (ref 2–6.9)
POC LYMPH PERCENT: 9.2 %L — AB (ref 10–50)
POC MID %: 5 %M (ref 0–12)
RBC: 4.28 M/uL (ref 4.04–5.48)
RDW, POC: 14.5 %
WBC: 3.3 10*3/uL — AB (ref 4.6–10.2)

## 2016-07-29 MED ORDER — SODIUM CHLORIDE 0.9 % IV BOLUS (SEPSIS): 1000.0000 mL | Freq: Once | INTRAVENOUS | Status: DC

## 2016-07-29 MED ORDER — CALCIUM POLYCARBOPHIL 625 MG PO TABS: 625.0000 mg | ORAL_TABLET | Freq: Every day | ORAL | 0 refills | Status: DC

## 2016-07-29 MED ORDER — BISMUTH SUBSALICYLATE 262 MG/15ML PO SUSP: 30.0000 mL | ORAL | 0 refills | Status: DC | PRN

## 2016-07-29 MED ORDER — BISMUTH SUBSALICYLATE 262 MG/15ML PO SUSP
30.0000 mL | ORAL | Status: DC | PRN
  Filled 2016-07-29: qty 118

## 2016-07-29 NOTE — ED Notes (Signed)
c/o diarrhea x 2 days, w nausea, left side abd pain, decreased urination,  Denies pain w urination

## 2016-07-29 NOTE — Progress Notes (Signed)
Katie Maldonado  MRN: [id number] DOB: May 02, 1950  Subjective:  Katie Maldonado is a 66 y.o. female seen in office today for a chief complaint of headache, fever (101), chills, left sided abdominal pain, decreased appetite, nausea, and diarrhea, which started 1 day ago. Denies recent antibiotic use, bloody stool, mucopurulent stool, and vomiting. She has had 5 "accidents" today where she has had accidental bowel movement on her self. Has tried immodium, zofran, and ibuprofen with no relief.  Of note, had ribs yesterday for lunch with the famly but no one else got sick. Denies smoking. Drinks wine occasionally. Has hx of diverticulitis. Surgical hx of hysterectomy.   Review of Systems  HENT: Negative for congestion, sinus pressure and sore throat.   Respiratory: Negative for cough.   Cardiovascular: Negative for chest pain, palpitations and leg swelling.  Gastrointestinal: Positive for constipation (prior to this event ). Negative for rectal pain.  Musculoskeletal: Positive for myalgias.  Neurological: Positive for light-headedness (baseline for patient). Negative for dizziness, tremors, speech difficulty and numbness.    Patient Active Problem List   Diagnosis Date Noted  . Rib contusion, left, initial encounter 07/15/2016  . Rib pain on left side 07/15/2016  . Accidental fall 07/15/2016  . History of multiple sclerosis 06/20/2016  . History of anxiety 06/20/2016  . Trochanteric bursitis of left hip 03/12/2016  . Primary osteoarthritis of both feet 03/09/2016  . Primary osteoarthritis of both knees 03/09/2016  . Degenerative cervical disc 10/04/2014  . Primary osteoarthritis of both hands 08/19/2014  . Low back pain 11/26/2013  . Numbness and tingling in left arm 07/09/2013  . Anxiety state, unspecified 12/30/2012  . Other fatigue 12/30/2012  . Osteoporosis 11/13/2012  . Incontinence of urine 04/18/2011  . HYPERLIPIDEMIA-MIXED 01/08/2010  . MULTIPLE SCLEROSIS  01/08/2010    Current Outpatient Prescriptions on File Prior to Visit  Medication Sig Dispense Refill  . alendronate (FOSAMAX) 70 MG tablet Take 1 tablet (70 mg total) by mouth once a week. Take with a full glass of water on an empty stomach. 12 tablet 3  . amphetamine-dextroamphetamine (ADDERALL XR) 20 MG 24 hr capsule Take 1 capsule (20 mg total) by mouth daily. 30 capsule 0  . aspirin EC 81 MG tablet Take 1 tablet (81 mg total) by mouth daily.    . Biotin 10 MG TABS Take by mouth.    . cholecalciferol (VITAMIN D) 1000 UNITS tablet Take 1,000 Units by mouth daily. Reported on 08/08/2015    . citalopram (CELEXA) 20 MG tablet Take 1 tablet (20 mg total) by mouth daily. 90 tablet 1  . Dimethyl Fumarate (TECFIDERA) 240 MG CPDR Take 240 mg by mouth 2 (two) times daily.    Marland Kitchen ibuprofen (ADVIL,MOTRIN) 200 MG tablet Take 2 tablets (400 mg total) by mouth 4 (four) times daily.    . Omega-3 Fatty Acids (FISH OIL) 1000 MG CAPS Take 1,000 mg by mouth 2 (two) times daily.   0  . omeprazole (PRILOSEC) 20 MG capsule Take 1 capsule (20 mg total) by mouth daily. 90 capsule 1  . ondansetron (ZOFRAN-ODT) 4 MG disintegrating tablet Take 1 tablet (4 mg total) by mouth every 8 (eight) hours as needed for nausea or vomiting. 60 tablet 2  . TURMERIC PO Take 1 tablet by mouth daily.    Marland Kitchen HYDROcodone-acetaminophen (NORCO) 5-325 MG tablet Take 1 tablet by mouth every 6 (six) hours as needed for moderate pain. (Patient not taking: Reported on 07/29/2016) 15 tablet 0  .  mirabegron ER (MYRBETRIQ) 50 MG TB24 tablet Take 50 mg by mouth daily.     Current Facility-Administered Medications on File Prior to Visit  Medication Dose Route Frequency Provider Last Rate Last Dose  . gadopentetate dimeglumine (MAGNEVIST) injection 15 mL  15 mL Intravenous Once PRN Vikram R Penumalli, MD      . gadopentetate dimeglumine (MAGNEVIST) injection 16 mL  16 mL Intravenous Once PRN Vikram R Penumalli, MD        Allergies  Allergen Reactions   . Nitrofurantoin Shortness Of Breath    Shortness of breath, violent chills, dizziness, nausea  . Sulfonamide Derivatives Shortness Of Breath    Shortness of breath, chills  . Cefdinir     Can't remember what rx had 2 yrs ago  . Copaxone [Glatiramer Acetate]      Objective:  BP 97/63   Pulse 85   Temp 98.6 F (37 C) (Oral)   Resp 18   Ht 5' 4.5" (1.638 m)   Wt 157 lb 12.8 oz (71.6 kg)   SpO2 98%   BMI 26.67 kg/m   Physical Exam  Constitutional: She is oriented to person, place, and time.  Well developed, well nourished, appears uncomfortable lying on exam table rubbing the left side of her abdomen.    HENT:  Head: Normocephalic and atraumatic.  Mouth/Throat: Mucous membranes are dry.  Eyes: Conjunctivae are normal.  Neck: Normal range of motion.  Cardiovascular: Normal rate, regular rhythm and normal heart sounds.   Pulmonary/Chest: Effort normal.  Abdominal: Soft. Normal appearance. She exhibits no distension. Bowel sounds are hyperactive. There is generalized tenderness (generalized but more noticeable with LUQ and LLQ). There is guarding (with palpation of left upper and lower quadrant).  Neurological: She is alert and oriented to person, place, and time. Gait normal.  Skin: Skin is warm and dry.  Psychiatric: Affect normal.  Vitals reviewed.   BP Readings from Last 3 Encounters:  07/29/16 97/63  07/15/16 128/78  06/20/16 118/68    Results for orders placed or performed in visit on 07/29/16 (from the past 24 hour(s))  POCT CBC     Status: Abnormal   Collection Time: 07/29/16  4:44 PM  Result Value Ref Range   WBC 3.3 (A) 4.6 - 10.2 K/uL   Lymph, poc 0.3 (A) 0.6 - 3.4   POC LYMPH PERCENT 9.2 (A) 10 - 50 %L   MID (cbc) 0.2 0 - 0.9   POC MID % 5.0 0 - 12 %M   POC Granulocyte 2.8 2 - 6.9   Granulocyte percent 85.8 (A) 37 - 80 %G   RBC 4.28 4.04 - 5.48 M/uL   Hemoglobin 12.3 12.2 - 16.2 g/dL   HCT, POC 36.6 (A) 37.7 - 47.9 %   MCV 85.6 80 - 97 fL   MCH, POC  28.8 27 - 31.2 pg   MCHC 33.7 31.8 - 35.4 g/dL   RDW, POC 14.5 %   Platelet Count, POC 176 142 - 424 K/uL   MPV 6.6 0 - 99.8 fL    Assessment and Plan :  This case was precepted with Dr. Sagardia, who also examined the patient.  1. Diarrhea, unspecified type - CMP14+EGFR - POCT CBC 2. LLQ pain Due to patient's history, vital signs, and PE findings of LLQ pain I believe she warrants further evaluation by the ED. She agrees to go by her own transportation to [facility] ED. I have called the ED and presented the patient's case   to an ED doctor so they are aware of her arrival.   Brittany Wiseman PA-C  [facility] 07/29/2016 5:27 PM  

## 2016-07-29 NOTE — ED Provider Notes (Signed)
[facility] Provider Note   CSN: [id number] Arrival date & time: 07/29/16  1825  By signing my name below, I, Dora Sims, attest that this documentation has been prepared under the direction and in the presence of physician practitioner, Leo Grosser, MD. Electronically Signed: Dora Sims, Scribe. 07/29/2016. 8:52 PM.  History   Chief Complaint Chief Complaint  Patient presents with  . Diarrhea    The history is provided by the patient. No language interpreter was used.  Diarrhea   This is a new problem. The current episode started yesterday. The problem occurs more than 10 times per day. The problem has not changed since onset.The stool consistency is described as watery. The maximum temperature recorded prior to her arrival was 100 to 100.9 F (yesterday). Pertinent negatives include no abdominal pain and no vomiting. She has tried anti-motility drugs and analgesics for the symptoms. The treatment provided no relief. Her past medical history does not include irritable bowel syndrome or inflammatory bowel disease.     HPI Comments: Katie Maldonado is a 66 y.o. female with PMHx including diverticulitis and diverticulosis who presents to the Emergency Department complaining of persistent diarrhea beginning yesterday. Pt reports associated nausea and a reduced appetite. She notes she measured a temperature of 101 yesterday but has not had any fevers today. Pt reports she has not been drinking as much as usual because "it makes me have diarrhea". She states she has had about 17 episodes of diarrhea per day since onset. Pt has tried Imodium and ibuprofen with no improvement of her diarrhea. She spoke to her PCP PTA today and was advised to come to the ED to receive IV fluids. No recent suspicious or undercooked foods. She last ate ribs and salad and notes no one else who ate with her is experiencing the same symptoms. She denies abdominal pain, vomiting, hematochezia, melena,  lightheadedness, or any other associated symptoms.  Past Medical History:  Diagnosis Date  . Allergy   . Anxiety   . Arthritis   . Diverticulitis   . Diverticulosis 10/2015  . Hypersomnolence   . Migraine   . Multiple sclerosis ([place])   . Neuromuscular disorder ([place])   . Obesity, unspecified   . Osteoporosis   . Other and unspecified hyperlipidemia   . TIA (transient ischemic attack) 20    Patient Active Problem List   Diagnosis Date Noted  . Rib contusion, left, initial encounter 07/15/2016  . Rib pain on left side 07/15/2016  . Accidental fall 07/15/2016  . History of multiple sclerosis 06/20/2016  . History of anxiety 06/20/2016  . Trochanteric bursitis of left hip 03/12/2016  . Primary osteoarthritis of both feet 03/09/2016  . Primary osteoarthritis of both knees 03/09/2016  . Degenerative cervical disc 10/04/2014  . Primary osteoarthritis of both hands 08/19/2014  . Low back pain 11/26/2013  . Numbness and tingling in left arm 07/09/2013  . Anxiety state, unspecified 12/30/2012  . Other fatigue 12/30/2012  . Osteoporosis 11/13/2012  . Incontinence of urine 04/18/2011  . HYPERLIPIDEMIA-MIXED 01/08/2010  . MULTIPLE SCLEROSIS 01/08/2010    Past Surgical History:  Procedure Laterality Date  . ABDOMINAL HYSTERECTOMY     Fibroids  . arm surgery left    . TUBAL LIGATION    . WRIST SURGERY      OB History    No data available       Home Medications    Prior to Admission medications   Medication Sig Start Date End Date  Taking? Authorizing Provider  alendronate (FOSAMAX) 70 MG tablet Take 1 tablet (70 mg total) by mouth once a week. Take with a full glass of water on an empty stomach. 04/12/15   Elby Beck, FNP  amphetamine-dextroamphetamine (ADDERALL XR) 20 MG 24 hr capsule Take 1 capsule (20 mg total) by mouth daily. 06/10/16   Penni Bombard, MD  aspirin EC 81 MG tablet Take 1 tablet (81 mg total) by mouth daily. 04/18/11   Zenia Resides, MD    Biotin 10 MG TABS Take by mouth.    Historical Provider, MD  cholecalciferol (VITAMIN D) 1000 UNITS tablet Take 1,000 Units by mouth daily. Reported on 08/08/2015    Historical Provider, MD  citalopram (CELEXA) 20 MG tablet Take 1 tablet (20 mg total) by mouth daily. 06/05/16   Leonie Douglas, PA-C  Dimethyl Fumarate (TECFIDERA) 240 MG CPDR Take 240 mg by mouth 2 (two) times daily.    Historical Provider, MD  HYDROcodone-acetaminophen (NORCO) 5-325 MG tablet Take 1 tablet by mouth every 6 (six) hours as needed for moderate pain. Patient not taking: Reported on 07/29/2016 07/15/16   Horald Pollen, MD  ibuprofen (ADVIL,MOTRIN) 200 MG tablet Take 2 tablets (400 mg total) by mouth 4 (four) times daily. 04/18/11   Zenia Resides, MD  mirabegron ER (MYRBETRIQ) 50 MG TB24 tablet Take 50 mg by mouth daily.    Historical Provider, MD  Omega-3 Fatty Acids (FISH OIL) 1000 MG CAPS Take 1,000 mg by mouth 2 (two) times daily.  04/18/11   Zenia Resides, MD  omeprazole (PRILOSEC) 20 MG capsule Take 1 capsule (20 mg total) by mouth daily. 06/05/16   Leonie Douglas, PA-C  ondansetron (ZOFRAN-ODT) 4 MG disintegrating tablet Take 1 tablet (4 mg total) by mouth every 8 (eight) hours as needed for nausea or vomiting. 11/03/15   Leonie Douglas, PA-C  TURMERIC PO Take 1 tablet by mouth daily.    Historical Provider, MD    Family History Family History  Problem Relation Age of Onset  . Emphysema Father   . Cancer Brother     bone  . Heart disease Brother   . Cancer Maternal Grandmother   . Heart attack Brother   . Cancer Maternal Grandfather   . Cancer Paternal Grandmother   . Cancer Paternal Grandfather     Social History Social History  Substance Use Topics  . Smoking status: Former Smoker    Years: 37.00    Types: Cigarettes    Quit date: 02/26/2004  . Smokeless tobacco: Never Used  . Alcohol use 0.0 oz/week     Comment: occasionally      Allergies   Nitrofurantoin; Sulfonamide  derivatives; Cefdinir; and Copaxone [glatiramer acetate]   Review of Systems Review of Systems  Constitutional: Positive for appetite change (decreased) and fever (yesterday, none since).  Gastrointestinal: Positive for diarrhea and nausea. Negative for abdominal pain, blood in stool and vomiting.       Negative for melena.  Neurological: Negative for light-headedness.  All other systems reviewed and are negative.  Physical Exam Updated Vital Signs BP 112/81 (BP Location: Left Arm)   Pulse 86   Temp 98.1 F (36.7 C) (Oral)   Resp 18   Ht 5\' 4"  (1.626 m)   Wt 157 lb (71.2 kg)   SpO2 100%   BMI 26.95 kg/m   Physical Exam  Constitutional: She is oriented to person, place, and time. She appears well-developed and well-nourished. No  distress.  HENT:  Head: Normocephalic.  Nose: Nose normal.  Eyes: Conjunctivae are normal.  Neck: Neck supple. No tracheal deviation present.  Cardiovascular: Normal rate and regular rhythm.   Pulmonary/Chest: Effort normal. No respiratory distress.  Abdominal: Soft. Bowel sounds are normal. She exhibits no distension. There is no tenderness. There is no guarding.  Neurological: She is alert and oriented to person, place, and time.  Skin: Skin is warm and dry.  Psychiatric: She has a normal mood and affect.   ED Treatments / Results  Labs (all labs ordered are listed, but only abnormal results are displayed) Labs Reviewed - No data to display  EKG  EKG Interpretation None       Radiology No results found.  Procedures Procedures (including critical care time)  DIAGNOSTIC STUDIES: Oxygen Saturation is 100% on RA, normal by my interpretation.    COORDINATION OF CARE: 9:00 PM Discussed treatment plan with pt at bedside and pt agreed to plan.  Medications Ordered in ED Medications - No data to display   Initial Impression / Assessment and Plan / ED Course  I have reviewed the triage vital signs and the nursing notes.  Pertinent  labs & imaging results that were available during my care of the patient were reviewed by me and considered in my medical decision making (see chart for details).     66 y.o. female presents with diarrhea for last 2 days. She has stopped drinking water because she feels it gives her diarrhea. She had multiple loose stools in office and was found to have slightly low BP and was sent for IVF. She is well appearing here and mildly clinically dehydrated but is not orthostatic, tachycardic or hypotensive. Likely viral enteritis or foodborne illness. Recommended aggressive bismuth salts and initiating fiber to slow progression and advised against dysmotility agents. Needs to orally rehydrate aggressively but do not feel IV resuscitation is necessary at this time. No pain or tenderness to suggest diverticulitis. Plan to follow up with PCP as needed and return precautions discussed for worsening or new concerning symptoms.   Final Clinical Impressions(s) / ED Diagnoses   Final diagnoses:  Diarrhea, unspecified type  Dehydration    New Prescriptions Discharge Medication List as of 07/29/2016  9:12 PM    START taking these medications   Details  polycarbophil (FIBERCON) 625 MG tablet Take 1 tablet (625 mg total) by mouth daily., Starting Mon 07/29/2016, Print       I personally performed the services described in this documentation, which was scribed in my presence. The recorded information has been reviewed and is accurate.      Leo Grosser, MD 07/30/16 [phone]

## 2016-07-29 NOTE — ED Triage Notes (Signed)
c/o n/d started yesterday-states she was sent from PCP "for fluids and a CT scan"-NAD-steady gait

## 2016-07-29 NOTE — Patient Instructions (Addendum)
  Go immediately to either [facility] or [facility] for further evaluation. I will contact them and let them know to expect you soon.   Thank you for letting me participate in your health and well being.    IF you received an x-ray today, you will receive an invoice from [facility]. Please contact [facility] Radiology at [phone] with questions or concerns regarding your invoice.   IF you received labwork today, you will receive an invoice from [facility]. Please contact [facility] at [phone] with questions or concerns regarding your invoice.   Our billing staff will not be able to assist you with questions regarding bills from these companies.  You will be contacted with the lab results as soon as they are available. The fastest way to get your results is to activate your My Chart account. Instructions are located on the last page of this paperwork. If you have not heard from [place] regarding the results in 2 weeks, please contact this office.

## 2016-07-30 LAB — CMP14+EGFR
ALBUMIN: 4.4 g/dL (ref 3.6–4.8)
ALT: 29 IU/L (ref 0–32)
AST: 36 IU/L (ref 0–40)
Albumin/Globulin Ratio: 2.1 (ref 1.2–2.2)
Alkaline Phosphatase: 91 IU/L (ref 39–117)
BUN / CREAT RATIO: 22 (ref 12–28)
BUN: 17 mg/dL (ref 8–27)
Bilirubin Total: 0.9 mg/dL (ref 0.0–1.2)
CALCIUM: 8.3 mg/dL — AB (ref 8.7–10.3)
CO2: 22 mmol/L (ref 18–29)
CREATININE: 0.77 mg/dL (ref 0.57–1.00)
Chloride: 98 mmol/L (ref 96–106)
GFR, EST AFRICAN AMERICAN: 94 mL/min/{1.73_m2} (ref >59)
GFR, EST NON AFRICAN AMERICAN: 82 mL/min/{1.73_m2} (ref >59)
Globulin, Total: 2.1 g/dL (ref 1.5–4.5)
Glucose: 93 mg/dL (ref 65–99)
Potassium: 4.1 mmol/L (ref 3.5–5.2)
Sodium: 137 mmol/L (ref 134–144)
TOTAL PROTEIN: 6.5 g/dL (ref 6.0–8.5)

## 2016-08-01 ENCOUNTER — Other Ambulatory Visit: Payer: Self-pay | Admitting: Diagnostic Neuroimaging

## 2016-08-01 ENCOUNTER — Encounter: Payer: Self-pay | Admitting: Diagnostic Neuroimaging

## 2016-08-01 MED ORDER — AMPHETAMINE-DEXTROAMPHET ER 20 MG PO CP24: 20.0000 mg | ORAL_CAPSULE | Freq: Every day | ORAL | 0 refills | Status: DC

## 2016-08-01 MED FILL — ONDANSETRON ODT 4 MG TABLET: 4 | 20 days supply | Qty: 60 | Fill #2

## 2016-08-01 NOTE — Telephone Encounter (Signed)
I have tried 3 times to print out adderall prescription.  This is with assistance of IT working on printer.  I am sending this to you to print and sign then will call pt to pick up.

## 2016-08-02 NOTE — Telephone Encounter (Signed)
[facility] mobile, prescription ready for pickup at front.  Close today at 1200.

## 2016-08-05 MED FILL — ADDERALL XR 20 MG CAP SA: 20 | 30 days supply | Qty: 30 | Fill #0

## 2016-08-06 ENCOUNTER — Encounter: Payer: Self-pay | Admitting: Physician Assistant

## 2016-08-14 ENCOUNTER — Other Ambulatory Visit: Payer: 59 | Admitting: Radiology

## 2016-08-14 DIAGNOSIS — R350 Frequency of micturition: Secondary | ICD-10-CM | POA: Diagnosis not present

## 2016-08-14 IMAGING — CT CT ABD-PELV W/ CM
2 of 5 series · 16 of 46 positions shown, 18 images · IV contrast (APPLIED)
Comparison: 10/21/2006

CLINICAL DATA: LEFT lower quadrant pain since yesterday, some RIGHT
lower quadrant pain, fever, question diverticulitis or atypical
appendicitis; past history of hysterectomy, multiple sclerosis,
former smoker

EXAM:
CT ABDOMEN AND PELVIS WITH CONTRAST
TECHNIQUE: Multidetector CT imaging of the abdomen and pelvis was performed
using the standard protocol following bolus administration of
intravenous contrast. Sagittal and coronal MPR images reconstructed
from axial data set.
CONTRAST:  100mL TOYD4L-VYY IOPAMIDOL (TOYD4L-VYY) INJECTION 61% IV.
Dilute oral contrast.

[Series 2: axial st · axial · 0.86mm/px · z∈[-489,-59]mm · 13 of 98 slices shown, 15 images]
[im 6/98  soft-tissue]
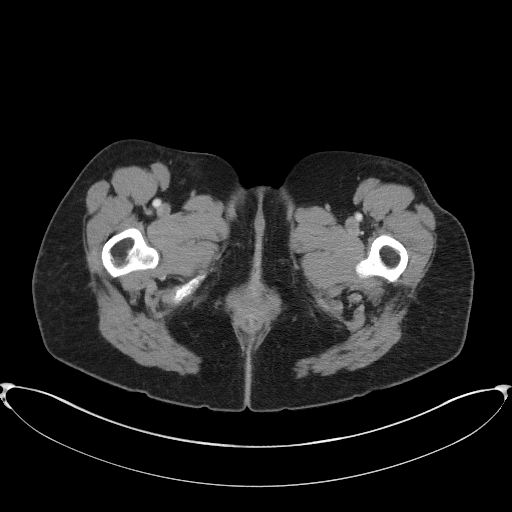
[im 6/98  bone]
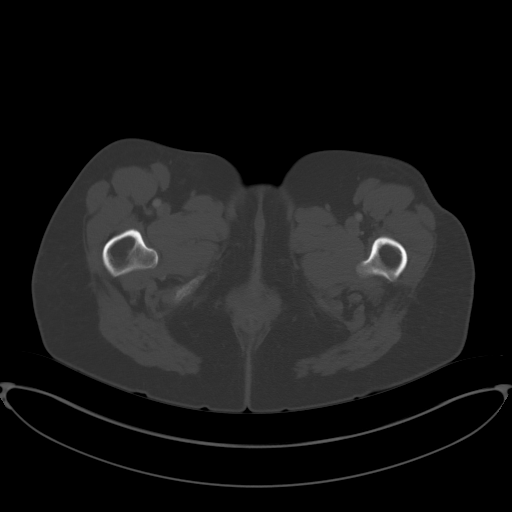
[im 16/98  soft-tissue]
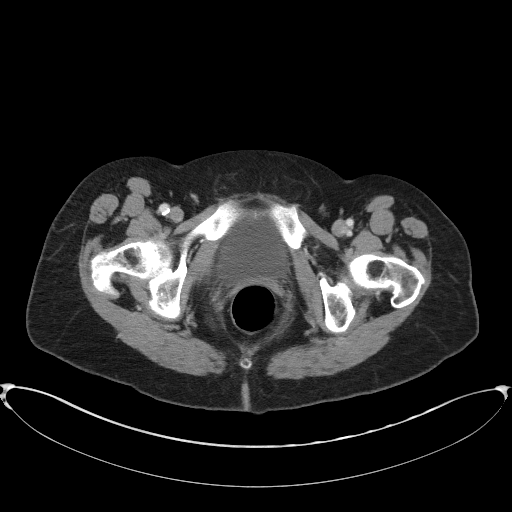
[im 21/98  soft-tissue]
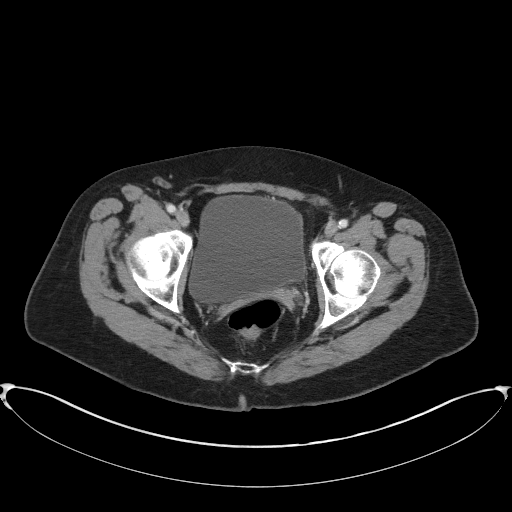
[im 26/98  soft-tissue]
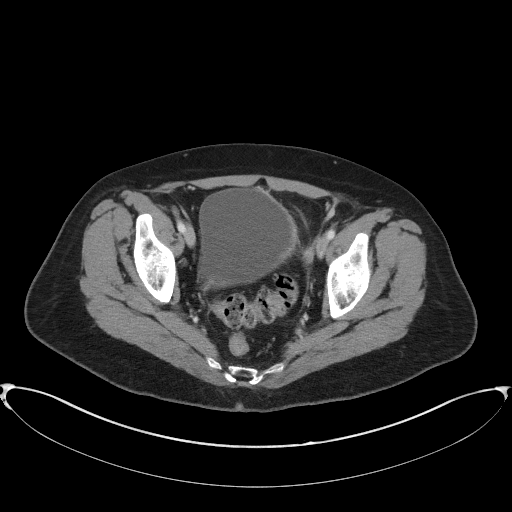
[im 36/98  soft-tissue]
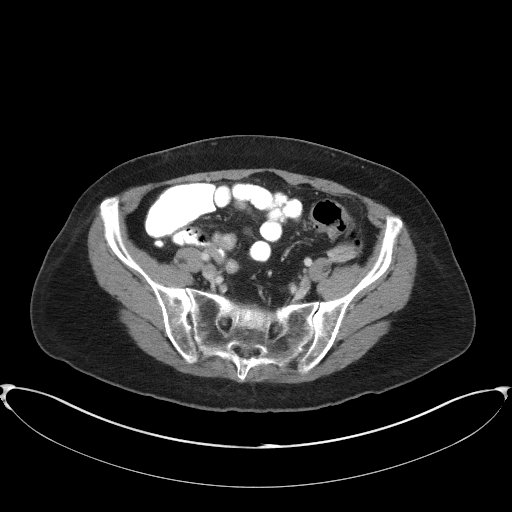
[im 41/98  soft-tissue]
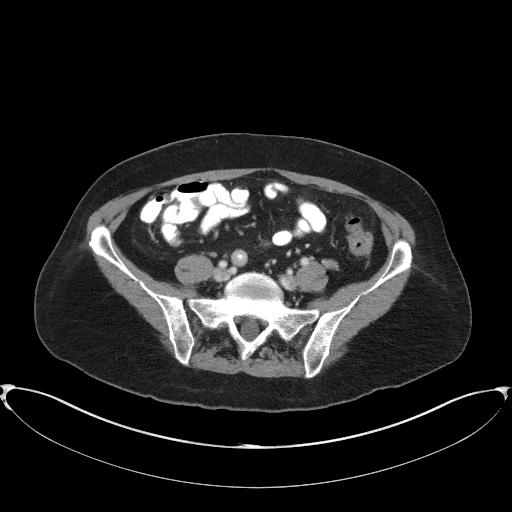
[im 52/98  soft-tissue]
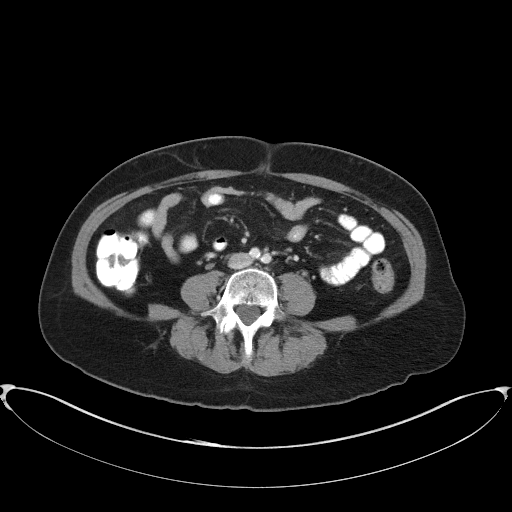
[im 57/98  soft-tissue]
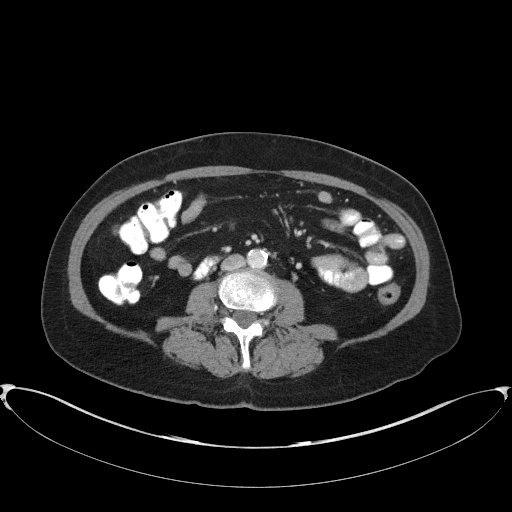
[im 62/98  soft-tissue]
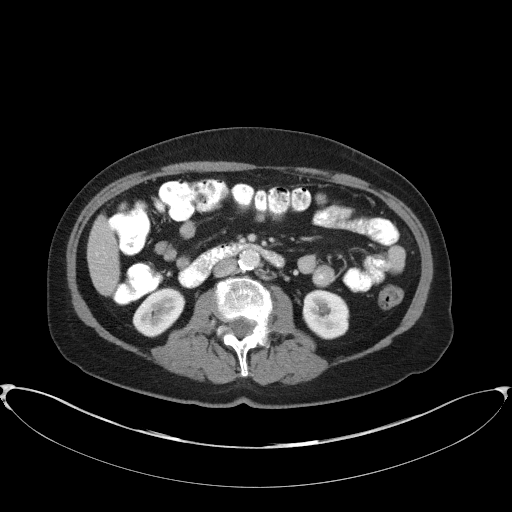
[im 62/98  bone]
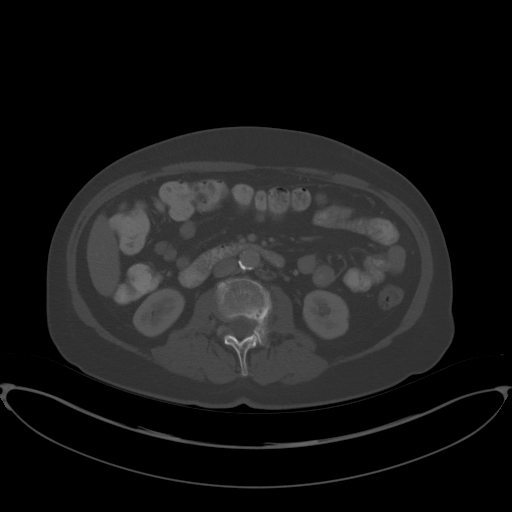
[im 72/98  soft-tissue]
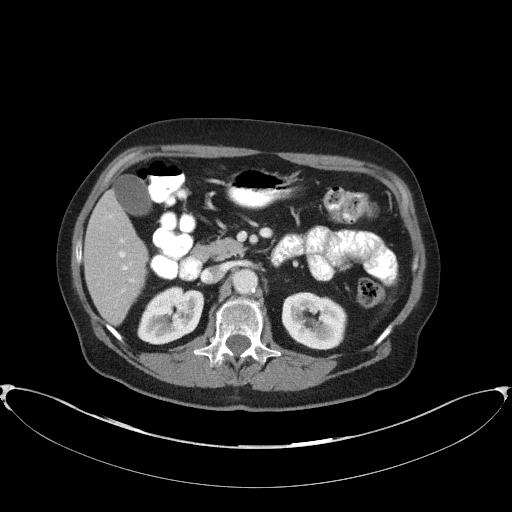
[im 77/98  soft-tissue]
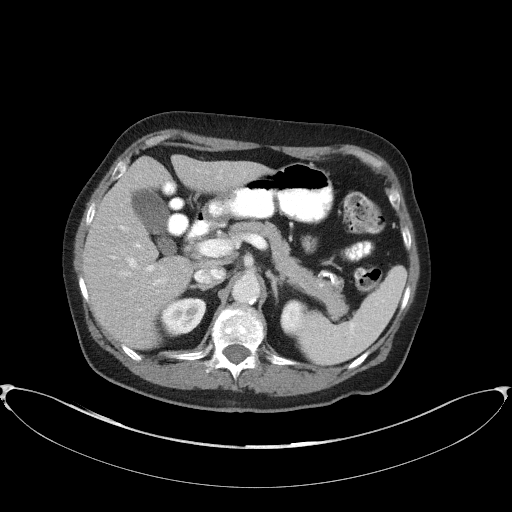
[im 82/98  soft-tissue]
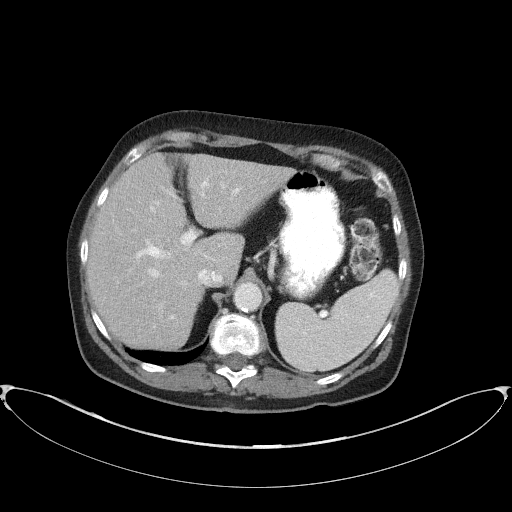
[im 92/98  soft-tissue]
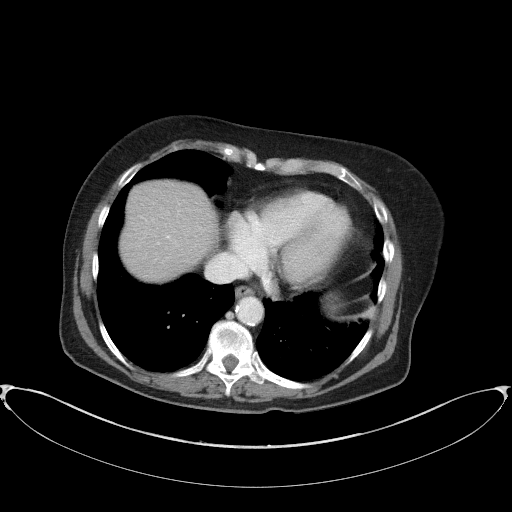

[Series 5: coronal st · coronal · 0.75mm/px · 3 of 76 slices shown]
[im 26/76  soft-tissue]
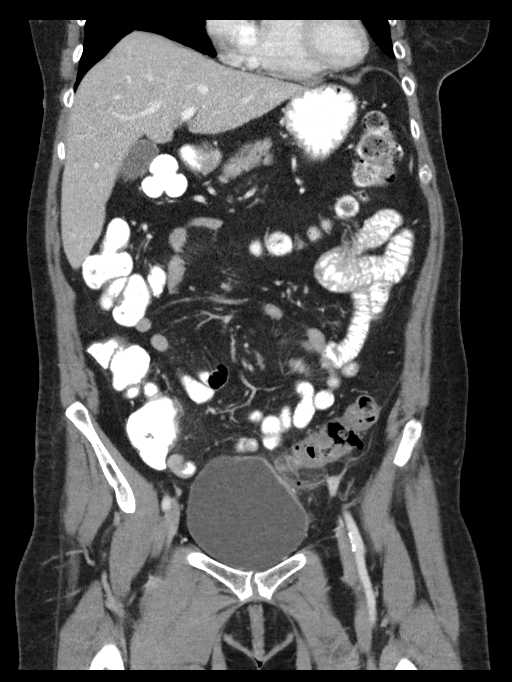
[im 34/76  soft-tissue]
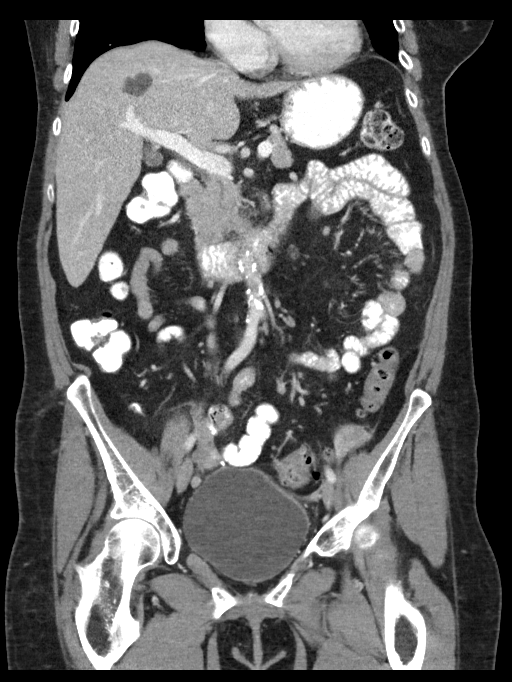
[im 42/76  soft-tissue]
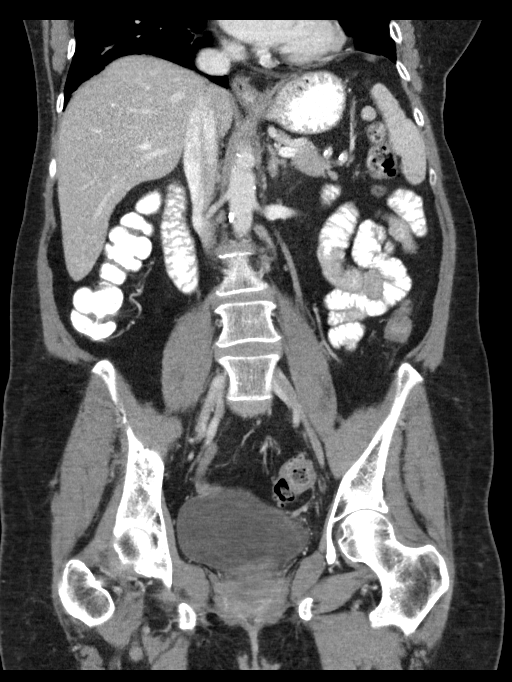

[16 of 46 positions shown; findings below may reference images not displayed]

FINDINGS: Bibasilar atelectasis.

Multiloculated lesion superiorly RIGHT lobe liver 2.6 x 2.0 cm image
11 previously 2.5 x 2.0 cm.

Liver, gallbladder, spleen, pancreas, kidneys, and adrenal glands
otherwise normal.

Normal appendix and ovaries with surgical absence of uterus.

Diverticulosis of descending and sigmoid colon with wall thickening
and pericolic inflammatory changes at proximal sigmoid colon
consistent with acute diverticulitis.

Infiltration of sigmoid mesocolon with several small foci of
extraluminal gas.

No discrete well-formed abscess collection is identified.

Associated wall thickening and enhancement along LEFT lateral wall
of urinary bladder.

Ureters unremarkable.

Stomach and remaining bowel loops normal appearance.

No mass, adenopathy, free air or free fluid.

No acute osseous findings.
IMPRESSION: Acute sigmoid diverticulitis with infiltrative changes and
extraluminal gas within sigmoid mesocolon.

No discrete/defined abscess collection or free intraperitoneal
air/fluid identified.

Reactive wall thickening and enhancement of the adjacent LEFT
lateral bladder wall.

Stable multiloculated cystic lesion within liver.

Findings called to Dr. Ellen Irene On 09/13/2015 at 7344 hours.

## 2016-08-14 NOTE — Progress Notes (Signed)
Patient came in for lab only to collect urine culture. This was ordered by Tenna Delaine, I have put in the order, and double checked with Ivar Drape PA this is for Urine Culture only, no other testing is needed.

## 2016-08-14 NOTE — Addendum Note (Signed)
Addended byCandice Camp on: 08/14/2016 05:30 PM   Modules accepted: Orders

## 2016-08-16 ENCOUNTER — Encounter: Payer: Self-pay | Admitting: Physician Assistant

## 2016-08-16 LAB — URINE CULTURE

## 2016-08-17 MED ORDER — CIPROFLOXACIN HCL 250 MG PO TABS
250.0000 mg | ORAL_TABLET | Freq: Two times a day (BID) | ORAL | 0 refills | Status: AC
Start: 2016-08-17 — End: 2016-08-22

## 2016-08-17 NOTE — Telephone Encounter (Signed)
Meds ordered this encounter  Medications  . ciprofloxacin (CIPRO) 250 MG tablet    Sig: Take 1 tablet (250 mg total) by mouth 2 (two) times daily.    Dispense:  10 tablet    Refill:  0    Order Specific Question:   Supervising Provider    Answer:   Shawnee Knapp [phone]    Patient notified via My Chart.

## 2016-08-20 ENCOUNTER — Other Ambulatory Visit: Payer: Self-pay | Admitting: Physician Assistant

## 2016-08-21 ENCOUNTER — Encounter: Payer: Self-pay | Admitting: Physician Assistant

## 2016-09-09 ENCOUNTER — Encounter: Payer: Self-pay | Admitting: Diagnostic Neuroimaging

## 2016-09-11 ENCOUNTER — Other Ambulatory Visit: Payer: Self-pay | Admitting: Physician Assistant

## 2016-09-11 DIAGNOSIS — R110 Nausea: Secondary | ICD-10-CM

## 2016-09-11 MED ORDER — AMPHETAMINE-DEXTROAMPHET ER 20 MG PO CP24: 20.0000 mg | ORAL_CAPSULE | Freq: Every day | ORAL | 0 refills | Status: DC

## 2016-09-11 MED FILL — OMEPRAZOLE 20 MG CAPSULE DR: 20 | 90 days supply | Qty: 90 | Fill #1

## 2016-09-11 MED FILL — ONDANSETRON ODT 4 MG TABLET: 4 | 20 days supply | Qty: 60 | Fill #0

## 2016-09-11 MED FILL — CITALOPRAM HBR 20 MG TABLET: 20 | 90 days supply | Qty: 90 | Fill #1

## 2016-09-12 MED FILL — ADDERALL XR 20 MG CAP SA: 20 | 30 days supply | Qty: 30 | Fill #0

## 2016-10-16 ENCOUNTER — Encounter: Payer: Self-pay | Admitting: Diagnostic Neuroimaging

## 2016-10-16 ENCOUNTER — Other Ambulatory Visit: Payer: Self-pay | Admitting: *Deleted

## 2016-10-16 MED ORDER — AMPHETAMINE-DEXTROAMPHET ER 20 MG PO CP24: 20.0000 mg | ORAL_CAPSULE | Freq: Every day | ORAL | 0 refills | Status: DC

## 2016-10-16 NOTE — Telephone Encounter (Signed)
Received My Chart request for refill of adderall XR. Will have work in dr sign; Dr Leta Baptist is out of the office. Notified patient by My Chart that her Rx should be ready for pick up by late this afternoon.

## 2016-10-17 MED FILL — ADDERALL XR 20 MG CAP SA: 20 | 30 days supply | Qty: 30 | Fill #0

## 2016-10-22 DIAGNOSIS — M47816 Spondylosis without myelopathy or radiculopathy, lumbar region: Secondary | ICD-10-CM | POA: Insufficient documentation

## 2016-10-22 NOTE — Progress Notes (Signed)
Office Visit Note  Patient: Katie Maldonado             Date of Birth: 19-Feb-1951           MRN: [id number]             PCP: Leonie Douglas, PA-C Referring: Leonie Douglas, PA* Visit Date: 10/23/2016 Occupation: @GUAROCC @    Subjective:  Left hip pain   History of Present Illness: Katie Maldonado is a 66 y.o. female with history of osteoarthritis and disc disease. She states she had done really well after her last cortisone injection to left trochanteric area in November 2017 . She states now the pain has recurred in the last 3 weeks. The pain is in the left trochanteric area and radiates down to her left lower extremity. She's been also having some discomfort in her C-spine. She's not having much discomfort in her hands, knee joints in her feet.  Activities of Daily Living:  Patient reports morning stiffness for 1 hour.   Patient Reports nocturnal pain.  Difficulty dressing/grooming: Denies Difficulty climbing stairs: Reports Difficulty getting out of chair: Denies Difficulty using hands for taps, buttons, cutlery, and/or writing: Denies   Review of Systems  Constitutional: Positive for fatigue. Negative for night sweats, weight gain, weight loss and weakness.  HENT: Negative for mouth sores, trouble swallowing, trouble swallowing, mouth dryness and nose dryness.   Eyes: Negative for pain, redness, visual disturbance and dryness.  Respiratory: Negative for cough, shortness of breath and difficulty breathing.   Cardiovascular: Negative for chest pain, palpitations, hypertension, irregular heartbeat and swelling in legs/feet.  Gastrointestinal: Positive for constipation and diarrhea. Negative for blood in stool.       History of IBS  Endocrine: Negative for increased urination.  Genitourinary: Negative for vaginal dryness.  Musculoskeletal: Positive for arthralgias, joint pain and morning stiffness. Negative for joint swelling, myalgias, muscle weakness,  muscle tenderness and myalgias.  Skin: Negative for color change, rash, hair loss, skin tightness, ulcers and sensitivity to sunlight.  Allergic/Immunologic: Negative for susceptible to infections.  Neurological: Negative for dizziness, memory loss and night sweats.  Hematological: Negative for swollen glands.  Psychiatric/Behavioral: Positive for sleep disturbance. Negative for depressed mood. The patient is nervous/anxious.     PMFS History:  Patient Active Problem List   Diagnosis Date Noted  . Osteoarthritis of lumbar spine 10/22/2016  . Rib contusion, left, initial encounter 07/15/2016  . Rib pain on left side 07/15/2016  . Accidental fall 07/15/2016  . History of multiple sclerosis 06/20/2016  . History of anxiety 06/20/2016  . Trochanteric bursitis of left hip 03/12/2016  . Primary osteoarthritis of both feet 03/09/2016  . Primary osteoarthritis of both knees 03/09/2016  . Degenerative cervical disc 10/04/2014  . Primary osteoarthritis of both hands 08/19/2014  . Low back pain 11/26/2013  . Numbness and tingling in left arm 07/09/2013  . Anxiety state, unspecified 12/30/2012  . Other fatigue 12/30/2012  . Osteoporosis 11/13/2012  . Incontinence of urine 04/18/2011  . HYPERLIPIDEMIA-MIXED 01/08/2010  . MULTIPLE SCLEROSIS 01/08/2010    Past Medical History:  Diagnosis Date  . Allergy   . Anxiety   . Arthritis   . Diverticulitis   . Diverticulosis 10/2015  . Hypersomnolence   . Migraine   . Multiple sclerosis ([place])   . Neuromuscular disorder ([place])   . Obesity, unspecified   . Osteoporosis   . Other and unspecified hyperlipidemia   . TIA (transient ischemic attack) 20  Family History  Problem Relation Age of Onset  . Emphysema Father   . Cancer Brother        bone  . Heart disease Brother   . Cancer Maternal Grandmother   . Heart attack Brother   . Cancer Maternal Grandfather   . Cancer Paternal Grandmother   . Cancer Paternal Grandfather    Past  Surgical History:  Procedure Laterality Date  . ABDOMINAL HYSTERECTOMY     Fibroids  . arm surgery left    . TUBAL LIGATION    . WRIST SURGERY     Social History   Social History Narrative   Patient is married [name]), has 2 children   Patient is right handed   Education level is 2 yrs of college   Caffeine consumption is 3-4 cups daily      Exercises at least 2-3 times a week. Walks about 5 miles a week.      Objective: Vital Signs: BP 112/68   Pulse 76   Resp 14   Ht 5' 4.5" (1.638 m)   Wt 160 lb (72.6 kg)   BMI 27.04 kg/m    Physical Exam  Constitutional: She is oriented to person, place, and time. She appears well-developed and well-nourished.  HENT:  Head: Normocephalic and atraumatic.  Eyes: Conjunctivae and EOM are normal.  Neck: Normal range of motion.  Cardiovascular: Normal rate, regular rhythm, normal heart sounds and intact distal pulses.   Pulmonary/Chest: Effort normal and breath sounds normal.  Abdominal: Soft. Bowel sounds are normal.  Lymphadenopathy:    She has no cervical adenopathy.  Neurological: She is alert and oriented to person, place, and time.  Skin: Skin is warm and dry. Capillary refill takes less than 2 seconds.  Psychiatric: She has a normal mood and affect. Her behavior is normal.  Nursing note and vitals reviewed.    Musculoskeletal Exam: C-spine limited range of motion thoracic and lumbar spine fairly good range of motion. Shoulder joints although joints wrist joints MCPs with good range of motion. She had DIP PIP thickening in her hands consistent with osteoarthritis. She is good range of motion of her hip joints knee joints ankles MTPs PIPs DIPs. She has tenderness on palpation of her left trochanteric bursa consistent with trochanteric bursitis. She had some dorsal spurring on her feet and PIP DIP prominence consistent with osteoarthritis.  CDAI Exam: No CDAI exam completed.    Investigation: No additional  findings.   Imaging: No results found.  Speciality Comments: No specialty comments available.    Procedures:  Large Joint Inj Date/Time: 10/23/2016 1:39 PM Performed by: Bo Merino Authorized by: Bo Merino   Consent Given by:  Patient Site marked: the procedure site was marked   Timeout: prior to procedure the correct patient, procedure, and site was verified   Indications:  Pain Location:  Hip Site:  L greater trochanter Prep: patient was prepped and draped in usual sterile fashion   Needle Size:  27 G Needle Length:  1.5 inches Approach:  Lateral Ultrasound Guidance: No   Fluoroscopic Guidance: No   Arthrogram: No   Medications:  40 mg triamcinolone acetonide 40 MG/ML; 1.5 mL lidocaine 1 % Aspiration Attempted: No   Aspirate amount (mL):  0 Patient tolerance:  Patient tolerated the procedure well with no immediate complications   Allergies: Nitrofurantoin; Sulfonamide derivatives; Cefdinir; and Copaxone [glatiramer acetate]   Assessment / Plan:     Visit Diagnoses: Trochanteric bursitis of left hip - Last cortisone injection  was in 11/17. She has recurrence of pain now. Different treatment options and their side effects were discussed. She wanted to proceed with another cortisone injection. The trochanteric area was injected with cortisone the procedures described above. ID pain exercises were demonstrated.  Primary osteoarthritis of both hands - +RF, -CCP, +ACE. She had no synovitis on examination  Primary osteoarthritis of both knees: Doing fairly well  Primary osteoarthritis of both feet: She's been using proper fitting shoes.  Degenerative cervical disc - Severe disease. She has limited range of motion but not much discomfort.  DDD Lumbar spine with scoliosis - Pt. was referred to PT in 11/17.  Age-related osteoporosis without current pathological fracture - T score -3.7, December 2016 , on Fosamax 70 mg po q week  MULTIPLE SCLEROSIS     Orders: Orders Placed This Encounter  Procedures  . Large Joint Injection/Arthrocentesis   No orders of the defined types were placed in this encounter.   Face-to-face time spent with patient was 30 minutes. 50% of time was spent in counseling and coordination of care.  Follow-Up Instructions: Return in about 6 months (around 04/24/2017) for Osteoarthritis.   Bo Merino, MD  Note - This record has been created using Editor, commissioning.  Chart creation errors have been sought, but may not always  have been located. Such creation errors do not reflect on  the standard of medical care.

## 2016-10-22 NOTE — Progress Notes (Deleted)
   Procedure Note  Patient: Katie Maldonado             Date of Birth: 06-10-1950           MRN: [id number]             Visit Date: 10/23/2016  Procedures: Visit Diagnoses: Trochanteric bursitis of left hip - Last cortisone injection was in 11/17.  Primary osteoarthritis of both hands - +RF, -CCP, +ACE  Primary osteoarthritis of both knees  Primary osteoarthritis of both feet  Degenerative cervical disc - Severe disease  DDD Lumbar spine with scoliosis - Pt. was referred to PT in 11/17.  Age-related osteoporosis without current pathological fracture - T score -3.7, December 2016 , on Fosamax 70 mg po q week  MULTIPLE SCLEROSIS  No procedures performed

## 2016-10-23 ENCOUNTER — Ambulatory Visit (INDEPENDENT_AMBULATORY_CARE_PROVIDER_SITE_OTHER): Payer: 59 | Admitting: Rheumatology

## 2016-10-23 ENCOUNTER — Encounter: Payer: Self-pay | Admitting: Rheumatology

## 2016-10-23 VITALS — BP 112/68 | HR 76 | Resp 14 | Ht 64.5 in | Wt 160.0 lb

## 2016-10-23 DIAGNOSIS — M5030 Other cervical disc degeneration, unspecified cervical region: Secondary | ICD-10-CM | POA: Diagnosis not present

## 2016-10-23 DIAGNOSIS — M19072 Primary osteoarthritis, left ankle and foot: Secondary | ICD-10-CM

## 2016-10-23 DIAGNOSIS — M7062 Trochanteric bursitis, left hip: Secondary | ICD-10-CM | POA: Diagnosis not present

## 2016-10-23 DIAGNOSIS — M19071 Primary osteoarthritis, right ankle and foot: Secondary | ICD-10-CM | POA: Diagnosis not present

## 2016-10-23 DIAGNOSIS — G35 Multiple sclerosis: Secondary | ICD-10-CM | POA: Diagnosis not present

## 2016-10-23 DIAGNOSIS — M170 Bilateral primary osteoarthritis of knee: Secondary | ICD-10-CM | POA: Diagnosis not present

## 2016-10-23 DIAGNOSIS — M47816 Spondylosis without myelopathy or radiculopathy, lumbar region: Secondary | ICD-10-CM

## 2016-10-23 DIAGNOSIS — M19041 Primary osteoarthritis, right hand: Secondary | ICD-10-CM

## 2016-10-23 DIAGNOSIS — M19042 Primary osteoarthritis, left hand: Secondary | ICD-10-CM

## 2016-10-23 DIAGNOSIS — M810 Age-related osteoporosis without current pathological fracture: Secondary | ICD-10-CM

## 2016-10-23 MED ORDER — LIDOCAINE HCL 1 % IJ SOLN
1.5000 mL | INTRAMUSCULAR | Status: AC | PRN
  Administered 2016-10-23: 1.5 mL

## 2016-10-23 MED ORDER — TRIAMCINOLONE ACETONIDE 40 MG/ML IJ SUSP
40.0000 mg | INTRAMUSCULAR | Status: AC | PRN
  Administered 2016-10-23: 40 mg via INTRA_ARTICULAR

## 2016-10-23 NOTE — Patient Instructions (Signed)
Iliotibial Bursitis Rehab Ask your health care provider which exercises are safe for you. Do exercises exactly as told by your health care provider and adjust them as directed. It is normal to feel mild stretching, pulling, tightness, or discomfort as you do these exercises, but you should stop right away if you feel sudden pain or your pain gets worse.Do not begin these exercises until told by your health care provider. Stretching and range of motion exercises These exercises warm up your muscles and joints and improve the movement and flexibility of your leg. These exercises also help to relieve pain and stiffness. Exercise A: Quadriceps stretch, prone  1. Lie on your abdomen on a firm surface, such as a bed or padded floor. 2. Bend your left / right knee and hold your ankle. If you cannot reach your ankle or pant leg, loop a belt around your foot and grab the belt instead. 3. Gently pull your heel toward your buttocks. Your knee should not slide out to the side. You should feel a stretch in the front of your thigh and knee. 4. Hold this position for __________ seconds. Repeat __________ times. Complete this exercise __________ times a day. Exercise B: Lunge ( adductor stretch) 1. Stand and spread your legs about 3 feet (about 1 m) apart. Put your left / right leg slightly back for balance. 2. Lean away from your left / right leg by bending your other knee and shifting your weight toward your bent knee. You may rest your hands on your thigh for balance. You should feel a stretch in your left / right inner thigh. 3. Hold for __________ seconds. Repeat __________ times. Complete this exercise __________ times a day. Exercise C: Hamstring stretch, supine  1. Lie on your back. 2. Hold both ends of a belt or towel as you loop it over the ball of your left / right foot. The ball of your foot is on the walking surface, right under your toes. 3. Straighten your left / right knee and slowly pull on  the belt to raise your leg. Stop when you feel a gentle stretch in the back of your left / right knee or thigh. ? Do not let your left / right knee bend. ? Keep your other leg flat on the floor. 4. Hold this position for __________ seconds. Repeat __________ times. Complete this exercise __________ times a day. Strengthening exercises These exercises build strength and endurance in your leg. Endurance is the ability to use your muscles for a long time, even after they get tired. Exercise D: Quadriceps wall slides  1. Lean your back against a smooth wall or door while you walk your feet out 18-24 inches (46-61 cm) from it. 2. Place your feet hip-width apart. 3. Slowly slide down the wall or door until your knees bend as far as told by your health care provider. Keep your knees over your heels, not your toes. Keep your knees in line with your hips. 4. Hold for __________ seconds. 5. Push through your heels to stand up to rest for __________ seconds after each repetition. Repeat __________ times. Complete this exercise __________ times a day. Exercise E: Straight leg raises ( hip abductors) 1. Lie on your side, with your left / right leg in the top position. Lie so your head, shoulder, knee, and hip line up with each other. You may bend your bottom knee to help you balance. 2. Lift your top leg 4-6 inches (10-15 cm) while keeping your   toes pointed straight ahead. 3. Hold this position for __________ seconds. 4. Slowly lower your leg to the starting position. Allow your muscles to relax completely after each repetition. Repeat __________ times. Complete this exercise __________ times a day. Exercise F: Straight leg raises ( hip extensors) 1. Lie on your abdomen on a firm surface. You can put a pillow under your hips if that is more comfortable. 2. Tense the muscles in your buttocks and lift your left / right leg about 4-6 inches (10-15 cm). Keep your knee straight as you lift your leg. 3. Hold  this position for __________ seconds. 4. Slowly lower your leg to the starting position. 5. Let your leg relax completely after each repetition. Repeat __________ times. Complete this exercise __________ times a day. Exercise G: Bridge ( hip extensors) 1. Lie on your back on a firm surface with your knees bent and your feet flat on the floor. 2. Tighten your buttocks muscles and lift your bottom off the floor until your trunk is level with your thighs. ? Do not arch your back. ? You should feel the muscles working in your buttocks and the back of your thighs. If you do not feel these muscles, slide your feet 1-2 inches (2.5-5 cm) farther away from your buttocks. 3. Hold this position for __________ seconds. 4. Slowly lower your hips to the starting position. 5. Let your buttocks muscles relax completely between repetitions. 6. If this exercise is too easy, try doing it with your arms crossed over your chest. Repeat __________ times. Complete this exercise __________ times a day. This information is not intended to replace advice given to you by your health care provider. Make sure you discuss any questions you have with your health care provider. Document Released: 04/15/2005 Document Revised: 12/21/2015 Document Reviewed: 03/28/2015 Elsevier Interactive Patient Education  2018 [employer]  

## 2016-10-31 ENCOUNTER — Telehealth: Payer: Self-pay | Admitting: *Deleted

## 2016-10-31 NOTE — Telephone Encounter (Signed)
No note.  Chart phone note tab opened in error.

## 2016-11-18 ENCOUNTER — Encounter: Payer: Self-pay | Admitting: Diagnostic Neuroimaging

## 2016-11-18 ENCOUNTER — Other Ambulatory Visit: Payer: Self-pay | Admitting: *Deleted

## 2016-11-18 MED ORDER — AMPHETAMINE-DEXTROAMPHET ER 20 MG PO CP24: 20.0000 mg | ORAL_CAPSULE | Freq: Every day | ORAL | 0 refills | Status: DC

## 2016-11-20 MED FILL — ADDERALL XR 20 MG CAP SA: 20 | 30 days supply | Qty: 30 | Fill #0

## 2016-11-25 ENCOUNTER — Encounter: Payer: Self-pay | Admitting: Rheumatology

## 2016-11-25 NOTE — Telephone Encounter (Signed)
The coccyx injury can last for a long time. I would recommend appointment with ortho if she has continued symptoms.

## 2016-11-28 DIAGNOSIS — K5904 Chronic idiopathic constipation: Secondary | ICD-10-CM | POA: Diagnosis not present

## 2016-11-28 DIAGNOSIS — K5730 Diverticulosis of large intestine without perforation or abscess without bleeding: Secondary | ICD-10-CM | POA: Diagnosis not present

## 2016-11-28 DIAGNOSIS — K219 Gastro-esophageal reflux disease without esophagitis: Secondary | ICD-10-CM | POA: Diagnosis not present

## 2016-11-28 DIAGNOSIS — Z1211 Encounter for screening for malignant neoplasm of colon: Secondary | ICD-10-CM | POA: Diagnosis not present

## 2016-12-16 ENCOUNTER — Ambulatory Visit (INDEPENDENT_AMBULATORY_CARE_PROVIDER_SITE_OTHER): Payer: 59 | Admitting: Diagnostic Neuroimaging

## 2016-12-16 ENCOUNTER — Encounter: Payer: Self-pay | Admitting: Diagnostic Neuroimaging

## 2016-12-16 VITALS — BP 117/73 | HR 84 | Wt 156.2 lb

## 2016-12-16 DIAGNOSIS — R5383 Other fatigue: Secondary | ICD-10-CM

## 2016-12-16 DIAGNOSIS — G35 Multiple sclerosis: Secondary | ICD-10-CM | POA: Diagnosis not present

## 2016-12-16 DIAGNOSIS — R413 Other amnesia: Secondary | ICD-10-CM

## 2016-12-16 DIAGNOSIS — R41840 Attention and concentration deficit: Secondary | ICD-10-CM

## 2016-12-16 MED ORDER — AMPHETAMINE-DEXTROAMPHET ER 20 MG PO CP24: 20.0000 mg | ORAL_CAPSULE | Freq: Every day | ORAL | 0 refills | Status: DC

## 2016-12-16 NOTE — Progress Notes (Signed)
PATIENT: Katie Maldonado DOB: 08-09-1950   REASON FOR VISIT: follow up for MS HISTORY FROM: patient  Chief Complaint  Patient presents with  . Multiple Sclerosis    rm 6, "3 falls in past 2 months- unsure why she fell; still falls asleep at work; continuing back/neck pain"  . Follow-up    6 month     HISTORY OF PRESENT ILLNESS:  UPDATE 12/16/16: Since last visit, doing about the same. Still with fatigue. Tolerating tecfidera. Denies depression. Sleep is stable.   UPDATE 06/18/16: Since last visit, doing better. Better energy and concentration. PT helping with back pain.   UPDATE 03/06/16: Since last visit, continues with fatigue, poor attention, difficulty at work. She is in corrective action due to errors. Denies depression, but has excessive daytime fatigue.   UPDATE 08/31/15: Since last visit continues with fatigue, pain, numbness. Tolerating tecfidera. Has tried PT. Urinary issues better. Now recovering from right arm celluitis (was painting, developed blister, then got infected; now better on oral antibiotics).  UPDATE 07/07/15: Since last visit, still with left neck pain radiating to the left arm. Numb/tingling. Has h/o left forearm/wrist fracture (2005 and 2010; s/p surgery). Amantadine not helping with fatigue. Denies depression.   UPDATE 05/31/15: Since last visit, sxs progressing since Oct 2016. More fatigue, more bladder/bowel issues. More left arm numbness.  UPDATE 11/28/14: Since last visit, continues to have prickly, tingling sensation in whole body, hot flash sensations, pain, neck issues. Had 2 falls, but no injuries.  UPDATE 08/23/14: Since last visit, has been on tecfidera since beginning March 2016. She has noted more general numbness, fatigue, constipation, drawstring sensation in calves/hamstrings in past 3 weeks.   UPDATE 05/18/14: MS symptoms stable. Has had 2 reactions to copaxone, leading to tinging, prickly sensations over whole body. 1 event led to throat  tightness, tongue feeling thick, and worried patient. Here to discuss possibilities of switching therapies.  UPDATE 01/04/14 (VRP): Since last visit, doing the same. No new neuro symptoms. Does report persistent fatigue, weight gain, incr appetite, feeling hot. Tolerating copaxone. Misses 2-3 injections per month.   UPDATE 06/30/13 (LL):  Since last visit patient continues to have problems with fatigue and malaise, numbness and paresthesias in left arm which is new for her, started a couple weeks ago.  She states it is constant, does not hurt, but annoying.  She is still able to work.  Follow up MRI brain at last visit showed no acute or new lesions, with moderate cerebral atrophy.  UPDATE 12/30/12 (VP): Since last visit patient continues to have problems with fatigue, insomnia, anxiety, shortness of breath. For past 6 months she's been having dyspnea on exertion especially climbing steps at her work. She has been evaluated by PCP without specific diagnosis. Patient continues on Copaxone (now 3 times per week dosing). Last MRI brain was in 2011. Regarding fatigue she has been on Provigil and Ritalin in the past without relief. Regarding anxiety she was on Paxil in the past but this caused nausea and stomach problems.   PRIOR HPI (05/02/12, Dr. 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 Her MRI of brain 06/10/1999 showed white matter abnormalities not typical for MS and MRI of the cervical spine without contrast 07/27/99 showed a focal area of abnormality in the spinal cord at C2-3. She had positive CSF IgG index and oligoclonal banding. Initially the VER was normal with subsequent  VERs 12/04/1999, 12/10/1999, and 02/01/2000 abnormal in the right eye. She began Avonex which she took for one year and switched to Rebif beginning 08/2000. She was then switched to Betaseron 04/2001 because her insurance would  not pay for Rebif, but was subsequently switched back to Rebif 04/2003. Because of elevated liver function tests she was switched to Copaxone 07/03/2005.She has felt better on Copaxone then on the interferons. Her visual acuity varies from 20/30 to 20/40 in the left eye and to 20/200 in the right. She had burning paresthesias treated with gabapentin and at times amitriptyline. NMO blood test was negative 02/21/2005. She has been followed Dr. Estanislado Pandy, rheumatologist for the question of RA versus sarcoidosis and by Dr.Clinton Young for hypersomnolence without a diagnosis of narcolepsy or obstructive Sleep apnea. MRI studies of the brain and cervical spine 02/08/2010 showed periventricular lesions and unchanged lesion at C2-3. CBC and CMP 03/17/2009 were normal. CBC, CMP, TSH, lipid profile except LDL 151 were normal 08/07/09.vitamin D level 10/16/2009 was 41. She complains of fatigue and her right foot turning in an occasional foot cramping. She fell 10/2009 fracturing her right foot in a [employer] parking lot. In 2010 she fractured her left foot, left wrist, and her left arm. She has continued swelling in her right foot and lower leg. She has an overactive bladder followed by Dr.Dalhstedt. She has a history of Lhermitte's sign worse with neck extension but also present with flexion. She had numbness on the right side of her body except her face and head for one month.Her bladder symptoms improved on enablex.She walks 1.8 miles 3 times per week. 03/05/2011 she was lying in bed asleep and awoke trying to sit up and developed dizziness on 2 different occasions. She fell back in to the bed and went to sleep. On the third episode she became concerned. The episodes of dizziness would last seconds and are described as spinning. She had dizziness during the day without spinning She did not have head trauma or a cold. She was seen by Dr. Elder Cyphers at urgent care and underwent blood studies, EKG, and urinalysis. She has a history of  migraine and a history of motion sickness. She has a Lhermitte's sign when she turns her head to the left with discomfort in her neck going into her left arm. This can occur 2 times per day and lasts Less than 1 minute. She has bladder symptoms and right flank pain and is concerned about another urinary tract infection. She can awaken with snoring when she has a cold. She sleeps well. She has dizziness 2 or 3 times per week when she lies down and turns her head to the left.    REVIEW OF SYSTEMS: Full 14 system review of systems performed and negative except: dizziness numbness back pain fatigue double vision itching freq urination.   ALLERGIES: Allergies  Allergen Reactions  . Nitrofurantoin Shortness Of Breath    Shortness of breath, violent chills, dizziness, nausea  . Sulfonamide Derivatives Shortness Of Breath    Shortness of breath, chills  . Cefdinir     Can't remember what rx had 2 yrs ago  . Copaxone [Glatiramer Acetate]     HOME MEDICATIONS: Outpatient Medications Prior to Visit  Medication Sig Dispense Refill  . alendronate (FOSAMAX) 70 MG tablet Take 1 tablet (70 mg total) by mouth once a week. Take with a full glass of water on an empty stomach. 12 tablet 3  . amphetamine-dextroamphetamine (ADDERALL XR) 20 MG 24 hr capsule Take  1 capsule (20 mg total) by mouth daily. 30 capsule 0  . aspirin EC 81 MG tablet Take 1 tablet (81 mg total) by mouth daily.    . Biotin 10 MG TABS Take by mouth.    . cholecalciferol (VITAMIN D) 1000 UNITS tablet Take 1,000 Units by mouth daily. Reported on 08/08/2015    . citalopram (CELEXA) 20 MG tablet Take 1 tablet (20 mg total) by mouth daily. 90 tablet 1  . Dimethyl Fumarate (TECFIDERA) 240 MG CPDR Take 240 mg by mouth 2 (two) times daily.    Marland Kitchen ibuprofen (ADVIL,MOTRIN) 200 MG tablet Take 2 tablets (400 mg total) by mouth 4 (four) times daily.    . Omega-3 Fatty Acids (FISH OIL) 1000 MG CAPS Take 1,000 mg by mouth 2 (two) times daily.   0  .  omeprazole (PRILOSEC) 20 MG capsule Take 1 capsule (20 mg total) by mouth daily. 90 capsule 1  . ondansetron (ZOFRAN-ODT) 4 MG disintegrating tablet DISSOLVE 1 TABLET BY MOUTH EVERY 8 HOURS AS NEEDED FOR NAUSEA AND VOMITING 60 tablet 0  . TURMERIC PO Take 1 tablet by mouth daily.    Marland Kitchen HYDROcodone-acetaminophen (NORCO) 5-325 MG tablet Take 1 tablet by mouth every 6 (six) hours as needed for moderate pain. (Patient not taking: Reported on 07/29/2016) 15 tablet 0  . mirabegron ER (MYRBETRIQ) 50 MG TB24 tablet Take 50 mg by mouth daily.    Marland Kitchen bismuth subsalicylate (PEPTO-BISMOL) 262 MG/15ML suspension Take 30 mLs by mouth every 4 (four) hours as needed for diarrhea or loose stools. 360 mL 0   Facility-Administered Medications Prior to Visit  Medication Dose Route Frequency Provider Last Rate Last Dose  . gadopentetate dimeglumine (MAGNEVIST) injection 15 mL  15 mL Intravenous Once PRN Penumalli, Vikram R, MD      . gadopentetate dimeglumine (MAGNEVIST) injection 16 mL  16 mL Intravenous Once PRN Penumalli, Vikram R, MD       PHYSICAL EXAM  Vitals:   12/16/16 1604  BP: 117/73  Pulse: 84  Weight: 156 lb 3.2 oz (70.9 kg)   Body mass index is 26.4 kg/m.  GENERAL EXAM/CONSTITUTIONAL: Vitals:  Vitals:   12/16/16 1604  BP: 117/73  Pulse: 84  Weight: 156 lb 3.2 oz (70.9 kg)    Patient is in no distress; well developed, nourished and groomed; neck is supple  CARDIOVASCULAR:  Examination of carotid arteries is normal; no carotid bruits  Regular rate and rhythm, no murmurs  Examination of peripheral vascular system by observation and palpation is normal  EYES:  Ophthalmoscopic exam of optic discs and posterior segments is normal; no papilledema or hemorrhages  MUSCULOSKELETAL:  Gait, strength, tone, movements noted in Neurologic exam below  NEUROLOGIC: MENTAL STATUS:   awake, alert, oriented to person, place and time  recent and remote memory intact  normal attention and  concentration  language fluent, comprehension intact, naming intact,   fund of knowledge appropriate  CRANIAL NERVE:   2nd - no papilledema on fundoscopic exam  2nd, 3rd, 4th, 6th - pupils equal and reactive to light, visual fields full to confrontation, extraocular muscles intact, no nystagmus  5th - facial sensation symmetric  7th - facial strength symmetric  8th - hearing intact  9th - palate elevates symmetrically, uvula midline  11th - shoulder shrug symmetric  12th - tongue protrusion midline  MOTOR:   normal bulk and tone, full strength in the BUE, BLE   SENSORY:   normal and symmetric to light touch, temperature,  vibration  COORDINATION:   finger-nose-finger, fine finger movements normal  REFLEXES:   deep tendon reflexes present and symmetric  GAIT/STATION:   narrow based gait    DIAGNOSTIC DATA (LABS, IMAGING, TESTING) - I reviewed patient records, labs, notes, testing and imaging myself where available.  Lab Results  Component Value Date   WBC 3.3 (A) 07/29/2016   HGB 12.3 07/29/2016   HCT 36.6 (A) 07/29/2016   MCV 85.6 07/29/2016   PLT 257 06/05/2016   Lymphocytes Absolute  Date Value Ref Range Status  06/05/2016 0.7 0.7 - 3.1 x10E3/uL Final  12/05/2015 0.8 0.7 - 3.1 x10E3/uL Final  08/31/2015 0.6 (L) 0.7 - 3.1 x10E3/uL Final   Lymphs Abs  Date Value Ref Range Status  03/12/2016 559 (L) 850 - 3,900 cells/uL Final   CMP Latest Ref Rng & Units 07/29/2016 06/05/2016 03/12/2016  Glucose 65 - 99 mg/dL 93 80 108(H)  BUN 8 - 27 mg/dL 17 9 12   Creatinine 0.57 - 1.00 mg/dL 0.77 0.73 0.75  Sodium 134 - 144 mmol/L 137 141 139  Potassium 3.5 - 5.2 mmol/L 4.1 5.1 4.6  Chloride 96 - 106 mmol/L 98 100 103  CO2 18 - 29 mmol/L 22 23 28   Calcium 8.7 - 10.3 mg/dL 8.3(L) 9.5 9.2  Total Protein 6.0 - 8.5 g/dL 6.5 7.0 6.9  Total Bilirubin 0.0 - 1.2 mg/dL 0.9 0.6 0.4  Alkaline Phos 39 - 117 IU/L 91 78 70  AST 0 - 40 IU/L 36 23 20  ALT 0 - 32 IU/L 29 21  16    Lab Results  Component Value Date   TSH 2.290 06/05/2016    08/31/14 MRI brain - showing T2/flair hyperintense foci in the periventricular, deep and subcortical matter in a pattern and configuration consistent with the diagnosis of multiple sclerosis. No acute foci are noted. Generalized cortical atrophy is noted. When compared to a study dated 05/26/2014, there is no interval change.  08/31/14 MRI cervical spine -  1. Severe degenerative changes at C4-C5, C5-C6 and C6-C7 with there is disc protrusion, uncovertebral spurring and facet hypertrophy. There is mild spinal stenosis at each of these 3 levels. There is also moderately severe foraminal narrowing which could lead to impingement of the exiting C5, C6 and C7 nerve roots. 2. Degenerative changes are milder at C2-C3, C3-C4 and C7-T1 and are less likely to lead to nerve root impingement.  3. The spinal cord appears normal.  4. There are no enhancing abnormalities.  5. Compared to the MRI of the cervical spine dated 07/07/2013, there is no definite interval change.   06/14/15 MRI brain  1. Mild periventricular and subcortical and infratentorial chronic demyelinating plaques.  2. No abnormal lesions are seen on post contrast views.  3. No change from MRI on 08/31/14.   06/28/15 MRI cervical  - severe degenerative changes at C4-5, C5-6 and C6-7 resulting in broad-based disc protrusions, uncovertebral spurring and facet hypertrophy resulting in mild canal and moderate bilateral foraminal narrowing with likely encroachment on the exiting C5, C6 and C7 nerve roots. No enhancing or demyelinating lesions are noted. Overall no significant change compared with previous MRI scan dated 08/31/2014.  08/31/15 anti-JCV antibody - 1.71 high/positive    ASSESSMENT AND PLAN  66 y.o. female here with multiple sclerosis since 2001, on copaxone since 2007. Previously on avonex and betaseron.  Then apparent intolerance to copaxone with 2 events of  tingling, throat tightness, thick tongue with 2 injections. Now on tecfidera since March 2016. Some  progression of symptoms since Oct 2016, but MRI scans are stable.  Left neck and arm symptoms likely related to cervical radiculopathies. Will try conservative treatment options.  Tried gabapentin for numbness --> stopped due to sleepiness.  Tried ritalin, provigil, amantadine for fatigue --> stopped due to in-effectiveness.    Dx:  Multiple sclerosis ([place])  Other fatigue  Attention and concentration deficit     PLAN:  MULTIPLE SCLEROSIS - continue tecfidera - check MRI brain; may consider switch to ocrevus if there is significant progression on scan - continue gentle yoga classes and PT exercises - continue adderall for fatigue and poor attention  Meds ordered this encounter  Medications  . amphetamine-dextroamphetamine (ADDERALL XR) 20 MG 24 hr capsule    Sig: Take 1 capsule (20 mg total) by mouth daily.    Dispense:  30 capsule    Refill:  0   Orders Placed This Encounter  Procedures  . MR BRAIN W WO CONTRAST   Return in about 6 months (around 06/18/2017) for with [place] or Penumalli.    Penni Bombard, MD 9/79/8921, 1:94 PM Certified in Neurology, Neurophysiology and Neuroimaging  [facility] [address], [place], [place] [address] [phone]

## 2016-12-17 ENCOUNTER — Telehealth: Payer: Self-pay | Admitting: Diagnostic Neuroimaging

## 2016-12-17 MED FILL — ADDERALL XR 20 MG CAP SA: 20 | 30 days supply | Qty: 30 | Fill #0

## 2016-12-17 NOTE — Telephone Encounter (Signed)
I called the patient back and she is scheduled to have her MRI at [facility] mobile unit on 01/01/17.

## 2016-12-17 NOTE — Telephone Encounter (Signed)
Pt said that she is returning the call to [facility], please call.

## 2016-12-19 ENCOUNTER — Telehealth: Payer: Self-pay | Admitting: *Deleted

## 2016-12-19 NOTE — Telephone Encounter (Signed)
Received [employer] FMLA papers.

## 2016-12-19 NOTE — Telephone Encounter (Signed)
[employer] FMLA papers completed and signed. Sen to medical records for processing.

## 2016-12-23 ENCOUNTER — Other Ambulatory Visit: Payer: Self-pay | Admitting: Physician Assistant

## 2016-12-23 DIAGNOSIS — K219 Gastro-esophageal reflux disease without esophagitis: Secondary | ICD-10-CM

## 2016-12-23 DIAGNOSIS — F419 Anxiety disorder, unspecified: Secondary | ICD-10-CM

## 2016-12-24 MED FILL — OMEPRAZOLE 20 MG CAP: 20 | 30 days supply | Qty: 30 | Fill #0

## 2016-12-24 MED FILL — CITALOPRAM HBR 20 MG TABLET: 20 | 30 days supply | Qty: 30 | Fill #0

## 2016-12-25 ENCOUNTER — Telehealth: Payer: 59 | Admitting: Nurse Practitioner

## 2016-12-25 DIAGNOSIS — N3000 Acute cystitis without hematuria: Secondary | ICD-10-CM

## 2016-12-25 MED ORDER — CIPROFLOXACIN HCL 500 MG PO TABS: 500.0000 mg | ORAL_TABLET | Freq: Two times a day (BID) | ORAL | 0 refills | Status: DC

## 2016-12-25 MED FILL — CIPROFLOXACIN HCL 500 MG TA: 500 | 5 days supply | Qty: 10 | Fill #0

## 2016-12-25 NOTE — Progress Notes (Signed)

## 2017-01-01 ENCOUNTER — Ambulatory Visit (INDEPENDENT_AMBULATORY_CARE_PROVIDER_SITE_OTHER): Payer: 59

## 2017-01-01 DIAGNOSIS — R413 Other amnesia: Secondary | ICD-10-CM

## 2017-01-01 MED ORDER — GADOPENTETATE DIMEGLUMINE 469.01 MG/ML IV SOLN: 15.0000 mL | Freq: Once | INTRAVENOUS | Status: DC | PRN

## 2017-01-09 ENCOUNTER — Encounter: Payer: Self-pay | Admitting: Physician Assistant

## 2017-01-09 ENCOUNTER — Telehealth: Payer: Self-pay | Admitting: *Deleted

## 2017-01-09 NOTE — Telephone Encounter (Signed)
LMVM for pt on her mobile, MRI stable, no new finding.  Continue current plan.  Call back if questions.

## 2017-01-09 NOTE — Telephone Encounter (Signed)
-----   Message from Penni Bombard, MD sent at 01/08/2017  9:03 PM EDT ----- Stable MRI study. No new findings. Please call patient. Continue current plan. -VRP

## 2017-01-14 MED FILL — GAVILYTE-G SOLUTION: 236 | 1 days supply | Qty: 4000 | Fill #0

## 2017-01-16 ENCOUNTER — Encounter: Payer: Self-pay | Admitting: Diagnostic Neuroimaging

## 2017-01-16 ENCOUNTER — Telehealth: Payer: Self-pay | Admitting: *Deleted

## 2017-01-16 MED ORDER — AMPHETAMINE-DEXTROAMPHET ER 20 MG PO CP24: 20.0000 mg | ORAL_CAPSULE | Freq: Every day | ORAL | 0 refills | Status: DC

## 2017-01-16 NOTE — Telephone Encounter (Signed)
adderall refilled. -VRP

## 2017-01-16 NOTE — Telephone Encounter (Signed)
Adderall XR refill Rx ready for pick up. Patient notified by my chart message.

## 2017-01-17 MED FILL — ADDERALL XR 20 MG CAP SA: 20 | 30 days supply | Qty: 30 | Fill #0

## 2017-01-21 ENCOUNTER — Encounter: Payer: Self-pay | Admitting: Physician Assistant

## 2017-01-21 ENCOUNTER — Ambulatory Visit (INDEPENDENT_AMBULATORY_CARE_PROVIDER_SITE_OTHER): Payer: 59 | Admitting: Physician Assistant

## 2017-01-21 DIAGNOSIS — K219 Gastro-esophageal reflux disease without esophagitis: Secondary | ICD-10-CM

## 2017-01-21 DIAGNOSIS — F419 Anxiety disorder, unspecified: Secondary | ICD-10-CM | POA: Diagnosis not present

## 2017-01-21 MED ORDER — OMEPRAZOLE 20 MG PO CPDR: 20.0000 mg | DELAYED_RELEASE_CAPSULE | Freq: Every day | ORAL | 3 refills | Status: DC

## 2017-01-21 MED ORDER — CITALOPRAM HYDROBROMIDE 20 MG PO TABS: 20.0000 mg | ORAL_TABLET | Freq: Every day | ORAL | 3 refills | Status: DC

## 2017-01-21 MED FILL — OMEPRAZOLE 20 MG CAP: 20 | 90 days supply | Qty: 90 | Fill #0

## 2017-01-21 MED FILL — CITALOPRAM HBR 20 MG TABLET: 20 | 90 days supply | Qty: 90 | Fill #0

## 2017-01-21 NOTE — Progress Notes (Signed)
Katie Maldonado  MRN: [id number] DOB: [birth year]  Subjective:  Katie Maldonado is a 66 y.o. female seen in office today for a chief complaint of medication refill.  Anxiety: Patient has had a dx of anxiety for a number of years. She is currently controlled on celexa 20mg  daily. She feels as if her anxiety is currently well controlled. She is not currently seeing a therapist. Denies nausea, vomiting, diarrhea, headache, dysphoric mood and suicidal ideations.   GERD: Pt has been taking prilosec 20mg  daily for the past 1.5 years. Notes it controls her heartburn and epigastric pain very well. Can tell a difference if she does not take it. Magnesium checked in 05/2016 and was normal. She has never had an endoscopy. She has a follow up with GI next week for colonoscopy.  Review of Systems  Constitutional: Negative for chills, diaphoresis and fever.  Cardiovascular: Negative for chest pain and palpitations.    Patient Active Problem List   Diagnosis Date Noted  . Osteoarthritis of lumbar spine 10/22/2016  . Rib contusion, left, initial encounter 07/15/2016  . Rib pain on left side 07/15/2016  . Accidental fall 07/15/2016  . History of multiple sclerosis 06/20/2016  . History of anxiety 06/20/2016  . Trochanteric bursitis of left hip 03/12/2016  . Primary osteoarthritis of both feet 03/09/2016  . Primary osteoarthritis of both knees 03/09/2016  . Degenerative cervical disc 10/04/2014  . Primary osteoarthritis of both hands 08/19/2014  . Low back pain 11/26/2013  . Numbness and tingling in left arm 07/09/2013  . Anxiety state, unspecified 12/30/2012  . Other fatigue 12/30/2012  . Osteoporosis 11/13/2012  . Incontinence of urine 04/18/2011  . HYPERLIPIDEMIA-MIXED 01/08/2010  . MULTIPLE SCLEROSIS 01/08/2010    Current Outpatient Prescriptions on File Prior to Visit  Medication Sig Dispense Refill  . alendronate (FOSAMAX) 70 MG tablet Take 1 tablet (70 mg total) by mouth  once a week. Take with a full glass of water on an empty stomach. 12 tablet 3  . amphetamine-dextroamphetamine (ADDERALL XR) 20 MG 24 hr capsule Take 1 capsule (20 mg total) by mouth daily. 30 capsule 0  . aspirin EC 81 MG tablet Take 1 tablet (81 mg total) by mouth daily.    . Biotin 10 MG TABS Take by mouth.    . cholecalciferol (VITAMIN D) 1000 UNITS tablet Take 1,000 Units by mouth daily. Reported on 08/08/2015    . ciprofloxacin (CIPRO) 500 MG tablet Take 1 tablet (500 mg total) by mouth 2 (two) times daily. 10 tablet 0  . citalopram (CELEXA) 20 MG tablet Take 1 tablet (20 mg total) by mouth daily. Office visit needed for 90 day supply 30 tablet 0  . Dimethyl Fumarate (TECFIDERA) 240 MG CPDR Take 240 mg by mouth 2 (two) times daily.    Marland Kitchen HYDROcodone-acetaminophen (NORCO) 5-325 MG tablet Take 1 tablet by mouth every 6 (six) hours as needed for moderate pain. (Patient not taking: Reported on 07/29/2016) 15 tablet 0  . ibuprofen (ADVIL,MOTRIN) 200 MG tablet Take 2 tablets (400 mg total) by mouth 4 (four) times daily.    . mirabegron ER (MYRBETRIQ) 50 MG TB24 tablet Take 50 mg by mouth daily.    . Omega-3 Fatty Acids (FISH OIL) 1000 MG CAPS Take 1,000 mg by mouth 2 (two) times daily.   0  . omeprazole (PRILOSEC) 20 MG capsule Take 1 capsule (20 mg total) by mouth daily. Office visit needed for 90 day supply 30 capsule 0  . ondansetron (  ZOFRAN-ODT) 4 MG disintegrating tablet DISSOLVE 1 TABLET BY MOUTH EVERY 8 HOURS AS NEEDED FOR NAUSEA AND VOMITING 60 tablet 0  . TURMERIC PO Take 1 tablet by mouth daily.     Current Facility-Administered Medications on File Prior to Visit  Medication Dose Route Frequency Provider Last Rate Last Dose  . gadopentetate dimeglumine (MAGNEVIST) injection 15 mL  15 mL Intravenous Once PRN Penumalli, Vikram R, MD      . gadopentetate dimeglumine (MAGNEVIST) injection 15 mL  15 mL Intravenous Once PRN Penumalli, Vikram R, MD      . gadopentetate dimeglumine (MAGNEVIST)  injection 16 mL  16 mL Intravenous Once PRN Penumalli, Earlean Polka, MD        Allergies  Allergen Reactions  . Nitrofurantoin Shortness Of Breath    Shortness of breath, violent chills, dizziness, nausea  . Sulfonamide Derivatives Shortness Of Breath    Shortness of breath, chills  . Cefdinir     Can't remember what rx had 2 yrs ago  . Copaxone [Glatiramer Acetate]      Objective:  BP 114/64 (BP Location: Right Arm, Patient Position: Sitting, Cuff Size: Normal)   Pulse 77   Temp 98.5 F (36.9 C) (Oral)   Resp 18   Ht 5' 3.78" (1.62 m)   Wt 159 lb 6.4 oz (72.3 kg)   SpO2 (!) 9%   BMI 27.55 kg/m   Physical Exam  Constitutional: She is oriented to person, place, and time and well-developed, well-nourished, and in no distress.  HENT:  Head: Normocephalic and atraumatic.  Eyes: Conjunctivae are normal.  Neck: Normal range of motion.  Pulmonary/Chest: Effort normal.  Neurological: She is alert and oriented to person, place, and time. Gait normal.  Skin: Skin is warm and dry.  Psychiatric: Affect normal.  Vitals reviewed.    GAD 7 : Generalized Anxiety Score 01/21/2017  Nervous, Anxious, on Edge 2  Control/stop worrying 1  Worry too much - different things 2  Trouble relaxing 0  Restless 1  Easily annoyed or irritable 2  Afraid - awful might happen 1  Total GAD 7 Score 9  Anxiety Difficulty Not difficult at all       Assessment and Plan :  1. Anxiety Well controlled at this time. Continue current dose of celexa. Return in 6 months for CPE.  - citalopram (CELEXA) 20 MG tablet; Take 1 tablet (20 mg total) by mouth daily.  Dispense: 90 tablet; Refill: 3  2. Gastroesophageal reflux disease, esophagitis presence not specified Controlled. Pt encouraged to discuss GERD management with GI next week to see if they recommend any further evaluation or if they recommend she just continue current long term use of PPI for management.  - omeprazole (PRILOSEC) 20 MG capsule; Take 1  capsule (20 mg total) by mouth daily.  Dispense: 90 capsule; Refill: [place] PA-C  [facility] 01/21/2017 5:48 PM

## 2017-01-21 NOTE — Patient Instructions (Addendum)
I have given you refills for one year. Please talk with your GI doctor about daily prilosec use. Otherwise, follow up when you are in need of your annual physical exam. Thank you for letting me participate in your health and well being.    IF you received an x-ray today, you will receive an invoice from [facility]. Please contact [facility] at [phone] with questions or concerns regarding your invoice.   IF you received labwork today, you will receive an invoice from [facility]. Please contact [facility] at [phone] with questions or concerns regarding your invoice.   Our billing staff will not be able to assist you with questions regarding bills from these companies.  You will be contacted with the lab results as soon as they are available. The fastest way to get your results is to activate your My Chart account. Instructions are located on the last page of this paperwork. If you have not heard from [place] regarding the results in 2 weeks, please contact this office.

## 2017-01-24 DIAGNOSIS — Z1211 Encounter for screening for malignant neoplasm of colon: Secondary | ICD-10-CM | POA: Diagnosis not present

## 2017-01-24 DIAGNOSIS — K5730 Diverticulosis of large intestine without perforation or abscess without bleeding: Secondary | ICD-10-CM | POA: Diagnosis not present

## 2017-01-24 DIAGNOSIS — K635 Polyp of colon: Secondary | ICD-10-CM | POA: Diagnosis not present

## 2017-01-24 DIAGNOSIS — D125 Benign neoplasm of sigmoid colon: Secondary | ICD-10-CM | POA: Diagnosis not present

## 2017-01-24 DIAGNOSIS — D122 Benign neoplasm of ascending colon: Secondary | ICD-10-CM | POA: Diagnosis not present

## 2017-01-24 LAB — HM COLONOSCOPY

## 2017-02-21 ENCOUNTER — Encounter: Payer: Self-pay | Admitting: Diagnostic Neuroimaging

## 2017-02-21 MED ORDER — AMPHETAMINE-DEXTROAMPHET ER 20 MG PO CP24: 20.0000 mg | ORAL_CAPSULE | Freq: Every day | ORAL | 0 refills | Status: DC

## 2017-02-24 MED FILL — ADDERALL XR 20 MG CAP SA: 20 | 30 days supply | Qty: 30 | Fill #0

## 2017-02-24 NOTE — Telephone Encounter (Signed)
Adderall refill Rx at front desk for pick up by patient.

## 2017-02-25 NOTE — Progress Notes (Signed)
Office Visit Note  Patient: Katie Maldonado             Date of Birth: 1950/05/08           MRN: [id number]             PCP: Leonie Douglas, PA-C Referring: Leonie Douglas, PA* Visit Date: 02/28/2017 Occupation: @GUAROCC @    Subjective:  Arthritis (doing fair, right shoulder pain )   History of Present Illness: Katie Maldonado is a 66 y.o. female with history of osteoarthritis. She states that she had colonoscopy about 5 weeks ago and after that she started hurting in her right shoulder. She states the pain has been progressively getting worse. She is having difficulty lifting her arm and sleeping on the right side. She does not recall any injury. She continues to have some stiffness and discomfort in her knee joints due to underlying osteoarthritis. Her hands and feet discomfort is tolerable. Her C-spine is still hurts due to underlying disc disease. She has some lower back pain off and on.  Activities of Daily Living:  Patient reports morning stiffness for 15 minutes.   Patient Reports nocturnal pain.  Difficulty dressing/grooming: Denies Difficulty climbing stairs: Denies Difficulty getting out of chair: Denies Difficulty using hands for taps, buttons, cutlery, and/or writing: Denies   Review of Systems  Constitutional: Positive for fatigue. Negative for night sweats, weight gain, weight loss and weakness.  HENT: Negative for mouth sores, trouble swallowing, trouble swallowing, mouth dryness and nose dryness.   Eyes: Negative for pain, redness, visual disturbance and dryness.  Respiratory: Negative for cough, shortness of breath and difficulty breathing.   Cardiovascular: Negative for chest pain, palpitations, hypertension, irregular heartbeat and swelling in legs/feet.  Gastrointestinal: Negative for blood in stool, constipation and diarrhea.  Endocrine: Negative for increased urination.  Genitourinary: Negative for vaginal dryness.  Musculoskeletal:  Positive for arthralgias, joint pain and morning stiffness. Negative for joint swelling, myalgias, muscle weakness, muscle tenderness and myalgias.  Skin: Negative for color change, rash, hair loss, skin tightness, ulcers and sensitivity to sunlight.  Allergic/Immunologic: Negative for susceptible to infections.  Neurological: Negative for dizziness, memory loss and night sweats.  Hematological: Negative for swollen glands.  Psychiatric/Behavioral: Positive for sleep disturbance. Negative for depressed mood. The patient is nervous/anxious.     PMFS History:  Patient Active Problem List   Diagnosis Date Noted  . Osteoarthritis of lumbar spine 10/22/2016  . Rib contusion, left, initial encounter 07/15/2016  . Rib pain on left side 07/15/2016  . Accidental fall 07/15/2016  . History of multiple sclerosis 06/20/2016  . History of anxiety 06/20/2016  . Trochanteric bursitis of left hip 03/12/2016  . Primary osteoarthritis of both feet 03/09/2016  . Primary osteoarthritis of both knees 03/09/2016  . Degenerative cervical disc 10/04/2014  . Primary osteoarthritis of both hands 08/19/2014  . Low back pain 11/26/2013  . Numbness and tingling in left arm 07/09/2013  . Anxiety state, unspecified 12/30/2012  . Other fatigue 12/30/2012  . Osteoporosis 11/13/2012  . Incontinence of urine 04/18/2011  . HYPERLIPIDEMIA-MIXED 01/08/2010  . MULTIPLE SCLEROSIS 01/08/2010    Past Medical History:  Diagnosis Date  . Allergy   . Anxiety   . Arthritis   . Diverticulitis   . Diverticulosis 10/2015  . Hypersomnolence   . Migraine   . Multiple sclerosis ([place])   . Neuromuscular disorder ([place])   . Obesity, unspecified   . Osteoporosis   . Other and unspecified hyperlipidemia   .  TIA (transient ischemic attack) 20    Family History  Problem Relation Age of Onset  . Emphysema Father   . Cancer Brother        bone  . Cancer Maternal Grandmother   . Heart attack Brother   . Cancer Maternal  Grandfather   . Cancer Paternal Grandmother   . Cancer Paternal Grandfather   . Irritable bowel syndrome Son    Past Surgical History:  Procedure Laterality Date  . ABDOMINAL HYSTERECTOMY     Fibroids  . arm surgery left    . TUBAL LIGATION    . WRIST SURGERY     Social History   Social History Narrative   Patient is married [name]), has 2 children   Patient is right handed   Education level is 2 yrs of college   Caffeine consumption is 3-4 cups daily      Exercises at least 2-3 times a week. Walks about 5 miles a week.      Objective: Vital Signs: BP 116/75 (BP Location: Left Arm, Patient Position: Sitting, Cuff Size: Normal)   Pulse 81   Resp 16   Ht 5\' 4"  (1.626 m)   Wt 162 lb (73.5 kg)   BMI 27.81 kg/m    Physical Exam  Constitutional: She is oriented to person, place, and time. She appears well-developed and well-nourished.  HENT:  Head: Normocephalic and atraumatic.  Eyes: Conjunctivae and EOM are normal.  Neck: Normal range of motion.  Cardiovascular: Normal rate, regular rhythm, normal heart sounds and intact distal pulses.   Pulmonary/Chest: Effort normal and breath sounds normal.  Abdominal: Soft. Bowel sounds are normal.  Lymphadenopathy:    She has no cervical adenopathy.  Neurological: She is alert and oriented to person, place, and time.  Skin: Skin is warm and dry. Capillary refill takes less than 2 seconds.  Psychiatric: She has a normal mood and affect. Her behavior is normal.  Nursing note and vitals reviewed.    Musculoskeletal Exam: C-spine some stiffness with range of motion. She has thoracolumbar scoliosis. She's painful range of motion of her right shoulder although it was full range of motion. Left shoulder was full range of motion without discomfort. Elbow joints wrist joints are good range of motion. She has some DIP PIP thickening in her hands consistent with osteoarthritis. She is good range of motion of her hip joints knee joints ankles  MTPs PIPs DIPs. Some crepitus in the bilateral knee joints was noted.  CDAI Exam: No CDAI exam completed.    Investigation: No additional findings.   Imaging: Xr Shoulder Right  Result Date: 02/28/2017 She was slightly high rising which could be positional. No acromioclavicular joint space narrowing was noted. No glenohumeral joint space narrowing was noted.   Speciality Comments: No specialty comments available.    Procedures:  Large Joint Inj Date/Time: 02/28/2017 10:03 AM Performed by: Bo Merino Authorized by: Bo Merino   Consent Given by:  Patient Site marked: the procedure site was marked   Timeout: prior to procedure the correct patient, procedure, and site was verified   Indications:  Pain Location:  Shoulder Site:  R glenohumeral Prep: patient was prepped and draped in usual sterile fashion   Needle Size:  27 G Needle Length:  1.5 inches Approach:  Posterior Ultrasound Guidance: No   Fluoroscopic Guidance: No   Arthrogram: No   Medications:  1 mL lidocaine 1 %; 40 mg triamcinolone acetonide 40 MG/ML Aspiration Attempted: Yes   Aspirate amount (  mL):  0 Patient tolerance:  Patient tolerated the procedure well with no immediate complications   Allergies: Nitrofurantoin; Sulfonamide derivatives; Betaseron [interferon beta-1b]; Cefdinir; and Copaxone [glatiramer acetate]   Assessment / Plan:     Visit Diagnoses: Chronic right shoulder pain -patient has been having discomfort in her right shoulder for the last 5 weeks. She complains of nocturnal pain and discomfort during routine activities. Plan: XR Shoulder Right: X-ray showed slightly high rising humerus . Different treatment options were discussed. After informed consent was obtained right shoulder joint was injected with cortisone as described above she tolerated the procedure well. I've given her a handout on shoulder joint exercises. If her symptoms persist she will notify us and we may consider  MRI to evaluate this further.  Primary osteoarthritis of both knees: Chronic discomfort  Primary osteoarthritis of both feet: Doing better with proper fitting shoes  Primary osteoarthritis of both hands: Joint protection and muscle strength in discussed.  DDD (degenerative disc disease), cervical: Chronic pain   Chronic bilateral low back pain without sciatica - Chronic pain she has DJD and scoliosis  Age-related osteoporosis without current pathological fracture - She is on Fosamax for 1 year now she will have bone density with PCP next year  History of vitamin D deficiency: She is on vitamin D.  History of multiple sclerosis - Followed by neurologist  History of fatigue  History of anxiety    Orders: Orders Placed This Encounter  Procedures  . Large Joint Injection/Arthrocentesis  . XR Shoulder Right   No orders of the defined types were placed in this encounter.   Face-to-face time spent with patient was 30 minutes. Greater than 50% of time was spent in counseling and coordination of care.  Follow-Up Instructions: Return in about 6 months (around 08/28/2017).   Bo Merino, MD  Note - This record has been created using Editor, commissioning.  Chart creation errors have been sought, but may not always  have been located. Such creation errors do not reflect on  the standard of medical care.

## 2017-02-28 ENCOUNTER — Encounter: Payer: Self-pay | Admitting: Rheumatology

## 2017-02-28 ENCOUNTER — Ambulatory Visit (INDEPENDENT_AMBULATORY_CARE_PROVIDER_SITE_OTHER): Payer: 59 | Admitting: Rheumatology

## 2017-02-28 ENCOUNTER — Telehealth: Payer: Self-pay | Admitting: Physician Assistant

## 2017-02-28 ENCOUNTER — Ambulatory Visit (INDEPENDENT_AMBULATORY_CARE_PROVIDER_SITE_OTHER): Payer: 59

## 2017-02-28 VITALS — BP 116/75 | HR 81 | Resp 16 | Ht 64.0 in | Wt 162.0 lb

## 2017-02-28 DIAGNOSIS — G8929 Other chronic pain: Secondary | ICD-10-CM

## 2017-02-28 DIAGNOSIS — Z8639 Personal history of other endocrine, nutritional and metabolic disease: Secondary | ICD-10-CM | POA: Diagnosis not present

## 2017-02-28 DIAGNOSIS — R110 Nausea: Secondary | ICD-10-CM

## 2017-02-28 DIAGNOSIS — M810 Age-related osteoporosis without current pathological fracture: Secondary | ICD-10-CM

## 2017-02-28 DIAGNOSIS — Z8669 Personal history of other diseases of the nervous system and sense organs: Secondary | ICD-10-CM

## 2017-02-28 DIAGNOSIS — M19071 Primary osteoarthritis, right ankle and foot: Secondary | ICD-10-CM | POA: Diagnosis not present

## 2017-02-28 DIAGNOSIS — Z8659 Personal history of other mental and behavioral disorders: Secondary | ICD-10-CM

## 2017-02-28 DIAGNOSIS — M5030 Other cervical disc degeneration, unspecified cervical region: Secondary | ICD-10-CM

## 2017-02-28 DIAGNOSIS — G35D Multiple sclerosis, unspecified: Secondary | ICD-10-CM

## 2017-02-28 DIAGNOSIS — M545 Low back pain, unspecified: Secondary | ICD-10-CM

## 2017-02-28 DIAGNOSIS — M170 Bilateral primary osteoarthritis of knee: Secondary | ICD-10-CM

## 2017-02-28 DIAGNOSIS — M19041 Primary osteoarthritis, right hand: Secondary | ICD-10-CM | POA: Diagnosis not present

## 2017-02-28 DIAGNOSIS — M25511 Pain in right shoulder: Secondary | ICD-10-CM

## 2017-02-28 DIAGNOSIS — G35 Multiple sclerosis: Secondary | ICD-10-CM

## 2017-02-28 DIAGNOSIS — M19072 Primary osteoarthritis, left ankle and foot: Secondary | ICD-10-CM

## 2017-02-28 DIAGNOSIS — Z87898 Personal history of other specified conditions: Secondary | ICD-10-CM

## 2017-02-28 DIAGNOSIS — M19042 Primary osteoarthritis, left hand: Secondary | ICD-10-CM

## 2017-02-28 MED ORDER — LIDOCAINE HCL 1 % IJ SOLN
1.0000 mL | INTRAMUSCULAR | Status: AC | PRN
  Administered 2017-02-28: 1 mL

## 2017-02-28 MED ORDER — TRIAMCINOLONE ACETONIDE 40 MG/ML IJ SUSP
40.0000 mg | INTRAMUSCULAR | Status: AC | PRN
  Administered 2017-02-28: 40 mg via INTRA_ARTICULAR

## 2017-02-28 NOTE — Patient Instructions (Signed)
Shoulder Exercises Ask your health care provider which exercises are safe for you. Do exercises exactly as told by your health care provider and adjust them as directed. It is normal to feel mild stretching, pulling, tightness, or discomfort as you do these exercises, but you should stop right away if you feel sudden pain or your pain gets worse.Do not begin these exercises until told by your health care provider. RANGE OF MOTION EXERCISES These exercises warm up your muscles and joints and improve the movement and flexibility of your shoulder. These exercises also help to relieve pain, numbness, and tingling. These exercises involve stretching your injured shoulder directly. Exercise A: Pendulum  1. Stand near a wall or a surface that you can hold onto for balance. 2. Bend at the waist and let your left / right arm hang straight down. Use your other arm to support you. Keep your back straight and do not lock your knees. 3. Relax your left / right arm and shoulder muscles, and move your hips and your trunk so your left / right arm swings freely. Your arm should swing because of the motion of your body, not because you are using your arm or shoulder muscles. 4. Keep moving your body so your arm swings in the following directions, as told by your health care provider: ? Side to side. ? Forward and backward. ? In clockwise and counterclockwise circles. 5. Continue each motion for __________ seconds, or for as long as told by your health care provider. 6. Slowly return to the starting position. Repeat __________ times. Complete this exercise __________ times a day. Exercise B:Flexion, Standing  1. Stand and hold a broomstick, a cane, or a similar object. Place your hands a little more than shoulder-width apart on the object. Your left / right hand should be palm-up, and your other hand should be palm-down. 2. Keep your elbow straight and keep your shoulder muscles relaxed. Push the stick down with  your healthy arm to raise your left / right arm in front of your body, and then over your head until you feel a stretch in your shoulder. ? Avoid shrugging your shoulder while you raise your arm. Keep your shoulder blade tucked down toward the middle of your back. 3. Hold for __________ seconds. 4. Slowly return to the starting position. Repeat __________ times. Complete this exercise __________ times a day. Exercise C: Abduction, Standing 1. Stand and hold a broomstick, a cane, or a similar object. Place your hands a little more than shoulder-width apart on the object. Your left / right hand should be palm-up, and your other hand should be palm-down. 2. While keeping your elbow straight and your shoulder muscles relaxed, push the stick across your body toward your left / right side. Raise your left / right arm to the side of your body and then over your head until you feel a stretch in your shoulder. ? Do not raise your arm above shoulder height, unless your health care provider tells you to do that. ? Avoid shrugging your shoulder while you raise your arm. Keep your shoulder blade tucked down toward the middle of your back. 3. Hold for __________ seconds. 4. Slowly return to the starting position. Repeat __________ times. Complete this exercise __________ times a day. Exercise D:Internal Rotation  1. Place your left / right hand behind your back, palm-up. 2. Use your other hand to dangle an exercise band, a towel, or a similar object over your shoulder. Grasp the band with   your left / right hand so you are holding onto both ends. 3. Gently pull up on the band until you feel a stretch in the front of your left / right shoulder. ? Avoid shrugging your shoulder while you raise your arm. Keep your shoulder blade tucked down toward the middle of your back. 4. Hold for __________ seconds. 5. Release the stretch by letting go of the band and lowering your hands. Repeat __________ times. Complete  this exercise __________ times a day. STRETCHING EXERCISES These exercises warm up your muscles and joints and improve the movement and flexibility of your shoulder. These exercises also help to relieve pain, numbness, and tingling. These exercises are done using your healthy shoulder to help stretch the muscles of your injured shoulder. Exercise E: Corner Stretch (External Rotation and Abduction)  1. Stand in a doorway with one of your feet slightly in front of the other. This is called a staggered stance. If you cannot reach your forearms to the door frame, stand facing a corner of a room. 2. Choose one of the following positions as told by your health care provider: ? Place your hands and forearms on the door frame above your head. ? Place your hands and forearms on the door frame at the height of your head. ? Place your hands on the door frame at the height of your elbows. 3. Slowly move your weight onto your front foot until you feel a stretch across your chest and in the front of your shoulders. Keep your head and chest upright and keep your abdominal muscles tight. 4. Hold for __________ seconds. 5. To release the stretch, shift your weight to your back foot. Repeat __________ times. Complete this stretch __________ times a day. Exercise F:Extension, Standing 1. Stand and hold a broomstick, a cane, or a similar object behind your back. ? Your hands should be a little wider than shoulder-width apart. ? Your palms should face away from your back. 2. Keeping your elbows straight and keeping your shoulder muscles relaxed, move the stick away from your body until you feel a stretch in your shoulder. ? Avoid shrugging your shoulders while you move the stick. Keep your shoulder blade tucked down toward the middle of your back. 3. Hold for __________ seconds. 4. Slowly return to the starting position. Repeat __________ times. Complete this exercise __________ times a day. STRENGTHENING  EXERCISES These exercises build strength and endurance in your shoulder. Endurance is the ability to use your muscles for a long time, even after they get tired. Exercise G:External Rotation  1. Sit in a stable chair without armrests. 2. Secure an exercise band at elbow height on your left / right side. 3. Place a soft object, such as a folded towel or a small pillow, between your left / right upper arm and your body to move your elbow a few inches away (about 10 cm) from your side. 4. Hold the end of the band so it is tight and there is no slack. 5. Keeping your elbow pressed against the soft object, move your left / right forearm out, away from your abdomen. Keep your body steady so only your forearm moves. 6. Hold for __________ seconds. 7. Slowly return to the starting position. Repeat __________ times. Complete this exercise __________ times a day. Exercise H:Shoulder Abduction  1. Sit in a stable chair without armrests, or stand. 2. Hold a __________ weight in your left / right hand, or hold an exercise band with both hands.   3. Start with your arms straight down and your left / right palm facing in, toward your body. 4. Slowly lift your left / right hand out to your side. Do not lift your hand above shoulder height unless your health care provider tells you that this is safe. ? Keep your arms straight. ? Avoid shrugging your shoulder while you do this movement. Keep your shoulder blade tucked down toward the middle of your back. 5. Hold for __________ seconds. 6. Slowly lower your arm, and return to the starting position. Repeat __________ times. Complete this exercise __________ times a day. Exercise I:Shoulder Extension 1. Sit in a stable chair without armrests, or stand. 2. Secure an exercise band to a stable object in front of you where it is at shoulder height. 3. Hold one end of the exercise band in each hand. Your palms should face each other. 4. Straighten your elbows and  lift your hands up to shoulder height. 5. Step back, away from the secured end of the exercise band, until the band is tight and there is no slack. 6. Squeeze your shoulder blades together as you pull your hands down to the sides of your thighs. Stop when your hands are straight down by your sides. Do not let your hands go behind your body. 7. Hold for __________ seconds. 8. Slowly return to the starting position. Repeat __________ times. Complete this exercise __________ times a day. Exercise J:Standing Shoulder Row 1. Sit in a stable chair without armrests, or stand. 2. Secure an exercise band to a stable object in front of you so it is at waist height. 3. Hold one end of the exercise band in each hand. Your palms should be in a thumbs-up position. 4. Bend each of your elbows to an "L" shape (about 90 degrees) and keep your upper arms at your sides. 5. Step back until the band is tight and there is no slack. 6. Slowly pull your elbows back behind you. 7. Hold for __________ seconds. 8. Slowly return to the starting position. Repeat __________ times. Complete this exercise __________ times a day. Exercise K:Shoulder Press-Ups  1. Sit in a stable chair that has armrests. Sit upright, with your feet flat on the floor. 2. Put your hands on the armrests so your elbows are bent and your fingers are pointing forward. Your hands should be about even with the sides of your body. 3. Push down on the armrests and use your arms to lift yourself off of the chair. Straighten your elbows and lift yourself up as much as you comfortably can. ? Move your shoulder blades down, and avoid letting your shoulders move up toward your ears. ? Keep your feet on the ground. As you get stronger, your feet should support less of your body weight as you lift yourself up. 4. Hold for __________ seconds. 5. Slowly lower yourself back into the chair. Repeat __________ times. Complete this exercise __________ times a  day. Exercise L: Wall Push-Ups  1. Stand so you are facing a stable wall. Your feet should be about one arm-length away from the wall. 2. Lean forward and place your palms on the wall at shoulder height. 3. Keep your feet flat on the floor as you bend your elbows and lean forward toward the wall. 4. Hold for __________ seconds. 5. Straighten your elbows to push yourself back to the starting position. Repeat __________ times. Complete this exercise __________ times a day. This information is not intended to replace advice   given to you by your health care provider. Make sure you discuss any questions you have with your health care provider. Document Released: 02/27/2005 Document Revised: 01/08/2016 Document Reviewed: 12/25/2014 Elsevier Interactive Patient Education  2018 [employer]  

## 2017-03-03 ENCOUNTER — Other Ambulatory Visit: Payer: Self-pay | Admitting: Physician Assistant

## 2017-03-03 DIAGNOSIS — R110 Nausea: Secondary | ICD-10-CM

## 2017-03-03 MED ORDER — ONDANSETRON 4 MG PO TBDP: 4.0000 mg | ORAL_TABLET | Freq: Three times a day (TID) | ORAL | 0 refills | Status: DC | PRN

## 2017-03-03 MED FILL — ONDANSETRON ODT 4 MG TABLET: 4 | 20 days supply | Qty: 60 | Fill #0

## 2017-03-03 NOTE — Telephone Encounter (Signed)
Done

## 2017-03-03 NOTE — Telephone Encounter (Signed)
Meds ordered this encounter  Medications  . DISCONTD: ondansetron (ZOFRAN-ODT) 4 MG disintegrating tablet    Sig: Take 1 tablet (4 mg total) every 8 (eight) hours as needed by mouth for nausea or vomiting. DISSOLVE 1 TABLET BY MOUTH EVERY 8 HOURS AS NEEDED FOR NAUSEA AND VOMITING    Dispense:  60 tablet    Refill:  0  . ondansetron (ZOFRAN-ODT) 4 MG disintegrating tablet    Sig: Take 1 tablet (4 mg total) every 8 (eight) hours as needed by mouth for nausea or vomiting. DISSOLVE 1 TABLET BY MOUTH EVERY 8 HOURS AS NEEDED FOR NAUSEA AND VOMITING    Dispense:  60 tablet    Refill:  0    Order Specific Question:   Supervising Provider    Answer:   Wardell Honour [[id number]]   I have refilled this medication but it keeps printing out, could you please fax to pt's preferred pharmacy? Thanks!

## 2017-03-03 NOTE — Telephone Encounter (Signed)
Please advise/refill ONDANSETRON ODT 4 MG TABLET 4 TAB

## 2017-03-04 DIAGNOSIS — H524 Presbyopia: Secondary | ICD-10-CM | POA: Diagnosis not present

## 2017-03-04 DIAGNOSIS — H52223 Regular astigmatism, bilateral: Secondary | ICD-10-CM | POA: Diagnosis not present

## 2017-03-04 DIAGNOSIS — H5203 Hypermetropia, bilateral: Secondary | ICD-10-CM | POA: Diagnosis not present

## 2017-03-04 DIAGNOSIS — H5053 Vertical heterophoria: Secondary | ICD-10-CM | POA: Diagnosis not present

## 2017-03-04 DIAGNOSIS — H469 Unspecified optic neuritis: Secondary | ICD-10-CM | POA: Diagnosis not present

## 2017-03-24 ENCOUNTER — Encounter: Payer: Self-pay | Admitting: Diagnostic Neuroimaging

## 2017-03-24 MED ORDER — AMPHETAMINE-DEXTROAMPHET ER 20 MG PO CP24: 20.0000 mg | ORAL_CAPSULE | Freq: Every day | ORAL | 0 refills | Status: DC

## 2017-03-25 DIAGNOSIS — K219 Gastro-esophageal reflux disease without esophagitis: Secondary | ICD-10-CM | POA: Diagnosis not present

## 2017-03-25 DIAGNOSIS — K5904 Chronic idiopathic constipation: Secondary | ICD-10-CM | POA: Diagnosis not present

## 2017-03-25 DIAGNOSIS — K5730 Diverticulosis of large intestine without perforation or abscess without bleeding: Secondary | ICD-10-CM | POA: Diagnosis not present

## 2017-03-25 DIAGNOSIS — R110 Nausea: Secondary | ICD-10-CM | POA: Diagnosis not present

## 2017-03-26 MED FILL — ADDERALL XR 20 MG CAP SA: 20 | 30 days supply | Qty: 30 | Fill #0

## 2017-04-07 ENCOUNTER — Inpatient Hospital Stay: Admission: RE | Admit: 2017-04-07 | Payer: 59 | Source: Ambulatory Visit

## 2017-04-11 ENCOUNTER — Encounter: Payer: Self-pay | Admitting: Rheumatology

## 2017-04-11 NOTE — Telephone Encounter (Signed)
Please reschedule an appointment for Tuesday

## 2017-04-15 ENCOUNTER — Ambulatory Visit: Payer: 59 | Admitting: Rheumatology

## 2017-04-15 ENCOUNTER — Encounter: Payer: Self-pay | Admitting: Rheumatology

## 2017-04-15 VITALS — BP 128/87 | HR 79 | Resp 17

## 2017-04-15 DIAGNOSIS — G8929 Other chronic pain: Secondary | ICD-10-CM

## 2017-04-15 DIAGNOSIS — M810 Age-related osteoporosis without current pathological fracture: Secondary | ICD-10-CM

## 2017-04-15 DIAGNOSIS — M5136 Other intervertebral disc degeneration, lumbar region: Secondary | ICD-10-CM | POA: Diagnosis not present

## 2017-04-15 DIAGNOSIS — M25512 Pain in left shoulder: Secondary | ICD-10-CM | POA: Diagnosis not present

## 2017-04-15 DIAGNOSIS — M5030 Other cervical disc degeneration, unspecified cervical region: Secondary | ICD-10-CM

## 2017-04-15 MED ORDER — TRIAMCINOLONE ACETONIDE 40 MG/ML IJ SUSP
40.0000 mg | INTRAMUSCULAR | Status: AC | PRN
  Administered 2017-04-15: 40 mg via INTRA_ARTICULAR

## 2017-04-15 MED ORDER — LIDOCAINE HCL 1 % IJ SOLN
1.0000 mL | INTRAMUSCULAR | Status: AC | PRN
  Administered 2017-04-15: 1 mL

## 2017-04-15 NOTE — Progress Notes (Signed)
Office Visit Note  Patient: Katie Maldonado             Date of Birth: [birth year]           MRN: [id number]             PCP: Leonie Douglas, PA-C Referring: Leonie Douglas, PA* Visit Date: 04/15/2017 Occupation: @GUAROCC @    Subjective:   Left Shoulder pain   History of Present Illness: Katie Maldonado is a 66 y.o. female with history of osteoarthritis and disc disease. She states she's been having pain in her left shoulder for the last 3-4 weeks now. She was seen about 6 weeks ago for right shoulder joint injection. She had good relief from the right shoulder joint injection. At that time the x-ray of her right shoulder joint was unremarkable. She states the pain from her left shoulder is radiating into her left arm and sometimes goes up to her neck. She's been also having pain and stiffness in her neck. She denies any shortness of breath palpitations and no history of chest pain.  Activities of Daily Living:  Patient reports morning stiffness for 1 hour.   Patient Reports nocturnal pain.  Difficulty dressing/grooming: Denies Difficulty climbing stairs: Denies Difficulty getting out of chair: Denies Difficulty using hands for taps, buttons, cutlery, and/or writing: Denies   Review of Systems  Constitutional: Positive for fatigue. Negative for night sweats, weight gain, weight loss and weakness.  HENT: Negative for mouth sores, trouble swallowing, trouble swallowing, mouth dryness and nose dryness.   Eyes: Negative for pain, redness, visual disturbance and dryness.  Respiratory: Negative for cough, shortness of breath and difficulty breathing.   Cardiovascular: Negative for chest pain, palpitations, hypertension, irregular heartbeat and swelling in legs/feet.  Gastrointestinal: Negative for blood in stool, constipation and diarrhea.  Endocrine: Negative for increased urination.  Genitourinary: Negative for vaginal dryness.  Musculoskeletal: Positive for  arthralgias, joint pain and morning stiffness. Negative for joint swelling, myalgias, muscle weakness, muscle tenderness and myalgias.  Skin: Negative for color change, rash, hair loss, skin tightness, ulcers and sensitivity to sunlight.  Allergic/Immunologic: Negative for susceptible to infections.  Neurological: Negative for dizziness, memory loss and night sweats.  Hematological: Negative for swollen glands.  Psychiatric/Behavioral: Positive for sleep disturbance. Negative for depressed mood. The patient is nervous/anxious.     PMFS History:  Patient Active Problem List   Diagnosis Date Noted  . Osteoarthritis of lumbar spine 10/22/2016  . Rib contusion, left, initial encounter 07/15/2016  . Rib pain on left side 07/15/2016  . Accidental fall 07/15/2016  . History of multiple sclerosis 06/20/2016  . History of anxiety 06/20/2016  . Trochanteric bursitis of left hip 03/12/2016  . Primary osteoarthritis of both feet 03/09/2016  . Primary osteoarthritis of both knees 03/09/2016  . Degenerative cervical disc 10/04/2014  . Primary osteoarthritis of both hands 08/19/2014  . Low back pain 11/26/2013  . Numbness and tingling in left arm 07/09/2013  . Anxiety state, unspecified 12/30/2012  . Other fatigue 12/30/2012  . Osteoporosis 11/13/2012  . Incontinence of urine 04/18/2011  . HYPERLIPIDEMIA-MIXED 01/08/2010  . MULTIPLE SCLEROSIS 01/08/2010    Past Medical History:  Diagnosis Date  . Allergy   . Anxiety   . Arthritis   . Diverticulitis   . Diverticulosis 10/2015  . Hypersomnolence   . Migraine   . Multiple sclerosis ([place])   . Neuromuscular disorder ([place])   . Obesity, unspecified   . Osteoporosis   .  Other and unspecified hyperlipidemia   . TIA (transient ischemic attack) 22    Family History  Problem Relation Age of Onset  . Emphysema Father   . Cancer Brother        bone  . Cancer Maternal Grandmother   . Heart attack Brother   . Cancer Maternal Grandfather   .  Cancer Paternal Grandmother   . Cancer Paternal Grandfather   . Irritable bowel syndrome Son    Past Surgical History:  Procedure Laterality Date  . ABDOMINAL HYSTERECTOMY     Fibroids  . arm surgery left    . TUBAL LIGATION    . WRIST SURGERY     Social History   Social History Narrative   Patient is married [name]), has 2 children   Patient is right handed   Education level is 2 yrs of college   Caffeine consumption is 3-4 cups daily      Exercises at least 2-3 times a week. Walks about 5 miles a week.      Objective: Vital Signs: BP 128/87 (BP Location: Left Arm, Patient Position: Sitting, Cuff Size: Normal)   Pulse 79   Resp 17    Physical Exam  Constitutional: She is oriented to person, place, and time. She appears well-developed and well-nourished.  HENT:  Head: Normocephalic and atraumatic.  Eyes: Conjunctivae and EOM are normal.  Neck: Normal range of motion.  Cardiovascular: Normal rate, regular rhythm, normal heart sounds and intact distal pulses.  Pulmonary/Chest: Effort normal and breath sounds normal.  Abdominal: Soft. Bowel sounds are normal.  Lymphadenopathy:    She has no cervical adenopathy.  Neurological: She is alert and oriented to person, place, and time.  Skin: Skin is warm and dry. Capillary refill takes less than 2 seconds.  Psychiatric: She has a normal mood and affect. Her behavior is normal.  Nursing note and vitals reviewed.    Musculoskeletal Exam: C-spine good range of motion without much discomfort. Lumbar spine limited range of motion. She has painful range of motion of her left shoulder although it was full range of motion. Right shoulder joint is full range of motion. She has mild DIP PIP thickening. Hip joints knee joints are good range of motion.  CDAI Exam: No CDAI exam completed.    Investigation: No additional findings.   Imaging: No results found.  Speciality Comments: No specialty comments  available.    Procedures:  Large Joint Inj: L glenohumeral on 04/15/2017 8:35 AM Indications: pain Details: 27 G 1.5 in needle, posterior approach  Arthrogram: No  Medications: 1 mL lidocaine 1 %; 40 mg triamcinolone acetonide 40 MG/ML Aspirate: 0 mL Outcome: tolerated well, no immediate complications Procedure, treatment alternatives, risks and benefits explained, specific risks discussed. Consent was given by the patient. Immediately prior to procedure a time out was called to verify the correct patient, procedure, equipment, support staff and site/side marked as required. Patient was prepped and draped in the usual sterile fashion.     Allergies: Nitrofurantoin; Sulfonamide derivatives; Betaseron [interferon beta-1b]; Cefdinir; and Copaxone [glatiramer acetate]   Assessment / Plan:     Visit Diagnoses: Chronic left shoulder pain: Left shoulder joint adenopathy. She good response to her right shoulder joint injection. After informed consent was obtained in different treatment options were discussed left shoulder joint was injected with cortisone as described above. Shoulder joint exercises were discussed.  Degenerative cervical disc: Minimal discomfort  DDD (degenerative disc disease), lumbar: Chronic pain     Orders: Orders  Placed This Encounter  Procedures  . Large Joint Inj: L glenohumeral   No orders of the defined types were placed in this encounter.     Follow-Up Instructions: Return for Osteoarthritis DDD.   Bo Merino, MD  Note - This record has been created using Editor, commissioning.  Chart creation errors have been sought, but may not always  have been located. Such creation errors do not reflect on  the standard of medical care.

## 2017-04-23 ENCOUNTER — Ambulatory Visit
Admission: RE | Admit: 2017-04-23 | Discharge: 2017-04-23 | Disposition: A | Discharge status: Home / Self Care | Payer: 59 | Source: Ambulatory Visit | Attending: Physician Assistant | Admitting: Physician Assistant

## 2017-04-23 DIAGNOSIS — M810 Age-related osteoporosis without current pathological fracture: Secondary | ICD-10-CM

## 2017-04-23 DIAGNOSIS — Z780 Asymptomatic menopausal state: Secondary | ICD-10-CM | POA: Diagnosis not present

## 2017-04-28 ENCOUNTER — Ambulatory Visit: Payer: 59 | Admitting: Rheumatology

## 2017-04-30 ENCOUNTER — Other Ambulatory Visit: Payer: Self-pay

## 2017-04-30 ENCOUNTER — Ambulatory Visit (INDEPENDENT_AMBULATORY_CARE_PROVIDER_SITE_OTHER): Payer: 59

## 2017-04-30 ENCOUNTER — Ambulatory Visit: Payer: Self-pay

## 2017-04-30 ENCOUNTER — Encounter: Payer: Self-pay | Admitting: Physician Assistant

## 2017-04-30 ENCOUNTER — Ambulatory Visit: Payer: 59 | Admitting: Physician Assistant

## 2017-04-30 VITALS — BP 127/80 | HR 86 | Temp 98.8°F | Resp 18 | Ht 64.0 in | Wt 162.6 lb

## 2017-04-30 DIAGNOSIS — W19XXXA Unspecified fall, initial encounter: Secondary | ICD-10-CM

## 2017-04-30 DIAGNOSIS — M940 Chondrocostal junction syndrome [Tietze]: Secondary | ICD-10-CM

## 2017-04-30 DIAGNOSIS — S299XXA Unspecified injury of thorax, initial encounter: Secondary | ICD-10-CM | POA: Diagnosis not present

## 2017-04-30 MED ORDER — PREDNISONE 10 MG PO TABS: ORAL_TABLET | ORAL | 0 refills | Status: DC

## 2017-04-30 NOTE — Patient Instructions (Addendum)
You are likely suffering costochondritis. We are going to treat your underlying inflammation with oral prednisone. Prednisone is a steroid and can cause side effects such as headache, irritability, nausea, vomiting, increased heart rate, increased blood pressure, increased blood sugar, appetite changes, and insomnia. Please take tablets in the morning with a full meal to help decrease the chances of these side effects.  Do not take any other anti-inflammatories while on prednisone, such as ibuprofen.  Also recommend applying ice to the affected area 4-5 times a day for 20 minutes at a time.  You can switch out and use heat every other time.  Return to clinic if symptoms worsen, do not improve, or as needed    Costochondritis Costochondritis is swelling and irritation (inflammation) of the tissue (cartilage) that connects your ribs to your breastbone (sternum). This causes pain in the front of your chest. Usually, the pain:  Starts gradually.  Is in more than one rib.  This condition usually goes away on its own over time. Follow these instructions at home:  Do not do anything that makes your pain worse.  If directed, put ice on the painful area: ? Put ice in a plastic bag. ? Place a towel between your skin and the bag. ? Leave the ice on for 20 minutes, 2-3 times a day.  If directed, put heat on the affected area as often as told by your doctor. Use the heat source that your doctor tells you to use, such as a moist heat pack or a heating pad. ? Place a towel between your skin and the heat source. ? Leave the heat on for 20-30 minutes. ? Take off the heat if your skin turns bright red. This is very important if you cannot feel pain, heat, or cold. You may have a greater risk of getting burned.  Take over-the-counter and prescription medicines only as told by your doctor.  Return to your normal activities as told by your doctor. Ask your doctor what activities are safe for you.  Keep  all follow-up visits as told by your doctor. This is important. Contact a doctor if:  You have chills or a fever.  Your pain does not go away or it gets worse.  You have a cough that does not go away. Get help right away if:  You are short of breath. This information is not intended to replace advice given to you by your health care provider. Make sure you discuss any questions you have with your health care provider. Document Released: 10/02/2007 Document Revised: 11/03/2015 Document Reviewed: 08/09/2015 Elsevier Interactive Patient Education  2018 [employer].   IF you received an x-ray today, you will receive an invoice from [facility]. Please contact [facility] at [phone] with questions or concerns regarding your invoice.   IF you received labwork today, you will receive an invoice from [place] [facility]. Please contact [facility] at [phone] with questions or concerns regarding your invoice.   Our billing staff will not be able to assist you with questions regarding bills from these companies.  You will be contacted with the lab results as soon as they are available. The fastest way to get your results is to activate your My Chart account. Instructions are located on the last page of this paperwork. If you have not heard from [place] regarding the results in 2 weeks, please contact this office.

## 2017-04-30 NOTE — Progress Notes (Signed)
Katie Maldonado  MRN: [id number] DOB: [birth year]  Subjective:  Katie Maldonado is a 67 y.o. female seen in office today for a chief complaint of fall. She notes has had of anterior and posterior  rib pain x 8 days. Pt tripped and landed right on her chest 8 days ago. She did not hit her head or have LOC. Has had pain ever since when she takes a deep breath in or when she moves in certain positions. Has tried heating pad and advil. This will provide some relief but no full relief. Denies wrist pain, SOB, heart palpations, bruising, redness, and warmth. She is concerned she broke a rib. Has hx of MS and osteoporosis. She does not like to take pain medications or muscle relaxants (anything that makes her groggy). Has no other questions or concerns.   Review of Systems  Constitutional: Negative for activity change, chills, fatigue and fever.  Respiratory: Negative for cough and wheezing.   Cardiovascular: Negative for leg swelling.  Gastrointestinal: Negative for nausea and vomiting.  Musculoskeletal: Negative for neck pain.  Skin: Negative for rash.  Neurological: Negative for dizziness, weakness and light-headedness.  Psychiatric/Behavioral: Negative for confusion and decreased concentration.    Patient Active Problem List   Diagnosis Date Noted  . Osteoarthritis of lumbar spine 10/22/2016  . Rib contusion, left, initial encounter 07/15/2016  . Rib pain on left side 07/15/2016  . Accidental fall 07/15/2016  . History of multiple sclerosis 06/20/2016  . History of anxiety 06/20/2016  . Trochanteric bursitis of left hip 03/12/2016  . Primary osteoarthritis of both feet 03/09/2016  . Primary osteoarthritis of both knees 03/09/2016  . Degenerative cervical disc 10/04/2014  . Primary osteoarthritis of both hands 08/19/2014  . Low back pain 11/26/2013  . Numbness and tingling in left arm 07/09/2013  . Anxiety state, unspecified 12/30/2012  . Other fatigue 12/30/2012  .  Osteoporosis 11/13/2012  . Incontinence of urine 04/18/2011  . HYPERLIPIDEMIA-MIXED 01/08/2010  . MULTIPLE SCLEROSIS 01/08/2010    Current Outpatient Medications on File Prior to Visit  Medication Sig Dispense Refill  . alendronate (FOSAMAX) 70 MG tablet Take 1 tablet (70 mg total) by mouth once a week. Take with a full glass of water on an empty stomach. 12 tablet 3  . amphetamine-dextroamphetamine (ADDERALL XR) 20 MG 24 hr capsule Take 1 capsule (20 mg total) by mouth daily. 30 capsule 0  . aspirin EC 81 MG tablet Take 1 tablet (81 mg total) by mouth daily.    . Biotin 10 MG TABS Take by mouth.    . cholecalciferol (VITAMIN D) 1000 UNITS tablet Take 1,000 Units by mouth daily. Reported on 08/08/2015    . citalopram (CELEXA) 20 MG tablet Take 1 tablet (20 mg total) by mouth daily. 90 tablet 3  . Dimethyl Fumarate (TECFIDERA) 240 MG CPDR Take 240 mg by mouth 2 (two) times daily.    Marland Kitchen ibuprofen (ADVIL,MOTRIN) 200 MG tablet Take 2 tablets (400 mg total) by mouth 4 (four) times daily.    . Omega-3 Fatty Acids (FISH OIL) 1000 MG CAPS Take 1,000 mg by mouth 2 (two) times daily.   0  . omeprazole (PRILOSEC) 20 MG capsule Take 1 capsule (20 mg total) by mouth daily. 90 capsule 3  . ondansetron (ZOFRAN-ODT) 4 MG disintegrating tablet Take 1 tablet (4 mg total) every 8 (eight) hours as needed by mouth for nausea or vomiting. DISSOLVE 1 TABLET BY MOUTH EVERY 8 HOURS AS NEEDED FOR NAUSEA AND  VOMITING 60 tablet 0  . TURMERIC PO Take 1 tablet by mouth daily.    . ciprofloxacin (CIPRO) 500 MG tablet Take 1 tablet (500 mg total) by mouth 2 (two) times daily. (Patient not taking: Reported on 02/28/2017) 10 tablet 0  . HYDROcodone-acetaminophen (NORCO) 5-325 MG tablet Take 1 tablet by mouth every 6 (six) hours as needed for moderate pain. (Patient not taking: Reported on 07/29/2016) 15 tablet 0  . mirabegron ER (MYRBETRIQ) 50 MG TB24 tablet Take 50 mg by mouth daily.     Current Facility-Administered Medications  on File Prior to Visit  Medication Dose Route Frequency Provider Last Rate Last Dose  . gadopentetate dimeglumine (MAGNEVIST) injection 15 mL  15 mL Intravenous Once PRN Penumalli, Vikram R, MD      . gadopentetate dimeglumine (MAGNEVIST) injection 15 mL  15 mL Intravenous Once PRN Penumalli, Vikram R, MD      . gadopentetate dimeglumine (MAGNEVIST) injection 16 mL  16 mL Intravenous Once PRN Penumalli, Earlean Polka, MD        Allergies  Allergen Reactions  . Nitrofurantoin Shortness Of Breath    Shortness of breath, violent chills, dizziness, nausea  . Sulfonamide Derivatives Shortness Of Breath    Shortness of breath, chills  . Betaseron [Interferon Beta-1b]     Shortness of breath, rash, heart palpatations  . Cefdinir     Can't remember what rx had 2 yrs ago  . Copaxone [Glatiramer Acetate]      Objective:  BP 127/80 (BP Location: Left Arm, Patient Position: Sitting, Cuff Size: Large)   Pulse 86   Temp 98.8 F (37.1 C) (Oral)   Resp 18   Ht 5\' 4"  (1.626 m)   Wt 162 lb 9.6 oz (73.8 kg)   SpO2 98%   BMI 27.91 kg/m   Physical Exam  Constitutional: She is oriented to person, place, and time and well-developed, well-nourished, and in no distress.  HENT:  Head: Normocephalic and atraumatic.  Eyes: Conjunctivae are normal.  Neck: Normal range of motion.  Pulmonary/Chest: Effort normal and breath sounds normal. She has no decreased breath sounds. She has no wheezes. She has no rhonchi. She has no rales.    Neurological: She is alert and oriented to person, place, and time. She has normal sensation and intact cranial nerves. Gait normal.  Skin: Skin is warm and dry. No abrasion and no bruising noted. No erythema.  Psychiatric: Affect normal.  Vitals reviewed.    Dg Chest 2 View  Result Date: 04/30/2017 CLINICAL DATA:  Fall on chest 8 days ago. EXAM: CHEST  2 VIEW COMPARISON:  Chest x-ray dated 07/15/2016. FINDINGS: Heart size and mediastinal contours are stable, within normal  limits. Lungs are clear. No pleural effusion or pneumothorax seen. Stable mild scoliosis. No acute or suspicious osseous finding. No osseous fracture seen. IMPRESSION: No acute findings. Lungs are clear. No osseous fracture seen. Stable scoliosis. Electronically Signed   By: Franki Cabot M.D.   On: 04/30/2017 17:01     Assessment and Plan :  1. Fall, initial encounter - DG Chest 2 View; Future 2. Costochondritis History and physical exam findings consistent with costochondritis due to fall.  Plain film of the chest with no acute findings.  Due to no full relief with over-the-counter NSAIDs, will attempt trial of prednisone at this time.  Patient encouraged to switch from to ice and heat and apply to the affected area 4-5 times a day for 20 minutes.  Advised to return to  clinic if symptoms worsen, do not improve with treatment, or as needed. Meds ordered this encounter  Medications  . predniSONE (DELTASONE) 10 MG tablet    Sig: 6-5-4-3-2-1 taper. Take all the tablets for that day in the am with food.    Dispense:  21 tablet    Refill:  0    Order Specific Question:   Supervising Provider    Answer:   Reginia Forts M [[id number]]    Tenna Delaine PA-C  [facility] 04/30/2017 5:35 PM

## 2017-05-01 MED FILL — OMEPRAZOLE 20 MG CAP: 20 | 90 days supply | Qty: 90 | Fill #1

## 2017-05-01 MED FILL — CITALOPRAM HBR 20 MG TABLET: 20 | 90 days supply | Qty: 90 | Fill #1

## 2017-05-01 MED FILL — predniSONE 10 MG (21) TBPK: 10 | 6 days supply | Qty: 21 | Fill #0

## 2017-05-02 ENCOUNTER — Encounter: Payer: Self-pay | Admitting: Diagnostic Neuroimaging

## 2017-05-05 ENCOUNTER — Other Ambulatory Visit: Payer: Self-pay | Admitting: *Deleted

## 2017-05-05 ENCOUNTER — Encounter: Payer: Self-pay | Admitting: Diagnostic Neuroimaging

## 2017-05-05 MED ORDER — AMPHETAMINE-DEXTROAMPHET ER 20 MG PO CP24: 20.0000 mg | ORAL_CAPSULE | Freq: Every day | ORAL | 0 refills | Status: DC

## 2017-05-05 NOTE — Addendum Note (Signed)
Addended by: Oliver Hum S on: 05/05/2017 12:17 PM   Modules accepted: Orders

## 2017-05-06 MED FILL — ADDERALL XR 20 MG CAP SA: 20 | 30 days supply | Qty: 30 | Fill #0

## 2017-05-08 DIAGNOSIS — D692 Other nonthrombocytopenic purpura: Secondary | ICD-10-CM | POA: Diagnosis not present

## 2017-05-13 ENCOUNTER — Encounter: Payer: Self-pay | Admitting: Physician Assistant

## 2017-05-20 ENCOUNTER — Encounter: Payer: Self-pay | Admitting: *Deleted

## 2017-05-20 NOTE — Progress Notes (Signed)
Received from [facility], need for more information for Tecfidera PA. Answered that the MS is R/R and tecfidera is 240mg  po BID.  Fax confirmation received [phone], [phone].  Ref # [id number], Member ID [id number].

## 2017-05-23 ENCOUNTER — Telehealth: Payer: Self-pay | Admitting: *Deleted

## 2017-05-23 NOTE — Telephone Encounter (Signed)
Spoke to [facility] with [employer] and approval for tecfidera 240mg  po BID 05/15/2017 thru 05-14-2018. [phone].  ID [id number]  PA# [id number].

## 2017-05-26 ENCOUNTER — Telehealth: Payer: Self-pay | Admitting: *Deleted

## 2017-05-26 MED ORDER — DIMETHYL FUMARATE 240 MG PO CPDR: 240.0000 mg | DELAYED_RELEASE_CAPSULE | Freq: Two times a day (BID) | ORAL | 1 refills | Status: DC

## 2017-05-26 NOTE — Telephone Encounter (Signed)
Prescription escribed to [facility].

## 2017-06-05 ENCOUNTER — Encounter: Payer: Self-pay | Admitting: Physician Assistant

## 2017-06-05 ENCOUNTER — Ambulatory Visit: Payer: 59

## 2017-06-05 ENCOUNTER — Other Ambulatory Visit: Payer: Self-pay | Admitting: Physician Assistant

## 2017-06-05 DIAGNOSIS — Z0000 Encounter for general adult medical examination without abnormal findings: Secondary | ICD-10-CM

## 2017-06-05 DIAGNOSIS — M810 Age-related osteoporosis without current pathological fracture: Secondary | ICD-10-CM

## 2017-06-05 MED ORDER — OSELTAMIVIR PHOSPHATE 75 MG PO CAPS: 75.0000 mg | ORAL_CAPSULE | Freq: Every day | ORAL | 0 refills | Status: DC

## 2017-06-05 NOTE — Progress Notes (Signed)
Meds ordered this encounter  Medications  . oseltamivir (TAMIFLU) 75 MG capsule    Sig: Take 1 capsule (75 mg total) by mouth daily.    Dispense:  7 capsule    Refill:  0    Order Specific Question:   Supervising Provider    Answer:   Wardell Honour [[id number]]

## 2017-06-06 ENCOUNTER — Encounter: Payer: Self-pay | Admitting: Physician Assistant

## 2017-06-06 DIAGNOSIS — M810 Age-related osteoporosis without current pathological fracture: Secondary | ICD-10-CM | POA: Diagnosis not present

## 2017-06-06 LAB — CMP14+EGFR
A/G RATIO: 1.7 (ref 1.2–2.2)
ALBUMIN: 4.2 g/dL (ref 3.6–4.8)
ALT: 18 IU/L (ref 0–32)
AST: 20 IU/L (ref 0–40)
Alkaline Phosphatase: 91 IU/L (ref 39–117)
BILIRUBIN TOTAL: 0.7 mg/dL (ref 0.0–1.2)
BUN / CREAT RATIO: 18 (ref 12–28)
BUN: 15 mg/dL (ref 8–27)
CALCIUM: 9.5 mg/dL (ref 8.7–10.3)
CHLORIDE: 100 mmol/L (ref 96–106)
CO2: 26 mmol/L (ref 20–29)
Creatinine, Ser: 0.83 mg/dL (ref 0.57–1.00)
GFR, EST AFRICAN AMERICAN: 86 mL/min/{1.73_m2} (ref >59)
GFR, EST NON AFRICAN AMERICAN: 75 mL/min/{1.73_m2} (ref >59)
Globulin, Total: 2.5 g/dL (ref 1.5–4.5)
Glucose: 90 mg/dL (ref 65–99)
POTASSIUM: 4.5 mmol/L (ref 3.5–5.2)
Sodium: 139 mmol/L (ref 134–144)
Total Protein: 6.7 g/dL (ref 6.0–8.5)

## 2017-06-06 LAB — CBC WITH DIFFERENTIAL/PLATELET
BASOS ABS: 0 10*3/uL (ref 0.0–0.2)
BASOS: 1 %
EOS (ABSOLUTE): 0 10*3/uL (ref 0.0–0.4)
Eos: 1 %
HEMOGLOBIN: 12.7 g/dL (ref 11.1–15.9)
Hematocrit: 38.8 % (ref 34.0–46.6)
IMMATURE GRANS (ABS): 0.1 10*3/uL (ref 0.0–0.1)
Immature Granulocytes: 1 %
LYMPHS ABS: 0.8 10*3/uL (ref 0.7–3.1)
Lymphs: 19 %
MCH: 28.7 pg (ref 26.6–33.0)
MCHC: 32.7 g/dL (ref 31.5–35.7)
MCV: 88 fL (ref 79–97)
MONOCYTES: 1 %
Monocytes Absolute: 0 10*3/uL — ABNORMAL LOW (ref 0.1–0.9)
NEUTROS ABS: 3.5 10*3/uL (ref 1.4–7.0)
Neutrophils: 77 %
Platelets: 291 10*3/uL (ref 150–379)
RBC: 4.43 x10E6/uL (ref 3.77–5.28)
RDW: 13.9 % (ref 12.3–15.4)
WBC: 4.4 10*3/uL (ref 3.4–10.8)

## 2017-06-06 LAB — MAGNESIUM: MAGNESIUM: 2 mg/dL (ref 1.6–2.3)

## 2017-06-06 LAB — LIPID PANEL
CHOLESTEROL TOTAL: 229 mg/dL — AB (ref 100–199)
Chol/HDL Ratio: 3.2 ratio (ref 0.0–4.4)
HDL: 71 mg/dL (ref >39)
LDL Calculated: 144 mg/dL — ABNORMAL HIGH (ref 0–99)
TRIGLYCERIDES: 69 mg/dL (ref 0–149)
VLDL Cholesterol Cal: 14 mg/dL (ref 5–40)

## 2017-06-06 LAB — TSH: TSH: 2.01 u[IU]/mL (ref 0.450–4.500)

## 2017-06-06 MED FILL — OSELTAMIVIR PHOSPHATE 75 MG: 75 | 7 days supply | Qty: 7 | Fill #0

## 2017-06-07 LAB — VITAMIN D 25 HYDROXY (VIT D DEFICIENCY, FRACTURES): Vit D, 25-Hydroxy: 29.7 ng/mL — ABNORMAL LOW (ref 30.0–100.0)

## 2017-06-09 ENCOUNTER — Other Ambulatory Visit: Payer: Self-pay

## 2017-06-09 ENCOUNTER — Encounter: Payer: Self-pay | Admitting: Physician Assistant

## 2017-06-09 ENCOUNTER — Ambulatory Visit (INDEPENDENT_AMBULATORY_CARE_PROVIDER_SITE_OTHER): Payer: 59 | Admitting: Physician Assistant

## 2017-06-09 VITALS — BP 120/82 | HR 88 | Temp 98.7°F | Resp 18 | Ht 64.37 in | Wt 157.6 lb

## 2017-06-09 DIAGNOSIS — M810 Age-related osteoporosis without current pathological fracture: Secondary | ICD-10-CM

## 2017-06-09 DIAGNOSIS — F419 Anxiety disorder, unspecified: Secondary | ICD-10-CM | POA: Diagnosis not present

## 2017-06-09 DIAGNOSIS — K219 Gastro-esophageal reflux disease without esophagitis: Secondary | ICD-10-CM | POA: Diagnosis not present

## 2017-06-09 DIAGNOSIS — R296 Repeated falls: Secondary | ICD-10-CM | POA: Diagnosis not present

## 2017-06-09 DIAGNOSIS — Z0000 Encounter for general adult medical examination without abnormal findings: Secondary | ICD-10-CM | POA: Diagnosis not present

## 2017-06-09 DIAGNOSIS — Z1231 Encounter for screening mammogram for malignant neoplasm of breast: Secondary | ICD-10-CM | POA: Diagnosis not present

## 2017-06-09 DIAGNOSIS — Z1389 Encounter for screening for other disorder: Secondary | ICD-10-CM | POA: Diagnosis not present

## 2017-06-09 DIAGNOSIS — G35 Multiple sclerosis: Secondary | ICD-10-CM

## 2017-06-09 DIAGNOSIS — Z1239 Encounter for other screening for malignant neoplasm of breast: Secondary | ICD-10-CM

## 2017-06-09 DIAGNOSIS — R311 Benign essential microscopic hematuria: Secondary | ICD-10-CM

## 2017-06-09 MED ORDER — ALENDRONATE SODIUM 10 MG PO TABS: 10.0000 mg | ORAL_TABLET | Freq: Every day | ORAL | 3 refills | Status: DC

## 2017-06-09 MED FILL — ALENDRONATE NA 10 MG TAB: 10 | 90 days supply | Qty: 90 | Fill #0

## 2017-06-09 NOTE — Progress Notes (Signed)
Katie Maldonado  MRN: [id number] DOB: [birth year]  Subjective:  Pt is a 67 y.o. female who presents for annual physical exam.  Diet: She likes most foods. Eats lots of fish, chicken, greens,, and fruits. Eats cheese and drinks milk daily. . Does not like red meat. She drinks water and diet sprite.  Exercise: Walks 30 minutes 4 days a week.  Bowel movements: Typically has BM every other day.   Social: Has a good social life. Has good set of friends. Married with 2 children. Pt is still sexually active in monogamous relationship. No concern for STDs.   Last annual exam: 2018 Last dental exam: 2016 Last vision exam:  2018, has Rx eyeglasses but does not have them with her today. Last pap smear: Hysterectomy 1990 Last mammogram: 04/09/2017 Last colonoscopy: 12/2016 Vaccinations      Tetanus: 08/28/2015      Zostavax: 11/17/2012      Influenza: 12/2016   Chronic medical conditions: 1) MS: Followed closely by Penumalli. Contrlolled on dimethyl fumerate 240mg  daily and Adderall Exar 20 mg daily. 2)Osteoporosis: Was started on fosfamax in 2016, took for a year, ran out. Couldn't remember the once a week tablets. Prefers daily if available.  She has stopped taking vitamin D supplementation.  She notes she gets enough calcium in her diet.  last bone density was 04/23/2017.  Spine T score had decreased from -2.9 in 2016 to -3.4.  Hip T score had increased from -3.7 in 2016 to -2.9. 3) Anxiety: Patient has had a dx of anxiety for a number of years. She is currently controlled on celexa 20mg  daily. She feels as if her anxiety is currently well controlled. She is not currently seeing a therapist. Denies nausea, vomiting, diarrhea, headache, dysphoric mood and suicidal ideations.  4) GERD: Pt has been taking prilosec 20mg  daily for the past 2 years. Notes it controls her heartburn and epigastric pain very well. Can tell a difference if she does not take it. Magnesium checked in 05/2017 and  was normal. She last saw GI in 12/2016 and they did not recommend endoscopy, said she could continue with prilosec daily.  5) Falls: Patient notes she is very clumsy individual.  She is always tripping over objects.  She has had at least 3 falls in the past 3 months.  Denies fractures.  She used to go to balance therapy, which helped but has not gone in years.  Denies dizziness, lightheadedness, blurred vision, and syncope.    Patient Active Problem List   Diagnosis Date Noted  . Osteoarthritis of lumbar spine 10/22/2016  . Primary osteoarthritis of both feet 03/09/2016  . Primary osteoarthritis of both knees 03/09/2016  . Degenerative cervical disc 10/04/2014  . Primary osteoarthritis of both hands 08/19/2014  . Osteoporosis 11/13/2012  . Incontinence of urine 04/18/2011  . HYPERLIPIDEMIA-MIXED 01/08/2010  . MULTIPLE SCLEROSIS 01/08/2010    Current Outpatient Medications on File Prior to Visit  Medication Sig Dispense Refill  . amphetamine-dextroamphetamine (ADDERALL XR) 20 MG 24 hr capsule Take 1 capsule (20 mg total) by mouth daily. 30 capsule 0  . citalopram (CELEXA) 20 MG tablet Take 1 tablet (20 mg total) by mouth daily. 90 tablet 3  . diclofenac sodium (VOLTAREN) 1 % GEL Place 1 % onto the skin.    . Dimethyl Fumarate (TECFIDERA) 240 MG CPDR Take 1 capsule (240 mg total) by mouth 2 (two) times daily. 180 capsule 1  . ibuprofen (ADVIL,MOTRIN) 200 MG tablet Take 2 tablets (400  mg total) by mouth 4 (four) times daily.    Marland Kitchen omeprazole (PRILOSEC) 20 MG capsule Take 1 capsule (20 mg total) by mouth daily. 90 capsule 3  . ondansetron (ZOFRAN-ODT) 4 MG disintegrating tablet Take 1 tablet (4 mg total) every 8 (eight) hours as needed by mouth for nausea or vomiting. DISSOLVE 1 TABLET BY MOUTH EVERY 8 HOURS AS NEEDED FOR NAUSEA AND VOMITING 60 tablet 0   Current Facility-Administered Medications on File Prior to Visit  Medication Dose Route Frequency Provider Last Rate Last Dose  .  gadopentetate dimeglumine (MAGNEVIST) injection 15 mL  15 mL Intravenous Once PRN Penumalli, Vikram R, MD      . gadopentetate dimeglumine (MAGNEVIST) injection 15 mL  15 mL Intravenous Once PRN Penumalli, Vikram R, MD      . gadopentetate dimeglumine (MAGNEVIST) injection 16 mL  16 mL Intravenous Once PRN Penumalli, Earlean Polka, MD        Allergies  Allergen Reactions  . Nitrofurantoin Shortness Of Breath    Shortness of breath, violent chills, dizziness, nausea  . Sulfonamide Derivatives Shortness Of Breath    Shortness of breath, chills  . Betaseron [Interferon Beta-1b]     Shortness of breath, rash, heart palpatations  . Cefdinir     Can't remember what rx had 2 yrs ago  . Copaxone [Glatiramer Acetate]     Social History   Socioeconomic History  . Marital status: Married    Spouse name: Katie Maldonado"  . Number of children: 2  . Years of education: college  . Highest education level: None  Social Needs  . Financial resource strain: None  . Food insecurity - worry: None  . Food insecurity - inability: None  . Transportation needs - medical: None  . Transportation needs - non-medical: None  Occupational History  . Occupation: medical records    Employer: [employer]  . Occupation: [place]/HEALTH INFORMATION    Employer: [employer]  Tobacco Use  . Smoking status: Former Smoker    Years: 37.00    Types: Cigarettes    Last attempt to quit: 02/26/2004    Years since quitting: 13.2  . Smokeless tobacco: Never Used  Substance and Sexual Activity  . Alcohol use: Yes    Alcohol/week: 0.0 oz    Comment: occasionally   . Drug use: No  . Sexual activity: Yes    Birth control/protection: None  Other Topics Concern  . None  Social History Narrative   Patient is married Katie Maldonado), has 2 children   Patient is right handed   Education level is 2 yrs of college   Caffeine consumption is 3-4 cups daily    Past Surgical History:  Procedure Laterality Date  . ABDOMINAL  HYSTERECTOMY     Fibroids  . arm surgery left    . FRACTURE SURGERY    . TUBAL LIGATION    . WRIST SURGERY      Family History  Problem Relation Age of Onset  . Emphysema Father   . Cancer Brother        bone  . Cancer Maternal Grandmother   . Heart attack Brother   . Cancer Maternal Grandfather   . Cancer Paternal Grandmother   . Cancer Paternal Grandfather   . Irritable bowel syndrome Son     Review of Systems  Constitutional: Negative for activity change, appetite change, chills, diaphoresis, fatigue, fever and unexpected weight change.  HENT: Negative for congestion, dental problem, drooling, ear discharge, ear pain, facial swelling,  hearing loss, mouth sores, nosebleeds, postnasal drip, rhinorrhea, sinus pressure, sinus pain, sneezing, sore throat, tinnitus, trouble swallowing and voice change.   Eyes: Negative for photophobia, pain, discharge, redness, itching and visual disturbance.  Respiratory: Negative for apnea, cough, choking, chest tightness, shortness of breath, wheezing and stridor.   Cardiovascular: Negative for chest pain, palpitations and leg swelling.  Gastrointestinal: Negative for abdominal distention, abdominal pain, anal bleeding, blood in stool, constipation, diarrhea, nausea, rectal pain and vomiting.  Endocrine: Negative for cold intolerance, heat intolerance, polydipsia, polyphagia and polyuria.  Genitourinary: Negative for decreased urine volume, difficulty urinating, dyspareunia, dysuria, enuresis, flank pain, frequency, genital sores, hematuria, menstrual problem, pelvic pain, urgency, vaginal bleeding, vaginal discharge and vaginal pain.  Musculoskeletal: Negative for arthralgias, back pain, gait problem, joint swelling, myalgias, neck pain and neck stiffness.  Skin: Negative for color change, pallor, rash and wound.  Allergic/Immunologic: Negative for environmental allergies, food allergies and immunocompromised state.  Neurological: Negative for  dizziness, tremors, seizures, syncope, facial asymmetry, speech difficulty, weakness, light-headedness, numbness and headaches.  Hematological: Negative for adenopathy. Does not bruise/bleed easily.  Psychiatric/Behavioral: Negative for agitation, behavioral problems, confusion, decreased concentration, dysphoric mood, hallucinations, self-injury, sleep disturbance and suicidal ideas. The patient is not nervous/anxious and is not hyperactive.     Objective:  BP 120/82 (BP Location: Left Arm, Patient Position: Sitting, Cuff Size: Normal)   Pulse 88   Temp 98.7 F (37.1 C) (Oral)   Resp 18   Ht 5' 4.37" (1.635 m)   Wt 157 lb 9.6 oz (71.5 kg)   SpO2 96%   BMI 26.74 kg/m    Physical Exam  Constitutional: She is oriented to person, place, and time and well-developed, well-nourished, and in no distress.  HENT:  Head: Normocephalic and atraumatic.  Right Ear: Hearing, tympanic membrane, external ear and ear canal normal.  Left Ear: Hearing, tympanic membrane, external ear and ear canal normal.  Nose: Nose normal.  Mouth/Throat: Uvula is midline, oropharynx is clear and moist and mucous membranes are normal. No oropharyngeal exudate.  Eyes: Conjunctivae, EOM and lids are normal. Pupils are equal, round, and reactive to light. No scleral icterus.  Neck: Trachea normal and normal range of motion. No thyroid mass and no thyromegaly present.  Cardiovascular: Normal rate, regular rhythm, normal heart sounds and intact distal pulses.  Pulmonary/Chest: Effort normal and breath sounds normal. Right breast exhibits no inverted nipple, no mass, no nipple discharge, no skin change and no tenderness. Left breast exhibits no inverted nipple, no mass, no nipple discharge, no skin change and no tenderness. Breasts are symmetrical.  Abdominal: Soft. Normal appearance and bowel sounds are normal. There is no tenderness.  Lymphadenopathy:       Head (right side): No tonsillar, no preauricular, no posterior  auricular and no occipital adenopathy present.       Head (left side): No tonsillar, no preauricular, no posterior auricular and no occipital adenopathy present.    She has no cervical adenopathy.       Right: No supraclavicular adenopathy present.       Left: No supraclavicular adenopathy present.  Neurological: She is alert and oriented to person, place, and time. She has normal sensation, normal strength and normal reflexes. Gait normal.  Skin: Skin is warm and dry.  Psychiatric: Affect normal.    Visual Acuity Screening   Right eye Left eye Both eyes  Without correction:     With correction: 20/40 20/30 20/30     Assessment and Plan :  Discussed  healthy lifestyle, diet, exercise, preventative care, vaccinations, and addressed patient's concerns. Plan for follow up in one year. Otherwise, plan for specific conditions below.  1. Annual physical exam Discuss lab results with patient in office.  2. Screening for breast cancer - MM Digital Diagnostic Bilat; Future  3. Screening for hematuria or proteinuria - Urinalysis dipstick  4. Recurrent falls Due to recurrent falls, patient would highly benefit from physical therapy for balance training. - Ambulatory referral to Physical Therapy  5. Osteoporosis without current pathological fracture, unspecified osteoporosis type Patient did not do well with Fosamax once weekly due to forgetting to take tablet.  She prefers daily treatment.  Will attempt daily therapy.  If patient does not tolerate this, consider referral to endocrinology for further treatment options.  Educated pt on getting adequate amounts of both vitamin D and calcium daily.  Recommended she take over-the-counter supplementation.  Educated on potential side effects of Fosamax.  Plan to repeat bone density in 2 years. - alendronate (FOSAMAX) 10 MG tablet; Take 1 tablet (10 mg total) by mouth daily before breakfast. Take with a full glass of water on an empty stomach. Avoid  lying down x 30 min.  Dispense: 90 tablet; Refill: 3  6. Anxiety 7. Gastroesophageal reflux disease, esophagitis presence not specified 8. MULTIPLE SCLEROSIS    Tenna Delaine, PA-C  [facility] 06/09/2017 9:46 AM

## 2017-06-09 NOTE — Progress Notes (Deleted)
Katie Maldonado  MRN: [id number] DOB: 1951-02-20  Subjective:  [name] @ is a 67 y.o. female who presents for annual physical exam and ***.  Last dental exam:  Last vision exam: Last pap smear: Last mammogram: Last colonoscopy: Vaccinations      Tetanus      HPV      Zostavax  Patient Active Problem List   Diagnosis Date Noted  . Osteoarthritis of lumbar spine 10/22/2016  . Rib contusion, left, initial encounter 07/15/2016  . Rib pain on left side 07/15/2016  . Accidental fall 07/15/2016  . History of multiple sclerosis 06/20/2016  . History of anxiety 06/20/2016  . Trochanteric bursitis of left hip 03/12/2016  . Primary osteoarthritis of both feet 03/09/2016  . Primary osteoarthritis of both knees 03/09/2016  . Degenerative cervical disc 10/04/2014  . Primary osteoarthritis of both hands 08/19/2014  . Low back pain 11/26/2013  . Numbness and tingling in left arm 07/09/2013  . Anxiety state, unspecified 12/30/2012  . Other fatigue 12/30/2012  . Osteoporosis 11/13/2012  . Incontinence of urine 04/18/2011  . HYPERLIPIDEMIA-MIXED 01/08/2010  . MULTIPLE SCLEROSIS 01/08/2010    Current Outpatient Medications on File Prior to Visit  Medication Sig Dispense Refill  . amphetamine-dextroamphetamine (ADDERALL XR) 20 MG 24 hr capsule Take 1 capsule (20 mg total) by mouth daily. 30 capsule 0  . citalopram (CELEXA) 20 MG tablet Take 1 tablet (20 mg total) by mouth daily. 90 tablet 3  . diclofenac sodium (VOLTAREN) 1 % GEL Place 1 % onto the skin.    . Dimethyl Fumarate (TECFIDERA) 240 MG CPDR Take 1 capsule (240 mg total) by mouth 2 (two) times daily. 180 capsule 1  . ibuprofen (ADVIL,MOTRIN) 200 MG tablet Take 2 tablets (400 mg total) by mouth 4 (four) times daily.    Marland Kitchen omeprazole (PRILOSEC) 20 MG capsule Take 1 capsule (20 mg total) by mouth daily. 90 capsule 3  . ondansetron (ZOFRAN) 4 MG tablet Take by mouth.    . ondansetron (ZOFRAN-ODT) 4 MG disintegrating tablet  Take 1 tablet (4 mg total) every 8 (eight) hours as needed by mouth for nausea or vomiting. DISSOLVE 1 TABLET BY MOUTH EVERY 8 HOURS AS NEEDED FOR NAUSEA AND VOMITING 60 tablet 0  . oseltamivir (TAMIFLU) 75 MG capsule Take 1 capsule (75 mg total) by mouth daily. 7 capsule 0  . TURMERIC PO Take 1 tablet by mouth daily.    Marland Kitchen alendronate (FOSAMAX) 70 MG tablet Take 1 tablet (70 mg total) by mouth once a week. Take with a full glass of water on an empty stomach. (Patient not taking: Reported on 06/09/2017) 12 tablet 3  . aspirin EC 81 MG tablet Take 1 tablet (81 mg total) by mouth daily.    . Biotin 10 MG TABS Take by mouth.    . cholecalciferol (VITAMIN D) 1000 UNITS tablet Take 1,000 Units by mouth daily. Reported on 08/08/2015    . Omega-3 Fatty Acids (FISH OIL) 1000 MG CAPS Take 1,000 mg by mouth 2 (two) times daily.   0  . predniSONE (DELTASONE) 10 MG tablet 6-5-4-3-2-1 taper. Take all the tablets for that day in the am with food. (Patient not taking: Reported on 06/09/2017) 21 tablet 0   Current Facility-Administered Medications on File Prior to Visit  Medication Dose Route Frequency Provider Last Rate Last Dose  . gadopentetate dimeglumine (MAGNEVIST) injection 15 mL  15 mL Intravenous Once PRN Penumalli, Earlean Polka, MD      . gadopentetate  dimeglumine (MAGNEVIST) injection 15 mL  15 mL Intravenous Once PRN Penumalli, Vikram R, MD      . gadopentetate dimeglumine (MAGNEVIST) injection 16 mL  16 mL Intravenous Once PRN Penumalli, Earlean Polka, MD        Allergies  Allergen Reactions  . Nitrofurantoin Shortness Of Breath    Shortness of breath, violent chills, dizziness, nausea  . Sulfonamide Derivatives Shortness Of Breath    Shortness of breath, chills  . Betaseron [Interferon Beta-1b]     Shortness of breath, rash, heart palpatations  . Cefdinir     Can't remember what rx had 2 yrs ago  . Copaxone [Glatiramer Acetate]     Social History   Socioeconomic History  . Marital status: Married      Spouse name: Dyke Brackett"  . Number of children: 2  . Years of education: college  . Highest education level: None  Social Needs  . Financial resource strain: None  . Food insecurity - worry: None  . Food insecurity - inability: None  . Transportation needs - medical: None  . Transportation needs - non-medical: None  Occupational History  . Occupation: medical records    Employer: [employer]  . Occupation: [place]/HEALTH INFORMATION    Employer: [employer]  Tobacco Use  . Smoking status: Former Smoker    Years: 37.00    Types: Cigarettes    Last attempt to quit: 02/26/2004    Years since quitting: 13.2  . Smokeless tobacco: Never Used  Substance and Sexual Activity  . Alcohol use: Yes    Alcohol/week: 0.0 oz    Comment: occasionally   . Drug use: No  . Sexual activity: Yes    Birth control/protection: None  Other Topics Concern  . None  Social History Narrative   Patient is married Katie Maldonado), has 2 children   Patient is right handed   Education level is 2 yrs of college   Caffeine consumption is 3-4 cups daily      Exercises at least 2-3 times a week. Walks about 5 miles a week.     Past Surgical History:  Procedure Laterality Date  . ABDOMINAL HYSTERECTOMY     Fibroids  . arm surgery left    . FRACTURE SURGERY    . TUBAL LIGATION    . WRIST SURGERY      Family History  Problem Relation Age of Onset  . Emphysema Father   . Cancer Brother        bone  . Cancer Maternal Grandmother   . Heart attack Brother   . Cancer Maternal Grandfather   . Cancer Paternal Grandmother   . Cancer Paternal Grandfather   . Irritable bowel syndrome Son     Review of Systems  Objective:  There were no vitals taken for this visit.  Physical Exam No exam data present  Assessment and Plan :  Discussed healthy lifestyle, diet, exercise, preventative care, vaccinations, and addressed patient's concerns. Plan for follow up in ***. Otherwise, plan for specific conditions  below.  There are no diagnoses linked to this encounter.  Tenna Delaine PA-C  [facility] 06/09/2017 8:33 AM

## 2017-06-09 NOTE — Patient Instructions (Addendum)
For osteoporosis, I recommend starting fosamax daily. Please take as prescribed. It is also very important to get adequate vitamin d and calcium. Will repeat bone scan in 2 years.   Dietary Considerations Ensure adequate calcium and vitamin D intake; if dietary intake is inadequate, dietary supplementation is recommended. Women and men should consume: Calcium: 1,200 mg/day (women ?81 years and men ?71 years) (IOM 2011; NOF [Cosman 2014]) Vitamin D: 800 to 1,000 int. units/day (men and women ?50 years) (NOF [Cosman 2014]). Recommended Dietary Allowance (RDA): 600 int. units daily (men and women ?70 years) or 800 int. units/day (men and women ?71 years) (IOM 2011   I scheduled you a mammogram and they should contact you with that appointment soon. I have also placed referral for physical therapy. If you do not hear from them in 2 weeks, call us.   Thank you for letting me participate in your health and well being.   Health Maintenance for Postmenopausal Women Menopause is a normal process in which your reproductive ability comes to an end. This process happens gradually over a span of months to years, usually between the ages of 53 and 42. Menopause is complete when you have missed 12 consecutive menstrual periods. It is important to talk with your health care provider about some of the most common conditions that affect postmenopausal women, such as heart disease, cancer, and bone loss (osteoporosis). Adopting a healthy lifestyle and getting preventive care can help to promote your health and wellness. Those actions can also lower your chances of developing some of these common conditions. What should I know about menopause? During menopause, you may experience a number of symptoms, such as:  Moderate-to-severe hot flashes.  Night sweats.  Decrease in sex drive.  Mood swings.  Headaches.  Tiredness.  Irritability.  Memory problems.  Insomnia.  Choosing to treat or not to treat  menopausal changes is an individual decision that you make with your health care provider. What should I know about hormone replacement therapy and supplements? Hormone therapy products are effective for treating symptoms that are associated with menopause, such as hot flashes and night sweats. Hormone replacement carries certain risks, especially as you become older. If you are thinking about using estrogen or estrogen with progestin treatments, discuss the benefits and risks with your health care provider. What should I know about heart disease and stroke? Heart disease, heart attack, and stroke become more likely as you age. This may be due, in part, to the hormonal changes that your body experiences during menopause. These can affect how your body processes dietary fats, triglycerides, and cholesterol. Heart attack and stroke are both medical emergencies. There are many things that you can do to help prevent heart disease and stroke:  Have your blood pressure checked at least every 1-2 years. High blood pressure causes heart disease and increases the risk of stroke.  If you are 5-69 years old, ask your health care provider if you should take aspirin to prevent a heart attack or a stroke.  Do not use any tobacco products, including cigarettes, chewing tobacco, or electronic cigarettes. If you need help quitting, ask your health care provider.  It is important to eat a healthy diet and maintain a healthy weight. ? Be sure to include plenty of vegetables, fruits, low-fat dairy products, and lean protein. ? Avoid eating foods that are high in solid fats, added sugars, or salt (sodium).  Get regular exercise. This is one of the most important things that you  can do for your health. ? Try to exercise for at least 150 minutes each week. The type of exercise that you do should increase your heart rate and make you sweat. This is known as moderate-intensity exercise. ? Try to do strengthening  exercises at least twice each week. Do these in addition to the moderate-intensity exercise.  Know your numbers.Ask your health care provider to check your cholesterol and your blood glucose. Continue to have your blood tested as directed by your health care provider.  What should I know about cancer screening? There are several types of cancer. Take the following steps to reduce your risk and to catch any cancer development as early as possible. Breast Cancer  Practice breast self-awareness. ? This means understanding how your breasts normally appear and feel. ? It also means doing regular breast self-exams. Let your health care provider know about any changes, no matter how small.  If you are 1 or older, have a clinician do a breast exam (clinical breast exam or CBE) every year. Depending on your age, family history, and medical history, it may be recommended that you also have a yearly breast X-ray (mammogram).  If you have a family history of breast cancer, talk with your health care provider about genetic screening.  If you are at high risk for breast cancer, talk with your health care provider about having an MRI and a mammogram every year.  Breast cancer (BRCA) gene test is recommended for women who have family members with BRCA-related cancers. Results of the assessment will determine the need for genetic counseling and BRCA1 and for BRCA2 testing. BRCA-related cancers include these types: ? Breast. This occurs in males or females. ? Ovarian. ? Tubal. This may also be called fallopian tube cancer. ? Cancer of the abdominal or pelvic lining (peritoneal cancer). ? Prostate. ? Pancreatic.  Cervical, Uterine, and Ovarian Cancer Your health care provider may recommend that you be screened regularly for cancer of the pelvic organs. These include your ovaries, uterus, and vagina. This screening involves a pelvic exam, which includes checking for microscopic changes to the surface of  your cervix (Pap test).  For women ages 21-65, health care providers may recommend a pelvic exam and a Pap test every three years. For women ages 91-65, they may recommend the Pap test and pelvic exam, combined with testing for human papilloma virus (HPV), every five years. Some types of HPV increase your risk of cervical cancer. Testing for HPV may also be done on women of any age who have unclear Pap test results.  Other health care providers may not recommend any screening for nonpregnant women who are considered low risk for pelvic cancer and have no symptoms. Ask your health care provider if a screening pelvic exam is right for you.  If you have had past treatment for cervical cancer or a condition that could lead to cancer, you need Pap tests and screening for cancer for at least 20 years after your treatment. If Pap tests have been discontinued for you, your risk factors (such as having a new sexual partner) need to be reassessed to determine if you should start having screenings again. Some women have medical problems that increase the chance of getting cervical cancer. In these cases, your health care provider may recommend that you have screening and Pap tests more often.  If you have a family history of uterine cancer or ovarian cancer, talk with your health care provider about genetic screening.  If you  have vaginal bleeding after reaching menopause, tell your health care provider.  There are currently no reliable tests available to screen for ovarian cancer.  Lung Cancer Lung cancer screening is recommended for adults 80-74 years old who are at high risk for lung cancer because of a history of smoking. A yearly low-dose CT scan of the lungs is recommended if you:  Currently smoke.  Have a history of at least 30 pack-years of smoking and you currently smoke or have quit within the past 15 years. A pack-year is smoking an average of one pack of cigarettes per day for one year.  Yearly  screening should:  Continue until it has been 15 years since you quit.  Stop if you develop a health problem that would prevent you from having lung cancer treatment.  Colorectal Cancer  This type of cancer can be detected and can often be prevented.  Routine colorectal cancer screening usually begins at age 83 and continues through age 50.  If you have risk factors for colon cancer, your health care provider may recommend that you be screened at an earlier age.  If you have a family history of colorectal cancer, talk with your health care provider about genetic screening.  Your health care provider may also recommend using home test kits to check for hidden blood in your stool.  A small camera at the end of a tube can be used to examine your colon directly (sigmoidoscopy or colonoscopy). This is done to check for the earliest forms of colorectal cancer.  Direct examination of the colon should be repeated every 5-10 years until age 51. However, if early forms of precancerous polyps or small growths are found or if you have a family history or genetic risk for colorectal cancer, you may need to be screened more often.  Skin Cancer  Check your skin from head to toe regularly.  Monitor any moles. Be sure to tell your health care provider: ? About any new moles or changes in moles, especially if there is a change in a mole's shape or color. ? If you have a mole that is larger than the size of a pencil eraser.  If any of your family members has a history of skin cancer, especially at a young age, talk with your health care provider about genetic screening.  Always use sunscreen. Apply sunscreen liberally and repeatedly throughout the day.  Whenever you are outside, protect yourself by wearing long sleeves, pants, a wide-brimmed hat, and sunglasses.  What should I know about osteoporosis? Osteoporosis is a condition in which bone destruction happens more quickly than new bone creation.  After menopause, you may be at an increased risk for osteoporosis. To help prevent osteoporosis or the bone fractures that can happen because of osteoporosis, the following is recommended:  If you are 83-34 years old, get at least 1,000 mg of calcium and at least 600 mg of vitamin D per day.  If you are older than age 14 but younger than age 45, get at least 1,200 mg of calcium and at least 600 mg of vitamin D per day.  If you are older than age 60, get at least 1,200 mg of calcium and at least 800 mg of vitamin D per day.  Smoking and excessive alcohol intake increase the risk of osteoporosis. Eat foods that are rich in calcium and vitamin D, and do weight-bearing exercises several times each week as directed by your health care provider. What should I know  about how menopause affects my mental health? Depression may occur at any age, but it is more common as you become older. Common symptoms of depression include:  Low or sad mood.  Changes in sleep patterns.  Changes in appetite or eating patterns.  Feeling an overall lack of motivation or enjoyment of activities that you previously enjoyed.  Frequent crying spells.  Talk with your health care provider if you think that you are experiencing depression. What should I know about immunizations? It is important that you get and maintain your immunizations. These include:  Tetanus, diphtheria, and pertussis (Tdap) booster vaccine.  Influenza every year before the flu season begins.  Pneumonia vaccine.  Shingles vaccine.  Your health care provider may also recommend other immunizations. This information is not intended to replace advice given to you by your health care provider. Make sure you discuss any questions you have with your health care provider. Document Released: 06/07/2005 Document Revised: 11/03/2015 Document Reviewed: 01/17/2015 Elsevier Interactive Patient Education  2018 [employer].   Alendronate tablets What  is this medicine? ALENDRONATE (a LEN droe nate) slows calcium loss from bones. It helps to make normal healthy bone and to slow bone loss in people with Paget's disease and osteoporosis. It may be used in others at risk for bone loss. This medicine may be used for other purposes; ask your health care provider or pharmacist if you have questions. COMMON BRAND NAME(S): Fosamax What should I tell my health care provider before I take this medicine? They need to know if you have any of these conditions: -dental disease -esophagus, stomach, or intestine problems, like acid reflux or GERD -kidney disease -low blood calcium -low vitamin D -problems sitting or standing 30 minutes -trouble swallowing -an unusual or allergic reaction to alendronate, other medicines, foods, dyes, or preservatives -pregnant or trying to get pregnant -breast-feeding How should I use this medicine? You must take this medicine exactly as directed or you will lower the amount of the medicine you absorb into your body or you may cause yourself harm. Take this medicine by mouth first thing in the morning, after you are up for the day. Do not eat or drink anything before you take your medicine. Swallow the tablet with a full glass (6 to 8 fluid ounces) of plain water. Do not take this medicine with any other drink. Do not chew or crush the tablet. After taking this medicine, do not eat breakfast, drink, or take any medicines or vitamins for at least 30 minutes. Sit or stand up for at least 30 minutes after you take this medicine; do not lie down. Do not take your medicine more often than directed. Talk to your pediatrician regarding the use of this medicine in children. Special care may be needed. Overdosage: If you think you have taken too much of this medicine contact a poison control center or emergency room at once. NOTE: This medicine is only for you. Do not share this medicine with others. What if I miss a dose? If you miss  a dose, do not take it later in the day. Continue your normal schedule starting the next morning. Do not take double or extra doses. What may interact with this medicine? -aluminum hydroxide -antacids -aspirin -calcium supplements -drugs for inflammation like ibuprofen, naproxen, and others -iron supplements -magnesium supplements -vitamins with minerals This list may not describe all possible interactions. Give your health care provider a list of all the medicines, herbs, non-prescription drugs, or dietary supplements you  use. Also tell them if you smoke, drink alcohol, or use illegal drugs. Some items may interact with your medicine. What should I watch for while using this medicine? Visit your doctor or health care professional for regular checks ups. It may be some time before you see benefit from this medicine. Do not stop taking your medicine except on your doctor's advice. Your doctor or health care professional may order blood tests and other tests to see how you are doing. You should make sure you get enough calcium and vitamin D while you are taking this medicine, unless your doctor tells you not to. Discuss the foods you eat and the vitamins you take with your health care professional. Some people who take this medicine have severe bone, joint, and/or muscle pain. This medicine may also increase your risk for a broken thigh bone. Tell your doctor right away if you have pain in your upper leg or groin. Tell your doctor if you have any pain that does not go away or that gets worse. This medicine can make you more sensitive to the sun. If you get a rash while taking this medicine, sunlight may cause the rash to get worse. Keep out of the sun. If you cannot avoid being in the sun, wear protective clothing and use sunscreen. Do not use sun lamps or tanning beds/booths. What side effects may I notice from receiving this medicine? Side effects that you should report to your doctor or health care  professional as soon as possible: -allergic reactions like skin rash, itching or hives, swelling of the face, lips, or tongue -black or tarry stools -bone, muscle or joint pain -changes in vision -chest pain -heartburn or stomach pain -jaw pain, especially after dental work -pain or trouble when swallowing -redness, blistering, peeling or loosening of the skin, including inside the mouth Side effects that usually do not require medical attention (report to your doctor or health care professional if they continue or are bothersome): -changes in taste -diarrhea or constipation -eye pain or itching -headache -nausea or vomiting -stomach gas or fullness This list may not describe all possible side effects. Call your doctor for medical advice about side effects. You may report side effects to FDA at 1-800-FDA-1088. Where should I keep my medicine? Keep out of the reach of children. Store at room temperature of 15 and 30 degrees C (59 and 86 degrees F). Throw away any unused medicine after the expiration date. NOTE: This sheet is a summary. It may not cover all possible information. If you have questions about this medicine, talk to your doctor, pharmacist, or health care provider.  2018 Elsevier/Gold Standard (2010-10-12 08:56:09)       IF you received an x-ray today, you will receive an invoice from [facility]. Please contact [facility] at [phone] with questions or concerns regarding your invoice.   IF you received labwork today, you will receive an invoice from [facility]. Please contact [facility] at [phone] with questions or concerns regarding your invoice.   Our billing staff will not be able to assist you with questions regarding bills from these companies.  You will be contacted with the lab results as soon as they are available. The fastest way to get your results is to activate your My Chart account. Instructions are located on the last page of this  paperwork. If you have not heard from [place] regarding the results in 2 weeks, please contact this office.

## 2017-06-16 NOTE — Addendum Note (Signed)
Addended by: Lanna Poche R on: 06/16/2017 03:39 PM   Modules accepted: Orders

## 2017-06-18 ENCOUNTER — Encounter: Payer: Self-pay | Admitting: Diagnostic Neuroimaging

## 2017-06-18 ENCOUNTER — Ambulatory Visit: Payer: 59 | Admitting: Diagnostic Neuroimaging

## 2017-06-18 VITALS — BP 120/76 | HR 74 | Wt 157.0 lb

## 2017-06-18 DIAGNOSIS — R5383 Other fatigue: Secondary | ICD-10-CM

## 2017-06-18 DIAGNOSIS — R41840 Attention and concentration deficit: Secondary | ICD-10-CM

## 2017-06-18 DIAGNOSIS — G35 Multiple sclerosis: Secondary | ICD-10-CM | POA: Diagnosis not present

## 2017-06-18 MED ORDER — AMPHETAMINE-DEXTROAMPHET ER 20 MG PO CP24: 20.0000 mg | ORAL_CAPSULE | Freq: Every day | ORAL | 0 refills | Status: DC

## 2017-06-18 NOTE — Progress Notes (Signed)
PATIENT: Katie Maldonado DOB: 20-Sep-1950   REASON FOR VISIT: follow up for MS HISTORY FROM: patient  Chief Complaint  Patient presents with  . Multiple Sclerosis    rm 7, "need Adderall refill; having strange headaches; red, flushing feeling from head down; sometimes lean to left when walking"  . Follow-up    6 month     HISTORY OF PRESENT ILLNESS:  UPDATE (06/18/17, VRP): Since last visit, doing about the same. Tolerating meds. No alleviating or aggravating factors.   UPDATE 12/16/16: Since last visit, doing about the same. Still with fatigue. Tolerating tecfidera. Denies depression. Sleep is stable.   UPDATE 06/18/16: Since last visit, doing better. Better energy and concentration. PT helping with back pain.   UPDATE 03/06/16: Since last visit, continues with fatigue, poor attention, difficulty at work. She is in corrective action due to errors. Denies depression, but has excessive daytime fatigue.   UPDATE 08/31/15: Since last visit continues with fatigue, pain, numbness. Tolerating tecfidera. Has tried PT. Urinary issues better. Now recovering from right arm celluitis (was painting, developed blister, then got infected; now better on oral antibiotics).  UPDATE 07/07/15: Since last visit, still with left neck pain radiating to the left arm. Numb/tingling. Has h/o left forearm/wrist fracture (2005 and 2010; s/p surgery). Amantadine not helping with fatigue. Denies depression.   UPDATE 05/31/15: Since last visit, sxs progressing since Oct 2016. More fatigue, more bladder/bowel issues. More left arm numbness.  UPDATE 11/28/14: Since last visit, continues to have prickly, tingling sensation in whole body, hot flash sensations, pain, neck issues. Had 2 falls, but no injuries.  UPDATE 08/23/14: Since last visit, has been on tecfidera since beginning March 2016. She has noted more general numbness, fatigue, constipation, drawstring sensation in calves/hamstrings in past 3 weeks.    UPDATE 05/18/14: MS symptoms stable. Has had 2 reactions to copaxone, leading to tinging, prickly sensations over whole body. 1 event led to throat tightness, tongue feeling thick, and worried patient. Here to discuss possibilities of switching therapies.  UPDATE 01/04/14 (VRP): Since last visit, doing the same. No new neuro symptoms. Does report persistent fatigue, weight gain, incr appetite, feeling hot. Tolerating copaxone. Misses 2-3 injections per month.   UPDATE 06/30/13 (LL):  Since last visit patient continues to have problems with fatigue and malaise, numbness and paresthesias in left arm which is new for her, started a couple weeks ago.  She states it is constant, does not hurt, but annoying.  She is still able to work.  Follow up MRI brain at last visit showed no acute or new lesions, with moderate cerebral atrophy.  UPDATE 12/30/12 (VP): Since last visit patient continues to have problems with fatigue, insomnia, anxiety, shortness of breath. For past 6 months she's been having dyspnea on exertion especially climbing steps at her work. She has been evaluated by PCP without specific diagnosis. Patient continues on Copaxone (now 3 times per week dosing). Last MRI brain was in 2011. Regarding fatigue she has been on Provigil and Ritalin in the past without relief. Regarding anxiety she was on Paxil in the past but this caused nausea and stomach problems.   PRIOR HPI (05/02/12, Dr. 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 Her MRI of brain 06/10/1999 showed white matter abnormalities not typical for MS and MRI of the cervical spine without contrast 07/27/99 showed a focal area of abnormality in the spinal  cord at C2-3. She had positive CSF IgG index and oligoclonal banding. Initially the VER was normal with subsequent VERs 12/04/1999, 12/10/1999, and 02/01/2000 abnormal in the right eye. She  began Avonex which she took for one year and switched to Rebif beginning 08/2000. She was then switched to Betaseron 04/2001 because her insurance would not pay for Rebif, but was subsequently switched back to Rebif 04/2003. Because of elevated liver function tests she was switched to Copaxone 07/03/2005.She has felt better on Copaxone then on the interferons. Her visual acuity varies from 20/30 to 20/40 in the left eye and to 20/200 in the right. She had burning paresthesias treated with gabapentin and at times amitriptyline. NMO blood test was negative 02/21/2005. She has been followed Dr. Estanislado Pandy, rheumatologist for the question of RA versus sarcoidosis and by Dr.Clinton Young for hypersomnolence without a diagnosis of narcolepsy or obstructive Sleep apnea. MRI studies of the brain and cervical spine 02/08/2010 showed periventricular lesions and unchanged lesion at C2-3. CBC and CMP 03/17/2009 were normal. CBC, CMP, TSH, lipid profile except LDL 151 were normal 08/07/09.vitamin D level 10/16/2009 was 41. She complains of fatigue and her right foot turning in an occasional foot cramping. She fell 10/2009 fracturing her right foot in a [employer] parking lot. In 2010 she fractured her left foot, left wrist, and her left arm. She has continued swelling in her right foot and lower leg. She has an overactive bladder followed by Dr.Dalhstedt. She has a history of Lhermitte's sign worse with neck extension but also present with flexion. She had numbness on the right side of her body except her face and head for one month.Her bladder symptoms improved on enablex.She walks 1.8 miles 3 times per week. 03/05/2011 she was lying in bed asleep and awoke trying to sit up and developed dizziness on 2 different occasions. She fell back in to the bed and went to sleep. On the third episode she became concerned. The episodes of dizziness would last seconds and are described as spinning. She had dizziness during the day without spinning She  did not have head trauma or a cold. She was seen by Dr. Elder Cyphers at urgent care and underwent blood studies, EKG, and urinalysis. She has a history of migraine and a history of motion sickness. She has a Lhermitte's sign when she turns her head to the left with discomfort in her neck going into her left arm. This can occur 2 times per day and lasts Less than 1 minute. She has bladder symptoms and right flank pain and is concerned about another urinary tract infection. She can awaken with snoring when she has a cold. She sleeps well. She has dizziness 2 or 3 times per week when she lies down and turns her head to the left.    REVIEW OF SYSTEMS: Full 14 system review of systems performed and negative except: dizziness numbness back pain fatigue double vision itching freq urination.   ALLERGIES: Allergies  Allergen Reactions  . Nitrofurantoin Shortness Of Breath    Shortness of breath, violent chills, dizziness, nausea  . Sulfonamide Derivatives Shortness Of Breath    Shortness of breath, chills  . Betaseron [Interferon Beta-1b]     Shortness of breath, rash, heart palpatations  . Cefdinir     Can't remember what rx had 2 yrs ago  . Copaxone [Glatiramer Acetate]     HOME MEDICATIONS: Outpatient Medications Prior to Visit  Medication Sig Dispense Refill  . alendronate (FOSAMAX) 10 MG tablet Take 1  tablet (10 mg total) by mouth daily before breakfast. Take with a full glass of water on an empty stomach. Avoid lying down x 30 min. 90 tablet 3  . calcium acetate (PHOSLO) 667 MG capsule Take by mouth 3 (three) times daily with meals.    . Cholecalciferol (VITAMIN D3) 5000 units CAPS Take by mouth daily.    . citalopram (CELEXA) 20 MG tablet Take 1 tablet (20 mg total) by mouth daily. 90 tablet 3  . diclofenac sodium (VOLTAREN) 1 % GEL Place 1 % onto the skin.    . Dimethyl Fumarate (TECFIDERA) 240 MG CPDR Take 1 capsule (240 mg total) by mouth 2 (two) times daily. 180 capsule 1  . ibuprofen  (ADVIL,MOTRIN) 200 MG tablet Take 2 tablets (400 mg total) by mouth 4 (four) times daily.    . Omega-3 Fatty Acids (FISH OIL) 1000 MG CAPS Take by mouth daily.    Marland Kitchen omeprazole (PRILOSEC) 20 MG capsule Take 1 capsule (20 mg total) by mouth daily. 90 capsule 3  . ondansetron (ZOFRAN-ODT) 4 MG disintegrating tablet Take 1 tablet (4 mg total) every 8 (eight) hours as needed by mouth for nausea or vomiting. DISSOLVE 1 TABLET BY MOUTH EVERY 8 HOURS AS NEEDED FOR NAUSEA AND VOMITING 60 tablet 0  . amphetamine-dextroamphetamine (ADDERALL XR) 20 MG 24 hr capsule Take 1 capsule (20 mg total) by mouth daily. (Patient not taking: Reported on 06/18/2017) 30 capsule 0   Facility-Administered Medications Prior to Visit  Medication Dose Route Frequency Provider Last Rate Last Dose  . gadopentetate dimeglumine (MAGNEVIST) injection 15 mL  15 mL Intravenous Once PRN Neysha Criado R, MD      . gadopentetate dimeglumine (MAGNEVIST) injection 15 mL  15 mL Intravenous Once PRN Shanitha Twining R, MD      . gadopentetate dimeglumine (MAGNEVIST) injection 16 mL  16 mL Intravenous Once PRN Emeril Stille R, MD       PHYSICAL EXAM  Vitals:   06/18/17 1354  BP: 120/76  Pulse: 74  Weight: 157 lb (71.2 kg)   Body mass index is 26.64 kg/m.  GENERAL EXAM/CONSTITUTIONAL: Vitals:  Vitals:   06/18/17 1354  BP: 120/76  Pulse: 74  Weight: 157 lb (71.2 kg)    Patient is in no distress; well developed, nourished and groomed; neck is supple  CARDIOVASCULAR:  Examination of carotid arteries is normal; no carotid bruits  Regular rate and rhythm, no murmurs  Examination of peripheral vascular system by observation and palpation is normal  EYES:  Ophthalmoscopic exam of optic discs and posterior segments is normal; no papilledema or hemorrhages  MUSCULOSKELETAL:  Gait, strength, tone, movements noted in Neurologic exam below  NEUROLOGIC: MENTAL STATUS:   awake, alert, oriented to person, place and  time  recent and remote memory intact  normal attention and concentration  language fluent, comprehension intact, naming intact,   fund of knowledge appropriate  CRANIAL NERVE:   2nd - no papilledema on fundoscopic exam  2nd, 3rd, 4th, 6th - pupils equal and reactive to light, visual fields full to confrontation, extraocular muscles intact, no nystagmus  5th - facial sensation symmetric  7th - facial strength symmetric  8th - hearing intact  9th - palate elevates symmetrically, uvula midline  11th - shoulder shrug symmetric  12th - tongue protrusion midline  MOTOR:   normal bulk and tone, full strength in the BUE, BLE   SENSORY:   normal and symmetric to light touch, temperature, vibration  COORDINATION:  finger-nose-finger, fine finger movements normal  REFLEXES:   deep tendon reflexes present and symmetric  GAIT/STATION:   narrow based gait    DIAGNOSTIC DATA (LABS, IMAGING, TESTING) - I reviewed patient records, labs, notes, testing and imaging myself where available.  Lab Results  Component Value Date   WBC 4.4 06/05/2017   HGB 12.7 06/05/2017   HCT 38.8 06/05/2017   MCV 88 06/05/2017   PLT 291 06/05/2017   Lymphocytes Absolute  Date Value Ref Range Status  06/05/2017 0.8 0.7 - 3.1 x10E3/uL Final  06/05/2016 0.7 0.7 - 3.1 x10E3/uL Final  12/05/2015 0.8 0.7 - 3.1 x10E3/uL Final   Lymphs Abs  Date Value Ref Range Status  03/12/2016 559 (L) 850 - 3,900 cells/uL Final   CMP Latest Ref Rng & Units 06/05/2017 07/29/2016 06/05/2016  Glucose 65 - 99 mg/dL 90 93 80  BUN 8 - 27 mg/dL 15 17 9   Creatinine 0.57 - 1.00 mg/dL 0.83 0.77 0.73  Sodium 134 - 144 mmol/L 139 137 141  Potassium 3.5 - 5.2 mmol/L 4.5 4.1 5.1  Chloride 96 - 106 mmol/L 100 98 100  CO2 20 - 29 mmol/L 26 22 23   Calcium 8.7 - 10.3 mg/dL 9.5 8.3(L) 9.5  Total Protein 6.0 - 8.5 g/dL 6.7 6.5 7.0  Total Bilirubin 0.0 - 1.2 mg/dL 0.7 0.9 0.6  Alkaline Phos 39 - 117 IU/L 91 91 78  AST 0  - 40 IU/L 20 36 23  ALT 0 - 32 IU/L 18 29 21    Lab Results  Component Value Date   TSH 2.010 06/05/2017    08/31/14 MRI brain - showing T2/flair hyperintense foci in the periventricular, deep and subcortical matter in a pattern and configuration consistent with the diagnosis of multiple sclerosis. No acute foci are noted. Generalized cortical atrophy is noted. When compared to a study dated 05/26/2014, there is no interval change.  08/31/14 MRI cervical spine -  1. Severe degenerative changes at C4-C5, C5-C6 and C6-C7 with there is disc protrusion, uncovertebral spurring and facet hypertrophy. There is mild spinal stenosis at each of these 3 levels. There is also moderately severe foraminal narrowing which could lead to impingement of the exiting C5, C6 and C7 nerve roots. 2. Degenerative changes are milder at C2-C3, C3-C4 and C7-T1 and are less likely to lead to nerve root impingement.  3. The spinal cord appears normal.  4. There are no enhancing abnormalities.  5. Compared to the MRI of the cervical spine dated 07/07/2013, there is no definite interval change.   06/14/15 MRI brain  1. Mild periventricular and subcortical and infratentorial chronic demyelinating plaques.  2. No abnormal lesions are seen on post contrast views.  3. No change from MRI on 08/31/14.   06/28/15 MRI cervical  - severe degenerative changes at C4-5, C5-6 and C6-7 resulting in broad-based disc protrusions, uncovertebral spurring and facet hypertrophy resulting in mild canal and moderate bilateral foraminal narrowing with likely encroachment on the exiting C5, C6 and C7 nerve roots. No enhancing or demyelinating lesions are noted. Overall no significant change compared with previous MRI scan dated 08/31/2014.  01/01/17 MRI brain [I reviewed images myself and agree with interpretation. -VRP]  1. Mild periventricular and subcortical and infratentorial chronic demyelinating plaques.  2. No abnormal lesions are seen  on post contrast views.  3. No change from MRI on 06/14/15.  08/31/15 anti-JCV antibody - 1.71 high/positive    ASSESSMENT AND PLAN  67 y.o. female here with multiple sclerosis since  2001, on copaxone since 2007. Previously on avonex and betaseron.  Then apparent intolerance to copaxone with 2 events of tingling, throat tightness, thick tongue with 2 injections. Now on tecfidera since March 2016. Some progression of symptoms since Oct 2016, but MRI scans are stable.  Left neck and arm symptoms likely related to cervical radiculopathies. Will try conservative treatment options.  Tried gabapentin for numbness --> stopped due to sleepiness.  Tried ritalin, provigil, amantadine for fatigue --> stopped due to in-effectiveness.   Dx:  Multiple sclerosis ([place])  Other fatigue  Attention and concentration deficit    PLAN:  MULTIPLE SCLEROSIS - continue tecfidera - continue gentle yoga classes and PT exercises - continue adderall for fatigue and poor attention  Meds ordered this encounter  Medications  . amphetamine-dextroamphetamine (ADDERALL XR) 20 MG 24 hr capsule    Sig: Take 1 capsule (20 mg total) by mouth daily.    Dispense:  30 capsule    Refill:  0   Return in about 6 months (around 12/16/2017).    Penni Bombard, MD 10/23/348, 0:93 PM Certified in Neurology, Neurophysiology and Neuroimaging  [facility] [address] [place], [place] [address] [phone]

## 2017-06-19 MED FILL — ADDERALL XR 20 MG CAP SA: 20 | 30 days supply | Qty: 30 | Fill #0

## 2017-06-20 ENCOUNTER — Encounter: Payer: Self-pay | Admitting: Rheumatology

## 2017-06-20 ENCOUNTER — Encounter: Payer: Self-pay | Admitting: Physician Assistant

## 2017-06-20 NOTE — Telephone Encounter (Signed)
Attempted to contact the patient and left message for patient to call the office.  

## 2017-06-23 NOTE — Progress Notes (Addendum)
Office Visit Note  Patient: Katie Maldonado             Date of Birth: [birth year]           MRN: [id number]             PCP: Leonie Douglas, PA-C Referring: Leonie Douglas, PA* Visit Date: 06/26/2017 Occupation: @GUAROCC @    Subjective:  Right hand pain    History of Present Illness: Katie Maldonado is a 66 y.o. female with history of osteoarthritis, DDD, and osteoporosis.  Patient states she is having increased pain in her right hand and right foot about 1 month ago.  She states she has noticed swelling her right hand and right ankle.  She states she has noticed a nodule on right 3rd knuckle.  She is also having tenderness of her right trochanteric bursa.  She states she has been taking  Ibuprofen 200 mg 12-16 tablets a day.  She states she continues to have morning stiffness in her joints lasting about 1 hour.  She has some neck stiffness.  She is very concerned about her right hand.  She had a DEXA on 04/23/17.  She has been on Fosamax for awhile.  She states she has fractured her left forearm twice after being diagnosed with osteoporosis.  She states her bone density is worsening but she has not been taking her Fosamax as prescribed because she would forget doses when it was only once a week.  She was switched to Fosamax 10 mg daily.  She also continues to take calcium and vitamin D.   She sees her neurologist every 3-6 months for management of her MS.   Activities of Daily Living:  Patient reports morning stiffness for 1  hour.   Patient Denies nocturnal pain.  Difficulty dressing/grooming: Denies Difficulty climbing stairs: Reports Difficulty getting out of chair: Denies Difficulty using hands for taps, buttons, cutlery, and/or writing: Reports   Review of Systems  Constitutional: Positive for fatigue. Negative for weakness.  HENT: Negative for mouth sores, trouble swallowing, trouble swallowing, mouth dryness and nose dryness.   Eyes: Negative for  photophobia, redness and dryness.  Respiratory: Negative for cough, hemoptysis, shortness of breath and difficulty breathing.   Cardiovascular: Negative for chest pain, palpitations, hypertension, irregular heartbeat and swelling in legs/feet.  Gastrointestinal: Negative for blood in stool, constipation and diarrhea.  Endocrine: Negative for increased urination.  Genitourinary: Negative for painful urination.  Musculoskeletal: Positive for arthralgias, joint pain, joint swelling, myalgias, morning stiffness and myalgias. Negative for muscle weakness and muscle tenderness.  Skin: Positive for nodules/bumps. Negative for color change, pallor, rash, hair loss, redness, skin tightness, ulcers and sensitivity to sunlight.  Allergic/Immunologic: Negative for susceptible to infections.  Neurological: Positive for light-headedness and headaches. Negative for dizziness and numbness.  Hematological: Negative for swollen glands.  Psychiatric/Behavioral: Negative for depressed mood and sleep disturbance. The patient is nervous/anxious.     PMFS History:  Patient Active Problem List   Diagnosis Date Noted  . Osteoarthritis of lumbar spine 10/22/2016  . Primary osteoarthritis of both feet 03/09/2016  . Primary osteoarthritis of both knees 03/09/2016  . Degenerative cervical disc 10/04/2014  . Primary osteoarthritis of both hands 08/19/2014  . Osteoporosis 11/13/2012  . Incontinence of urine 04/18/2011  . HYPERLIPIDEMIA-MIXED 01/08/2010  . MULTIPLE SCLEROSIS 01/08/2010    Past Medical History:  Diagnosis Date  . Allergy   . Anxiety   . Arthritis   . Diverticulitis   . Diverticulosis  10/2015  . Hypersomnolence   . Migraine   . Multiple sclerosis ([place])   . Neuromuscular disorder ([place])   . Obesity, unspecified   . Osteoporosis   . Other and unspecified hyperlipidemia   . TIA (transient ischemic attack) 22    Family History  Problem Relation Age of Onset  . Emphysema Father   . Cancer  Brother        bone  . Cancer Maternal Grandmother   . Heart attack Brother   . Cancer Maternal Grandfather   . Cancer Paternal Grandmother   . Cancer Paternal Grandfather   . Irritable bowel syndrome Son    Past Surgical History:  Procedure Laterality Date  . ABDOMINAL HYSTERECTOMY     Fibroids  . arm surgery left    . FRACTURE SURGERY    . TUBAL LIGATION    . WRIST SURGERY     Social History   Social History Narrative   Patient is married Katie Maldonado), has 2 children   Patient is right handed   Education level is 2 yrs of college   Caffeine consumption is 3-4 cups daily     Objective: Vital Signs: BP 111/75 (BP Location: Left Arm, Patient Position: Sitting, Cuff Size: Normal)   Pulse 83   Resp 14   Ht 5' 4.5" (1.638 m)   Wt 166 lb (75.3 kg)   BMI 28.05 kg/m    Physical Exam  Constitutional: She is oriented to person, place, and time. She appears well-developed and well-nourished.  HENT:  Head: Normocephalic and atraumatic.  Eyes: Conjunctivae and EOM are normal.  Neck: Normal range of motion.  Cardiovascular: Normal rate, regular rhythm, normal heart sounds and intact distal pulses.  Pulmonary/Chest: Effort normal and breath sounds normal.  Abdominal: Soft. Bowel sounds are normal.  Lymphadenopathy:    She has no cervical adenopathy.  Neurological: She is alert and oriented to person, place, and time.  Skin: Skin is warm and dry. Capillary refill takes less than 2 seconds.  Psychiatric: She has a normal mood and affect. Her behavior is normal.  Nursing note and vitals reviewed.    Musculoskeletal Exam: C-spine limited lateral rotation.  Thoracic and lumbar spine good ROM.  No midline spinal tenderness.  No SI joint tenderness. Shoulder joints, elbow joints, wrist joints, MCPs, PIPs, and DIPs good ROM with no synovitis.  She has tenderness of her right 2nd and 3rd MCPs.  Hip joints, knee joints, ankle joints, MTPs, PIPs, and DIPs good ROM.  No warmth or effusion of  knees.  She has some knee crepitus bilaterally.  She has no tenderness of MTPs.  She has PIP and DIP synovial thickening consistent with osteoarthritis.  She has tenderness of her right trochanteric bursa.  CDAI Exam: No CDAI exam completed.    Investigation: No additional findings. CBC Latest Ref Rng & Units 06/05/2017 07/29/2016 06/05/2016  WBC 3.4 - 10.8 x10E3/uL 4.4 3.3(A) 5.0  Hemoglobin 11.1 - 15.9 g/dL 12.7 12.3 12.4  Hematocrit 34.0 - 46.6 % 38.8 36.6(A) 37.1  Platelets 150 - 379 x10E3/uL 291 - 257   CMP Latest Ref Rng & Units 06/05/2017 07/29/2016 06/05/2016  Glucose 65 - 99 mg/dL 90 93 80  BUN 8 - 27 mg/dL 15 17 9   Creatinine 0.57 - 1.00 mg/dL 0.83 0.77 0.73  Sodium 134 - 144 mmol/L 139 137 141  Potassium 3.5 - 5.2 mmol/L 4.5 4.1 5.1  Chloride 96 - 106 mmol/L 100 98 100  CO2 20 - 29 mmol/L  26 22 23   Calcium 8.7 - 10.3 mg/dL 9.5 8.3(L) 9.5  Total Protein 6.0 - 8.5 g/dL 6.7 6.5 7.0  Total Bilirubin 0.0 - 1.2 mg/dL 0.7 0.9 0.6  Alkaline Phos 39 - 117 IU/L 91 91 78  AST 0 - 40 IU/L 20 36 23  ALT 0 - 32 IU/L 18 29 21     Imaging: Xr Hand 2 View Right  Result Date: 06/26/2017 No MCP joint narrowing was noted.  No erosive changes were noted.  She has severe CMC narrowing and PIP DIP narrowing.  No intercarpal radiocarpal joint space narrowing was noted. Impression: These findings are consistent with osteoarthritis of the hand.   Speciality Comments: No specialty comments available.    Procedures:  No procedures performed Allergies: Nitrofurantoin; Sulfonamide derivatives; Betaseron [interferon beta-1b]; Cefdinir; and Copaxone [glatiramer acetate]   Assessment / Plan:     Visit Diagnoses: Primary osteoarthritis of both hands: She has PIP and DIP synovial thickening consistent with osteoarthritis.  No synovitis on exam.  Joint protection and muscle strengthening were discussed.  She was given a handout of hand exercises she can perform.    Pain in right hand - She has been having  increased pain in her right hand for the past 1 month.  She has tenderness of her 2nd and 3rd MCPs.  She has no synovitis.  X-ray of her right hand were obtained today. Right hand x-ray revealed CMC joint space narrowing.  No MCP joint space narrowing or erosions. CCP, RF, 14-3-3 eta and uric acid were obtained today. Plan: XR Hand 2 View Right, Rheumatoid factor, Cyclic citrul peptide antibody, IgG, 14-3-3 eta Protein, Uric acid   Primary osteoarthritis of both knees: She has bilateral knee crepitus.  She has good ROM bilaterally.  No warmth or effusion.  She has no discomfort at this time.   Primary osteoarthritis of both feet: She has PIP and DIP synovial thickening consistent with osteoarthritis of the feet.  We discussed the importance of proper fitting shoes.   DDD (degenerative disc disease), cervical: She has stiffness with lateral rotation.  She has occasional discomfort.   Age-related osteoporosis without current pathological fracture - Fosamax 10 mg daily.  She has a DEXA on 04/23/17. Her T-score is -3.4.  She may benefit from starting Forteo due to her history of pathologic fractures.    History of vitamin D deficiency - She is on vitamin D and calcium daily.   History of multiple sclerosis - She is followed by her neurologist every 3-6 months.   Other medical conditions are listed as follows:   History of anxiety  History of fatigue     Orders: Orders Placed This Encounter  Procedures  . XR Hand 2 View Right  . Rheumatoid factor  . Cyclic citrul peptide antibody, IgG  . 14-3-3 eta Protein  . Uric acid   No orders of the defined types were placed in this encounter.   Face-to-face time spent with patient was 30 minutes.Greater than 50% of time was spent in counseling and coordination of care.  Follow-Up Instructions: Return in about 5 months (around 11/23/2017) for Osteoarthritis, Osteoporosis.   Bo Merino, MD  Note - This record has been created using  Editor, commissioning.  Chart creation errors have been sought, but may not always  have been located. Such creation errors do not reflect on  the standard of medical care.

## 2017-06-26 ENCOUNTER — Encounter: Payer: Self-pay | Admitting: Physician Assistant

## 2017-06-26 ENCOUNTER — Ambulatory Visit (INDEPENDENT_AMBULATORY_CARE_PROVIDER_SITE_OTHER): Payer: 59

## 2017-06-26 ENCOUNTER — Other Ambulatory Visit: Payer: Self-pay | Admitting: Physician Assistant

## 2017-06-26 ENCOUNTER — Ambulatory Visit: Payer: 59 | Admitting: Physician Assistant

## 2017-06-26 VITALS — BP 111/75 | HR 83 | Resp 14 | Ht 64.5 in | Wt 166.0 lb

## 2017-06-26 DIAGNOSIS — M19071 Primary osteoarthritis, right ankle and foot: Secondary | ICD-10-CM

## 2017-06-26 DIAGNOSIS — M170 Bilateral primary osteoarthritis of knee: Secondary | ICD-10-CM | POA: Diagnosis not present

## 2017-06-26 DIAGNOSIS — M810 Age-related osteoporosis without current pathological fracture: Secondary | ICD-10-CM | POA: Diagnosis not present

## 2017-06-26 DIAGNOSIS — G35 Multiple sclerosis: Secondary | ICD-10-CM

## 2017-06-26 DIAGNOSIS — M79641 Pain in right hand: Secondary | ICD-10-CM | POA: Diagnosis not present

## 2017-06-26 DIAGNOSIS — M5030 Other cervical disc degeneration, unspecified cervical region: Secondary | ICD-10-CM

## 2017-06-26 DIAGNOSIS — Z8669 Personal history of other diseases of the nervous system and sense organs: Secondary | ICD-10-CM | POA: Diagnosis not present

## 2017-06-26 DIAGNOSIS — M19042 Primary osteoarthritis, left hand: Secondary | ICD-10-CM

## 2017-06-26 DIAGNOSIS — M7061 Trochanteric bursitis, right hip: Secondary | ICD-10-CM

## 2017-06-26 DIAGNOSIS — Z87898 Personal history of other specified conditions: Secondary | ICD-10-CM | POA: Diagnosis not present

## 2017-06-26 DIAGNOSIS — Z8659 Personal history of other mental and behavioral disorders: Secondary | ICD-10-CM

## 2017-06-26 DIAGNOSIS — M19041 Primary osteoarthritis, right hand: Secondary | ICD-10-CM | POA: Diagnosis not present

## 2017-06-26 DIAGNOSIS — M19072 Primary osteoarthritis, left ankle and foot: Secondary | ICD-10-CM

## 2017-06-26 DIAGNOSIS — Z8639 Personal history of other endocrine, nutritional and metabolic disease: Secondary | ICD-10-CM

## 2017-06-26 NOTE — Progress Notes (Signed)
Opened in error

## 2017-06-26 NOTE — Patient Instructions (Addendum)
Trochanteric Bursitis Rehab Ask your health care provider which exercises are safe for you. Do exercises exactly as told by your health care provider and adjust them as directed. It is normal to feel mild stretching, pulling, tightness, or discomfort as you do these exercises, but you should stop right away if you feel sudden pain or your pain gets worse.Do not begin these exercises until told by your health care provider. Stretching exercises These exercises warm up your muscles and joints and improve the movement and flexibility of your hip. These exercises also help to relieve pain and stiffness. Exercise A: Iliotibial band stretch  1. Lie on your side with your left / right leg in the top position. 2. Bend your left / right knee and grab your ankle. 3. Slowly bring your knee back so your thigh is behind your body. 4. Slowly lower your knee toward the floor until you feel a gentle stretch on the outside of your left / right thigh. If you do not feel a stretch and your knee will not fall farther, place the heel of your other foot on top of your outer knee and pull your thigh down farther. 5. Hold this position for __________ seconds. 6. Slowly return to the starting position. Repeat __________ times. Complete this exercise __________ times a day. Strengthening exercises These exercises build strength and endurance in your hip and pelvis. Endurance is the ability to use your muscles for a long time, even after they get tired. Exercise B: Bridge ( hip extensors) 1. Lie on your back on a firm surface with your knees bent and your feet flat on the floor. 2. Tighten your buttocks muscles and lift your buttocks off the floor until your trunk is level with your thighs. You should feel the muscles working in your buttocks and the back of your thighs. If this exercise is too easy, try doing it with your arms crossed over your chest. 3. Hold this position for __________ seconds. 4. Slowly return to the  starting position. 5. Let your muscles relax completely between repetitions. Repeat __________ times. Complete this exercise __________ times a day. Exercise C: Squats ( knee extensors and  quadriceps) 1. Stand in front of a table, with your feet and knees pointing straight ahead. You may rest your hands on the table for balance but not for support. 2. Slowly bend your knees and lower your hips like you are going to sit in a chair. ? Keep your weight over your heels, not over your toes. ? Keep your lower legs upright so they are parallel with the table legs. ? Do not let your hips go lower than your knees. ? Do not bend lower than told by your health care provider. ? If your hip pain increases, do not bend as low. 3. Hold this position for __________ seconds. 4. Slowly push with your legs to return to standing. Do not use your hands to pull yourself to standing. Repeat __________ times. Complete this exercise __________ times a day. Exercise D: Hip hike 1. Stand sideways on a bottom step. Stand on your left / right leg with your other foot unsupported next to the step. You can hold onto the railing or wall if needed for balance. 2. Keeping your knees straight and your torso square, lift your left / right hip up toward the ceiling. 3. Hold this position for __________ seconds. 4. Slowly let your left / right hip lower toward the floor, past the starting position. Your foot   should get closer to the floor. Do not lean or bend your knees. Repeat __________ times. Complete this exercise __________ times a day. Exercise E: Single leg stand 1. Stand near a counter or door frame that you can hold onto for balance as needed. It is helpful to stand in front of a mirror for this exercise so you can watch your hip. 2. Squeeze your left / right buttock muscles then lift up your other foot. Do not let your left / right hip push out to the side. 3. Hold this position for __________ seconds. Repeat  __________ times. Complete this exercise __________ times a day. This information is not intended to replace advice given to you by your health care provider. Make sure you discuss any questions you have with your health care provider. Document Released: 05/23/2004 Document Revised: 12/21/2015 Document Reviewed: 03/31/2015 Elsevier Interactive Patient Education  2018 [employer] Trochanteric Bursitis Trochanteric bursitis is a condition that causes hip pain. Trochanteric bursitis happens when fluid-filled sacs (bursae) in the hip get irritated. Normally these sacs absorb shock and help strong bands of tissue (tendons) in your hip glide smoothly over each other and over your hip bones. What are the causes? This condition results from increased friction between the hip bones and the tendons that go over them. This condition can happen if you:  Have weak hips.  Use your hip muscles too much (overuse).  Get hit in the hip.  What increases the risk? This condition is more likely to develop in:  Women.  Adults who are middle-aged or older.  People with arthritis or a spinal condition.  People with weak buttocks muscles (gluteal muscles).  People who have one leg that is shorter than the other.  People who participate in certain kinds of athletic activities, such as: ? Running sports, especially long-distance running. ? Contact sports, like football or martial arts. ? Sports in which falls may occur, like skiing.  What are the signs or symptoms? The main symptom of this condition is pain and tenderness over the point of your hip. The pain may be:  Sharp and intense.  Dull and achy.  Felt on the outside of your thigh.  It may increase when you:  Lie on your side.  Walk or run.  Go up on stairs.  Sit.  Stand up after sitting.  Stand for long periods of time.  How is this diagnosed? This condition may be diagnosed based on:  Your symptoms.  Your medical  history.  A physical exam.  Imaging tests, such as: ? X-rays to check your bones. ? An MRI or ultrasound to check your tendons and muscles.  During your physical exam, your health care provider will check the movement and strength of your hip. He or she may press on the point of your hip to check for pain. How is this treated? This condition may be treated by:  Resting.  Reducing your activity.  Avoiding activities that cause pain.  Using crutches, a cane, or a walker to decrease the strain on your hip.  Taking medicine to help with swelling.  Having medicine injected into the bursae to help with swelling.  Using ice, heat, and massage therapy for pain relief.  Physical therapy exercises for strength and flexibility.  Surgery (rare).  Follow these instructions at home: Activity  Rest.  Avoid activities that cause pain.  Return to your normal activities as told by your health care provider. Ask your health care provider what activities   are safe for you. Managing pain, stiffness, and swelling  Take over-the-counter and prescription medicines only as told by your health care provider.  If directed, apply heat to the injured area as told by your health care provider. ? Place a towel between your skin and the heat source. ? Leave the heat on for 20-30 minutes. ? Remove the heat if your skin turns bright red. This is especially important if you are unable to feel pain, heat, or cold. You may have a greater risk of getting burned.  If directed, apply ice to the injured area: ? Put ice in a plastic bag. ? Place a towel between your skin and the bag. ? Leave the ice on for 20 minutes, 2-3 times a day. General instructions  If the affected leg is one that you use for driving, ask your health care provider when it is safe to drive.  Use crutches, a cane, or a walker as told by your health care provider.  If one of your legs is shorter than the other, get fitted for a  shoe insert.  Lose weight if you are overweight. How is this prevented?  Wear supportive footwear that is appropriate for your sport.  If you have hip pain, start any new exercise or sport slowly.  Maintain physical fitness, including: ? Strength. ? Flexibility. Contact a health care provider if:  Your pain does not improve with 2-4 weeks. Get help right away if:  You develop severe pain.  You have a fever.  You develop increased redness over your hip.  You have a change in your bowel function or bladder function.  You cannot control the muscles in your feet. This information is not intended to replace advice given to you by your health care provider. Make sure you discuss any questions you have with your health care provider. Document Released: 05/23/2004 Document Revised: 12/20/2015 Document Reviewed: 03/31/2015 Elsevier Interactive Patient Education  2018 [place] Exercises Hand exercises can be helpful to almost anyone. These exercises can strengthen the hands, improve flexibility and movement, and increase blood flow to the hands. These results can make work and daily tasks easier. Hand exercises can be especially helpful for people who have joint pain from arthritis or have nerve damage from overuse (carpal tunnel syndrome). These exercises can also help people who have injured a hand. Most of these hand exercises are fairly gentle stretching routines. You can do them often throughout the day. Still, it is a good idea to ask your health care provider which exercises would be best for you. Warming your hands before exercise may help to reduce stiffness. You can do this with gentle massage or by placing your hands in warm water for 15 minutes. Also, make sure you pay attention to your level of hand pain as you begin an exercise routine. Exercises Knuckle Bend Repeat this exercise 5-10 times with each hand. Stand or sit with your arm, hand, and all five fingers  pointed straight up. Make sure your wrist is straight. Gently and slowly bend your fingers down and inward until the tips of your fingers are touching the tops of your palm. Hold this position for a few seconds. Extend your fingers out to their original position, all pointing straight up again.  Finger Fan Repeat this exercise 5-10 times with each hand. Hold your arm and hand out in front of you. Keep your wrist straight. Squeeze your hand into a fist. Hold this position for a few  seconds. Edison Simon out, or spread apart, your hand and fingers as much as possible, stretching every joint fully.  Tabletop Repeat this exercise 5-10 times with each hand. Stand or sit with your arm, hand, and all five fingers pointed straight up. Make sure your wrist is straight. Gently and slowly bend your fingers at the knuckles where they meet the hand until your hand is making an upside-down L shape. Your fingers should form a tabletop. Hold this position for a few seconds. Extend your fingers out to their original position, all pointing straight up again.  Making Os Repeat this exercise 5-10 times with each hand. Stand or sit with your arm, hand, and all five fingers pointed straight up. Make sure your wrist is straight. Make an O shape by touching your pointer finger to your thumb. Hold for a few seconds. Then open your hand wide. Repeat this motion with each finger on your hand.  Table Spread Repeat this exercise 5-10 times with each hand. Place your hand on a table with your palm facing down. Make sure your wrist is straight. Spread your fingers out as much as possible. Hold this position for a few seconds. Slide your fingers back together again. Hold for a few seconds.  Ball Grip  Repeat this exercise 10-15 times with each hand. Hold a tennis ball or another soft ball in your hand. While slowly increasing pressure, squeeze the ball as hard as possible. Squeeze as hard as you can for 3-5  seconds. Relax and repeat.  Wrist Curls Repeat this exercise 10-15 times with each hand. Sit in a chair that has armrests. Hold a light weight in your hand, such as a dumbbell that weighs 1-3 pounds (0.5-1.4 kg). Ask your health care provider what weight would be best for you. Rest your hand just over the end of the chair arm with your palm facing up. Gently pivot your wrist up and down while holding the weight. Do not twist your wrist from side to side.  Contact a health care provider if: Your hand pain or discomfort gets much worse when you do an exercise. Your hand pain or discomfort does not improve within 2 hours after you exercise. If you have any of these problems, stop doing these exercises right away. Do not do them again unless your health care provider says that you can. Get help right away if: You develop sudden, severe hand pain. If this happens, stop doing these exercises right away. Do not do them again unless your health care provider says that you can. This information is not intended to replace advice given to you by your health care provider. Make sure you discuss any questions you have with your health care provider. Document Released: 03/27/2015 Document Revised: 09/21/2015 Document Reviewed: 10/24/2014 Elsevier Interactive Patient Education  [employer].

## 2017-06-27 ENCOUNTER — Encounter: Payer: Self-pay | Admitting: Rehabilitation

## 2017-06-27 ENCOUNTER — Ambulatory Visit: Payer: 59 | Attending: Physician Assistant | Admitting: Rehabilitation

## 2017-06-27 ENCOUNTER — Other Ambulatory Visit: Payer: Self-pay

## 2017-06-27 DIAGNOSIS — R2681 Unsteadiness on feet: Secondary | ICD-10-CM | POA: Insufficient documentation

## 2017-06-27 DIAGNOSIS — M6281 Muscle weakness (generalized): Secondary | ICD-10-CM | POA: Diagnosis not present

## 2017-06-27 DIAGNOSIS — R296 Repeated falls: Secondary | ICD-10-CM | POA: Diagnosis not present

## 2017-06-27 NOTE — Therapy (Signed)
[facility] [address] [place], [place], [address] Phone: [phone]   Fax:  [phone]  Physical Therapy Evaluation  Patient Details  Name: Katie Maldonado MRN: [id number] Date of Birth: 1951/02/04 Referring Provider: Tenna Delaine, PA-C   Encounter Date: 06/27/2017  PT End of Session - 06/27/17 1549    Visit Number  1    Number of Visits  9    Date for PT Re-Evaluation  07/27/17    Authorization Type  [employer]    PT Start Time  1315    PT Stop Time  1359    PT Time Calculation (min)  44 min    Activity Tolerance  Patient tolerated treatment well    Behavior During Therapy  [facility] for tasks assessed/performed       Past Medical History:  Diagnosis Date  . Allergy   . Anxiety   . Arthritis   . Diverticulitis   . Diverticulosis 10/2015  . Hypersomnolence   . Migraine   . Multiple sclerosis ([place])   . Neuromuscular disorder ([place])   . Obesity, unspecified   . Osteoporosis   . Other and unspecified hyperlipidemia   . TIA (transient ischemic attack) 20    Past Surgical History:  Procedure Laterality Date  . ABDOMINAL HYSTERECTOMY     Fibroids  . arm surgery left    . FRACTURE SURGERY    . TUBAL LIGATION    . WRIST SURGERY      There were no vitals filed for this visit.   Subjective Assessment - 06/27/17 1321    Subjective  "I think I'm just a clutz.  I fell 3 times in the past two months.  I feel like my feet get tangled up.  I have fallen in the bathroom, in my daughter's yard and my own yard."    Limitations  House hold activities;Walking    Patient Stated Goals  "To stop falling"     Currently in Pain?  Yes    Pain Score  5     Pain Location  Back    Pain Orientation  Lower    Pain Descriptors / Indicators  Aching    Pain Type  Chronic pain    Pain Onset  More than a month ago    Pain Frequency  Constant    Aggravating Factors   sitting    Pain Relieving Factors  moving, ice     Multiple Pain Sites  Yes    Pain Score  7    Pain Location  Arm    Pain Orientation  Right    Pain Descriptors / Indicators  Aching    Pain Onset  More than a month ago    Pain Frequency  Intermittent    Aggravating Factors   writing    Pain Relieving Factors  moving (opening and closing hands)         [facility] PT Assessment - 06/27/17 0001      Assessment   Medical Diagnosis  recurrent falls, MS    Referring Provider  Tenna Delaine, PA-C    Prior Therapy  Has had PT in the past for neck and back issues      Precautions   Precautions  Fall      Balance Screen   Has the patient fallen in the past 6 months  Yes    How many times?  4-5    Has the patient had a decrease in activity  level because of a fear of falling?   No    Is the patient reluctant to leave their home because of a fear of falling?   No      Home Environment   Living Environment  Private residence    Living Arrangements  Spouse/significant other    Available Help at Discharge  Family;Available PRN/intermittently    Type of Home  House    Home Access  Level entry    Home Layout  One level    Home Equipment  Grab bars - tub/shower Pt uses garden tub, grab bars in shower      Prior Function   Level of Independence  Independent    Vocation  Full time employment    Pharmacologist job (8 hrs/day)    Leisure  Read, play with grandsons, travel      Cognition   Memory  Impaired    Memory Impairment  Decreased short term memory      Sensation   Light Touch  Impaired Detail    Light Touch Impaired Details  Impaired LUE;Impaired RUE;Impaired RLE;Impaired LLE numbness/tingling in BLEs/UEs    Hot/Cold  Appears Intact    Proprioception  Appears Intact      Coordination   Gross Motor Movements are Fluid and Coordinated  Yes    Fine Motor Movements are Fluid and Coordinated  Yes    Heel Shin Test  normal      Posture/Postural Control   Posture Comments  She reports that she has scoliosis, unsure of  curvature specifics      ROM / Strength   AROM / PROM / Strength  Strength      Strength   Overall Strength  Deficits    Overall Strength Comments  RLE WFL, L hip flex 3/5, L knee ext 4/5, L knee flex 3+/5, L ankle DF 4/5      Transfers   Transfers  Sit to Stand;Stand to Sit    Sit to Stand  6: Modified independent (Device/Increase time)    Five time sit to stand comments   14.31 secs without UE support    Stand to Sit  6: Modified independent (Device/Increase time)      Ambulation/Gait   Ambulation/Gait  Yes    Ambulation/Gait Assistance  7: Independent    Assistive device  None    Gait Pattern  Within Functional Limits    Ambulation Surface  Level;Indoor    Gait velocity  10 m=8.94 secs, 3.67 ft/sec     Stairs  Yes    Stairs Assistance  7: Independent    Stair Management Technique  No rails;Alternating pattern;Forwards    Number of Stairs  4    Height of Stairs  6      Balance   Balance Assessed  Yes      High Level Balance   High Level Balance Comments  Had pt stand on foam airex pad in corner with chair in front for support.  Note that as soon as she stepped onto pad, even with feet apart and EO, she had marked difficulty maintaining balance.  She does report vertigo in the past, likely has vestibular hypofunction.       Functional Gait  Assessment   Gait assessed   Yes    Gait Level Surface  Walks 20 ft in less than 7 sec but greater than 5.5 sec, uses assistive device, slower speed, mild gait deviations, or deviates 6-10 in outside of  the 12 in walkway width.    Change in Gait Speed  Able to smoothly change walking speed without loss of balance or gait deviation. Deviate no more than 6 in outside of the 12 in walkway width.    Gait with Horizontal Head Turns  Performs head turns smoothly with no change in gait. Deviates no more than 6 in outside 12 in walkway width    Gait with Vertical Head Turns  Performs head turns with no change in gait. Deviates no more than 6 in  outside 12 in walkway width.    Gait and Pivot Turn  Pivot turns safely within 3 sec and stops quickly with no loss of balance.    Step Over Obstacle  Is able to step over 2 stacked shoe boxes taped together (9 in total height) without changing gait speed. No evidence of imbalance.    Gait with Narrow Base of Support  Is able to ambulate for 10 steps heel to toe with no staggering.    Gait with Eyes Closed  Walks 20 ft, uses assistive device, slower speed, mild gait deviations, deviates 6-10 in outside 12 in walkway width. Ambulates 20 ft in less than 9 sec but greater than 7 sec.    Ambulating Backwards  Walks 20 ft, uses assistive device, slower speed, mild gait deviations, deviates 6-10 in outside 12 in walkway width.    Steps  Alternating feet, must use rail.    Total Score  26    FGA comment:  25-28 = low risk fall              Objective measurements completed on examination: See above findings.              PT Education - 06/27/17 1548    Education provided  Yes    Education Details  evaluation findings, POC, goals    Person(s) Educated  Patient    Methods  Explanation    Comprehension  Verbalized understanding       PT Short Term Goals - 06/27/17 1554      PT SHORT TERM GOAL #1   Title  =LTGs        PT Long Term Goals - 06/27/17 1554      PT LONG TERM GOAL #1   Title  Pt will be independent with HEP in order to indicate decreased fall risk.  (Target Date: 07/27/17)    Time  4    Period  Weeks    Status  New    Target Date  07/27/17      PT LONG TERM GOAL #2   Title  Will assess SOT and add appropriate goal as able.      Time  4    Period  Weeks    Status  New      PT LONG TERM GOAL #3   Title  Pt will improve FGA to >29/30 in order to indicate pt is no longer fall risk.     Time  4    Period  Weeks    Status  New      PT LONG TERM GOAL #4   Title  Pt will ambulate over varying surfaces (esp grass) up to 500' at mod I level in order to  indicate safe return to leisure and travel.      Time  4    Period  Weeks    Status  New  Plan - 06/27/17 1550    Clinical Impression Statement  Pt presents s/p recurrent falls (3 in the last 2-3 months).  Note history of MS (diagnosed in 2000 per pt report), osteoporosis, DDD, and osteoarthritis which could all impact progress in therapy.  She also notes spinal scoliosis which she found out about a year ago during previous PT encounter.  Upon performing PT evaluation, note gait speed WFL at 3.67 ft/sec, FGA is 26/30 indicative of low fall risk, however note marked LOB when standing on compliant surface.  Feel she likely has vestibular hypofunction, PT plans to assess SOT at next visit.  Pt will benefit from skilled PT in order to address deficits.      History and Personal Factors relevant to plan of care:  See above, unable to be in yard with grandkids, unable to travel    Clinical Presentation  Stable    Clinical Decision Making  Low    Rehab Potential  Excellent    PT Frequency  2x / week    PT Duration  4 weeks    PT Treatment/Interventions  ADLs/Self Care Home Management;Gait training;Stair training;Functional mobility training;Therapeutic activities;Therapeutic exercise;Balance training;Neuromuscular re-education;Patient/family education;Vestibular    PT Next Visit Plan  SOT-add goal, provide pt with HEP based on deficits from SOT, continue high level balance on compliance surface, work towards EC, LLE strengthening   Consulted and Agree with Plan of Care  Patient       Patient will benefit from skilled therapeutic intervention in order to improve the following deficits and impairments:  Decreased balance, Decreased mobility, Improper body mechanics, Postural dysfunction, Impaired sensation  Visit Diagnosis: Unsteadiness on feet  Repeated falls     Problem List Patient Active Problem List   Diagnosis Date Noted  . Osteoarthritis of lumbar spine 10/22/2016  .  Primary osteoarthritis of both feet 03/09/2016  . Primary osteoarthritis of both knees 03/09/2016  . Degenerative cervical disc 10/04/2014  . Primary osteoarthritis of both hands 08/19/2014  . Osteoporosis 11/13/2012  . Incontinence of urine 04/18/2011  . HYPERLIPIDEMIA-MIXED 01/08/2010  . MULTIPLE SCLEROSIS 01/08/2010    Denice Bors 06/27/2017, 3:57 PM  [facility] [address] [place], [place], [address] Phone: [phone]   Fax:  [phone]  Name: LOANY NEUROTH MRN: [id number] Date of Birth: 09-30-1950

## 2017-06-30 LAB — URIC ACID: Uric Acid, Serum: 5.2 mg/dL (ref 2.5–7.0)

## 2017-06-30 LAB — CYCLIC CITRUL PEPTIDE ANTIBODY, IGG

## 2017-06-30 LAB — 14-3-3 ETA PROTEIN: 14-3-3 eta Protein: <0.2 ng/mL (ref <0.2)

## 2017-06-30 LAB — RHEUMATOID FACTOR

## 2017-07-01 ENCOUNTER — Other Ambulatory Visit: Payer: Self-pay | Admitting: Physician Assistant

## 2017-07-01 DIAGNOSIS — R234 Changes in skin texture: Secondary | ICD-10-CM

## 2017-07-01 NOTE — Progress Notes (Signed)
All labs are WNL

## 2017-07-01 NOTE — Progress Notes (Signed)
Orders Placed This Encounter  Procedures  . MM Digital Diagnostic Bilat    Standing Status:   Future    Standing Expiration Date:   09/01/2018    Order Specific Question:   Reason for Exam (SYMPTOM  OR DIAGNOSIS REQUIRED)    Answer:   knot under right breast    Order Specific Question:   Preferred imaging location?    Answer:   [facility]  . US BREAST COMPLETE UNI LEFT INC AXILLA    Standing Status:   Future    Standing Expiration Date:   09/01/2018    Order Specific Question:   Reason for Exam (SYMPTOM  OR DIAGNOSIS REQUIRED)    Answer:   knot under right breast    Order Specific Question:   Preferred imaging location?    Answer:   [id number]  . US BREAST COMPLETE UNI RIGHT INC AXILLA    Standing Status:   Future    Standing Expiration Date:   09/01/2018    Order Specific Question:   Reason for Exam (SYMPTOM  OR DIAGNOSIS REQUIRED)    Answer:   knot under right breast    Order Specific Question:   Preferred imaging location?    Answer:   [id number]

## 2017-07-04 ENCOUNTER — Ambulatory Visit: Payer: 59

## 2017-07-04 DIAGNOSIS — R2681 Unsteadiness on feet: Secondary | ICD-10-CM

## 2017-07-04 DIAGNOSIS — R296 Repeated falls: Secondary | ICD-10-CM | POA: Diagnosis not present

## 2017-07-04 DIAGNOSIS — M6281 Muscle weakness (generalized): Secondary | ICD-10-CM | POA: Diagnosis not present

## 2017-07-04 NOTE — Patient Instructions (Addendum)
Perform in a corner with a chair in front of you OR at kitchen sink with chair behind you for safety:   Feet Apart (Compliant Surface) Varied Arm Positions - Eyes Closed    Stand on compliant surface: __pillows/cushions______ with feet shoulder width apart and arms at your side. Close eyes and visualize upright position. Hold__10-30__ seconds. Repeat ____ times per session. Do ____ sessions per day.  Copyright  VHI. All rights reserved.  Feet Together (Compliant Surface) Head Motion - Eyes Open    With eyes open, standing on compliant surface: _pillows/cushions_______, feet together, move head slowly: up and down 10 times and side to side 10 times. Repeat __3__ times per session. Do __1__ sessions per day.  Copyright  VHI. All rights reserved.  Feet Heel-Toe "Tandem" (Compliant Surface) Varied Arm Positions - Eyes Open    With eyes open, standing on compliant surface: __pillows/cushions______, right foot directly in front of the other and arms at your side, look at a stationary object. Hold __10-30__ seconds. Switch and perform with other foot in front. Repeat __3__ times per session per leg. Do __1__ sessions per day.  Copyright  VHI. All rights reserved.

## 2017-07-04 NOTE — Therapy (Signed)
[facility] [address] [place], [place], [address] Phone: [phone]   Fax:  [phone]  Physical Therapy Treatment  Patient Details  Name: Katie Maldonado MRN: [id number] Date of Birth: 03-31-1951 Referring Provider: Tenna Delaine, PA-C   Encounter Date: 07/04/2017  PT End of Session - 07/04/17 1235    Visit Number  2    Number of Visits  9    Date for PT Re-Evaluation  07/27/17    Authorization Type  [employer]    PT Start Time  1151    PT Stop Time  1230    PT Time Calculation (min)  39 min    Equipment Utilized During Treatment  -- SOT harness and S for safety.    Activity Tolerance  Patient tolerated treatment well    Behavior During Therapy  [facility] for tasks assessed/performed       Past Medical History:  Diagnosis Date  . Allergy   . Anxiety   . Arthritis   . Diverticulitis   . Diverticulosis 10/2015  . Hypersomnolence   . Migraine   . Multiple sclerosis ([place])   . Neuromuscular disorder ([place])   . Obesity, unspecified   . Osteoporosis   . Other and unspecified hyperlipidemia   . TIA (transient ischemic attack) 20    Past Surgical History:  Procedure Laterality Date  . ABDOMINAL HYSTERECTOMY     Fibroids  . arm surgery left    . FRACTURE SURGERY    . TUBAL LIGATION    . WRIST SURGERY      There were no vitals filed for this visit.  Subjective Assessment - 07/04/17 1153    Subjective  Pt denied falls or changes since last visit.     Limitations  House hold activities;Walking    Patient Stated Goals  "To stop falling"     Currently in Pain?  Yes    Pain Score  6     Pain Location  Neck    Pain Orientation  Left    Pain Descriptors / Indicators  Aching    Pain Type  Chronic pain    Pain Radiating Towards  L shoulder    Pain Onset  In the past 7 days    Pain Frequency  Constant    Aggravating Factors   Medication gel     Pain Relieving Factors  Medication gel                  Neuro re-ed: Neuro re-ed: sensory organization test performed with following results: Conditions: 1: 3 trials WNL 2: 3 trials below normal 3: 3 trials below normal  4: 2 trials below normal and 1 "fall" trial 5: 3 "fall" trials 6: 3 "fall" trials Composite score: 30 Sensory Analysis Som: Below normal (~80) Vis: Below normal (~25) Vest: Below normal (~1) Pref: Below normal Strategy analysis: Good use of hip/ankle strategies during first 3 conditions, but then little or no use of hip strategy during last 3 conditions.  COG alignment: Anterior bias during all conditions.                  Balance Exercises - 07/04/17 1234      Balance Exercises: Standing   Standing Eyes Opened  Narrow base of support (BOS);Wide (BOA);Head turns;Foam/compliant surface;3 reps;10 secs;30 secs    Standing Eyes Closed  Wide (BOA);Foam/compliant surface;2 reps;10 secs    Tandem Stance  Eyes open;Foam/compliant surface;Intermittent upper extremity support;2 reps;10 secs;30 secs  Other Standing Exercises  Performed in corner with chair in front of pt with S for safety. Cues and demo for technique. Please see pt instructions for HEP details.         PT Education - 07/04/17 1235    Education provided  Yes    Education Details  PT discussed SOT results and provided pt with balance HEP.     Person(s) Educated  Patient    Methods  Explanation;Demonstration;Verbal cues;Handout    Comprehension  Returned demonstration;Verbalized understanding       PT Short Term Goals - 06/27/17 1554      PT SHORT TERM GOAL #1   Title  =LTGs        PT Long Term Goals - 07/04/17 1239      PT LONG TERM GOAL #1   Title  Pt will be independent with HEP in order to indicate decreased fall risk.  (Target Date: 07/27/17)    Time  4    Period  Weeks    Status  New      PT LONG TERM GOAL #2   Title  Will assess SOT and add appropriate goal as able.      Time  4    Period  Weeks     Status  Achieved      PT LONG TERM GOAL #3   Title  Pt will improve FGA to >29/30 in order to indicate pt is no longer fall risk.     Time  4    Period  Weeks    Status  New      PT LONG TERM GOAL #4   Title  Pt will ambulate over varying surfaces (esp grass) up to 500' at mod I level in order to indicate safe return to leisure and travel.      Time  4    Period  Weeks    Status  New      PT LONG TERM GOAL #5   Title  Pt will improve her SOT composite score to WNL for her age group (~67) to reduce falls risk.     Status  New            Plan - 07/04/17 1237    Clinical Impression Statement  Pt's SOT composite score (30) was below normal limits (~67) for her age group (60-69y/o). Pt's sensory analysis indicates decr. somatosensory, visual and vestibular input. Pt experienced incr. postural sway during balance activities, which required incr. vestibular input. Pt would continue to benefit from skilled PT to improve safety during functional mobility.     Rehab Potential  Excellent    PT Frequency  2x / week    PT Duration  4 weeks    PT Treatment/Interventions  ADLs/Self Care Home Management;Gait training;Stair training;Functional mobility training;Therapeutic activities;Therapeutic exercise;Balance training;Neuromuscular re-education;Patient/family education;Vestibular    PT Next Visit Plan  Review balance HEP and progress/modify as needed, continue high level balance on compliance surface, work towards [place] and Agree with Plan of Care  Patient       Patient will benefit from skilled therapeutic intervention in order to improve the following deficits and impairments:  Decreased balance, Decreased mobility, Improper body mechanics, Postural dysfunction, Impaired sensation  Visit Diagnosis: Unsteadiness on feet  Repeated falls     Problem List Patient Active Problem List   Diagnosis Date Noted  . Osteoarthritis of lumbar spine 10/22/2016  . Primary  osteoarthritis of both feet 03/09/2016  .  Primary osteoarthritis of both knees 03/09/2016  . Degenerative cervical disc 10/04/2014  . Primary osteoarthritis of both hands 08/19/2014  . Osteoporosis 11/13/2012  . Incontinence of urine 04/18/2011  . HYPERLIPIDEMIA-MIXED 01/08/2010  . MULTIPLE SCLEROSIS 01/08/2010    Kethan Papadopoulos L 07/04/2017, 12:42 PM  [facility] [address] [place], [place], [address] Phone: [phone]   Fax:  [phone]  Name: Katie Maldonado MRN: [id number] Date of Birth: 11-01-1950  Geoffry Paradise, PT,DPT 07/04/17 12:45 PM Phone: [phone] Fax: [phone]

## 2017-07-11 ENCOUNTER — Ambulatory Visit: Payer: 59 | Admitting: Physical Therapy

## 2017-07-11 ENCOUNTER — Encounter: Payer: Self-pay | Admitting: Physical Therapy

## 2017-07-11 DIAGNOSIS — R296 Repeated falls: Secondary | ICD-10-CM | POA: Diagnosis not present

## 2017-07-11 DIAGNOSIS — R2681 Unsteadiness on feet: Secondary | ICD-10-CM | POA: Diagnosis not present

## 2017-07-11 DIAGNOSIS — M6281 Muscle weakness (generalized): Secondary | ICD-10-CM | POA: Diagnosis not present

## 2017-07-11 NOTE — Therapy (Signed)
[facility] [address] [place], [place], [address] Phone: [phone]   Fax:  [phone]  Physical Therapy Treatment  Patient Details  Name: Katie Maldonado MRN: [id number] Date of Birth: [birth year] Referring Provider: Tenna Delaine, PA-C   Encounter Date: 07/11/2017  PT End of Session - 07/11/17 1405    Visit Number  3    Number of Visits  9    Date for PT Re-Evaluation  07/27/17    Authorization Type  [employer]    PT Start Time  9211    PT Stop Time  1445    PT Time Calculation (min)  42 min    Equipment Utilized During Treatment  Gait belt    Activity Tolerance  Patient tolerated treatment well    Behavior During Therapy  [facility] for tasks assessed/performed       Past Medical History:  Diagnosis Date  . Allergy   . Anxiety   . Arthritis   . Diverticulitis   . Diverticulosis 10/2015  . Hypersomnolence   . Migraine   . Multiple sclerosis ([place])   . Neuromuscular disorder ([place])   . Obesity, unspecified   . Osteoporosis   . Other and unspecified hyperlipidemia   . TIA (transient ischemic attack) 20    Past Surgical History:  Procedure Laterality Date  . ABDOMINAL HYSTERECTOMY     Fibroids  . arm surgery left    . FRACTURE SURGERY    . TUBAL LIGATION    . WRIST SURGERY      There were no vitals filed for this visit.  Subjective Assessment - 07/11/17 1404    Subjective  No new complaints. No falls or pain to report. Doing corner balance ex's, still challenging.     Limitations  House hold activities;Walking    Patient Stated Goals  "To stop falling"     Currently in Pain?  No/denies    Pain Score  0-No pain           [facility] Adult PT Treatment/Exercise - 07/11/17 1416      High Level Balance   High Level Balance Activities  Side stepping;Tandem walking    High Level Balance Comments  on blue foam beam with no UE support: 3 laps each with mirrow used to provide feedback on foot  placement and promote more upright posture. min guard to min assist for balance. bil ankle instability noted as well, pt able to correct lateral roll with cues.                         Neuro Re-ed    Neuro Re-ed Details   for balance reactions/increased vestibular imput/coordination:  fwd gait along track tossing ball up/catching it, tracking ball with eyes, min guard assist, mild symptoms reported that resolved with rest; along ~50 foot hallway: forward gait with head movements left<>fwd<>right, then up<>fwd<>down x 2 laps each., min guard assist with decreased gait speed noted, minor veering and mild dizzines that resolved quickly with rest breaks after each lap.                            Balance Exercises - 07/11/17 1415      Balance Exercises: Standing   Standing Eyes Closed  Wide (BOA);Narrow base of support (BOS);Foam/compliant surface;Head turns;Other reps (comment);30 secs;Limitations    Rockerboard  Anterior/posterior;Lateral;Head turns;EO;EC;30 seconds      Balance Exercises:  Standing   Standing Eyes Closed Limitations  on pillows in corner with chair in front for safety: wide base of support for EC no head movements, progressing to EC head movements left<>right, up<>down and diagonals both ways, progressing to narrow base of support for EC no head movements. min guard to min assist for balance with minimal postural sway noted. no UE support as well.                     Rebounder Limitations  performed both ways on balance board: EO rocking board with emphasis on tall poture, progressing to EC rocking board. no UE support; holding board steady: EC no head movements for 30 sec's x 3 reps no UE support. min assist for balance with cues on posture, weight shifting to assist with balance.           PT Short Term Goals - 06/27/17 1554      PT SHORT TERM GOAL #1   Title  =LTGs        PT Long Term Goals - 07/04/17 1239      PT LONG TERM GOAL #1   Title  Pt will be independent  with HEP in order to indicate decreased fall risk.  (Target Date: 07/27/17)    Time  4    Period  Weeks    Status  New      PT LONG TERM GOAL #2   Title  Will assess SOT and add appropriate goal as able.      Time  4    Period  Weeks    Status  Achieved      PT LONG TERM GOAL #3   Title  Pt will improve FGA to >29/30 in order to indicate pt is no longer fall risk.     Time  4    Period  Weeks    Status  New      PT LONG TERM GOAL #4   Title  Pt will ambulate over varying surfaces (esp grass) up to 500' at mod I level in order to indicate safe return to leisure and travel.      Time  4    Period  Weeks    Status  New      PT LONG TERM GOAL #5   Title  Pt will improve her SOT composite score to WNL for her age group (~67) to reduce falls risk.     Status  New            Plan - 07/11/17 1405    Clinical Impression Statement  Today's skilled session continued to focus on high level balance/dynamic gait activities that required increased vestibular imput with vision removed at times. Pt continues to demo postural sway/decreased balance with these challenges. Pt is progressing toward goals and should benefit from continued PT to progress toward unmet goals.     Rehab Potential  Excellent    PT Frequency  2x / week    PT Duration  4 weeks    PT Treatment/Interventions  ADLs/Self Care Home Management;Gait training;Stair training;Functional mobility training;Therapeutic activities;Therapeutic exercise;Balance training;Neuromuscular re-education;Patient/family education;Vestibular    PT Next Visit Plan  continue high level balance on compliant surfaces with vision removed; begin to work on single leg stance activities as well     Consulted and Agree with Plan of Care  Patient       Patient will benefit from skilled therapeutic intervention in order to improve the  following deficits and impairments:  Decreased balance, Decreased mobility, Improper body mechanics, Postural  dysfunction, Impaired sensation  Visit Diagnosis: Unsteadiness on feet  Repeated falls     Problem List Patient Active Problem List   Diagnosis Date Noted  . Osteoarthritis of lumbar spine 10/22/2016  . Primary osteoarthritis of both feet 03/09/2016  . Primary osteoarthritis of both knees 03/09/2016  . Degenerative cervical disc 10/04/2014  . Primary osteoarthritis of both hands 08/19/2014  . Osteoporosis 11/13/2012  . Incontinence of urine 04/18/2011  . HYPERLIPIDEMIA-MIXED 01/08/2010  . MULTIPLE SCLEROSIS 01/08/2010    Willow Ora, PTA, [facility] [address], [place], [place] [address] [phone] 07/11/17, 3:22 PM   Name: ERYNNE KEALEY MRN: [id number] Date of Birth: 1951-03-30

## 2017-07-14 ENCOUNTER — Ambulatory Visit: Payer: 59 | Admitting: Physical Therapy

## 2017-07-14 ENCOUNTER — Encounter: Payer: Self-pay | Admitting: Physical Therapy

## 2017-07-14 DIAGNOSIS — R2681 Unsteadiness on feet: Secondary | ICD-10-CM

## 2017-07-14 DIAGNOSIS — R296 Repeated falls: Secondary | ICD-10-CM | POA: Diagnosis not present

## 2017-07-14 DIAGNOSIS — M6281 Muscle weakness (generalized): Secondary | ICD-10-CM

## 2017-07-14 NOTE — Therapy (Signed)
[facility] [address] [place], [place], [address] Phone: [phone]   Fax:  [phone]  Physical Therapy Treatment  Patient Details  Name: Katie Maldonado MRN: [id number] Date of Birth: [birth year] Referring Provider: Tenna Delaine, PA-C   Encounter Date: 07/14/2017  PT End of Session - 07/14/17 1717    Visit Number  4    Number of Visits  9    Date for PT Re-Evaluation  07/27/17    Authorization Type  [employer]    PT Start Time  1315    PT Stop Time  1359    PT Time Calculation (min)  44 min    Equipment Utilized During Treatment  Gait belt    Activity Tolerance  Patient tolerated treatment well    Behavior During Therapy  [facility] for tasks assessed/performed       Past Medical History:  Diagnosis Date  . Allergy   . Anxiety   . Arthritis   . Diverticulitis   . Diverticulosis 10/2015  . Hypersomnolence   . Migraine   . Multiple sclerosis ([place])   . Neuromuscular disorder ([place])   . Obesity, unspecified   . Osteoporosis   . Other and unspecified hyperlipidemia   . TIA (transient ischemic attack) 20    Past Surgical History:  Procedure Laterality Date  . ABDOMINAL HYSTERECTOMY     Fibroids  . arm surgery left    . FRACTURE SURGERY    . TUBAL LIGATION    . WRIST SURGERY      There were no vitals filed for this visit.  Subjective Assessment - 07/14/17 1321    Subjective  Pt reports no falls, no changes in medications, and pain to report.     Limitations  House hold activities;Walking    Patient Stated Goals  "To stop falling"     Currently in Pain?  No/denies          [facility] Adult PT Treatment/Exercise - 07/14/17 1701      Transfers   Transfers  Sit to Stand;Stand to Sit    Sit to Stand  6: Modified independent (Device/Increase time)    Stand to Sit  6: Modified independent (Device/Increase time)    Number of Reps  -- x 4 reps during rest breaks.       Ambulation/Gait   Ambulation/Gait  Yes    Ambulation/Gait Assistance  5: Supervision;6: Modified independent (Device/Increase time);4: Min assist Changed with dual tasks gait    Ambulation Distance (Feet)  650 Feet    Assistive device  None    Gait Pattern  Within Functional Limits    Ambulation Surface  Level;Indoor    Gait Comments  Pt able to perform gait for 650 feet without rest breaks, while being able to perform dual tasks for tossing and catching small ball (small rubber ball and progressed to tennis ball) along with a dual task of cognitive thinking verbally stated. Pt was able to handle progressions of tasks but did continue to decrease gait speed towards the end of gait trial. Pt with no loss of balance episodes. SPTA provided supervision assistance to modified independent due to increased time with dual tasks, supervision via gait belt usage for safety.       Neuro Re-ed    Neuro Re-ed Details   For balance reactions/increased vestibular imput/coordination:  fwd gait along track tossing ball up/catching it, tracking ball with eyes, supervision assist to modified assistance due to increased time, no  symptoms regarding dizziness reported. Followed by SLS with contralateral leg on purple foam, while pt performing overhead toss to wall and catch x 20 reps with each foot; progressed task by performing standing on airex pad in an open stance, then narrow BOS, then staggered tandem stance meanwhile performing high under hand toss to wall and catching x 20 reps each. Pt also performed star drill x 5 reps bilaterally. Pt required minimal assistance for tandem stance exercises due to lateral swaying during catches. During star drill, pt required use of steppage strategy x 2 reps to regain balance.                            Balance Exercises - 07/14/17 1713      Balance Exercises: Standing   Standing Eyes Closed  Wide (BOA);Narrow base of support (BOS);Foam/compliant surface;Head turns;Other reps (comment);30  secs;Limitations    Tandem Stance  Eyes open;Foam/compliant surface;Intermittent upper extremity support;2 reps;30 secs;Eyes closed      Balance Exercises: Standing   Standing Eyes Closed Limitations  Performed standing on two pillows, in corner with chair in front for safety: wide base of support for EC no head movements, progressing to EC head movements left<>right, up<>down, narrow base of support for EC no head movements. progressed narrow BOS with head turns up/down, left/right, followed by adding tandem stance for 30 sec x 2 reps; provided min guard to min assist for balance with minimal postural swaying all directions noted. no UE support as well.                      PT Short Term Goals - 06/27/17 1554      PT SHORT TERM GOAL #1   Title  =LTGs        PT Long Term Goals - 07/04/17 1239      PT LONG TERM GOAL #1   Title  Pt will be independent with HEP in order to indicate decreased fall risk.  (Target Date: 07/27/17)    Time  4    Period  Weeks    Status  New      PT LONG TERM GOAL #2   Title  Will assess SOT and add appropriate goal as able.      Time  4    Period  Weeks    Status  Achieved      PT LONG TERM GOAL #3   Title  Pt will improve FGA to >29/30 in order to indicate pt is no longer fall risk.     Time  4    Period  Weeks    Status  New      PT LONG TERM GOAL #4   Title  Pt will ambulate over varying surfaces (esp grass) up to 500' at mod I level in order to indicate safe return to leisure and travel.      Time  4    Period  Weeks    Status  New      PT LONG TERM GOAL #5   Title  Pt will improve her SOT composite score to WNL for her age group (~67) to reduce falls risk.     Status  New        Plan - 07/14/17 1718    Clinical Impression Statement  Today's skilled PT session focused on progressing gait distance while performing dual tasks in tossing and catching a ball and verbalizing a  cognitive critical thinking task. Also progressed static dynamic  balance on an uneven surfaces and dynamic balance drills. Pt demonstrated improved tolerance to increased activity and vestibular imput. Pt remained without any dizziness during entire PT session. Pt continues to make progress towards LTG's and would benefit towards skilled PT interventions towards reaching unmet PT goals.     History and Personal Factors relevant to plan of care:  See above    Clinical Presentation  Stable    Clinical Decision Making  Low    Rehab Potential  Excellent    PT Frequency  2x / week    PT Duration  4 weeks    PT Treatment/Interventions  ADLs/Self Care Home Management;Gait training;Stair training;Functional mobility training;Therapeutic activities;Therapeutic exercise;Balance training;Neuromuscular re-education;Patient/family education;Vestibular    PT Next Visit Plan  Progress high level balance on compliant surfaces with vision removed and single leg stance activities.     Consulted and Agree with Plan of Care  Patient       Patient will benefit from skilled therapeutic intervention in order to improve the following deficits and impairments:  Decreased balance, Decreased mobility, Improper body mechanics, Postural dysfunction, Impaired sensation  Visit Diagnosis: Unsteadiness on feet  Repeated falls  Muscle weakness (generalized)     Problem List Patient Active Problem List   Diagnosis Date Noted  . Osteoarthritis of lumbar spine 10/22/2016  . Primary osteoarthritis of both feet 03/09/2016  . Primary osteoarthritis of both knees 03/09/2016  . Degenerative cervical disc 10/04/2014  . Primary osteoarthritis of both hands 08/19/2014  . Osteoporosis 11/13/2012  . Incontinence of urine 04/18/2011  . HYPERLIPIDEMIA-MIXED 01/08/2010  . MULTIPLE SCLEROSIS 01/08/2010    Carlena Sax, SPTA 07/15/2017, 9:47 AM  [facility] [address] [place], [place], [address] Phone: [phone]   Fax:   [phone]  Name: Katie Maldonado MRN: [id number] Date of Birth: 23-Aug-1950

## 2017-07-17 ENCOUNTER — Encounter: Payer: Self-pay | Admitting: Physician Assistant

## 2017-07-18 ENCOUNTER — Ambulatory Visit: Payer: 59 | Admitting: Rehabilitation

## 2017-07-18 ENCOUNTER — Encounter: Payer: Self-pay | Admitting: Rehabilitation

## 2017-07-18 DIAGNOSIS — R296 Repeated falls: Secondary | ICD-10-CM | POA: Diagnosis not present

## 2017-07-18 DIAGNOSIS — M6281 Muscle weakness (generalized): Secondary | ICD-10-CM | POA: Diagnosis not present

## 2017-07-18 DIAGNOSIS — R2681 Unsteadiness on feet: Secondary | ICD-10-CM

## 2017-07-18 NOTE — Patient Instructions (Signed)
Perform in a corner with a chair in front of you OR at kitchen sink with chair behind you for safety:   Feet Apart (Compliant Surface) Varied Arm Positions - Eyes Closed    Stand on compliant surface: __pillows/cushions______ with feet slightly narrower than shoulder width apart and arms at your side. Close eyes and visualize upright position. Hold__10-30__ seconds. Repeat ____ times per session. Do ____ sessions per day.  Feet Apart (Compliant Surface) Head Motion - Eyes Closed    Stand on compliant surface: __pillow______ with feet shoulder width or slightly narrower apart. Close eyes and move head slowly, up and down x 10 reps and side to side x 10 reps.  Repeat __1__ times per session. Do __2__ sessions per day.  Copyright  VHI. All rights reserved.    Copyright  VHI. All rights reserved.  Feet Together (Compliant Surface) Head Motion - Eyes Open    With eyes open, standing on compliant surface: _pillows/cushions_______, feet together, move head slowly: up and down 10 times and side to side 10 times. Repeat __3__ times per session. Do __1__ sessions per day.  Copyright  VHI. All rights reserved.  Feet Heel-Toe "Tandem" (Compliant Surface) Varied Arm Positions - Eyes Open    With eyes open, standing on compliant surface: __pillows/cushions______, right foot directly in front of the other and arms at your side, look at a stationary object. Hold __30__ seconds. Switch and perform with other foot in front. Repeat __3__ times per session per leg. Do __1__ sessions per day.

## 2017-07-18 NOTE — Therapy (Signed)
[facility] [address] [place], [place], [address] Phone: [phone]   Fax:  [phone]  Physical Therapy Treatment  Patient Details  Name: Katie Maldonado MRN: [id number] Date of Birth: 1950-11-01 Referring Provider: Tenna Delaine, PA-C   Encounter Date: 07/18/2017  PT End of Session - 07/18/17 1439    Visit Number  5    Number of Visits  9    Date for PT Re-Evaluation  07/27/17    Authorization Type  [employer]    PT Start Time  1402    PT Stop Time  1445    PT Time Calculation (min)  43 min    Equipment Utilized During Treatment  Gait belt    Activity Tolerance  Patient tolerated treatment well    Behavior During Therapy  [facility] for tasks assessed/performed       Past Medical History:  Diagnosis Date  . Allergy   . Anxiety   . Arthritis   . Diverticulitis   . Diverticulosis 10/2015  . Hypersomnolence   . Migraine   . Multiple sclerosis ([place])   . Neuromuscular disorder ([place])   . Obesity, unspecified   . Osteoporosis   . Other and unspecified hyperlipidemia   . TIA (transient ischemic attack) 20    Past Surgical History:  Procedure Laterality Date  . ABDOMINAL HYSTERECTOMY     Fibroids  . arm surgery left    . FRACTURE SURGERY    . TUBAL LIGATION    . WRIST SURGERY      There were no vitals filed for this visit.  Subjective Assessment - 07/18/17 1405    Subjective  Pt reports no changes since last session, no falls.     Limitations  House hold activities;Walking    Patient Stated Goals  "To stop falling"     Currently in Pain?  No/denies                No data recorded       [facility] Adult PT Treatment/Exercise - 07/18/17 1415      Ambulation/Gait   Ambulation/Gait  Yes    Ambulation/Gait Assistance  6: Modified independent (Device/Increase time);5: Supervision    Ambulation/Gait Assistance Details  Pt able to ambulate over varying surfaces outdoors at mostly mod I  level, however requires S for safety when given task of performing head turns during gait (esp with horizontal head turns).      Ambulation Distance (Feet)  600 Feet    Assistive device  None    Gait Pattern  Within Functional Limits    Ambulation Surface  Level;Unlevel;Indoor;Outdoor;Paved;Gravel;Grass    Stairs  Yes    Stairs Assistance  7: Independent;5: Supervision    Stairs Assistance Details (indicate cue type and reason)  Pt reports that she has avoided stairs due to getting dizzy.  PT had pt perform stairs x 4 reps during session in attempts to provoke this.  Pt did not have any dizziness, therefore encouraged her to perform stairs at work (pt somewhat reluctant, therefore recommended she perform single level first then increase as able.).     Stair Management Technique  No rails;Alternating pattern;Forwards    Number of Stairs  4 x 4 reps    Height of Stairs  6      Neuro Re-ed    Neuro Re-ed Details   Went over current corner balance HEP to assess if exercises still appropriate.  Did update BOS in single exercise and  added another.  See pt instruction for details on exercises and reps performed.  Continued with balance exercises on compliant surfaces with tandem gait while on foam balance beam forwards/backwards x 2 reps each direction.  Wall bumps with feet on foam balance beam first with feet shoulder width apart x 5 reps with 5 sec hold progressing to feet together x 5 reps with 5 sec hold.                 PT Short Term Goals - 06/27/17 1554      PT SHORT TERM GOAL #1   Title  =LTGs        PT Long Term Goals - 07/04/17 1239      PT LONG TERM GOAL #1   Title  Pt will be independent with HEP in order to indicate decreased fall risk.  (Target Date: 07/27/17)    Time  4    Period  Weeks    Status  New      PT LONG TERM GOAL #2   Title  Will assess SOT and add appropriate goal as able.      Time  4    Period  Weeks    Status  Achieved      PT LONG TERM GOAL #3    Title  Pt will improve FGA to >29/30 in order to indicate pt is no longer fall risk.     Time  4    Period  Weeks    Status  New      PT LONG TERM GOAL #4   Title  Pt will ambulate over varying surfaces (esp grass) up to 500' at mod I level in order to indicate safe return to leisure and travel.      Time  4    Period  Weeks    Status  New      PT LONG TERM GOAL #5   Title  Pt will improve her SOT composite score to WNL for her age group (~67) to reduce falls risk.     Status  New            Plan - 07/18/17 1512    Clinical Impression Statement  Skilled session went over current HEP and updated as needed as well as continuing to work on balance with emphasis on challenging vestibular system.  Pt making excellent progress and discussed potentially ending next Wednesday pending SOT assessment.  Pt verbalized understanding.     Rehab Potential  Excellent    PT Frequency  2x / week    PT Duration  4 weeks    PT Treatment/Interventions  ADLs/Self Care Home Management;Gait training;Stair training;Functional mobility training;Therapeutic activities;Therapeutic exercise;Balance training;Neuromuscular re-education;Patient/family education;Vestibular    PT Next Visit Plan  Progress high level balance on compliant surfaces with vision removed and single leg stance activities. Planning to D/C on Wednesday 3/27 if SOT has shown improvement, otherwise may re-cert?    Consulted and Agree with Plan of Care  Patient       Patient will benefit from skilled therapeutic intervention in order to improve the following deficits and impairments:  Decreased balance, Decreased mobility, Improper body mechanics, Postural dysfunction, Impaired sensation  Visit Diagnosis: Unsteadiness on feet  Repeated falls  Muscle weakness (generalized)     Problem List Patient Active Problem List   Diagnosis Date Noted  . Osteoarthritis of lumbar spine 10/22/2016  . Primary osteoarthritis of both feet  03/09/2016  . Primary osteoarthritis of  both knees 03/09/2016  . Degenerative cervical disc 10/04/2014  . Primary osteoarthritis of both hands 08/19/2014  . Osteoporosis 11/13/2012  . Incontinence of urine 04/18/2011  . HYPERLIPIDEMIA-MIXED 01/08/2010  . MULTIPLE SCLEROSIS 01/08/2010    Cameron Sprang, PT, MPT [facility] [address] [place], [place], [address] Phone: [phone]   Fax:  [phone] 07/18/17, 3:14 PM  Name: Katie Maldonado MRN: [id number] Date of Birth: March 03, 1951

## 2017-07-21 ENCOUNTER — Ambulatory Visit: Payer: 59 | Admitting: Rehabilitation

## 2017-07-21 ENCOUNTER — Encounter: Payer: Self-pay | Admitting: Rehabilitation

## 2017-07-21 DIAGNOSIS — R296 Repeated falls: Secondary | ICD-10-CM | POA: Diagnosis not present

## 2017-07-21 DIAGNOSIS — M6281 Muscle weakness (generalized): Secondary | ICD-10-CM | POA: Diagnosis not present

## 2017-07-21 DIAGNOSIS — R2681 Unsteadiness on feet: Secondary | ICD-10-CM

## 2017-07-21 NOTE — Therapy (Signed)
[facility] [address] [place], [place], [address] Phone: [phone]   Fax:  [phone]  Physical Therapy Treatment  Patient Details  Name: Katie Maldonado MRN: [id number] Date of Birth: [birth year] Referring Provider: Tenna Delaine, PA-C   Encounter Date: 07/21/2017  PT End of Session - 07/21/17 1152    Visit Number  6    Number of Visits  9    Date for PT Re-Evaluation  07/27/17    Authorization Type  [employer]    PT Start Time  1147    PT Stop Time  1230    PT Time Calculation (min)  43 min    Equipment Utilized During Treatment  Gait belt    Activity Tolerance  Patient tolerated treatment well    Behavior During Therapy  [facility] for tasks assessed/performed       Past Medical History:  Diagnosis Date  . Allergy   . Anxiety   . Arthritis   . Diverticulitis   . Diverticulosis 10/2015  . Hypersomnolence   . Migraine   . Multiple sclerosis ([place])   . Neuromuscular disorder ([place])   . Obesity, unspecified   . Osteoporosis   . Other and unspecified hyperlipidemia   . TIA (transient ischemic attack) 20    Past Surgical History:  Procedure Laterality Date  . ABDOMINAL HYSTERECTOMY     Fibroids  . arm surgery left    . FRACTURE SURGERY    . TUBAL LIGATION    . WRIST SURGERY      There were no vitals filed for this visit.  Subjective Assessment - 07/21/17 1151    Subjective  Pt reports no changes since Friday.     Limitations  House hold activities;Walking    Patient Stated Goals  "To stop falling"     Currently in Pain?  No/denies          Neuro re-ed: sensory organization test performed with following results: Conditions: 1: 2 normal, 1 below normal (feel that this is likely due to pt trying to have conversation with PT) 2: 2 normal, 1 below normal 3: 2 normal 1 below normal  4: 1 normal, 2 below normal 5: all falls 6: 1 fall, 1 normal, 1 below normal Composite score: 56 Sensory  Analysis Som: normal Vis: normal Vest: none Pref:  Strategy analysis: grossly WFL, some overt ankle when hip strategy needed       COG alignment: grossly WFL              No data recorded       [facility] Adult PT Treatment/Exercise - 07/21/17 0001      Neuro Re-ed    Neuro Re-ed Details   Continue high level balance on compliant surfaces standing on thick blue foam pad placed on incline (ramp) with feet apart EO x 30 secs>EC x 2 sets of 20 secs, feet staggered x 2 sets of 20 secs.  Continued standing on thick blue foam on level ground tapping to cones alternating LEs x 10 reps progressing to tapping two cones before return to ground x 5 reps on each side.  Pt able to complete with intermittent min/guard to stabilize.               PT Education - 07/21/17 1520    Education provided  Yes    Education Details  results of SOT, D/C at next visit.     Person(s) Educated  Patient  Methods  Explanation    Comprehension  Verbalized understanding       PT Short Term Goals - 06/27/17 1554      PT SHORT TERM GOAL #1   Title  =LTGs        PT Long Term Goals - 07/21/17 1216      PT LONG TERM GOAL #1   Title  Pt will be independent with HEP in order to indicate decreased fall risk.  (Target Date: 07/27/17)    Time  4    Period  Weeks    Status  New      PT LONG TERM GOAL #2   Title  Will assess SOT and add appropriate goal as able.      Time  4    Period  Weeks    Status  Achieved      PT LONG TERM GOAL #3   Title  Pt will improve FGA to >29/30 in order to indicate pt is no longer fall risk.     Time  4    Period  Weeks    Status  New      PT LONG TERM GOAL #4   Title  Pt will ambulate over varying surfaces (esp grass) up to 500' at mod I level in order to indicate safe return to leisure and travel.      Time  4    Period  Weeks    Status  New      PT LONG TERM GOAL #5   Title  Pt will improve her SOT composite score to WNL for her age group (~67) to reduce  falls risk.     Baseline  Score improved from 30 to 56 on 07/21/17    Status  Partially Met            Plan - 07/21/17 1521    Clinical Impression Statement  Skilled session focused on beginning to assess LTGs for D/C at next visit.  Assessed SOT during this visit with improvement of composite score from 30 to 56, making marked improvement but still shy of goal.  Note that she continues to have decreased vestibular input, but has made marked improvement with these exercises.   Feel that if she continues to do these at home, bringing feet together when able with EC and head motion, she will continue to see improvement with balance.  Pt verbalized understanding.     Rehab Potential  Excellent    PT Frequency  2x / week    PT Duration  4 weeks    PT Treatment/Interventions  ADLs/Self Care Home Management;Gait training;Stair training;Functional mobility training;Therapeutic activities;Therapeutic exercise;Balance training;Neuromuscular re-education;Patient/family education;Vestibular    PT Next Visit Plan  remaining LTGs and D/c    Consulted and Agree with Plan of Care  Patient       Patient will benefit from skilled therapeutic intervention in order to improve the following deficits and impairments:  Decreased balance, Decreased mobility, Improper body mechanics, Postural dysfunction, Impaired sensation  Visit Diagnosis: Unsteadiness on feet  Repeated falls  Muscle weakness (generalized)     Problem List Patient Active Problem List   Diagnosis Date Noted  . Osteoarthritis of lumbar spine 10/22/2016  . Primary osteoarthritis of both feet 03/09/2016  . Primary osteoarthritis of both knees 03/09/2016  . Degenerative cervical disc 10/04/2014  . Primary osteoarthritis of both hands 08/19/2014  . Osteoporosis 11/13/2012  . Incontinence of urine 04/18/2011  . HYPERLIPIDEMIA-MIXED 01/08/2010  . MULTIPLE  SCLEROSIS 01/08/2010    Cameron Sprang, PT, MPT [facility] [address] [place], [place], [address] Phone: [phone]   Fax:  [phone] 07/21/17, 3:26 PM  Name: Katie Maldonado MRN: [id number] Date of Birth: 1950-06-14

## 2017-07-22 ENCOUNTER — Encounter: Payer: Self-pay | Admitting: Diagnostic Neuroimaging

## 2017-07-23 ENCOUNTER — Encounter: Payer: Self-pay | Admitting: Physical Therapy

## 2017-07-23 ENCOUNTER — Ambulatory Visit: Payer: 59 | Admitting: Physical Therapy

## 2017-07-23 DIAGNOSIS — M6281 Muscle weakness (generalized): Secondary | ICD-10-CM

## 2017-07-23 DIAGNOSIS — R296 Repeated falls: Secondary | ICD-10-CM | POA: Diagnosis not present

## 2017-07-23 DIAGNOSIS — R2681 Unsteadiness on feet: Secondary | ICD-10-CM

## 2017-07-23 MED ORDER — AMPHETAMINE-DEXTROAMPHET ER 20 MG PO CP24: 20.0000 mg | ORAL_CAPSULE | Freq: Every day | ORAL | 0 refills | Status: DC

## 2017-07-23 MED FILL — ADDERALL XR 20 MG CAP SA: 20 | 30 days supply | Qty: 30 | Fill #0

## 2017-07-23 NOTE — Patient Instructions (Addendum)
Feet Apart (Compliant Surface) Varied Arm Positions - Eyes Closed    Stand on compliant surface: __pillows/cushions______ with feet shoulder width apart and arms at your side. Close eyes and visualize upright position. Hold__10-30__ seconds. Repeat ____ times per session. Do ____ sessions per day.  Copyright  VHI. All rights reserved.                 Feet Together (Compliant Surface) Head Motion - Eyes Open and Closed    With eyes open, standing on compliant surface: _pillows/cushions_______, feet together, move head slowly: up and down 10 times and side to side 10 times. Repeat __3__ times per session. Do __1__ sessions per day.  Copyright  VHI. All rights reserved.                   Feet Heel-Toe "Tandem" (Compliant Surface) Varied Arm Positions - Eyes Open    With eyes open, standing on compliant surface: __pillows/cushions______, right foot directly in front of the other and arms at your side, look at a stationary object. Hold __10-30__ seconds. Switch and perform with other foot in front. Repeat __3__ times per session per leg. Do __1__ sessions per day.          Feet Apart (Compliant Surface) Varied Arm Positions - Eyes Closed    Stand on compliant surface: __pillows/cushions______ with feet slightly narrower than shoulder width apart and arms at your side. Close eyes and visualize upright position. Hold__10-30__ seconds. Repeat ____ times per session. Do ____ sessions per day.                     Feet Apart (Compliant Surface) Head Motion - Eyes Closed    Stand on compliant surface: __pillow______ with feet shoulder width or slightly narrower apart. Close eyes and move head slowly, up and down x 10 reps and side to side x 10 reps.  Repeat __1__ times per session. Do __2__ sessions per day.  Copyright  VHI. All rights reserved.    Copyright  VHI. All rights reserved.                      Feet Together (Compliant Surface) Head Motion - Eyes Open    With eyes open, standing on compliant surface: _pillows/cushions_______, feet together, move head slowly: up and down 10 times and side to side 10 times. Repeat __3__ times per session. Do __1__ sessions per day.  Copyright  VHI. All rights reserved.                    Feet Apart (Compliant Surface) Varied Arm Positions - Eyes Closed    Stand on compliant surface: __pillows/cushions______ with feet shoulder width apart and arms at your side. Close eyes and visualize upright position. Hold__10-30__ seconds. Repeat ____ times per session. Do ____ sessions per day.  Copyright  VHI. All rights reserved.                 Feet Together (Compliant Surface) Head Motion - Eyes Open and Closed    With eyes open, standing on compliant surface: _pillows/cushions_______, feet together, move head slowly: up and down 10 times and side to side 10 times. Repeat __3__ times per session. Do __1__ sessions per day.  Copyright  VHI. All rights reserved.                   Feet Heel-Toe "Tandem" (Compliant Surface) Varied Arm Positions - Eyes Open  With eyes open, standing on compliant surface: __pillows/cushions______, right foot directly in front of the other and arms at your side, look at a stationary object. Hold __10-30__ seconds. Switch and perform with other foot in front. Repeat __3__ times per session per leg. Do __1__ sessions per day.          Feet Apart (Compliant Surface) Varied Arm Positions - Eyes Closed    Stand on compliant surface: __pillows/cushions______ with feet slightly narrower than shoulder width apart and arms at your side. Close eyes and visualize upright position. Hold__10-30__ seconds. Repeat ____ times per session. Do ____ sessions per day.                     Feet Apart (Compliant Surface)  Head Motion - Eyes Closed    Stand on compliant surface: __pillow______ with feet shoulder width or slightly narrower apart. Close eyes and move head slowly, up and down x 10 reps and side to side x 10 reps.  Repeat __1__ times per session. Do __2__ sessions per day.  Copyright  VHI. All rights reserved.    Copyright  VHI. All rights reserved.                     Feet Together (Compliant Surface) Head Motion - Eyes Open    With eyes open, standing on compliant surface: _pillows/cushions_______, feet together, move head slowly: up and down 10 times and side to side 10 times. Repeat __3__ times per session. Do __1__ sessions per day.  Copyright  VHI. All rights reserved.

## 2017-07-23 NOTE — Therapy (Addendum)
[facility] [address] [place], [place], [address] Phone: [phone]   Fax:  [phone]  Physical Therapy Treatment and DC Summary  Patient Details  Name: Katie Maldonado MRN: [id number] Date of Birth: November 27, 1950 Referring Provider: Tenna Delaine, PA-C   Encounter Date: 07/23/2017  PT End of Session - 07/23/17 1732    Visit Number  7    Number of Visits  9    Date for PT Re-Evaluation  07/27/17    Authorization Type  cone [employer]    PT Start Time  1403    PT Stop Time  0932 session ended early due to discharge visit, not all time was needed    PT Time Calculation (min)  25 min    Equipment Utilized During Treatment  Gait belt    Activity Tolerance  Patient tolerated treatment well    Behavior During Therapy  [facility] for tasks assessed/performed       Past Medical History:  Diagnosis Date  . Allergy   . Anxiety   . Arthritis   . Diverticulitis   . Diverticulosis 10/2015  . Hypersomnolence   . Migraine   . Multiple sclerosis ([place])   . Neuromuscular disorder ([place])   . Obesity, unspecified   . Osteoporosis   . Other and unspecified hyperlipidemia   . TIA (transient ischemic attack) 20    Past Surgical History:  Procedure Laterality Date  . ABDOMINAL HYSTERECTOMY     Fibroids  . arm surgery left    . FRACTURE SURGERY    . TUBAL LIGATION    . WRIST SURGERY      There were no vitals filed for this visit.  Subjective Assessment - 07/23/17 1406    Subjective  Pt reports no changes since last session, no falls, no pain, and no changes in medications.     Limitations  House hold activities;Walking    Patient Stated Goals  "To stop falling"     Currently in Pain?  No/denies         [facility] PT Assessment - 07/23/17 1412      Functional Gait  Assessment   Gait assessed   Yes    Gait Level Surface  Walks 20 ft in less than 5.5 sec, no assistive devices, good speed, no evidence for imbalance, normal  gait pattern, deviates no more than 6 in outside of the 12 in walkway width.    Change in Gait Speed  Able to smoothly change walking speed without loss of balance or gait deviation. Deviate no more than 6 in outside of the 12 in walkway width.    Gait with Horizontal Head Turns  Performs head turns smoothly with no change in gait. Deviates no more than 6 in outside 12 in walkway width    Gait with Vertical Head Turns  Performs head turns with no change in gait. Deviates no more than 6 in outside 12 in walkway width.    Gait and Pivot Turn  Pivot turns safely within 3 sec and stops quickly with no loss of balance.    Step Over Obstacle  Is able to step over 2 stacked shoe boxes taped together (9 in total height) without changing gait speed. No evidence of imbalance.    Gait with Narrow Base of Support  Is able to ambulate for 10 steps heel to toe with no staggering.    Gait with Eyes Closed  Walks 20 ft, no assistive devices, good speed, no  evidence of imbalance, normal gait pattern, deviates no more than 6 in outside 12 in walkway width. Ambulates 20 ft in less than 7 sec.    Ambulating Backwards  Walks 20 ft, no assistive devices, good speed, no evidence for imbalance, normal gait    Steps  Alternating feet, no rail.    Total Score  30         [facility] Adult PT Treatment/Exercise - 07/23/17 1726      Transfers   Transfers  Sit to Stand;Stand to Sit    Sit to Stand  6: Modified independent (Device/Increase time)    Stand to Sit  6: Modified independent (Device/Increase time)      Ambulation/Gait   Ambulation/Gait  Yes    Ambulation/Gait Assistance  6: Modified independent (Device/Increase time);5: Supervision    Ambulation/Gait Assistance Details  Pt able to ambulate over uneven outdoor surfaces at mod independence for 560 feet with no loss of balance issues. During this gait distance also stepped down the curb and up the curb, up ramp and down ramp on paved sidewalk and parking lot area with  no issues.     Ambulation Distance (Feet)  560 Feet plus 50 feet x 4 reps.     Assistive device  None    Gait Pattern  Within Functional Limits    Ambulation Surface  Level;Indoor;Unlevel;Outdoor;Grass;Paved;Gravel    Stairs  Yes    Stairs Assistance  7: Independent;5: Supervision    Stairs Assistance Details (indicate cue type and reason)  x 1 rep ascending and descending stairs without use of hands.     Stair Management Technique  No rails;Alternating pattern;Forwards    Number of Stairs  4    Height of Stairs  6    Ramp  5: Supervision    Ramp Details (indicate cue type and reason)  During gait outdoors    Curb  5: Supervision    Curb Details (indicate cue type and reason)  During gait outdoors        PT Education - 07/23/17 1432    Education provided  Yes    Education Details  Revised HEP since start of PT. Modified and printed out new handout for patient.     Person(s) Educated  Patient    Methods  Explanation    Comprehension  Verbalized understanding       Feet Apart (Compliant Surface) Varied Arm Positions - Eyes Closed   Stand on compliant surface: __pillows/cushions______ with feet shoulder width apart and arms at your side. Close eyes and visualize upright position. Hold__10-30__ seconds. Repeat ____ times per session. Do ____ sessions per day.                 Feet Together (Compliant Surface) Head Motion - Eyes Open and Closed    With eyes open, standing on compliant surface: _pillows/cushions_______, feet together, move head slowly: up and down 10 times and side to side 10 times. Repeat __3__ times per session. Do __1__ sessions per day.  Copyright  VHI. All rights reserved.                      Feet Apart (Compliant Surface) Varied Arm Positions - Eyes Closed    Stand on compliant surface: __pillows/cushions______ with feet slightly narrower than shoulder width apart and arms at your side. Close eyes and visualize upright  position. Hold__10-30__ seconds. Repeat ____ times per session. Do ____ sessions per day.  Feet Apart (Compliant Surface) Head Motion - Eyes Closed    Stand on compliant surface: __pillow______ with feet shoulder width or slightly narrower apart. Close eyes and move head slowly, up and down x 10 reps and side to side x 10 reps.  Repeat __1__ times per session. Do __2__ sessions per day.                    Feet Together (Compliant Surface) Head Motion - Eyes Open    With eyes open, standing on compliant surface: _pillows/cushions_______, feet together, move head slowly: up and down 10 times and side to side 10 times. Repeat __3__ times per session. Do __1__ sessions per day.   Feet Heel-Toe "Tandem" (Compliant Surface) Varied Arm Positions - Eyes Open    With eyes open, standing on compliant surface: __pillows/cushions______, right foot directly in front of the other and arms at your side, look at a stationary object. Hold __10-30__ seconds. Switch and perform with other foot in front. Repeat __3__ times per session per leg. Do __1__ sessions per day.                      PT Short Term Goals - 06/27/17 1554      PT SHORT TERM GOAL #1   Title  =LTGs        PT Long Term Goals - 07/23/17 1407      PT LONG TERM GOAL #1   Title  Pt will be independent with HEP in order to indicate decreased fall risk.  (Target Date: 07/27/17)    Baseline  07/23/17: Pt verbalized independent with HEP to indicate decreased fall risk.     Status  Achieved    Target Date  07/27/17      PT LONG TERM GOAL #2   Title  Will assess SOT and add appropriate goal as able.      Baseline  07/21/17: 45, 48 degrees rotation    Status  Achieved    Target Date  07/27/17      PT LONG TERM GOAL #3   Title  Pt will improve FGA to >29/30 in order to indicate pt is no longer fall risk.     Baseline  07/24/17: 30/30 FGA score today indicating pt is no longer fall  risk.     Status  Achieved    Target Date  07/27/17      PT LONG TERM GOAL #4   Title  Pt will ambulate over varying surfaces (esp grass) up to 500' at mod I level in order to indicate safe return to leisure and travel.      Baseline  07/23/17: Pt able to ambulate 560 feet over varying surfaces (esp grass) at mod I level in order to indicate safe return to leisure and travel.      Time  4    Period  Weeks    Status  Achieved    Target Date  07/27/17      PT LONG TERM GOAL #5   Title  Pt will improve her SOT composite score to WNL for her age group (~67) to reduce falls risk.     Baseline  Score improved from 30 to 56 on 07/21/17    Period  Weeks    Status  Partially Met    Target Date  07/27/17            Plan - 07/23/17 1736    Clinical Impression Statement  Today's session focused on checking remaining LTG's to be able to discharge today. Pt was able to acheive today LTG's #1,3, and 4 regarding HEP independence, FGA for decreased fall risk, and ambulation over 500 feet outdoors safely. Pt understood importance of continuing to perform home exercises for balance and gait safety.     Clinical Presentation  Stable    Clinical Decision Making  Low    Rehab Potential  Excellent    PT Frequency  2x / week    PT Duration  4 weeks    PT Treatment/Interventions  ADLs/Self Care Home Management;Gait training;Stair training;Functional mobility training;Therapeutic activities;Therapeutic exercise;Balance training;Neuromuscular re-education;Patient/family education;Vestibular    PT Next Visit Plan  Discharged today.     Consulted and Agree with Plan of Care  Patient       Patient will benefit from skilled therapeutic intervention in order to improve the following deficits and impairments:  Decreased balance, Decreased mobility, Improper body mechanics, Postural dysfunction, Impaired sensation  Visit Diagnosis: Unsteadiness on feet  Repeated falls  Muscle weakness  (generalized)  PHYSICAL THERAPY DISCHARGE SUMMARY  Visits from Start of Care: 7  Current functional level related to goals / functional outcomes: See LTGs above   Remaining deficits: Pt continues to have decreased vestibular system input, but has demonstrated improvement and has HEP to continue to address deficits.    Education / Equipment: HEP, see above  Plan: Patient agrees to discharge.  Patient goals were partially met. Patient is being discharged due to meeting the stated rehab goals.  ?????        D/C Summary done by:  Katie Maldonado, PT, MPT [facility] [address] [place], [place], [address] Phone: [phone]   Fax:  [phone] 07/24/17, 1:00 PM    Problem List Patient Active Problem List   Diagnosis Date Noted  . Osteoarthritis of lumbar spine 10/22/2016  . Primary osteoarthritis of both feet 03/09/2016  . Primary osteoarthritis of both knees 03/09/2016  . Degenerative cervical disc 10/04/2014  . Primary osteoarthritis of both hands 08/19/2014  . Osteoporosis 11/13/2012  . Incontinence of urine 04/18/2011  . HYPERLIPIDEMIA-MIXED 01/08/2010  . MULTIPLE SCLEROSIS 01/08/2010    Katie Maldonado, SPTA 07/24/2017, 12:06 PM  [facility] [address] [place], [place], [address] Phone: [phone]   Fax:  [phone]  Name: Katie Maldonado MRN: [id number] Date of Birth: Dec 16, 1950

## 2017-07-30 ENCOUNTER — Ambulatory Visit: Payer: 59 | Admitting: Physical Therapy

## 2017-08-01 ENCOUNTER — Ambulatory Visit: Payer: 59 | Admitting: Rehabilitation

## 2017-08-04 ENCOUNTER — Ambulatory Visit: Payer: 59 | Admitting: Rehabilitation

## 2017-08-04 MED FILL — CITALOPRAM HBR 20 MG TABLET: 20 | 90 days supply | Qty: 90 | Fill #2

## 2017-08-04 MED FILL — OMEPRAZOLE 20 MG CAP: 20 | 90 days supply | Qty: 90 | Fill #2

## 2017-08-11 ENCOUNTER — Ambulatory Visit
Admission: RE | Admit: 2017-08-11 | Discharge: 2017-08-11 | Disposition: A | Discharge status: Home / Self Care | Payer: 59 | Source: Ambulatory Visit | Attending: Physician Assistant | Admitting: Physician Assistant

## 2017-08-11 DIAGNOSIS — R234 Changes in skin texture: Secondary | ICD-10-CM

## 2017-08-11 DIAGNOSIS — N6489 Other specified disorders of breast: Secondary | ICD-10-CM | POA: Diagnosis not present

## 2017-08-11 DIAGNOSIS — R928 Other abnormal and inconclusive findings on diagnostic imaging of breast: Secondary | ICD-10-CM | POA: Diagnosis not present

## 2017-08-22 ENCOUNTER — Encounter: Payer: Self-pay | Admitting: Diagnostic Neuroimaging

## 2017-08-22 MED ORDER — AMPHETAMINE-DEXTROAMPHET ER 20 MG PO CP24: 20.0000 mg | ORAL_CAPSULE | Freq: Every day | ORAL | 0 refills | Status: DC

## 2017-08-22 MED FILL — ADDERALL XR 20 MG CAP SA: 20 | 30 days supply | Qty: 30 | Fill #0

## 2017-08-22 NOTE — Telephone Encounter (Signed)
Rx for Adderall XR 20 mg once daily filled electronically.

## 2017-08-28 ENCOUNTER — Ambulatory Visit: Payer: 59 | Admitting: Rheumatology

## 2017-09-01 ENCOUNTER — Encounter: Payer: Self-pay | Admitting: Diagnostic Neuroimaging

## 2017-09-02 ENCOUNTER — Encounter: Payer: Self-pay | Admitting: Physician Assistant

## 2017-09-05 ENCOUNTER — Other Ambulatory Visit: Payer: Self-pay

## 2017-09-05 ENCOUNTER — Telehealth: Payer: Self-pay | Admitting: Diagnostic Neuroimaging

## 2017-09-05 ENCOUNTER — Emergency Department (HOSPITAL_COMMUNITY): Admission: EM | Admit: 2017-09-05 | Discharge: 2017-09-05 | Discharge status: Against Medical Advice | Payer: 59

## 2017-09-05 NOTE — Telephone Encounter (Signed)
Agree with plan to stop tecfidera. Agree with ER evaluation. -VRP

## 2017-09-05 NOTE — Telephone Encounter (Signed)
RN saw pt in the lobby. Patient's skin bright red over chest, arms. She reported redness from her upper thighs and above. She also reported some shortness of breath but denied itchy throat or feeling of swelling in throat. She stated that this all happened last time and went away in about 3 hours. She had stopped the medication recently as well because of the reaction, stopped all of her medications actually, because she wasn't sure exactly which medication it was. Then today she took the Tecfidera and the reaction happened again. With previous reaction pt was told by both Tecfidera and another nurse to go to ED but pt stated she didn't want to pay the $400. RN advised pt to go to the ED now and report her symptoms of allergic reaction including SOB. She should not wait for the symptoms to resolve and will likely need medication to treat the symptoms. Pt aware that Dr. Leta Baptist was not here at the time she was in the lobby. Pt verbalized understanding of instructions to go to the ED and she left. She wants Dr. Leta Baptist to know that she does not want to take that medication anymore.

## 2017-09-05 NOTE — ED Notes (Signed)
Patient exiting emergency department, told EMT first that she didn't want to wait to be seen and, if things worsened, she would come back. Respirations noted to be equal and unlabored upon first assessment as patient checked in, patient ambulatory with steady gait. No apparent distress noted.

## 2017-09-05 NOTE — Telephone Encounter (Signed)
Patient is in the lobby with an allergic reaction to Tecfidera she wants Dr. Leta Baptist to see. She took the medication this morning and skin is bright red and itching all over. Best call back is [phone]

## 2017-09-05 NOTE — ED Notes (Signed)
Patient stated she didn't want to wait, that if her condition get worst that she will come back.

## 2017-09-09 ENCOUNTER — Encounter: Payer: Self-pay | Admitting: Diagnostic Neuroimaging

## 2017-09-30 ENCOUNTER — Other Ambulatory Visit: Payer: Self-pay | Admitting: Physician Assistant

## 2017-09-30 ENCOUNTER — Ambulatory Visit: Payer: 59 | Admitting: Physician Assistant

## 2017-09-30 ENCOUNTER — Other Ambulatory Visit: Payer: Self-pay | Admitting: Neurology

## 2017-09-30 VITALS — BP 116/72 | HR 88 | Temp 98.5°F | Ht 65.0 in | Wt 167.0 lb

## 2017-09-30 DIAGNOSIS — R609 Edema, unspecified: Secondary | ICD-10-CM | POA: Diagnosis not present

## 2017-09-30 DIAGNOSIS — R110 Nausea: Secondary | ICD-10-CM

## 2017-09-30 LAB — POCT URINALYSIS DIP (MANUAL ENTRY)
Bilirubin, UA: NEGATIVE
GLUCOSE UA: NEGATIVE mg/dL
Ketones, POC UA: NEGATIVE mg/dL
NITRITE UA: NEGATIVE
PROTEIN UA: NEGATIVE mg/dL
RBC UA: NEGATIVE
Spec Grav, UA: 1.015 (ref 1.010–1.025)
Urobilinogen, UA: 0.2 E.U./dL
pH, UA: 6.5 (ref 5.0–8.0)

## 2017-09-30 NOTE — Progress Notes (Addendum)
Katie Maldonado  MRN: [id number] DOB: [birth year]  Subjective:  Katie Maldonado is a 67 y.o. female seen in office today for a chief complaint of b/l ankle edema x 3 weeks. Has some "puffiness in face and hands" as well. Gets worse throughout the day. Cannot identify anything that makes it better. Has had this happen to her in the past but it will just go away. Denies lower leg pain, redness, warmth, chest pain, SOB, DOE, orthopnea, PND, heart palpitations, and decreased urine output. Has been eating salty foods. Drinks about 30 oz of water a day. Denies new use of medication. No recent surgery/travel/immobilization, hx of cancer, hemoptysis, prior DVT/PE, or hormone use.  Review of Systems  Constitutional: Negative for chills, diaphoresis, fever and unexpected weight change.  Endocrine:       +thinning hair  Genitourinary: Negative for difficulty urinating, dysuria and frequency.  Neurological: Negative for dizziness and light-headedness.    Patient Active Problem List   Diagnosis Date Noted  . Osteoarthritis of lumbar spine 10/22/2016  . Primary osteoarthritis of both feet 03/09/2016  . Primary osteoarthritis of both knees 03/09/2016  . Degenerative cervical disc 10/04/2014  . Primary osteoarthritis of both hands 08/19/2014  . Osteoporosis 11/13/2012  . Incontinence of urine 04/18/2011  . HYPERLIPIDEMIA-MIXED 01/08/2010  . MULTIPLE SCLEROSIS 01/08/2010    Current Outpatient Medications on File Prior to Visit  Medication Sig Dispense Refill  . alendronate (FOSAMAX) 10 MG tablet Take 1 tablet (10 mg total) by mouth daily before breakfast. Take with a full glass of water on an empty stomach. Avoid lying down x 30 min. 90 tablet 3  . calcium acetate (PHOSLO) 667 MG capsule Take by mouth 3 (three) times daily with meals.    . Cholecalciferol (VITAMIN D3) 5000 units CAPS Take by mouth daily.    . citalopram (CELEXA) 20 MG tablet Take 1 tablet (20 mg total) by mouth daily. 90  tablet 3  . diclofenac sodium (VOLTAREN) 1 % GEL Place 1 % onto the skin.    . Dimethyl Fumarate (TECFIDERA) 240 MG CPDR Take 1 capsule (240 mg total) by mouth 2 (two) times daily. 180 capsule 1  . ibuprofen (ADVIL,MOTRIN) 200 MG tablet Take 2 tablets (400 mg total) by mouth 4 (four) times daily.    . Omega-3 Fatty Acids (FISH OIL) 1000 MG CAPS Take by mouth daily.    Marland Kitchen omeprazole (PRILOSEC) 20 MG capsule Take 1 capsule (20 mg total) by mouth daily. 90 capsule 3  . ondansetron (ZOFRAN-ODT) 4 MG disintegrating tablet Take 1 tablet (4 mg total) every 8 (eight) hours as needed by mouth for nausea or vomiting. DISSOLVE 1 TABLET BY MOUTH EVERY 8 HOURS AS NEEDED FOR NAUSEA AND VOMITING 60 tablet 0   No current facility-administered medications on file prior to visit.     Allergies  Allergen Reactions  . Nitrofurantoin Shortness Of Breath    Shortness of breath, violent chills, dizziness, nausea  . Sulfonamide Derivatives Shortness Of Breath    Shortness of breath, chills  . Betaseron [Interferon Beta-1b]     Shortness of breath, rash, heart palpatations  . Cefdinir     Can't remember what rx had 2 yrs ago  . Copaxone [Glatiramer Acetate]      Objective:  BP 116/72 (BP Location: Left Arm, Patient Position: Sitting, Cuff Size: Normal)   Pulse 88   Temp 98.5 F (36.9 C) (Oral)   Ht _0  (1.651 m)   Wt 167 lb (  75.8 kg)   SpO2 99%   BMI 27.79 kg/m   Physical Exam  Constitutional: She is oriented to person, place, and time. She appears well-developed and well-nourished. No distress.  HENT:  Head: Normocephalic and atraumatic.  Mild indentation in face where glasses are sitting.  Eyes: Conjunctivae are normal.  Neck: Normal range of motion.  Cardiovascular: Normal rate, regular rhythm, normal heart sounds and intact distal pulses.  Pulses:      Dorsalis pedis pulses are 2+ on the right side, and 2+ on the left side.       Posterior tibial pulses are 2+ on the right side, and 2+ on  the left side.  No JVD noted.   Pulmonary/Chest: Effort normal and breath sounds normal. She has no wheezes. She has no rhonchi. She has no rales.  Abdominal: Normal appearance. There is no tenderness.  Musculoskeletal:       Right shoulder: Swelling:         Right ankle: She exhibits swelling. She exhibits normal range of motion and normal pulse. No tenderness. Achilles tendon normal.       Left ankle: She exhibits swelling. She exhibits normal range of motion. No tenderness. Achilles tendon normal.       Right lower leg: She exhibits no tenderness and no bony tenderness.       Left lower leg: She exhibits no tenderness and no bony tenderness.  2+ pitting edema noted in bilateral feet and ankles. 1+ edema to mid shins  Ankle and calf circumference equal b/l.     Neurological: She is alert and oriented to person, place, and time. No sensory deficit.  Strength 5/5 in BLE.   Skin: Skin is warm and dry.  Negative hair pull test  No overlying erythema, warmth, or TTP of BLE.  Psychiatric: She has a normal mood and affect.  Vitals reviewed.  Wt Readings from Last 3 Encounters:  09/30/17 167 lb (75.8 kg)  06/26/17 166 lb (75.3 kg)  06/18/17 157 lb (71.2 kg)   No results found for this or any previous visit (from the past 24 hour(s)).   Assessment and Plan :  1. Edema, unspecified type Pt is overall well appearing, NAD. Vitals stable. Lungs CTAB. No JVD. Labs pending. No acute findings noted on exam. Likely due to heat, increased salt intake, and decreased water consumption.Recommend decrease salt intake, increase water consumption, leg elevation, and f/u in one week. Given strict ED precautions.  - POCT urinalysis dipstick - CMP14+EGFR - TSH    Tenna Delaine PA-C  [facility] 09/30/2017 4:27 PM

## 2017-09-30 NOTE — Patient Instructions (Addendum)
Increase water to at least 60 oz a day. Decrease salt intake. Elevate legs. Follow up in one week for reevaluation. If any of your symptoms worsen or you develop new pain in your leg, redness, warmth, chest pain, or shortness of breath,  please seek care immediately.      Edema Edema is when you have too much fluid in your body or under your skin. Edema may make your legs, feet, and ankles swell up. Swelling is also common in looser tissues, like around your eyes. This is a common condition. It gets more common as you get older. There are many possible causes of edema. Eating too much salt (sodium) and being on your feet or sitting for a long time can cause edema in your legs, feet, and ankles. Hot weather may make edema worse. Edema is usually painless. Your skin may look swollen or shiny. Follow these instructions at home:  Keep the swollen body part raised (elevated) above the level of your heart when you are sitting or lying down.  Do not sit still or stand for a long time.  Do not wear tight clothes. Do not wear garters on your upper legs.  Exercise your legs. This can help the swelling go down.  Wear elastic bandages or support stockings as told by your doctor.  Eat a low-salt (low-sodium) diet to reduce fluid as told by your doctor.  Depending on the cause of your swelling, you may need to limit how much fluid you drink (fluid restriction).  Take over-the-counter and prescription medicines only as told by your doctor. Contact a doctor if:  Treatment is not working.  You have heart, liver, or kidney disease and have symptoms of edema.  You have sudden and unexplained weight gain. Get help right away if:  You have shortness of breath or chest pain.  You cannot breathe when you lie down.  You have pain, redness, or warmth in the swollen areas.  You have heart, liver, or kidney disease and get edema all of a sudden.  You have a fever and your symptoms get worse all of a  sudden. Summary  Edema is when you have too much fluid in your body or under your skin.  Edema may make your legs, feet, and ankles swell up. Swelling is also common in looser tissues, like around your eyes.  Raise (elevate) the swollen body part above the level of your heart when you are sitting or lying down.  Follow your doctor's instructions about diet and how much fluid you can drink (fluid restriction). This information is not intended to replace advice given to you by your health care provider. Make sure you discuss any questions you have with your health care provider. Document Released: 10/02/2007 Document Revised: 05/03/2016 Document Reviewed: 05/03/2016 Elsevier Interactive Patient Education  2017 [employer].   IF you received an x-ray today, you will receive an invoice from [facility]. Please contact [facility] at [phone] with questions or concerns regarding your invoice.   IF you received labwork today, you will receive an invoice from [facility]. Please contact [facility] at [phone] with questions or concerns regarding your invoice.   Our billing staff will not be able to assist you with questions regarding bills from these companies.  You will be contacted with the lab results as soon as they are available. The fastest way to get your results is to activate your My Chart account. Instructions are located on the last page of this paperwork. If  you have not heard from [place] regarding the results in 2 weeks, please contact this office.

## 2017-10-01 ENCOUNTER — Other Ambulatory Visit: Payer: Self-pay | Admitting: Neurology

## 2017-10-01 ENCOUNTER — Telehealth: Payer: Self-pay | Admitting: *Deleted

## 2017-10-01 LAB — CMP14+EGFR
ALBUMIN: 4.3 g/dL (ref 3.6–4.8)
ALK PHOS: 79 IU/L (ref 39–117)
ALT: 18 IU/L (ref 0–32)
AST: 21 IU/L (ref 0–40)
Albumin/Globulin Ratio: 1.8 (ref 1.2–2.2)
BILIRUBIN TOTAL: 0.4 mg/dL (ref 0.0–1.2)
BUN / CREAT RATIO: 18 (ref 12–28)
BUN: 13 mg/dL (ref 8–27)
CHLORIDE: 100 mmol/L (ref 96–106)
CO2: 25 mmol/L (ref 20–29)
CREATININE: 0.72 mg/dL (ref 0.57–1.00)
Calcium: 9.4 mg/dL (ref 8.7–10.3)
GFR calc Af Amer: 102 mL/min/{1.73_m2} (ref >59)
GFR calc non Af Amer: 88 mL/min/{1.73_m2} (ref >59)
GLOBULIN, TOTAL: 2.4 g/dL (ref 1.5–4.5)
GLUCOSE: 97 mg/dL (ref 65–99)
Potassium: 4.3 mmol/L (ref 3.5–5.2)
SODIUM: 139 mmol/L (ref 134–144)
Total Protein: 6.7 g/dL (ref 6.0–8.5)

## 2017-10-01 LAB — TSH: TSH: 3.78 u[IU]/mL (ref 0.450–4.500)

## 2017-10-01 MED FILL — ONDANSETRON ODT 4 MG TABLET: 4 | 20 days supply | Qty: 60 | Fill #0

## 2017-10-01 NOTE — Telephone Encounter (Signed)
Adderall XR refill Rx successfully faxed to [facility].

## 2017-10-01 NOTE — Telephone Encounter (Signed)
Please advise/refill: ondansetron (ZOFRAN-ODT)

## 2017-10-01 NOTE — Telephone Encounter (Addendum)
Rx on Dr [employer] desk with note to e scribe.  Dr Leta Baptist unable to e scribe at this time. Called patient and LVM to inform her she'll have to pick up Rx at front desk. Left number for any questions. Rx placed at front desk.

## 2017-10-01 NOTE — Telephone Encounter (Signed)
Baxter Flattery with [facility] received a fax for Rx ADDERALL XR 20 MG 24 hr capsule but Rx has to be e scribed or written. They do not accept faxed.

## 2017-10-03 ENCOUNTER — Encounter: Payer: Self-pay | Admitting: Physician Assistant

## 2017-10-03 MED FILL — ADDERALL XR 20 MG CAP SA: 20 | 30 days supply | Qty: 30 | Fill #0

## 2017-10-06 ENCOUNTER — Telehealth: Payer: Self-pay | Admitting: *Deleted

## 2017-10-06 NOTE — Telephone Encounter (Signed)
Received fax from [facility] re: [facility] ([place] source) closed operations 10/03/17. Prescription referrals for specialty medications need to be sent to [facility].  [facility] will send referral , along with member's benefit and eligibility information, to a network pharmacy.  Also received fax from [facility] re: thank you for referring patient to [facility] for MS therapy. They will initiate insurance investigation to determine type of medication coverage, any PA requirements and potential financial assistance options. Will update with any new information.

## 2017-10-09 ENCOUNTER — Ambulatory Visit: Payer: 59 | Admitting: Physician Assistant

## 2017-10-09 ENCOUNTER — Encounter: Payer: Self-pay | Admitting: Physician Assistant

## 2017-10-09 ENCOUNTER — Other Ambulatory Visit: Payer: Self-pay

## 2017-10-09 ENCOUNTER — Ambulatory Visit (INDEPENDENT_AMBULATORY_CARE_PROVIDER_SITE_OTHER): Payer: 59

## 2017-10-09 VITALS — BP 120/68 | HR 80 | Temp 98.8°F | Resp 18 | Ht 64.57 in | Wt 164.2 lb

## 2017-10-09 DIAGNOSIS — R609 Edema, unspecified: Secondary | ICD-10-CM

## 2017-10-09 DIAGNOSIS — R0602 Shortness of breath: Secondary | ICD-10-CM | POA: Diagnosis not present

## 2017-10-09 DIAGNOSIS — Z23 Encounter for immunization: Secondary | ICD-10-CM

## 2017-10-09 DIAGNOSIS — L659 Nonscarring hair loss, unspecified: Secondary | ICD-10-CM

## 2017-10-09 NOTE — Progress Notes (Signed)
Katie Maldonado  MRN: [id number] DOB: 1950-10-11  Subjective:  Katie Maldonado is a 67 y.o. female seen in office today for a chief complaint of follow-up on lower extremity edema and need for pneumonia vaccine.  Patient last seen on 09/30/2017.  Was having bilateral ankle edema for 3 weeks.  Notes it got worse throughout the day.  Not having any other symptoms.  Only drinking 30 ounces of water per day.  Was eating more  salty foods. Labs are collected.  Physical exam findings are consistent with dependent edema.  Recommended decreasing salt consumption, increasing water consumption, and elevating legs.  Follow-up in 1 week for reevaluation.  Please see that note for additional details.  Today, she notes that the swelling is much improved and has basically resolved.  She has elevated her feet daily, decrease salt, and increase water.  She does note that she will sometimes feel mildly short of breath when walking.  This is been going on for months. She is not as active as she used to be.  Denies shortness of breath at rest, chest pain, orthopnea, PND, and hemoptysis.  In terms of hair thinning, she notes she can see her scalp more than she used to.  She is using Nexus shampoo.  Takes biotin daily.   Review of Systems  Respiratory: Negative for cough and wheezing.   Cardiovascular: Negative for palpitations.  Gastrointestinal: Negative for nausea and vomiting.  Endocrine: Negative for cold intolerance, heat intolerance, polydipsia and polyphagia.  Neurological: Negative for light-headedness.    Patient Active Problem List   Diagnosis Date Noted  . Osteoarthritis of lumbar spine 10/22/2016  . Primary osteoarthritis of both feet 03/09/2016  . Primary osteoarthritis of both knees 03/09/2016  . Degenerative cervical disc 10/04/2014  . Primary osteoarthritis of both hands 08/19/2014  . Osteoporosis 11/13/2012  . Incontinence of urine 04/18/2011  . HYPERLIPIDEMIA-MIXED 01/08/2010  .  MULTIPLE SCLEROSIS 01/08/2010    Current Outpatient Medications on File Prior to Visit  Medication Sig Dispense Refill  . ADDERALL XR 20 MG 24 hr capsule TAKE 1 CAPSULE BY MOUTH ONCE DAILY 30 capsule 0  . alendronate (FOSAMAX) 10 MG tablet Take 1 tablet (10 mg total) by mouth daily before breakfast. Take with a full glass of water on an empty stomach. Avoid lying down x 30 min. 90 tablet 3  . calcium acetate (PHOSLO) 667 MG capsule Take by mouth 3 (three) times daily with meals.    . Cholecalciferol (VITAMIN D3) 5000 units CAPS Take by mouth daily.    . citalopram (CELEXA) 20 MG tablet Take 1 tablet (20 mg total) by mouth daily. 90 tablet 3  . diclofenac sodium (VOLTAREN) 1 % GEL Place 1 % onto the skin.    . Dimethyl Fumarate (TECFIDERA) 240 MG CPDR Take 1 capsule (240 mg total) by mouth 2 (two) times daily. 180 capsule 1  . ibuprofen (ADVIL,MOTRIN) 200 MG tablet Take 2 tablets (400 mg total) by mouth 4 (four) times daily.    . Omega-3 Fatty Acids (FISH OIL) 1000 MG CAPS Take by mouth daily.    Marland Kitchen omeprazole (PRILOSEC) 20 MG capsule Take 1 capsule (20 mg total) by mouth daily. 90 capsule 3  . ondansetron (ZOFRAN-ODT) 4 MG disintegrating tablet DISSOLVE 1 TABLET BY MOUTH EVERY 8 HOURS AS NEEDED FOR NAUSEA AND VOMITING 60 tablet 0   No current facility-administered medications on file prior to visit.     Allergies  Allergen Reactions  . Nitrofurantoin Shortness  Of Breath    Shortness of breath, violent chills, dizziness, nausea  . Sulfonamide Derivatives Shortness Of Breath    Shortness of breath, chills  . Betaseron [Interferon Beta-1b]     Shortness of breath, rash, heart palpatations  . Cefdinir     Can't remember what rx had 2 yrs ago  . Copaxone [Glatiramer Acetate]       Social History   Socioeconomic History  . Marital status: Married    Spouse name: Katie Maldonado"  . Number of children: 2  . Years of education: college  . Highest education level: Not on file  Occupational  History  . Occupation: medical records    Employer: [employer]  . Occupation: [employer]/HEALTH INFORMATION    Employer: [employer]  . Financial resource strain: Not on file  . Food insecurity:    Worry: Not on file    Inability: Not on file  . Transportation needs:    Medical: Not on file    Non-medical: Not on file  Tobacco Use  . Smoking status: Former Smoker    Years: 37.00    Types: Cigarettes    Last attempt to quit: 02/26/2004    Years since quitting: 13.6  . Smokeless tobacco: Never Used  Substance and Sexual Activity  . Alcohol use: Yes    Comment: occasionally   . Drug use: No  . Sexual activity: Yes    Birth control/protection: None  Lifestyle  . Physical activity:    Days per week: 3 days    Minutes per session: 30 min  . Stress: Not at all  Relationships  . Social connections:    Talks on phone: More than three times a week    Gets together: More than three times a week    Attends religious service: 1 to 4 times per year    Active member of club or organization: Yes    Attends meetings of clubs or organizations: More than 4 times per year    Relationship status: Married  . Intimate partner violence:    Fear of current or ex partner: No    Emotionally abused: No    Physically abused: No    Forced sexual activity: No  Other Topics Concern  . Not on file  Social History Narrative   Patient is married Katie Maldonado), has 2 children   Patient is right handed   Education level is 2 yrs of college   Caffeine consumption is 3-4 cups daily    Objective:  BP 120/68 (BP Location: Left Arm, Patient Position: Sitting, Cuff Size: Normal)   Pulse 80   Temp 98.8 F (37.1 C) (Oral)   Resp 18   Ht 5' 4.57" (1.64 m)   Wt 164 lb 3.2 oz (74.5 kg)   SpO2 98%   BMI 27.69 kg/m    Physical Exam  Constitutional: She is oriented to person, place, and time. She appears well-developed and well-nourished. No distress.  HENT:  Head: Normocephalic and  atraumatic.  Eyes: Conjunctivae are normal.  Neck: Normal range of motion.  Cardiovascular: Normal rate, regular rhythm, normal heart sounds and intact distal pulses.  Pulmonary/Chest: Effort normal and breath sounds normal. She has no decreased breath sounds. She has no wheezes. She has no rhonchi. She has no rales.  Neurological: She is alert and oriented to person, place, and time.  Skin: Skin is warm and dry.  Visual appearance of ankles and feet have improved since initial visit in terms  of swelling.   1+ pitting edema in b/l feet, ankle, and lower extremities to a  few inches below mid shin.   No surrounding erythema, warmth, or TTP of BLE.   Psychiatric: She has a normal mood and affect.  Vitals reviewed.    BP Readings from Last 3 Encounters:  10/09/17 120/68  09/30/17 116/72  06/26/17 111/75   Wt Readings from Last 3 Encounters:  10/09/17 164 lb 3.2 oz (74.5 kg)  09/30/17 167 lb (75.8 kg)  06/26/17 166 lb (75.3 kg)   Dg Chest 2 View  Result Date: 10/09/2017 CLINICAL DATA:  Shortness of Breath EXAM: CHEST - 2 VIEW COMPARISON:  04/30/2017 FINDINGS: Tortuosity of the thoracic aorta. Heart and mediastinal contours are within normal limits. No focal opacities or effusions. No acute bony abnormality. IMPRESSION: No active cardiopulmonary disease. Electronically Signed   By: Rolm Baptise M.D.   On: 10/09/2017 09:20   EKG shows NSR with rate of 61 bpm. PR and QRS intervals within normal limits. No acute changes noted from prior EKG of 2012. Findings presented and discussed with Dr. Pamella Pert.    Assessment and Plan :  1. Edema, unspecified type Improved ove with r the last week.  Most consistent with dependent edema.  Recommended to continue with current recommendations.  Labs were all normal.  Will check CBC today.  - CBC with Differential/Platelet - Pathologist smear review  2. SOB (shortness of breath) Patient is overall well-appearing, no acute distress.  Vitals stable.   Lungs CTAB. EKG normal. Chest x-ray with no cardiomegaly or active cardiopulmonary disease. Patient reassured.  Likely due to deconditioning. - DG Chest 2 View; Future - EKG 12-Lead  3. Need for 23-polyvalent pneumococcal polysaccharide vaccine - Pneumococcal polysaccharide vaccine 23-valent greater than or equal to 2yo subcutaneous/IM  4. Hair thinning Labs from last visit were normal.  Negative hair pull test.  Likely normal for age.  Will check iron levels at this time.  Recommended using sulfate free shampoo and stop use of biotin. - Iron, TIBC and Ferritin Panel - Iron, TIBC and Ferritin Panel   Tenna Delaine PA-C  [facility] 10/09/2017 8:14 AM

## 2017-10-09 NOTE — Patient Instructions (Addendum)
We have checked some blood work today and should have those results back within one week. Your EKG looked great. I recommend continuing limiting salt, propping your feet up, and drinking water. We will contact you within your chest xray results.   For hair thinning, switch to shampoo without sulfates. Stop biotin pills. We will have your iron levels back in a week and will contact you.   Thank you for letting me participate in your health and well being.    Edema Edema is when you have too much fluid in your body or under your skin. Edema may make your legs, feet, and ankles swell up. Swelling is also common in looser tissues, like around your eyes. This is a common condition. It gets more common as you get older. There are many possible causes of edema. Eating too much salt (sodium) and being on your feet or sitting for a long time can cause edema in your legs, feet, and ankles. Hot weather may make edema worse. Edema is usually painless. Your skin may look swollen or shiny. Follow these instructions at home:  Keep the swollen body part raised (elevated) above the level of your heart when you are sitting or lying down.  Do not sit still or stand for a long time.  Do not wear tight clothes. Do not wear garters on your upper legs.  Exercise your legs. This can help the swelling go down.  Wear elastic bandages or support stockings as told by your doctor.  Eat a low-salt (low-sodium) diet to reduce fluid as told by your doctor.  Depending on the cause of your swelling, you may need to limit how much fluid you drink (fluid restriction).  Take over-the-counter and prescription medicines only as told by your doctor. Contact a doctor if:  Treatment is not working.  You have heart, liver, or kidney disease and have symptoms of edema.  You have sudden and unexplained weight gain. Get help right away if:  You have shortness of breath or chest pain.  You cannot breathe when you lie  down.  You have pain, redness, or warmth in the swollen areas.  You have heart, liver, or kidney disease and get edema all of a sudden.  You have a fever and your symptoms get worse all of a sudden. Summary  Edema is when you have too much fluid in your body or under your skin.  Edema may make your legs, feet, and ankles swell up. Swelling is also common in looser tissues, like around your eyes.  Raise (elevate) the swollen body part above the level of your heart when you are sitting or lying down.  Follow your doctor's instructions about diet and how much fluid you can drink (fluid restriction). This information is not intended to replace advice given to you by your health care provider. Make sure you discuss any questions you have with your health care provider. Document Released: 10/02/2007 Document Revised: 05/03/2016 Document Reviewed: 05/03/2016 Elsevier Interactive Patient Education  2017 [employer].    IF you received an x-ray today, you will receive an invoice from [facility]. Please contact [facility] at [phone] with questions or concerns regarding your invoice.   IF you received labwork today, you will receive an invoice from [facility]. Please contact [facility] at [phone] with questions or concerns regarding your invoice.   Our billing staff will not be able to assist you with questions regarding bills from these companies.  You will be contacted with the  lab results as soon as they are available. The fastest way to get your results is to activate your My Chart account. Instructions are located on the last page of this paperwork. If you have not heard from us regarding the results in 2 weeks, please contact this office.     

## 2017-10-10 ENCOUNTER — Encounter: Payer: Self-pay | Admitting: Physician Assistant

## 2017-10-10 DIAGNOSIS — R609 Edema, unspecified: Secondary | ICD-10-CM | POA: Diagnosis not present

## 2017-10-10 LAB — CBC WITH DIFFERENTIAL/PLATELET
BASOS ABS: 0 10*3/uL (ref 0.0–0.2)
Basos: 1 %
EOS (ABSOLUTE): 0 10*3/uL (ref 0.0–0.4)
EOS: 1 %
HEMOGLOBIN: 12.5 g/dL (ref 11.1–15.9)
Hematocrit: 37.3 % (ref 34.0–46.6)
IMMATURE GRANS (ABS): 0 10*3/uL (ref 0.0–0.1)
IMMATURE GRANULOCYTES: 1 %
LYMPHS: 16 %
Lymphocytes Absolute: 0.5 10*3/uL — ABNORMAL LOW (ref 0.7–3.1)
MCH: 28.2 pg (ref 26.6–33.0)
MCHC: 33.5 g/dL (ref 31.5–35.7)
MCV: 84 fL (ref 79–97)
MONOCYTES: 10 %
Monocytes Absolute: 0.3 10*3/uL (ref 0.1–0.9)
NEUTROS PCT: 71 %
Neutrophils Absolute: 2.3 10*3/uL (ref 1.4–7.0)
Platelets: 238 10*3/uL (ref 150–450)
RBC: 4.43 x10E6/uL (ref 3.77–5.28)
RDW: 14.2 % (ref 12.3–15.4)
WBC: 3.3 10*3/uL — AB (ref 3.4–10.8)

## 2017-10-10 LAB — IRON,TIBC AND FERRITIN PANEL
FERRITIN: 29 ng/mL (ref 15–150)
IRON SATURATION: 16 % (ref 15–55)
Iron: 64 ug/dL (ref 27–139)
TIBC: 391 ug/dL (ref 250–450)
UIBC: 327 ug/dL (ref 118–369)

## 2017-10-10 NOTE — Telephone Encounter (Signed)
Received fax from [facility] re: Tecfidera caps 240 mg scheduled to be delivered on 10/14/17.

## 2017-10-11 ENCOUNTER — Encounter: Payer: Self-pay | Admitting: Physician Assistant

## 2017-10-14 LAB — PATHOLOGIST SMEAR REVIEW
Basophils Absolute: 0 10*3/uL (ref 0.0–0.2)
Basos: 1 %
EOS (ABSOLUTE): 0.1 10*3/uL (ref 0.0–0.4)
EOS: 1 %
HEMOGLOBIN: 12.6 g/dL (ref 11.1–15.9)
Hematocrit: 38.8 % (ref 34.0–46.6)
Immature Grans (Abs): 0 10*3/uL (ref 0.0–0.1)
Immature Granulocytes: 0 %
LYMPHS ABS: 0.6 10*3/uL — AB (ref 0.7–3.1)
Lymphs: 17 %
MCH: 28.5 pg (ref 26.6–33.0)
MCHC: 32.5 g/dL (ref 31.5–35.7)
MCV: 88 fL (ref 79–97)
MONOCYTES: 9 %
Monocytes Absolute: 0.3 10*3/uL (ref 0.1–0.9)
NEUTROS ABS: 2.5 10*3/uL (ref 1.4–7.0)
Neutrophils: 72 %
PATH REV PLTS: NORMAL
PATH REV RBC: NORMAL
PATH REV WBC: NORMAL
Platelets: 253 10*3/uL (ref 150–450)
RBC: 4.42 x10E6/uL (ref 3.77–5.28)
RDW: 14.2 % (ref 12.3–15.4)
WBC: 3.5 10*3/uL (ref 3.4–10.8)

## 2017-10-20 ENCOUNTER — Telehealth: Payer: 59 | Admitting: Family Medicine

## 2017-10-20 DIAGNOSIS — N390 Urinary tract infection, site not specified: Secondary | ICD-10-CM

## 2017-10-20 MED ORDER — FLUCONAZOLE 150 MG PO TABS: 150.0000 mg | ORAL_TABLET | Freq: Once | ORAL | 0 refills | Status: AC

## 2017-10-20 MED ORDER — CIPROFLOXACIN HCL 500 MG PO TABS
500.0000 mg | ORAL_TABLET | Freq: Two times a day (BID) | ORAL | 0 refills | Status: AC
Start: 2017-10-20 — End: 2017-10-25

## 2017-10-20 MED FILL — FLUCONAZOLE 150 MG TABS: 150 | 1 days supply | Qty: 1 | Fill #0

## 2017-10-20 MED FILL — CIPROFLOXACIN HCL 500 MG TA: 500 | 5 days supply | Qty: 10 | Fill #0

## 2017-10-20 NOTE — Progress Notes (Signed)
We are sorry that you are not feeling well.  Here is how we plan to help!  If your symptoms are not improved in 48 hours please seek face to face care.  Based on what you shared with me it looks like you most likely have a simple urinary tract infection.  A UTI (Urinary Tract Infection) is a bacterial infection of the bladder.  Most cases of urinary tract infections are simple to treat but a key part of your care is to encourage you to drink plenty of fluids and watch your symptoms carefully.  I have prescribed Ciprofloxacin 500 mg twice a day for 5 days.  Your symptoms should gradually improve. Call us if the burning in your urine worsens, you develop worsening fever, back pain or pelvic pain or if your symptoms do not resolve after completing the antibiotic.  Urinary tract infections can be prevented by drinking plenty of water to keep your body hydrated.  Also be sure when you wipe, wipe from front to back and don't hold it in!  If possible, empty your bladder every 4 hours.  Your e-visit answers were reviewed by a board certified advanced clinical practitioner to complete your personal care plan.  Depending on the condition, your plan could have included both over the counter or prescription medications.  If there is a problem please reply  once you have received a response from your provider.  Your safety is important to [place].  If you have drug allergies check your prescription carefully.    You can use MyChart to ask questions about today's visit, request a non-urgent call back, or ask for a work or school excuse for 24 hours related to this e-Visit. If it has been greater than 24 hours you will need to follow up with your provider, or enter a new e-Visit to address those concerns.   You will get an e-mail in the next two days asking about your experience.  I hope that your e-visit has been valuable and will speed your recovery. Thank you for using e-visits.  We are sorry that you are  not feeling well. Here is how we plan to help! Based on what you shared with me it looks like you: May have a yeast vaginosis  Vaginosis is an inflammation of the vagina that can result in discharge, itching and pain. The cause is usually a change in the normal balance of vaginal bacteria or an infection. Vaginosis can also result from reduced estrogen levels after menopause.  The most common causes of vaginosis are:   Bacterial vaginosis which results from an overgrowth of one on several organisms that are normally present in your vagina.   Yeast infections which are caused by a naturally occurring fungus called candida.   Vaginal atrophy (atrophic vaginosis) which results from the thinning of the vagina from reduced estrogen levels after menopause.   Trichomoniasis which is caused by a parasite and is commonly transmitted by sexual intercourse.  Factors that increase your risk of developing vaginosis include: Marland Kitchen Medications, such as antibiotics and steroids . Uncontrolled diabetes . Use of hygiene products such as bubble bath, vaginal spray or vaginal deodorant . Douching . Wearing damp or tight-fitting clothing . Using an intrauterine device (IUD) for birth control . Hormonal changes, such as those associated with pregnancy, birth control pills or menopause . Sexual activity . Having a sexually transmitted infection  Your treatment plan is A single Diflucan (fluconazole) 150mg  tablet once.  I have electronically sent  this prescription into the pharmacy that you have chosen.  HOME CARE:  Good hygiene may prevent some types of vaginosis from recurring and may relieve some symptoms:  . Avoid baths, hot tubs and whirlpool spas. Rinse soap from your outer genital area after a shower, and dry the area well to prevent irritation. Don't use scented or harsh soaps, such as those with deodorant or antibacterial action. Marland Kitchen Avoid irritants. These include scented tampons and pads. . Wipe from  front to back after using the toilet. Doing so avoids spreading fecal bacteria to your vagina.  Other things that may help prevent vaginosis include:  Marland Kitchen Don't douche. Your vagina doesn't require cleansing other than normal bathing. Repetitive douching disrupts the normal organisms that reside in the vagina and can actually increase your risk of vaginal infection. Douching won't clear up a vaginal infection. . Use a latex condom. Both female and female latex condoms may help you avoid infections spread by sexual contact. . Wear cotton underwear. Also wear pantyhose with a cotton crotch. If you feel comfortable without it, skip wearing underwear to bed. Yeast thrives in [employer] Your symptoms should improve in the next day or two.  GET HELP RIGHT AWAY IF:  . You have pain in your lower abdomen ( pelvic area or over your ovaries) . You develop nausea or vomiting . You develop a fever . Your discharge changes or worsens . You have persistent pain with intercourse . You develop shortness of breath, a rapid pulse, or you faint.  These symptoms could be signs of problems or infections that need to be evaluated by a medical provider now.  MAKE SURE YOU    Understand these instructions.  Will watch your condition.  Will get help right away if you are not doing well or get worse.  Your e-visit answers were reviewed by a board certified advanced clinical practitioner to complete your personal care plan. Depending upon the condition, your plan could have included both over the counter or prescription medications. Please review your pharmacy choice to make sure that you have choses a pharmacy that is open for you to pick up any needed prescription, Your safety is important to [place]. If you have drug allergies check your prescription carefully.   You can use MyChart to ask questions about today's visit, request a non-urgent call back, or ask for a work or school excuse for 24 hours related to  this e-Visit. If it has been greater than 24 hours you will need to follow up with your provider, or enter a new e-Visit to address those concerns. You will get a MyChart message within the next two days asking about your experience. I hope that your e-visit has been valuable and will speed your recovery.

## 2017-11-12 ENCOUNTER — Encounter: Payer: Self-pay | Admitting: Diagnostic Neuroimaging

## 2017-11-13 NOTE — Progress Notes (Signed)
Office Visit Note  Patient: Katie Maldonado             Date of Birth: 12-08-1950           MRN: [id number]             PCP: Leonie Douglas, PA-C Referring: Leonie Douglas, PA* Visit Date: 11/26/2017 Occupation: @GUAROCC @  Subjective:  Pain in both hands   History of Present Illness: Katie Maldonado is a 67 y.o. female with history of osteoarthritis, DDD, and Osteoporosis.  She takes Fosamax 10 mg by mouth daily for management of osteoporosis.  She is taking calcium and vitamin D.  Patient reports she continues to have pain and swelling in bilateral hands.  She states she wakes up several times per night and her hands are stiff and swollen.  She continues to have symptoms of chronic carpal tunnel bilaterally.  She has had cortisone injections in the past, which provided temporary relief. She reports pain in bilateral knee joints but denies any joint swelling. She continues to have right trochanteric bursitis.  She reports over the past 3 weeks she has been having chest tightness and a dry cough.  She denies any palpitations.  She denies a history of MI.  She has not followed up with PCP for evaluation yet.    Activities of Daily Living:  Patient reports morning stiffness for 1 hour.   Patient Denies nocturnal pain.  Difficulty dressing/grooming: Denies Difficulty climbing stairs: Denies Difficulty getting out of chair: Reports Difficulty using hands for taps, buttons, cutlery, and/or writing: Denies  Review of Systems  Constitutional: Positive for fatigue.  HENT: Negative for mouth sores, mouth dryness and nose dryness.   Eyes: Negative for pain, visual disturbance and dryness.  Respiratory: Negative for cough, hemoptysis, shortness of breath and difficulty breathing.   Cardiovascular: Positive for swelling in legs/feet. Negative for chest pain, palpitations and hypertension.  Gastrointestinal: Positive for constipation. Negative for blood in stool and diarrhea.   Genitourinary: Negative for painful urination.  Musculoskeletal: Positive for arthralgias, joint pain, joint swelling, morning stiffness and muscle tenderness. Negative for myalgias, muscle weakness and myalgias.  Skin: Negative for color change, pallor, rash, hair loss, nodules/bumps, skin tightness, ulcers and sensitivity to sunlight.  Allergic/Immunologic: Negative for susceptible to infections.  Neurological: Negative for headaches.  Hematological: Negative for swollen glands.  Psychiatric/Behavioral: Negative for depressed mood and sleep disturbance. The patient is not nervous/anxious.     PMFS History:  Patient Active Problem List   Diagnosis Date Noted  . Osteoarthritis of lumbar spine 10/22/2016  . Primary osteoarthritis of both feet 03/09/2016  . Primary osteoarthritis of both knees 03/09/2016  . Degenerative cervical disc 10/04/2014  . Primary osteoarthritis of both hands 08/19/2014  . Osteoporosis 11/13/2012  . Incontinence of urine 04/18/2011  . HYPERLIPIDEMIA-MIXED 01/08/2010  . MULTIPLE SCLEROSIS 01/08/2010    Past Medical History:  Diagnosis Date  . Allergy   . Anxiety   . Arthritis   . Diverticulitis   . Diverticulosis 10/2015  . Hypersomnolence   . Migraine   . Multiple sclerosis ([place])   . Neuromuscular disorder ([place])   . Obesity, unspecified   . Osteoporosis   . Other and unspecified hyperlipidemia   . TIA (transient ischemic attack) 22    Family History  Problem Relation Age of Onset  . Emphysema Father   . Cancer Brother        bone  . Cancer Maternal Grandmother   . Heart  attack Brother   . Cancer Maternal Grandfather   . Cancer Paternal Grandmother   . Cancer Paternal Grandfather   . Irritable bowel syndrome Son    Past Surgical History:  Procedure Laterality Date  . ABDOMINAL HYSTERECTOMY     Fibroids  . arm surgery left    . FRACTURE SURGERY    . TUBAL LIGATION    . WRIST SURGERY     Social History   Social History Narrative    Patient is married [name]), has 2 children   Patient is right handed   Education level is 2 yrs of college   Caffeine consumption is 3-4 cups daily    Objective: Vital Signs: BP 114/72 (BP Location: Left Arm, Patient Position: Sitting, Cuff Size: Normal)   Pulse 87   Resp 16   Ht 5' 4.5" (1.638 m)   Wt 180 lb (81.6 kg)   BMI 30.42 kg/m    Physical Exam  Constitutional: She is oriented to person, place, and time. She appears well-developed and well-nourished.  HENT:  Head: Normocephalic and atraumatic.  Eyes: Conjunctivae and EOM are normal.  Neck: Normal range of motion.  Cardiovascular: Normal rate, regular rhythm, normal heart sounds and intact distal pulses.  Pulmonary/Chest: Effort normal and breath sounds normal.  Abdominal: Soft. Bowel sounds are normal.  Lymphadenopathy:    She has no cervical adenopathy.  Neurological: She is alert and oriented to person, place, and time.  Skin: Skin is warm and dry. Capillary refill takes less than 2 seconds.  Psychiatric: She has a normal mood and affect. Her behavior is normal.  Nursing note and vitals reviewed.    Musculoskeletal Exam: C-spine, thoracic spine, and lumbar spine good ROM. Midline spinal tenderness in the lumbar region.  No SI joint tenderness.  Shoulder joints, elbow joints, wrist joints, MCPs, PIPs, and DIPs good ROM with no synovitis.  She has complete fist formation bilaterally.  She has tenderness of MCP joints but no synovitis.  DIP synovial thickening.  Hip joints, knee joints, ankle joints, MTPs, PIPs,and DIPs good ROM with no synovitis.  No warmth or effusion of knee joints. Tenderness of right trochanteric bursa.   CDAI Exam: No CDAI exam completed.   Investigation: No additional findings.  Imaging: No results found.  Recent Labs: Lab Results  Component Value Date   WBC 3.5 10/10/2017   HGB 12.6 10/10/2017   PLT 253 10/10/2017   NA 139 09/30/2017   K 4.3 09/30/2017   CL 100 09/30/2017   CO2 25  09/30/2017   GLUCOSE 97 09/30/2017   BUN 13 09/30/2017   CREATININE 0.72 09/30/2017   BILITOT 0.4 09/30/2017   ALKPHOS 79 09/30/2017   AST 21 09/30/2017   ALT 18 09/30/2017   PROT 6.7 09/30/2017   ALBUMIN 4.3 09/30/2017   CALCIUM 9.4 09/30/2017   GFRAA 102 09/30/2017    Speciality Comments: No specialty comments available.  Procedures:  Large Joint Inj: R greater trochanter on 11/26/2017 4:19 PM Indications: pain Details: 27 G 1.5 in needle, lateral approach  Arthrogram: No  Medications: 1 mL lidocaine 1 %; 40 mg triamcinolone acetonide 40 MG/ML Aspirate: 0 mL Outcome: tolerated well, no immediate complications Procedure, treatment alternatives, risks and benefits explained, specific risks discussed. Consent was given by the patient. Immediately prior to procedure a time out was called to verify the correct patient, procedure, equipment, support staff and site/side marked as required. Patient was prepped and draped in the usual sterile fashion.  Allergies: Nitrofurantoin; Sulfonamide derivatives; Betaseron [interferon beta-1b]; Cefdinir; and Copaxone [glatiramer acetate]   Assessment / Plan:     Visit Diagnoses: Primary osteoarthritis of both hands: She has PIP and DIP synovial thickening consistent with osteoarthritis.  She has no synovitis on exam.  She has been having increased pain and swelling in bilateral hands.  She wakes up several times a night with joint swelling.  XR of bilateral hands were obtained at her visit in 06/26/17 that revealed findings consistent with osteoarthritis.  No erosive changes were noted.  Lab work was also obtained at that visit that revealed RF-, CCP-, uric acid WNL, and 14-3-3 eta negative. Due to her increased pain and subjective joint swelling, an ultrasound of bilateral hands was obtained today.  The ultrasound did not reveal synovitis or erosive changes.  Findings were consistent with osteoarthritis. A refill for voltaren gel was sent to the  pharmacy.  She has symptoms of carpal tunnel syndrome.  Left median nerve measurements on ultraosound were at the upper limits of normal. She was given a prescription for bilateral carpal tunnel night splints.   Primary osteoarthritis of both knees: No warmth or effusion noted.  Good ROM on exam.  She has occasional discomfort in bilateral knee joints.   Primary osteoarthritis of both feet: She has osteoarthritic changes.  She has pedal edema bilaterally.    DDD (degenerative disc disease), cervical: She has good ROM on exam.  She has chronic neck pain.  No symptoms of radiculopathy.   Trochanteric bursitis, right hip: She has tenderness of the right trochanteric bursa.  She requested a cortisone injection today in the office.  She tolerated the procedure well.    Age-related osteoporosis without current pathological fracture -She takes Fosamax 10 mg daily.  She had a DEXA on 04/23/17. Her T-score is -3.4. She takes calcium and vitamin D.   History of fatigue: Chronic.   History of vitamin D deficiency: She is on a vitamin D supplement.   History of anxiety  History of multiple sclerosis - She is followed by her neurologist every 3-6 months.    Orders: Orders Placed This Encounter  Procedures  . Large Joint Inj   Meds ordered this encounter  Medications  . diclofenac sodium (VOLTAREN) 1 % GEL    Sig: Apply 3 grams to 3 large joints up to 3 times daily    Dispense:  3 Tube    Refill:  3    Face-to-face time spent with patient was 30 minutes. Greater than 50% of time was spent in counseling and coordination of care.  Follow-Up Instructions: Return in about 6 months (around 05/29/2018) for Osteoarthritis, Osteoporosis, DDD.   Ofilia Neas, PA-C   I examined and evaluated the patient with Hazel Sams PA.  Patient continues to have pain and discomfort in her bilateral PIP and DIP joints.  No visible synovitis was noted.  The ultrasound examination today did not reveal any  synovitis.  The findings were consistent with osteoarthritis.  The plan of care was discussed as noted above.  Bo Merino, MD  Note - This record has been created using Editor, commissioning.  Chart creation errors have been sought, but may not always  have been located. Such creation errors do not reflect on  the standard of medical care.

## 2017-11-14 DIAGNOSIS — N3941 Urge incontinence: Secondary | ICD-10-CM | POA: Diagnosis not present

## 2017-11-14 DIAGNOSIS — R3915 Urgency of urination: Secondary | ICD-10-CM | POA: Diagnosis not present

## 2017-11-14 DIAGNOSIS — N3281 Overactive bladder: Secondary | ICD-10-CM | POA: Diagnosis not present

## 2017-11-14 MED FILL — OMEPRAZOLE 20 MG CPDR: 20 | 90 days supply | Qty: 90 | Fill #3

## 2017-11-24 ENCOUNTER — Other Ambulatory Visit: Payer: Self-pay | Admitting: Diagnostic Neuroimaging

## 2017-11-24 MED FILL — CITALOPRAM HBR 20 MG TABLET: 20 | 90 days supply | Qty: 90 | Fill #3

## 2017-11-24 MED FILL — ADDERALL XR 20 MG CAP SA: 20 | 30 days supply | Qty: 30 | Fill #0

## 2017-11-24 NOTE — Telephone Encounter (Signed)
Drug Registry Adderall 20mg  last filled 10-03-17 #30.

## 2017-11-25 DIAGNOSIS — N3281 Overactive bladder: Secondary | ICD-10-CM | POA: Diagnosis not present

## 2017-11-25 DIAGNOSIS — N3941 Urge incontinence: Secondary | ICD-10-CM | POA: Diagnosis not present

## 2017-11-25 DIAGNOSIS — R3915 Urgency of urination: Secondary | ICD-10-CM | POA: Diagnosis not present

## 2017-11-26 ENCOUNTER — Encounter: Payer: Self-pay | Admitting: Rheumatology

## 2017-11-26 ENCOUNTER — Ambulatory Visit (INDEPENDENT_AMBULATORY_CARE_PROVIDER_SITE_OTHER): Payer: Self-pay

## 2017-11-26 ENCOUNTER — Ambulatory Visit: Payer: 59 | Admitting: Rheumatology

## 2017-11-26 VITALS — BP 114/72 | HR 87 | Resp 16 | Ht 64.5 in | Wt 180.0 lb

## 2017-11-26 DIAGNOSIS — M170 Bilateral primary osteoarthritis of knee: Secondary | ICD-10-CM

## 2017-11-26 DIAGNOSIS — M19041 Primary osteoarthritis, right hand: Secondary | ICD-10-CM

## 2017-11-26 DIAGNOSIS — M19042 Primary osteoarthritis, left hand: Secondary | ICD-10-CM

## 2017-11-26 DIAGNOSIS — Z87898 Personal history of other specified conditions: Secondary | ICD-10-CM | POA: Diagnosis not present

## 2017-11-26 DIAGNOSIS — Z8639 Personal history of other endocrine, nutritional and metabolic disease: Secondary | ICD-10-CM

## 2017-11-26 DIAGNOSIS — M5030 Other cervical disc degeneration, unspecified cervical region: Secondary | ICD-10-CM

## 2017-11-26 DIAGNOSIS — M7061 Trochanteric bursitis, right hip: Secondary | ICD-10-CM

## 2017-11-26 DIAGNOSIS — M79642 Pain in left hand: Secondary | ICD-10-CM

## 2017-11-26 DIAGNOSIS — M19071 Primary osteoarthritis, right ankle and foot: Secondary | ICD-10-CM

## 2017-11-26 DIAGNOSIS — M810 Age-related osteoporosis without current pathological fracture: Secondary | ICD-10-CM

## 2017-11-26 DIAGNOSIS — M19072 Primary osteoarthritis, left ankle and foot: Secondary | ICD-10-CM

## 2017-11-26 DIAGNOSIS — Z8659 Personal history of other mental and behavioral disorders: Secondary | ICD-10-CM

## 2017-11-26 DIAGNOSIS — Z8669 Personal history of other diseases of the nervous system and sense organs: Secondary | ICD-10-CM

## 2017-11-26 DIAGNOSIS — M79641 Pain in right hand: Secondary | ICD-10-CM

## 2017-11-26 DIAGNOSIS — G35 Multiple sclerosis: Secondary | ICD-10-CM

## 2017-11-26 DIAGNOSIS — G5603 Carpal tunnel syndrome, bilateral upper limbs: Secondary | ICD-10-CM | POA: Diagnosis not present

## 2017-11-26 MED ORDER — LIDOCAINE HCL 1 % IJ SOLN
1.0000 mL | INTRAMUSCULAR | Status: AC | PRN
  Administered 2017-11-26: 1 mL

## 2017-11-26 MED ORDER — DICLOFENAC SODIUM 1 % TD GEL: TRANSDERMAL | 3 refills | Status: DC

## 2017-11-26 MED ORDER — TRIAMCINOLONE ACETONIDE 40 MG/ML IJ SUSP
40.0000 mg | INTRAMUSCULAR | Status: AC | PRN
  Administered 2017-11-26: 40 mg via INTRA_ARTICULAR

## 2017-12-23 ENCOUNTER — Encounter: Payer: Self-pay | Admitting: Diagnostic Neuroimaging

## 2017-12-23 ENCOUNTER — Ambulatory Visit: Payer: 59 | Admitting: Diagnostic Neuroimaging

## 2017-12-23 DIAGNOSIS — G35 Multiple sclerosis: Secondary | ICD-10-CM | POA: Diagnosis not present

## 2017-12-23 NOTE — Progress Notes (Signed)
PATIENT: Katie Maldonado DOB: 1950-11-09   REASON FOR VISIT: follow up for MS HISTORY FROM: patient  Chief Complaint  Patient presents with  . Follow-up    6 month f/u room 7 alone  . Multiple Sclerosis    Currently taking tecfidera, reports things are going well no new issues.     HISTORY OF PRESENT ILLNESS:  UPDATE (12/23/17, VRP): Since last visit, doing well. Symptoms are stable, except for new onset of vertigo symptoms a few weeks ago. Severity is mild. No alleviating or aggravating factors. Tolerating meds.    UPDATE (06/18/17, VRP): Since last visit, doing about the same. Tolerating meds. No alleviating or aggravating factors.   UPDATE 12/16/16: Since last visit, doing about the same. Still with fatigue. Tolerating tecfidera. Denies depression. Sleep is stable.   UPDATE 06/18/16: Since last visit, doing better. Better energy and concentration. PT helping with back pain.   UPDATE 03/06/16: Since last visit, continues with fatigue, poor attention, difficulty at work. She is in corrective action due to errors. Denies depression, but has excessive daytime fatigue.   UPDATE 08/31/15: Since last visit continues with fatigue, pain, numbness. Tolerating tecfidera. Has tried PT. Urinary issues better. Now recovering from right arm celluitis (was painting, developed blister, then got infected; now better on oral antibiotics).  UPDATE 07/07/15: Since last visit, still with left neck pain radiating to the left arm. Numb/tingling. Has h/o left forearm/wrist fracture (2005 and 2010; s/p surgery). Amantadine not helping with fatigue. Denies depression.   UPDATE 05/31/15: Since last visit, sxs progressing since Oct 2016. More fatigue, more bladder/bowel issues. More left arm numbness.  UPDATE 11/28/14: Since last visit, continues to have prickly, tingling sensation in whole body, hot flash sensations, pain, neck issues. Had 2 falls, but no injuries.  UPDATE 08/23/14: Since last visit, has  been on tecfidera since beginning March 2016. She has noted more general numbness, fatigue, constipation, drawstring sensation in calves/hamstrings in past 3 weeks.   UPDATE 05/18/14: MS symptoms stable. Has had 2 reactions to copaxone, leading to tinging, prickly sensations over whole body. 1 event led to throat tightness, tongue feeling thick, and worried patient. Here to discuss possibilities of switching therapies.  UPDATE 01/04/14 (VRP): Since last visit, doing the same. No new neuro symptoms. Does report persistent fatigue, weight gain, incr appetite, feeling hot. Tolerating copaxone. Misses 2-3 injections per month.   UPDATE 06/30/13 (LL):  Since last visit patient continues to have problems with fatigue and malaise, numbness and paresthesias in left arm which is new for her, started a couple weeks ago.  She states it is constant, does not hurt, but annoying.  She is still able to work.  Follow up MRI brain at last visit showed no acute or new lesions, with moderate cerebral atrophy.  UPDATE 12/30/12 (VP): Since last visit patient continues to have problems with fatigue, insomnia, anxiety, shortness of breath. For past 6 months she's been having dyspnea on exertion especially climbing steps at her work. She has been evaluated by PCP without specific diagnosis. Patient continues on Copaxone (now 3 times per week dosing). Last MRI brain was in 2011. Regarding fatigue she has been on Provigil and Ritalin in the past without relief. Regarding anxiety she was on Paxil in the past but this caused nausea and stomach problems.   PRIOR HPI (05/02/12, Dr. 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 Her  MRI of brain 06/10/1999 showed white matter abnormalities not typical for MS and MRI of the cervical spine without contrast 07/27/99 showed a focal area of abnormality in the spinal cord at C2-3. She had  positive CSF IgG index and oligoclonal banding. Initially the VER was normal with subsequent VERs 12/04/1999, 12/10/1999, and 02/01/2000 abnormal in the right eye. She began Avonex which she took for one year and switched to Rebif beginning 08/2000. She was then switched to Betaseron 04/2001 because her insurance would not pay for Rebif, but was subsequently switched back to Rebif 04/2003. Because of elevated liver function tests she was switched to Copaxone 07/03/2005.She has felt better on Copaxone then on the interferons. Her visual acuity varies from 20/30 to 20/40 in the left eye and to 20/200 in the right. She had burning paresthesias treated with gabapentin and at times amitriptyline. NMO blood test was negative 02/21/2005. She has been followed Dr. Estanislado Pandy, rheumatologist for the question of RA versus sarcoidosis and by Dr.Clinton Young for hypersomnolence without a diagnosis of narcolepsy or obstructive Sleep apnea. MRI studies of the brain and cervical spine 02/08/2010 showed periventricular lesions and unchanged lesion at C2-3. CBC and CMP 03/17/2009 were normal. CBC, CMP, TSH, lipid profile except LDL 151 were normal 08/07/09.vitamin D level 10/16/2009 was 41. She complains of fatigue and her right foot turning in an occasional foot cramping. She fell 10/2009 fracturing her right foot in a [employer] parking lot. In 2010 she fractured her left foot, left wrist, and her left arm. She has continued swelling in her right foot and lower leg. She has an overactive bladder followed by Dr.Dalhstedt. She has a history of Lhermitte's sign worse with neck extension but also present with flexion. She had numbness on the right side of her body except her face and head for one month.Her bladder symptoms improved on enablex.She walks 1.8 miles 3 times per week. 03/05/2011 she was lying in bed asleep and awoke trying to sit up and developed dizziness on 2 different occasions. She fell back in to the bed and went to sleep. On the  third episode she became concerned. The episodes of dizziness would last seconds and are described as spinning. She had dizziness during the day without spinning She did not have head trauma or a cold. She was seen by Dr. Elder Cyphers at urgent care and underwent blood studies, EKG, and urinalysis. She has a history of migraine and a history of motion sickness. She has a Lhermitte's sign when she turns her head to the left with discomfort in her neck going into her left arm. This can occur 2 times per day and lasts Less than 1 minute. She has bladder symptoms and right flank pain and is concerned about another urinary tract infection. She can awaken with snoring when she has a cold. She sleeps well. She has dizziness 2 or 3 times per week when she lies down and turns her head to the left.    REVIEW OF SYSTEMS: Full 14 system review of systems performed and negative except: as per HPI.   ALLERGIES: Allergies  Allergen Reactions  . Nitrofurantoin Shortness Of Breath    Shortness of breath, violent chills, dizziness, nausea  . Sulfonamide Derivatives Shortness Of Breath    Shortness of breath, chills  . Betaseron [Interferon Beta-1b]     Shortness of breath, rash, heart palpatations  . Cefdinir     Can't remember what rx had 2 yrs ago  . Copaxone [Glatiramer Acetate]  HOME MEDICATIONS: Outpatient Medications Prior to Visit  Medication Sig Dispense Refill  . ADDERALL XR 20 MG 24 hr capsule TAKE 1 CAPSULE BY MOUTH ONCE DAILY 30 capsule 0  . alendronate (FOSAMAX) 10 MG tablet Take 1 tablet (10 mg total) by mouth daily before breakfast. Take with a full glass of water on an empty stomach. Avoid lying down x 30 min. 90 tablet 3  . calcium acetate (PHOSLO) 667 MG capsule Take by mouth 3 (three) times daily with meals.    . Cholecalciferol (VITAMIN D3) 5000 units CAPS Take by mouth daily.    . citalopram (CELEXA) 20 MG tablet Take 1 tablet (20 mg total) by mouth daily. 90 tablet 3  . diclofenac sodium  (VOLTAREN) 1 % GEL Apply 3 grams to 3 large joints up to 3 times daily 3 Tube 3  . Dimethyl Fumarate (TECFIDERA) 240 MG CPDR Take 1 capsule (240 mg total) by mouth 2 (two) times daily. 180 capsule 1  . ibuprofen (ADVIL,MOTRIN) 200 MG tablet Take 2 tablets (400 mg total) by mouth 4 (four) times daily.    . Mirabegron (MYRBETRIQ PO) Take by mouth.    . Omega-3 Fatty Acids (FISH OIL) 1000 MG CAPS Take by mouth daily.    Marland Kitchen omeprazole (PRILOSEC) 20 MG capsule Take 1 capsule (20 mg total) by mouth daily. 90 capsule 3  . ondansetron (ZOFRAN-ODT) 4 MG disintegrating tablet DISSOLVE 1 TABLET BY MOUTH EVERY 8 HOURS AS NEEDED FOR NAUSEA AND VOMITING 60 tablet 0   No facility-administered medications prior to visit.    PHYSICAL EXAM  GENERAL EXAM/CONSTITUTIONAL: Vitals:  Vitals:   12/23/17 1105  BP: 122/74  Pulse: 72  Weight: 164 lb (74.4 kg)  Height: 5' 4.5" (1.638 m)     Body mass index is 27.72 kg/m. Wt Readings from Last 3 Encounters:  12/23/17 164 lb (74.4 kg)  11/26/17 180 lb (81.6 kg)  10/09/17 164 lb 3.2 oz (74.5 kg)     Patient is in no distress; well developed, nourished and groomed; neck is supple  CARDIOVASCULAR:  Examination of carotid arteries is normal; no carotid bruits  Regular rate and rhythm, no murmurs  Examination of peripheral vascular system by observation and palpation is normal  EYES:  Ophthalmoscopic exam of optic discs and posterior segments is normal; no papilledema or hemorrhages  Visual Acuity Screening   Right eye Left eye Both eyes  Without correction:     With correction: 20/40 20/40      MUSCULOSKELETAL:  Gait, strength, tone, movements noted in Neurologic exam below  NEUROLOGIC: MENTAL STATUS:  No flowsheet data found.  awake, alert, oriented to person, place and time  recent and remote memory intact  normal attention and concentration  language fluent, comprehension intact, naming intact  fund of knowledge  appropriate  CRANIAL NERVE:   2nd - no papilledema on fundoscopic exam  2nd, 3rd, 4th, 6th - pupils equal and reactive to light, visual fields full to confrontation, extraocular muscles intact, no nystagmus  5th - facial sensation symmetric  7th - facial strength symmetric  8th - hearing intact  9th - palate elevates symmetrically, uvula midline  11th - shoulder shrug symmetric  12th - tongue protrusion midline  MOTOR:   normal bulk and tone, full strength in the BUE, BLE  SENSORY:   normal and symmetric to light touch, temperature, vibration  COORDINATION:   finger-nose-finger, fine finger movements normal  REFLEXES:   deep tendon reflexes present and symmetric; BRISK AT  KNEES  GAIT/STATION:   narrow based gait; SLIGHT DIFF WITH TANDEM    DIAGNOSTIC DATA (LABS, IMAGING, TESTING) - I reviewed patient records, labs, notes, testing and imaging myself where available.  Lab Results  Component Value Date   WBC 3.5 10/10/2017   HGB 12.6 10/10/2017   HCT 38.8 10/10/2017   MCV 88 10/10/2017   PLT 253 10/10/2017   Lymphocytes Absolute  Date Value Ref Range Status  12/23/2017 0.7 0.7 - 3.1 x10E3/uL Final  10/10/2017 0.6 (L) 0.7 - 3.1 x10E3/uL Final  10/09/2017 0.5 (L) 0.7 - 3.1 x10E3/uL Final  06/05/2017 0.8 0.7 - 3.1 x10E3/uL Final  06/05/2016 0.7 0.7 - 3.1 x10E3/uL Final  12/05/2015 0.8 0.7 - 3.1 x10E3/uL Final  08/31/2015 0.6 (L) 0.7 - 3.1 x10E3/uL Final   Lymphs Abs  Date Value Ref Range Status  03/12/2016 559 (L) 850 - 3,900 cells/uL Final   CMP Latest Ref Rng & Units 09/30/2017 06/05/2017 07/29/2016  Glucose 65 - 99 mg/dL 97 90 93  BUN 8 - 27 mg/dL 13 15 17   Creatinine 0.57 - 1.00 mg/dL 0.72 0.83 0.77  Sodium 134 - 144 mmol/L 139 139 137  Potassium 3.5 - 5.2 mmol/L 4.3 4.5 4.1  Chloride 96 - 106 mmol/L 100 100 98  CO2 20 - 29 mmol/L 25 26 22   Calcium 8.7 - 10.3 mg/dL 9.4 9.5 8.3(L)  Total Protein 6.0 - 8.5 g/dL 6.7 6.7 6.5  Total Bilirubin 0.0 - 1.2  mg/dL 0.4 0.7 0.9  Alkaline Phos 39 - 117 IU/L 79 91 91  AST 0 - 40 IU/L 21 20 36  ALT 0 - 32 IU/L 18 18 29    Lab Results  Component Value Date   TSH 3.780 09/30/2017    08/31/14 MRI brain - showing T2/flair hyperintense foci in the periventricular, deep and subcortical matter in a pattern and configuration consistent with the diagnosis of multiple sclerosis. No acute foci are noted. Generalized cortical atrophy is noted. When compared to a study dated 05/26/2014, there is no interval change.  08/31/14 MRI cervical spine -  1. Severe degenerative changes at C4-C5, C5-C6 and C6-C7 with there is disc protrusion, uncovertebral spurring and facet hypertrophy. There is mild spinal stenosis at each of these 3 levels. There is also moderately severe foraminal narrowing which could lead to impingement of the exiting C5, C6 and C7 nerve roots. 2. Degenerative changes are milder at C2-C3, C3-C4 and C7-T1 and are less likely to lead to nerve root impingement.  3. The spinal cord appears normal.  4. There are no enhancing abnormalities.  5. Compared to the MRI of the cervical spine dated 07/07/2013, there is no definite interval change.   06/14/15 MRI brain  1. Mild periventricular and subcortical and infratentorial chronic demyelinating plaques.  2. No abnormal lesions are seen on post contrast views.  3. No change from MRI on 08/31/14.   06/28/15 MRI cervical  - severe degenerative changes at C4-5, C5-6 and C6-7 resulting in broad-based disc protrusions, uncovertebral spurring and facet hypertrophy resulting in mild canal and moderate bilateral foraminal narrowing with likely encroachment on the exiting C5, C6 and C7 nerve roots. No enhancing or demyelinating lesions are noted. Overall no significant change compared with previous MRI scan dated 08/31/2014.  01/01/17 MRI brain [I reviewed images myself and agree with interpretation. -VRP]  1. Mild periventricular and subcortical and infratentorial  chronic demyelinating plaques.  2. No abnormal lesions are seen on post contrast views.  3. No change from  MRI on 06/14/15.  08/31/15 anti-JCV antibody - 1.71 high/positive    ASSESSMENT AND PLAN  67 y.o. female here with multiple sclerosis since 2001, on copaxone since 2007. Previously on avonex and betaseron.  Then apparent intolerance to copaxone with 2 events of tingling, throat tightness, thick tongue with 2 injections. Now on tecfidera since March 2016. Some progression of symptoms since Oct 2016, but MRI scans are stable.  Left neck and arm symptoms likely related to cervical radiculopathies. Will try conservative treatment options.  Tried gabapentin for numbness --> stopped due to sleepiness.  Tried ritalin, provigil, amantadine for fatigue --> stopped due to in-effectiveness.   Dx:  Multiple sclerosis ([place]) - Plan: CBC with Differential/Platelet, Comprehensive metabolic panel, MR BRAIN W WO CONTRAST    PLAN:  MULTIPLE SCLEROSIS - check CBC (monitor lymphocytes) - repeat MRI brain (new vertigo) - continue tecfidera - continue gentle yoga classes and PT exercises - continue adderall for fatigue and poor attention  Orders Placed This Encounter  Procedures  . MR BRAIN W WO CONTRAST  . CBC with Differential/Platelet  . Comprehensive metabolic panel   Return in about 8 months (around 08/24/2018).    Penni Bombard, MD 08/07/3011, 14:38 AM Certified in Neurology, Neurophysiology and Neuroimaging  [facility] [address] [place], [place] [address] [phone]

## 2017-12-24 ENCOUNTER — Telehealth: Payer: Self-pay | Admitting: Diagnostic Neuroimaging

## 2017-12-24 LAB — CBC WITH DIFFERENTIAL/PLATELET
BASOS: 1 %
Basophils Absolute: 0 10*3/uL (ref 0.0–0.2)
EOS (ABSOLUTE): 0.1 10*3/uL (ref 0.0–0.4)
Eos: 2 %
HEMATOCRIT: 41.2 % (ref 34.0–46.6)
Hemoglobin: 13 g/dL (ref 11.1–15.9)
IMMATURE GRANULOCYTES: 0 %
Immature Grans (Abs): 0 10*3/uL (ref 0.0–0.1)
LYMPHS ABS: 0.7 10*3/uL (ref 0.7–3.1)
Lymphs: 15 %
MCH: 27.2 pg (ref 26.6–33.0)
MCHC: 31.6 g/dL (ref 31.5–35.7)
MCV: 86 fL (ref 79–97)
MONOS ABS: 0.4 10*3/uL (ref 0.1–0.9)
Monocytes: 10 %
NEUTROS ABS: 3.3 10*3/uL (ref 1.4–7.0)
Neutrophils: 72 %
Platelets: 244 10*3/uL (ref 150–450)
RBC: 4.78 x10E6/uL (ref 3.77–5.28)
RDW: 13 % (ref 12.3–15.4)
WBC: 4.5 10*3/uL (ref 3.4–10.8)

## 2017-12-24 LAB — COMPREHENSIVE METABOLIC PANEL
A/G RATIO: 1.9 (ref 1.2–2.2)
ALT: 19 IU/L (ref 0–32)
AST: 20 IU/L (ref 0–40)
Albumin: 4.6 g/dL (ref 3.6–4.8)
Alkaline Phosphatase: 77 IU/L (ref 39–117)
BUN/Creatinine Ratio: 15 (ref 12–28)
BUN: 11 mg/dL (ref 8–27)
Bilirubin Total: 0.4 mg/dL (ref 0.0–1.2)
CALCIUM: 9.7 mg/dL (ref 8.7–10.3)
CO2: 26 mmol/L (ref 20–29)
Chloride: 99 mmol/L (ref 96–106)
Creatinine, Ser: 0.75 mg/dL (ref 0.57–1.00)
GFR calc Af Amer: 97 mL/min/{1.73_m2} (ref >59)
GFR, EST NON AFRICAN AMERICAN: 84 mL/min/{1.73_m2} (ref >59)
GLOBULIN, TOTAL: 2.4 g/dL (ref 1.5–4.5)
Glucose: 79 mg/dL (ref 65–99)
POTASSIUM: 4.5 mmol/L (ref 3.5–5.2)
SODIUM: 139 mmol/L (ref 134–144)
Total Protein: 7 g/dL (ref 6.0–8.5)

## 2017-12-24 NOTE — Telephone Encounter (Signed)
I spoke to the patient and she is scheduled for 12/30/17 at [facility].

## 2017-12-24 NOTE — Telephone Encounter (Signed)
I spoke to the patient and she is scheduled for 12/30/17 at [facility].

## 2017-12-24 NOTE — Telephone Encounter (Signed)
Pt has returned the call to Raquel Sarna, she is asking for a call back re: her MRI

## 2017-12-24 NOTE — Telephone Encounter (Signed)
lvm for pt to call back about scheduling mri  [facility] auth: NPR Ref # [place] Headings

## 2017-12-30 ENCOUNTER — Ambulatory Visit: Payer: 59

## 2017-12-30 ENCOUNTER — Other Ambulatory Visit: Payer: Self-pay | Admitting: Diagnostic Neuroimaging

## 2017-12-30 DIAGNOSIS — G35 Multiple sclerosis: Secondary | ICD-10-CM | POA: Diagnosis not present

## 2017-12-30 MED ORDER — GADOPENTETATE DIMEGLUMINE 469.01 MG/ML IV SOLN
15.0000 mL | Freq: Once | INTRAVENOUS | Status: AC | PRN
  Administered 2017-12-30: 15 mL via INTRAVENOUS

## 2017-12-30 NOTE — Telephone Encounter (Signed)
Pt requesting refill of Adderall. Best call back is [phone]

## 2017-12-31 ENCOUNTER — Telehealth: Payer: Self-pay | Admitting: *Deleted

## 2017-12-31 DIAGNOSIS — N3281 Overactive bladder: Secondary | ICD-10-CM | POA: Diagnosis not present

## 2017-12-31 DIAGNOSIS — N3941 Urge incontinence: Secondary | ICD-10-CM | POA: Diagnosis not present

## 2017-12-31 MED ORDER — AMPHETAMINE-DEXTROAMPHET ER 20 MG PO CP24: 20.0000 mg | ORAL_CAPSULE | Freq: Every day | ORAL | 0 refills | Status: DC

## 2017-12-31 MED FILL — ADDERALL XR 20 MG CAP SA: 20 | 30 days supply | Qty: 30 | Fill #0

## 2017-12-31 NOTE — Telephone Encounter (Signed)
Pt [employer] form on [facility] desk to be filled out.

## 2017-12-31 NOTE — Telephone Encounter (Signed)
Drug Registry check last fill 11-24-17 #30.

## 2018-01-01 ENCOUNTER — Other Ambulatory Visit: Payer: Self-pay

## 2018-01-01 ENCOUNTER — Encounter: Payer: Self-pay | Admitting: Physician Assistant

## 2018-01-01 ENCOUNTER — Ambulatory Visit (INDEPENDENT_AMBULATORY_CARE_PROVIDER_SITE_OTHER): Payer: 59

## 2018-01-01 ENCOUNTER — Ambulatory Visit: Payer: 59 | Admitting: Physician Assistant

## 2018-01-01 VITALS — BP 132/84 | HR 71 | Temp 98.4°F | Resp 18 | Ht 64.5 in | Wt 164.6 lb

## 2018-01-01 DIAGNOSIS — M545 Low back pain, unspecified: Secondary | ICD-10-CM

## 2018-01-01 DIAGNOSIS — M47816 Spondylosis without myelopathy or radiculopathy, lumbar region: Secondary | ICD-10-CM | POA: Diagnosis not present

## 2018-01-01 DIAGNOSIS — Z0289 Encounter for other administrative examinations: Secondary | ICD-10-CM

## 2018-01-01 MED ORDER — CYCLOBENZAPRINE HCL 5 MG PO TABS: 5.0000 mg | ORAL_TABLET | Freq: Three times a day (TID) | ORAL | 0 refills | Status: DC | PRN

## 2018-01-01 MED FILL — CYCLOBENZAPRINE HCL 5 MG TA: 5 | 10 days supply | Qty: 30 | Fill #0

## 2018-01-01 NOTE — Patient Instructions (Signed)
° ° ° °  If you have lab work done today you will be contacted with your lab results within the next 2 weeks.  If you have not heard from us then please contact us. The fastest way to get your results is to register for My Chart. ° ° °IF you received an x-ray today, you will receive an invoice from [facility]. Please contact [facility] at [phone] with questions or concerns regarding your invoice.  ° °IF you received labwork today, you will receive an invoice from [facility]. Please contact [facility] at [phone] with questions or concerns regarding your invoice.  ° °Our billing staff will not be able to assist you with questions regarding bills from these companies. ° °You will be contacted with the lab results as soon as they are available. The fastest way to get your results is to activate your My Chart account. Instructions are located on the last page of this paperwork. If you have not heard from us regarding the results in 2 weeks, please contact this office. °  ° ° ° °

## 2018-01-01 NOTE — Progress Notes (Signed)
Katie Maldonado  MRN: [id number] DOB: 03-21-1951  PCP: Leonie Douglas, PA-C  Chief Complaint  Patient presents with  . Back Pain    picked up bed and had a large pop noise come from back     Subjective:  Pt presents to clinic for low back pain that started last night when she picked up her bed and felt/heard a pop.  She feels some weakness in the back of the legs.  No pain down her legs.  The entire low back is where she has pain.  She has osteopenia but has never had a compression fracture that she knows of.  History is obtained by patient.  Review of Systems  Musculoskeletal: Positive for back pain. Negative for gait problem.    Patient Active Problem List   Diagnosis Date Noted  . Osteoarthritis of lumbar spine 10/22/2016  . Primary osteoarthritis of both feet 03/09/2016  . Primary osteoarthritis of both knees 03/09/2016  . Degenerative cervical disc 10/04/2014  . Primary osteoarthritis of both hands 08/19/2014  . Osteoporosis 11/13/2012  . Incontinence of urine 04/18/2011  . HYPERLIPIDEMIA-MIXED 01/08/2010  . MULTIPLE SCLEROSIS 01/08/2010    Current Outpatient Medications on File Prior to Visit  Medication Sig Dispense Refill  . alendronate (FOSAMAX) 10 MG tablet Take 1 tablet (10 mg total) by mouth daily before breakfast. Take with a full glass of water on an empty stomach. Avoid lying down x 30 min. 90 tablet 3  . amphetamine-dextroamphetamine (ADDERALL XR) 20 MG 24 hr capsule Take 1 capsule (20 mg total) by mouth daily. 30 capsule 0  . calcium acetate (PHOSLO) 667 MG capsule Take by mouth 3 (three) times daily with meals.    . Cholecalciferol (VITAMIN D3) 5000 units CAPS Take by mouth daily.    . citalopram (CELEXA) 20 MG tablet Take 1 tablet (20 mg total) by mouth daily. 90 tablet 3  . diclofenac sodium (VOLTAREN) 1 % GEL Apply 3 grams to 3 large joints up to 3 times daily 3 Tube 3  . Dimethyl Fumarate (TECFIDERA) 240 MG CPDR Take 1 capsule (240 mg  total) by mouth 2 (two) times daily. 180 capsule 1  . ibuprofen (ADVIL,MOTRIN) 200 MG tablet Take 2 tablets (400 mg total) by mouth 4 (four) times daily.    . Mirabegron (MYRBETRIQ PO) Take by mouth.    . Omega-3 Fatty Acids (FISH OIL) 1000 MG CAPS Take by mouth daily.    Marland Kitchen omeprazole (PRILOSEC) 20 MG capsule Take 1 capsule (20 mg total) by mouth daily. 90 capsule 3  . ondansetron (ZOFRAN-ODT) 4 MG disintegrating tablet DISSOLVE 1 TABLET BY MOUTH EVERY 8 HOURS AS NEEDED FOR NAUSEA AND VOMITING 60 tablet 0   No current facility-administered medications on file prior to visit.     Allergies  Allergen Reactions  . Nitrofurantoin Shortness Of Breath    Shortness of breath, violent chills, dizziness, nausea  . Sulfonamide Derivatives Shortness Of Breath    Shortness of breath, chills  . Betaseron [Interferon Beta-1b]     Shortness of breath, rash, heart palpatations  . Cefdinir     Can't remember what rx had 2 yrs ago  . Copaxone [Glatiramer Acetate]     Past Medical History:  Diagnosis Date  . Allergy   . Anxiety   . Arthritis   . Diverticulitis   . Diverticulosis 10/2015  . Hypersomnolence   . Migraine   . Multiple sclerosis ([place])   . Neuromuscular disorder ([place])   .  Obesity, unspecified   . Osteoporosis   . Other and unspecified hyperlipidemia   . TIA (transient ischemic attack) 20   Social History   Social History Narrative   Patient is married [name]), has 2 children   Patient is right handed   Education level is 2 yrs of college   Caffeine consumption is 3-4 cups daily   Social History   Tobacco Use  . Smoking status: Former Smoker    Packs/day: 2.00    Years: 37.00    Pack years: 74.00    Types: Cigarettes    Last attempt to quit: 02/26/2004    Years since quitting: 13.8  . Smokeless tobacco: Never Used  Substance Use Topics  . Alcohol use: Yes    Comment: occasionally   . Drug use: No   family history includes Cancer in her brother, maternal  grandfather, maternal grandmother, paternal grandfather, and paternal grandmother; Emphysema in her father; Heart attack in her brother; Irritable bowel syndrome in her son.     Objective:  BP 132/84   Pulse 71   Temp 98.4 F (36.9 C) (Oral)   Resp 18   Ht 5' 4.5" (1.638 m)   Wt 164 lb 9.6 oz (74.7 kg)   SpO2 99%   BMI 27.82 kg/m  Body mass index is 27.82 kg/m.  Wt Readings from Last 3 Encounters:  01/01/18 164 lb 9.6 oz (74.7 kg)  12/30/17 164 lb (74.4 kg)  12/23/17 164 lb (74.4 kg)    Physical Exam  Constitutional: She is oriented to person, place, and time. She appears well-developed and well-nourished.  HENT:  Head: Normocephalic and atraumatic.  Right Ear: Hearing and external ear normal.  Left Ear: Hearing and external ear normal.  Eyes: Conjunctivae are normal.  Neck: Normal range of motion.  Pulmonary/Chest: Effort normal.  Musculoskeletal:       Lumbar back: She exhibits decreased range of motion, tenderness and spasm.       Back:  Neurological: She is alert and oriented to person, place, and time. She has normal strength and normal reflexes. Gait normal.  Skin: Skin is warm and dry.  Psychiatric: Judgment normal.  Vitals reviewed.  Dg Lumbar Spine 2-3 Views  Result Date: 01/01/2018 CLINICAL DATA:  Acute back pain since last night after picking up bed. EXAM: LUMBAR SPINE - 2-3 VIEW COMPARISON:  March 12 2016 FINDINGS: There is no evidence of lumbar spine fracture. There is mild scoliosis of spine. Degenerative joint changes of the lower thoracic spine and, L2-3, L5 are identified without significant interval change. IMPRESSION: No acute fracture dislocation.  Degenerative joint changes of spine. Electronically Signed   By: Abelardo Diesel M.D.   On: 01/01/2018 14:07   Assessment and Plan :  Acute midline low back pain without sciatica - Plan: DG Lumbar Spine 2-3 Views, cyclobenzaprine (FLEXERIL) 5 MG tablet - no compression fracture seen which is great -  encouraged her to be careful of lifting heavy things such as bed due to this condition.  Start flexeril to help with muscle spasms - she has degenerative changes that likely is going to cause this to take long to heal.  Heat and stretches were encouraged.  Warning signs were d/w with the patient.  [facility] - is the ortho that she has seen in the past Pt has Norco at home if she needs  Patient verbalized to me that they understand the following: diagnosis, what is being done for them, what to expect and what  should be done at home.  Their questions have been answered.  See after visit summary for patient specific instructions.  [name] PA-C  [facility] 01/02/2018 12:55 PM  Please note: Portions of this report may have been transcribed using dragon voice recognition software. Every effort was made to ensure accuracy; however, inadvertent computerized transcription errors may be present.

## 2018-01-02 ENCOUNTER — Encounter: Payer: Self-pay | Admitting: Physician Assistant

## 2018-01-06 ENCOUNTER — Encounter: Payer: Self-pay | Admitting: Physician Assistant

## 2018-01-07 NOTE — Telephone Encounter (Signed)
[employer] FML[employer] form on Dr [employer] desk for review, signature.

## 2018-01-08 NOTE — Telephone Encounter (Signed)
To Katie Maldonado in MR.

## 2018-01-08 NOTE — Telephone Encounter (Signed)
Pt  form faxed to [employer].

## 2018-01-13 ENCOUNTER — Telehealth: Payer: Self-pay | Admitting: *Deleted

## 2018-01-13 NOTE — Telephone Encounter (Signed)
-----   Message from Penni Bombard, MD sent at 01/08/2018  5:39 PM EDT ----- Stable imaging results. No new findings. Please call patient. Continue current plan. -VRP

## 2018-01-13 NOTE — Telephone Encounter (Signed)
Spoke to pt and relayed that her MRI results stable, no new findings.  She stated that she had seen on my chart.

## 2018-01-14 MED FILL — MYRBETRIQ ER 25 MG TABLET: 25 | 30 days supply | Qty: 30 | Fill #0

## 2018-01-19 ENCOUNTER — Other Ambulatory Visit: Payer: Self-pay | Admitting: *Deleted

## 2018-01-19 MED ORDER — DIMETHYL FUMARATE 240 MG PO CPDR: 240.0000 mg | DELAYED_RELEASE_CAPSULE | Freq: Two times a day (BID) | ORAL | 1 refills | Status: DC

## 2018-01-28 ENCOUNTER — Other Ambulatory Visit: Payer: Self-pay | Admitting: Diagnostic Neuroimaging

## 2018-01-28 MED FILL — ADDERALL XR 20 MG CAP SA: 20 | 30 days supply | Qty: 30 | Fill #0

## 2018-02-02 ENCOUNTER — Encounter: Payer: Self-pay | Admitting: *Deleted

## 2018-02-19 ENCOUNTER — Other Ambulatory Visit: Payer: Self-pay | Admitting: Physician Assistant

## 2018-02-19 DIAGNOSIS — K219 Gastro-esophageal reflux disease without esophagitis: Secondary | ICD-10-CM

## 2018-02-19 MED FILL — OMEPRAZOLE 20 MG CPDR: 20 | 90 days supply | Qty: 90 | Fill #0

## 2018-02-25 NOTE — Progress Notes (Deleted)
Office Visit Note  Patient: Katie Maldonado             Date of Birth: 06/16/1950           MRN: [id number]             PCP: Leonie Douglas, PA-C Referring: Leonie Douglas, PA* Visit Date: 02/27/2018 Occupation: @GUAROCC @  Subjective:  No chief complaint on file.   History of Present Illness: Katie Maldonado is a 67 y.o. female ***   Activities of Daily Living:  Patient reports morning stiffness for *** {minute/hour:19697}.   Patient {ACTIONS;DENIES/REPORTS:21021675::"Denies"} nocturnal pain.  Difficulty dressing/grooming: {ACTIONS;DENIES/REPORTS:21021675::"Denies"} Difficulty climbing stairs: {ACTIONS;DENIES/REPORTS:21021675::"Denies"} Difficulty getting out of chair: {ACTIONS;DENIES/REPORTS:21021675::"Denies"} Difficulty using hands for taps, buttons, cutlery, and/or writing: {ACTIONS;DENIES/REPORTS:21021675::"Denies"}  No Rheumatology ROS completed.   PMFS History:  Patient Active Problem List   Diagnosis Date Noted  . Osteoarthritis of lumbar spine 10/22/2016  . Primary osteoarthritis of both feet 03/09/2016  . Primary osteoarthritis of both knees 03/09/2016  . Degenerative cervical disc 10/04/2014  . Primary osteoarthritis of both hands 08/19/2014  . Osteoporosis 11/13/2012  . Incontinence of urine 04/18/2011  . HYPERLIPIDEMIA-MIXED 01/08/2010  . MULTIPLE SCLEROSIS 01/08/2010    Past Medical History:  Diagnosis Date  . Allergy   . Anxiety   . Arthritis   . Diverticulitis   . Diverticulosis 10/2015  . Hypersomnolence   . Migraine   . Multiple sclerosis ([place])   . Neuromuscular disorder ([place])   . Obesity, unspecified   . Osteoporosis   . Other and unspecified hyperlipidemia   . TIA (transient ischemic attack) 20    Family History  Problem Relation Age of Onset  . Emphysema Father   . Cancer Brother        bone  . Cancer Maternal Grandmother   . Heart attack Brother   . Cancer Maternal Grandfather   . Cancer Paternal Grandmother    . Cancer Paternal Grandfather   . Irritable bowel syndrome Son    Past Surgical History:  Procedure Laterality Date  . ABDOMINAL HYSTERECTOMY     Fibroids  . arm surgery left    . FRACTURE SURGERY    . TUBAL LIGATION    . WRIST SURGERY     Social History   Social History Narrative   Patient is married [name]), has 2 children   Patient is right handed   Education level is 2 yrs of college   Caffeine consumption is 3-4 cups daily    Objective: Vital Signs: There were no vitals taken for this visit.   Physical Exam   Musculoskeletal Exam: ***  CDAI Exam: CDAI Score: Not documented Patient Global Assessment: Not documented; Provider Global Assessment: Not documented Swollen: Not documented; Tender: Not documented Joint Exam   Not documented   There is currently no information documented on the homunculus. Go to the Rheumatology activity and complete the homunculus joint exam.  Investigation: No additional findings.  Imaging: No results found.  Recent Labs: Lab Results  Component Value Date   WBC 4.5 12/23/2017   HGB 13.0 12/23/2017   PLT 244 12/23/2017   NA 139 12/23/2017   K 4.5 12/23/2017   CL 99 12/23/2017   CO2 26 12/23/2017   GLUCOSE 79 12/23/2017   BUN 11 12/23/2017   CREATININE 0.75 12/23/2017   BILITOT 0.4 12/23/2017   ALKPHOS 77 12/23/2017   AST 20 12/23/2017   ALT 19 12/23/2017   PROT 7.0 12/23/2017   ALBUMIN 4.6  12/23/2017   CALCIUM 9.7 12/23/2017   GFRAA 97 12/23/2017    Speciality Comments: No specialty comments available.  Procedures:  No procedures performed Allergies: Nitrofurantoin; Sulfonamide derivatives; Betaseron [interferon beta-1b]; Cefdinir; and Copaxone [glatiramer acetate]   Assessment / Plan:     Visit Diagnoses: No diagnosis found.   Orders: No orders of the defined types were placed in this encounter.  No orders of the defined types were placed in this encounter.   Face-to-face time spent with patient was ***  minutes. Greater than 50% of time was spent in counseling and coordination of care.  Follow-Up Instructions: No follow-ups on file.   Earnestine Mealing, CMA  Note - This record has been created using Editor, commissioning.  Chart creation errors have been sought, but may not always  have been located. Such creation errors do not reflect on  the standard of medical care.

## 2018-02-27 ENCOUNTER — Ambulatory Visit: Payer: 59 | Admitting: Rheumatology

## 2018-03-02 DIAGNOSIS — D692 Other nonthrombocytopenic purpura: Secondary | ICD-10-CM | POA: Diagnosis not present

## 2018-03-02 DIAGNOSIS — L7211 Pilar cyst: Secondary | ICD-10-CM | POA: Diagnosis not present

## 2018-03-02 DIAGNOSIS — L821 Other seborrheic keratosis: Secondary | ICD-10-CM | POA: Diagnosis not present

## 2018-03-02 DIAGNOSIS — L812 Freckles: Secondary | ICD-10-CM | POA: Diagnosis not present

## 2018-03-02 DIAGNOSIS — D225 Melanocytic nevi of trunk: Secondary | ICD-10-CM | POA: Diagnosis not present

## 2018-03-09 NOTE — Progress Notes (Signed)
Office Visit Note  Patient: Katie Maldonado             Date of Birth: Apr 27, 1951           MRN: [id number]             PCP: Leonie Douglas, PA-C Referring: Leonie Douglas, PA* Visit Date: 03/23/2018 Occupation: @GUAROCC @  Subjective:  Right hip pain.   History of Present Illness: Katie Maldonado is a 67 y.o. female with history of osteoarthritis and degenerative disc disease.  She states she has been having a lot of pain and discomfort in her right hip.  She also fell about 3 weeks ago and landed up on her knees.  She has been having pain and discomfort in her bilateral knee joints since then.  She continues to have some discomfort in her neck and lower back.  She is been experiencing some tingling sensation below her neck.  She has nocturnal pain in her right hip.  Activities of Daily Living:  Patient reports morning stiffness for 60 minutes.   Patient Reports nocturnal pain.  Difficulty dressing/grooming: Denies Difficulty climbing stairs: Reports Difficulty getting out of chair: Reports Difficulty using hands for taps, buttons, cutlery, and/or writing: Denies  Review of Systems  Constitutional: Positive for fatigue. Negative for night sweats, weight gain and weight loss.  HENT: Negative for mouth sores, trouble swallowing, trouble swallowing, mouth dryness and nose dryness.   Eyes: Negative for pain, redness, visual disturbance and dryness.  Respiratory: Negative for cough, shortness of breath and difficulty breathing.   Cardiovascular: Negative for chest pain, palpitations, hypertension, irregular heartbeat and swelling in legs/feet.  Gastrointestinal: Negative for blood in stool, constipation and diarrhea.  Endocrine: Negative for increased urination.  Genitourinary: Negative for vaginal dryness.  Musculoskeletal: Positive for arthralgias, joint pain, myalgias, morning stiffness and myalgias. Negative for joint swelling, muscle weakness and muscle  tenderness.  Skin: Negative for color change, rash, hair loss, skin tightness, ulcers and sensitivity to sunlight.  Allergic/Immunologic: Negative for susceptible to infections.  Neurological: Positive for weakness. Negative for dizziness, memory loss and night sweats.  Hematological: Negative for swollen glands.  Psychiatric/Behavioral: Positive for sleep disturbance. Negative for depressed mood. The patient is nervous/anxious.     PMFS History:  Patient Active Problem List   Diagnosis Date Noted  . Osteoarthritis of lumbar spine 10/22/2016  . Primary osteoarthritis of both feet 03/09/2016  . Primary osteoarthritis of both knees 03/09/2016  . Degenerative cervical disc 10/04/2014  . Primary osteoarthritis of both hands 08/19/2014  . Osteoporosis 11/13/2012  . Incontinence of urine 04/18/2011  . HYPERLIPIDEMIA-MIXED 01/08/2010  . MULTIPLE SCLEROSIS 01/08/2010    Past Medical History:  Diagnosis Date  . Allergy   . Anxiety   . Arthritis   . Diverticulitis   . Diverticulosis 10/2015  . Hypersomnolence   . Migraine   . Multiple sclerosis ([place])   . Neuromuscular disorder ([place])   . Obesity, unspecified   . Osteoporosis   . Other and unspecified hyperlipidemia   . TIA (transient ischemic attack) 20    Family History  Problem Relation Age of Onset  . Emphysema Father   . Cancer Brother        bone  . Cancer Maternal Grandmother   . Heart attack Brother   . Cancer Maternal Grandfather   . Cancer Paternal Grandmother   . Cancer Paternal Grandfather   . Irritable bowel syndrome Son    Past Surgical History:  Procedure  Laterality Date  . ABDOMINAL HYSTERECTOMY     Fibroids  . arm surgery left    . FRACTURE SURGERY    . TUBAL LIGATION    . WRIST SURGERY     Social History   Social History Narrative   Patient is married [name]), has 2 children   Patient is right handed   Education level is 2 yrs of college   Caffeine consumption is 3-4 cups daily     Objective: Vital Signs: BP 115/75 (BP Location: Left Arm, Patient Position: Sitting, Cuff Size: Normal)   Pulse 79   Resp 12   Ht 5\' 4"  (1.626 m)   Wt 162 lb (73.5 kg)   BMI 27.81 kg/m    Physical Exam  Constitutional: She is oriented to person, place, and time. She appears well-developed and well-nourished.  HENT:  Head: Normocephalic and atraumatic.  Eyes: Conjunctivae and EOM are normal.  Neck: Normal range of motion.  Cardiovascular: Normal rate, regular rhythm, normal heart sounds and intact distal pulses.  Pulmonary/Chest: Effort normal and breath sounds normal.  Abdominal: Soft. Bowel sounds are normal.  Lymphadenopathy:    She has no cervical adenopathy.  Neurological: She is alert and oriented to person, place, and time.  Muscle weakness in upper and lower extremities secondary to MS.  Skin: Skin is warm and dry. Capillary refill takes less than 2 seconds.  Psychiatric: She has a normal mood and affect. Her behavior is normal.  Nursing note and vitals reviewed.    Musculoskeletal Exam: Spine thoracic lumbar spine limited range of motion.  She has some muscle spasm in the lumbar paravertebral region.  Shoulder joints elbow joints wrist joints with good range of motion.  She has bilateral CMC PIP and DIP thickening consistent with osteoarthritis.  Hip joints knee joints ankles MTPs were in good range of motion.  She had tenderness over right trochanteric bursa.  No warmth swelling or effusion was noted in her knee joints.  She is crepitus in her bilateral knee joints.  CDAI Exam: CDAI Score: Not documented Patient Global Assessment: Not documented; Provider Global Assessment: Not documented Swollen: Not documented; Tender: Not documented Joint Exam   Not documented   There is currently no information documented on the homunculus. Go to the Rheumatology activity and complete the homunculus joint exam.  Investigation: No additional findings.  Imaging: No results  found.  Recent Labs: Lab Results  Component Value Date   WBC 4.5 12/23/2017   HGB 13.0 12/23/2017   PLT 244 12/23/2017   NA 139 12/23/2017   K 4.5 12/23/2017   CL 99 12/23/2017   CO2 26 12/23/2017   GLUCOSE 79 12/23/2017   BUN 11 12/23/2017   CREATININE 0.75 12/23/2017   BILITOT 0.4 12/23/2017   ALKPHOS 77 12/23/2017   AST 20 12/23/2017   ALT 19 12/23/2017   PROT 7.0 12/23/2017   ALBUMIN 4.6 12/23/2017   CALCIUM 9.7 12/23/2017   GFRAA 97 12/23/2017    Speciality Comments: No specialty comments available.  Procedures:  Large Joint Inj: R greater trochanter on 03/23/2018 11:59 AM Indications: pain Details: 27 G 1.5 in needle, lateral approach  Arthrogram: No  Medications: 40 mg triamcinolone acetonide 40 MG/ML; 1.5 mL lidocaine 1 % Aspirate: 0 mL Outcome: tolerated well, no immediate complications Procedure, treatment alternatives, risks and benefits explained, specific risks discussed. Consent was given by the patient. Immediately prior to procedure a time out was called to verify the correct patient, procedure, equipment, support staff and  site/side marked as required. Patient was prepped and draped in the usual sterile fashion.     Allergies: Nitrofurantoin; Sulfonamide derivatives; Betaseron [interferon beta-1b]; Cefdinir; and Copaxone [glatiramer acetate]   Assessment / Plan:     Visit Diagnoses: Primary osteoarthritis of both hands-joint protection muscle strengthening was discussed.  Right CMC brace use was discussed.  Primary osteoarthritis of both knees-she had recent fall and has been experiencing some knee joint discomfort.  She had no warmth swelling or effusion on examination.  A prescription refill for diclofenac gel was given.  Primary osteoarthritis of both feet-use of proper fitting shoes was discussed.  DDD (degenerative disc disease), cervical-she continues to have some discomfort in her cervical region.  Chronic midline low back pain without  sciatica-she has been experiencing increased discomfort in the lower back and muscle spasm.  I will refer her to physical therapy.  Trochanteric bursitis, right hip-she has been is experiencing increased pain in the right trochanteric bursa, for which different treatment options were discussed and after informed consent was obtained right trochanter area was injected with cortisone as described above.  She was also referred to physical therapy.  Age-related osteoporosis without current pathological fracture - She takes Fosamax 10 mg daily.  She had a DEXA on 04/23/17. Her T-score is -3.4.  History of fatigue-secondary to nocturnal pain.  History of anxiety  History of multiple sclerosis - She is followed by her neurologist every 3-6 months.  History of vitamin D deficiency   Orders: Orders Placed This Encounter  Procedures  . Large Joint Inj   Meds ordered this encounter  Medications  . diclofenac sodium (VOLTAREN) 1 % GEL    Sig: Apply 3 grams to 3 large joints up to 3 times daily    Dispense:  3 Tube    Refill:  3    .  Follow-Up Instructions: Return in about 6 months (around 09/21/2018) for Osteoarthritis.   Bo Merino, MD  Note - This record has been created using Editor, commissioning.  Chart creation errors have been sought, but may not always  have been located. Such creation errors do not reflect on  the standard of medical care.

## 2018-03-23 ENCOUNTER — Other Ambulatory Visit: Payer: Self-pay | Admitting: *Deleted

## 2018-03-23 ENCOUNTER — Ambulatory Visit: Payer: 59 | Admitting: Rheumatology

## 2018-03-23 ENCOUNTER — Encounter: Payer: Self-pay | Admitting: Rheumatology

## 2018-03-23 VITALS — BP 115/75 | HR 79 | Resp 12 | Ht 64.0 in | Wt 162.0 lb

## 2018-03-23 DIAGNOSIS — M7061 Trochanteric bursitis, right hip: Secondary | ICD-10-CM

## 2018-03-23 DIAGNOSIS — G8929 Other chronic pain: Secondary | ICD-10-CM

## 2018-03-23 DIAGNOSIS — M170 Bilateral primary osteoarthritis of knee: Secondary | ICD-10-CM

## 2018-03-23 DIAGNOSIS — M19041 Primary osteoarthritis, right hand: Secondary | ICD-10-CM | POA: Diagnosis not present

## 2018-03-23 DIAGNOSIS — M5030 Other cervical disc degeneration, unspecified cervical region: Secondary | ICD-10-CM

## 2018-03-23 DIAGNOSIS — Z8659 Personal history of other mental and behavioral disorders: Secondary | ICD-10-CM | POA: Diagnosis not present

## 2018-03-23 DIAGNOSIS — G35 Multiple sclerosis: Secondary | ICD-10-CM

## 2018-03-23 DIAGNOSIS — M810 Age-related osteoporosis without current pathological fracture: Secondary | ICD-10-CM

## 2018-03-23 DIAGNOSIS — M19071 Primary osteoarthritis, right ankle and foot: Secondary | ICD-10-CM | POA: Diagnosis not present

## 2018-03-23 DIAGNOSIS — Z8639 Personal history of other endocrine, nutritional and metabolic disease: Secondary | ICD-10-CM

## 2018-03-23 DIAGNOSIS — M5442 Lumbago with sciatica, left side: Secondary | ICD-10-CM

## 2018-03-23 DIAGNOSIS — M19042 Primary osteoarthritis, left hand: Secondary | ICD-10-CM

## 2018-03-23 DIAGNOSIS — M19072 Primary osteoarthritis, left ankle and foot: Secondary | ICD-10-CM

## 2018-03-23 DIAGNOSIS — M545 Low back pain: Secondary | ICD-10-CM | POA: Diagnosis not present

## 2018-03-23 DIAGNOSIS — M5441 Lumbago with sciatica, right side: Secondary | ICD-10-CM

## 2018-03-23 DIAGNOSIS — Z8669 Personal history of other diseases of the nervous system and sense organs: Secondary | ICD-10-CM

## 2018-03-23 DIAGNOSIS — Z87898 Personal history of other specified conditions: Secondary | ICD-10-CM

## 2018-03-23 MED ORDER — LIDOCAINE HCL 1 % IJ SOLN
1.5000 mL | INTRAMUSCULAR | Status: AC | PRN
  Administered 2018-03-23: 1.5 mL

## 2018-03-23 MED ORDER — TRIAMCINOLONE ACETONIDE 40 MG/ML IJ SUSP
40.0000 mg | INTRAMUSCULAR | Status: AC | PRN
  Administered 2018-03-23: 40 mg via INTRA_ARTICULAR

## 2018-03-23 MED ORDER — DICLOFENAC SODIUM 1 % TD GEL: TRANSDERMAL | 3 refills | Status: AC

## 2018-03-23 MED FILL — DICLOFENAC SODIUM 1% GEL: 1 | 12 days supply | Qty: 300 | Fill #0

## 2018-03-23 NOTE — Progress Notes (Signed)
Am rever

## 2018-03-24 ENCOUNTER — Other Ambulatory Visit: Payer: Self-pay | Admitting: Physician Assistant

## 2018-03-24 DIAGNOSIS — F419 Anxiety disorder, unspecified: Secondary | ICD-10-CM

## 2018-03-24 NOTE — Telephone Encounter (Signed)
Left VM; Pt needs TOC appt

## 2018-03-24 NOTE — Telephone Encounter (Signed)
Will route to office for final disposition. 

## 2018-03-30 MED FILL — CITALOPRAM HBR 20 MG TABLET: 20 | 30 days supply | Qty: 30 | Fill #0

## 2018-03-30 NOTE — Telephone Encounter (Signed)
Refilled 30 days, 1 refill, needs to establish care

## 2018-04-08 MED FILL — CITALOPRAM HBR 20 MG TABLET: 20 | 30 days supply | Qty: 30 | Fill #0

## 2018-04-13 DIAGNOSIS — M25551 Pain in right hip: Secondary | ICD-10-CM | POA: Diagnosis not present

## 2018-04-13 DIAGNOSIS — M6281 Muscle weakness (generalized): Secondary | ICD-10-CM | POA: Diagnosis not present

## 2018-04-13 DIAGNOSIS — K5900 Constipation, unspecified: Secondary | ICD-10-CM | POA: Diagnosis not present

## 2018-04-13 DIAGNOSIS — M62838 Other muscle spasm: Secondary | ICD-10-CM | POA: Diagnosis not present

## 2018-04-13 DIAGNOSIS — M545 Low back pain: Secondary | ICD-10-CM | POA: Diagnosis not present

## 2018-04-13 DIAGNOSIS — G8929 Other chronic pain: Secondary | ICD-10-CM | POA: Diagnosis not present

## 2018-04-24 ENCOUNTER — Ambulatory Visit: Payer: Self-pay | Admitting: *Deleted

## 2018-04-24 NOTE — Telephone Encounter (Signed)
Summary: diarrhea nausea dizzy   Pt called and stated that she is having diarrhea nausea and dizziness for 4 days. Pt states that she is not dizzy now and would like a call back regarding. Please advise      Call to patient- patient reports she has taken every over the counter treatment she can get and nothing is stopping the diarrhea. She is passing water now- everything she takes in comes out. She can not tell when the last time she urinated was  Because she states it is all water. She is having dizziness, dry mouth and weakness. Advised ED- patient will go- although the last time she went- she did not get relief. Reason for Disposition . [1] Drinking very little AND [2] dehydration suspected (e.g., no urine > 12 hours, very dry mouth, very lightheaded)  Answer Assessment - Initial Assessment Questions 1. DIARRHEA SEVERITY: "How bad is the diarrhea?" "How many extra stools have you had in the past 24 hours than normal?"    - NO DIARRHEA (SCALE 0)   - MILD (SCALE 1-3): Few loose or mushy BMs; increase of 1-3 stools over normal daily number of stools; mild increase in ostomy output.   -  MODERATE (SCALE 4-7): Increase of 4-6 stools daily over normal; moderate increase in ostomy output. * SEVERE (SCALE 8-10; OR 'WORST POSSIBLE'): Increase of 7 or more stools daily over normal; moderate increase in ostomy output; incontinence.     Severe- at least 12 times 2. ONSET: "When did the diarrhea begin?"      Tuesday morning 3. BM CONSISTENCY: "How loose or watery is the diarrhea?"      watery 4. VOMITING: "Are you also vomiting?" If so, ask: "How many times in the past 24 hours?"      Patient takes Zofran- she has been nauseous 5. ABDOMINAL PAIN: "Are you having any abdominal pain?" If yes: "What does it feel like?" (e.g., crampy, dull, intermittent, constant)      Stomach is sore 6. ABDOMINAL PAIN SEVERITY: If present, ask: "How bad is the pain?"  (e.g., Scale 1-10; mild, moderate, or severe)   -  MILD (1-3): doesn't interfere with normal activities, abdomen soft and not tender to touch    - MODERATE (4-7): interferes with normal activities or awakens from sleep, tender to touch    - SEVERE (8-10): excruciating pain, doubled over, unable to do any normal activities       5- moderate 7. ORAL INTAKE: If vomiting, "Have you been able to drink liquids?" "How much fluids have you had in the past 24 hours?"     Yes- pass through 8. HYDRATION: "Any signs of dehydration?" (e.g., dry mouth [not just dry lips], too weak to stand, dizziness, new weight loss) "When did you last urinate?"     Dizziness, dry mouth, fatigue    Patient does not rememeber 9. EXPOSURE: "Have you traveled to a foreign country recently?" "Have you been exposed to anyone with diarrhea?" "Could you have eaten any food that was spoiled?"     no 10. ANTIBIOTIC USE: "Are you taking antibiotics now or have you taken antibiotics in the past 2 months?"       no 11. OTHER SYMPTOMS: "Do you have any other symptoms?" (e.g., fever, blood in stool)       no 12. PREGNANCY: "Is there any chance you are pregnant?" "When was your last menstrual period?"       n/a  Protocols used: DIARRHEA-A-AH

## 2018-04-26 DIAGNOSIS — R110 Nausea: Secondary | ICD-10-CM | POA: Diagnosis not present

## 2018-04-26 DIAGNOSIS — R197 Diarrhea, unspecified: Secondary | ICD-10-CM | POA: Diagnosis not present

## 2018-04-27 MED FILL — ONDANSETRON HCL 8 MG TABLET: 8 | 5 days supply | Qty: 30 | Fill #0

## 2018-04-30 DIAGNOSIS — R194 Change in bowel habit: Secondary | ICD-10-CM | POA: Diagnosis not present

## 2018-04-30 DIAGNOSIS — R197 Diarrhea, unspecified: Secondary | ICD-10-CM | POA: Diagnosis not present

## 2018-04-30 DIAGNOSIS — K5730 Diverticulosis of large intestine without perforation or abscess without bleeding: Secondary | ICD-10-CM | POA: Diagnosis not present

## 2018-04-30 DIAGNOSIS — Z86010 Personal history of colonic polyps: Secondary | ICD-10-CM | POA: Diagnosis not present

## 2018-05-12 MED FILL — OMEPRAZOLE 20 MG CPDR: 20 | 90 days supply | Qty: 90 | Fill #0

## 2018-05-12 MED FILL — CITALOPRAM HBR 20 MG TABLET: 20 | 30 days supply | Qty: 30 | Fill #1

## 2018-05-18 ENCOUNTER — Encounter: Payer: Self-pay | Admitting: Family Medicine

## 2018-05-18 ENCOUNTER — Telehealth: Payer: 59 | Admitting: Family Medicine

## 2018-05-18 DIAGNOSIS — N390 Urinary tract infection, site not specified: Secondary | ICD-10-CM

## 2018-05-18 MED ORDER — CIPROFLOXACIN HCL 500 MG PO TABS: 500.0000 mg | ORAL_TABLET | Freq: Two times a day (BID) | ORAL | 0 refills | Status: DC

## 2018-05-18 MED FILL — CIPROFLOXACIN HCL 500 MG TA: 500 | 3 days supply | Qty: 6 | Fill #0

## 2018-05-18 NOTE — Progress Notes (Signed)
We are sorry that you are not feeling well.  Here is how we plan to help!  Based on what you shared with me it looks like you most likely have a simple urinary tract infection.  A UTI (Urinary Tract Infection) is a bacterial infection of the bladder.  Most cases of urinary tract infections are simple to treat but a key part of your care is to encourage you to drink plenty of fluids and watch your symptoms carefully.  I have prescribed cipro 500 mg BID x 3 days.  Your symptoms should gradually improve. Call us if the burning in your urine worsens, you develop worsening fever, back pain or pelvic pain or if your symptoms do not resolve after completing the antibiotic.  This is your 2nd documented UTI in the last 6 months that has been treated with e-visits. If you experience these symptoms again in the next 6 months (180 days) you will need a face to face evaluation with your PCP or a level of care that can complete urine lab work with vital signs. This recommendation is for your safety. If your symptoms have not improved in 24-48 hours please seek face to face evaluation. Thanks.  Urinary tract infections can be prevented by drinking plenty of water to keep your body hydrated.  Also be sure when you wipe, wipe from front to back and don't hold it in!  If possible, empty your bladder every 4 hours.  Your e-visit answers were reviewed by a board certified advanced clinical practitioner to complete your personal care plan.  Depending on the condition, your plan could have included both over the counter or prescription medications.  If there is a problem please reply  once you have received a response from your provider.  Your safety is important to [place].  If you have drug allergies check your prescription carefully.    You can use MyChart to ask questions about today's visit, request a non-urgent call back, or ask for a work or school excuse for 24 hours related to this e-Visit. If it has been greater  than 24 hours you will need to follow up with your provider, or enter a new e-Visit to address those concerns.   You will get an e-mail in the next two days asking about your experience.  I hope that your e-visit has been valuable and will speed your recovery. Thank you for using e-visits.

## 2018-05-19 DIAGNOSIS — R300 Dysuria: Secondary | ICD-10-CM | POA: Diagnosis not present

## 2018-05-19 DIAGNOSIS — R42 Dizziness and giddiness: Secondary | ICD-10-CM | POA: Diagnosis not present

## 2018-05-19 DIAGNOSIS — A499 Bacterial infection, unspecified: Secondary | ICD-10-CM | POA: Diagnosis not present

## 2018-05-19 DIAGNOSIS — N390 Urinary tract infection, site not specified: Secondary | ICD-10-CM | POA: Diagnosis not present

## 2018-05-19 MED FILL — AMOXICILLIN 500 MG CAPSULE: 500 | 10 days supply | Qty: 30 | Fill #0

## 2018-05-22 ENCOUNTER — Telehealth: Payer: 59 | Admitting: Family

## 2018-05-22 DIAGNOSIS — R42 Dizziness and giddiness: Secondary | ICD-10-CM

## 2018-05-22 MED ORDER — MECLIZINE HCL 12.5 MG PO TABS: 25.0000 mg | ORAL_TABLET | Freq: Three times a day (TID) | ORAL | Status: AC | PRN

## 2018-05-22 NOTE — Progress Notes (Signed)
Thank you for the details you included in the comment boxes. Those details are very helpful in determining the best course of treatment for you and help [place] to provide the best care.   Amoxicillin is not ideal for a UTI. We prescribed Cipro for you at the [facility] clinic for your UTI: Please pick this up today ASAP. An untreated UTI can spread into your kidneys and bloodstream and cause major problems, including dizziness, confusion, fever, back/belly pain, shock, and loss of consciousness.  Regarding the vertigo vs. Motion sickness, they are not the same. Vertigo may have many causes, many of which are unclear when attempting to diagnose the cause. Infection is one of those causes. Motion sickness is typically from motion events and may worsen as your eyes try to focus on a stable object while your body is moving (reading in the car, activities on cruise ships or fishing boats).   Fortunately, the treatments that work for motion sickness can also help with Vertigo, including Dramamine. See plan below. Please pick up the UTI prescription unless your primary care advised against it when they saw you.   Phone log: I called you at 12:34pm and would like to speak to you regarding your visit. I will try to call you one more time before I close the visit with the plan above.   Phone log: (Sent message to pharmacy, then called pharmacy) Spoke to Glade Nurse, [facility] for 7 minutes at 12:51pm and Cyclizine is unavailable except internationally. He advised to change to Meclizine as it would be less risk than Dramamine for age 68.   Phone log: Called patient and spoke to her at 13:06, spoke for 8 minutes. Pt will now go to [place] to pick up Meclizine (she responded well historically), and Cipro and will stop the Amoxicillin.    E Visit for Motion Sickness  We are sorry that you are not feeling well. Here is how we plan to help!  Based on what you have shared with me it looks like you have symptoms of  motion sickness.  I have prescribed a medication that will help prevent or alleviate your symptoms:  Meclizine 25mg  by mouth every 8 hours as needed for dizziness.    Prevention:  You might feel better if you keep your eyes focused on outside while you are in motion. For example, if you are in a car, sit in the front and look in the direction you are moving; if you are on a boat, stay on the deck and look to the horizon. This helps make what you see match the movement you are feeling, and so you are less likely to feel sick.  You should also avoid reading, watching a movie, texting or reading messages, or looking at things close to you inside the vehicle you are riding in.  . Use the seat head rest. Lean your head against the back of the seat or head rest when traveling in vehicles with seats to minimize head movements.  . On a ship: When making your reservations, choose a cabin in the middle of the ship and near the waterline. When on board, go up on deck and focus on the horizon.  . In an airplane: Request a window seat and look out the window. A seat over the front edge of the wing is the most preferable spot (the degree of motion is the lowest here). Direct the air vent to blow cool air on your face.  . On a  train: Always face forward and sit near a window.  . In a vehicle: Sit in the front seat; if you are the passenger, look at the scenery in the distance. For some people, driving the vehicle (rather than being a passenger) is an instant remedy.  . Avoid others who have become nauseous with motion sickness. Seeing and smelling others who have motion sickness may cause you to become sick.  GET HELP RIGHT AWAY IF:   Your symptoms do not improve or worsen within 2 days after treatment.   You cannot keep down fluids after trying the medication.   Other associated symptoms such as severe headache, visual field changes, fever, or intractable nausea and vomiting.  MAKE SURE  YOU:   Understand these instructions.  Will watch your condition.  Will get help right away if you are not doing well or get worse.  Thank you for choosing an e-visit.  Your e-visit answers were reviewed by a board certified advanced clinical practitioner to complete your personal care plan. Depending upon the condition, your plan could have included both over the counter or prescription medications.  Please review your pharmacy choice. Be sure that the pharmacy you have chosen is open so that you can pick up your prescription now.  If there is a problem you may message your provider in [facility] to have the prescription routed to another pharmacy.  Your safety is important to [place]. If you have drug allergies check your prescription carefully.   For the next 24 hours, you can use MyChart to ask questions about today's visit, request a non-urgent call back, or ask for a work or school excuse from your e-visit provider.  You will get an e-mail in the next two days asking about your experience. I hope that your e-visit has been valuable and will speed your recovery.   References or for more information: [email or web address] [email or web address] [email or web address]

## 2018-06-04 DIAGNOSIS — H524 Presbyopia: Secondary | ICD-10-CM | POA: Diagnosis not present

## 2018-06-04 DIAGNOSIS — H25093 Other age-related incipient cataract, bilateral: Secondary | ICD-10-CM | POA: Diagnosis not present

## 2018-06-04 DIAGNOSIS — H5203 Hypermetropia, bilateral: Secondary | ICD-10-CM | POA: Diagnosis not present

## 2018-06-04 DIAGNOSIS — H52223 Regular astigmatism, bilateral: Secondary | ICD-10-CM | POA: Diagnosis not present

## 2018-06-04 DIAGNOSIS — H5053 Vertical heterophoria: Secondary | ICD-10-CM | POA: Diagnosis not present

## 2018-06-04 DIAGNOSIS — H469 Unspecified optic neuritis: Secondary | ICD-10-CM | POA: Diagnosis not present

## 2018-06-15 DIAGNOSIS — L820 Inflamed seborrheic keratosis: Secondary | ICD-10-CM | POA: Diagnosis not present

## 2018-06-15 DIAGNOSIS — L821 Other seborrheic keratosis: Secondary | ICD-10-CM | POA: Diagnosis not present

## 2018-06-15 DIAGNOSIS — D225 Melanocytic nevi of trunk: Secondary | ICD-10-CM | POA: Diagnosis not present

## 2018-06-15 DIAGNOSIS — L814 Other melanin hyperpigmentation: Secondary | ICD-10-CM | POA: Diagnosis not present

## 2018-06-15 DIAGNOSIS — D1801 Hemangioma of skin and subcutaneous tissue: Secondary | ICD-10-CM | POA: Diagnosis not present

## 2018-06-17 ENCOUNTER — Other Ambulatory Visit: Payer: Self-pay | Admitting: Family Medicine

## 2018-06-17 DIAGNOSIS — F419 Anxiety disorder, unspecified: Secondary | ICD-10-CM

## 2018-06-17 MED FILL — CITALOPRAM HBR 20 MG TABLET: 20 | 30 days supply | Qty: 30 | Fill #0

## 2018-06-17 NOTE — Telephone Encounter (Signed)
Requested Prescriptions  Pending Prescriptions Disp Refills  . citalopram (CELEXA) 20 MG tablet [Pharmacy Med Name: CITALOPRAM HBR 20 MG TABLET 20 TAB] 30 tablet 1    Sig: TAKE 1 TABLET BY MOUTH ONCE DAILY     Psychiatry:  Antidepressants - SSRI Passed - 06/17/2018  9:34 AM      Passed - Valid encounter within last 6 months    Recent Outpatient Visits          5 months ago Acute midline low back pain without sciatica   [facility] [place], PA-C   8 months ago Edema, unspecified type   [facility] [place], [place] D, PA-C   8 months ago Edema, unspecified type   [facility] at [place], [place] D, PA-C   1 year ago Annual physical exam   [facility] at [place], [place] D, PA-C   1 year ago Fall, initial encounter   [facility] at [place], Reather Laurence, PA-C      Future Appointments            In 3 months Bo Merino, MD [facility] Rheumatology

## 2018-06-18 ENCOUNTER — Encounter: Payer: Self-pay | Admitting: *Deleted

## 2018-06-18 ENCOUNTER — Telehealth: Payer: Self-pay | Admitting: *Deleted

## 2018-06-18 NOTE — Telephone Encounter (Addendum)
Received fax from [facility] re: specialty medication referral from for Tecfidera. Form ready for Dr Gladstone Lighter completion,  Signature. Due to inclement weather, this office will close today at noon, closed tomorrow. Dr Colen Darling will not be in. I sent patient a my chart asking if she needs medication soon.

## 2018-06-18 NOTE — Telephone Encounter (Signed)
LVM advising the patient of my chart message sent. I advised her of office closing at noon today and closed tomorrow due to weather. Her tecfidera was last refilled Sept 2019 for 6 months. She should have refills until March 2020.

## 2018-06-22 ENCOUNTER — Encounter: Payer: Self-pay | Admitting: *Deleted

## 2018-06-22 NOTE — Telephone Encounter (Signed)
Tecfidera refill papers faxed back to [facility] pharmacy.

## 2018-06-29 NOTE — Telephone Encounter (Signed)
Received message from patient: I called [facility] to refill the Tecfidera today and they said they have not received the authorization back from Dr. Leta Baptist yet.  They said for me to ask you what the prescription says the to and from dates for new prescription and also the authorization number or case # or what number is on the prescription I was wondering if you could fax a copy of it to me at [phone] if you get a spare minute.  I replied and advised her I faxed Rx on 06/22/18 and faxed again this morning. She replied stating they say they have not received it. She has requested I fax a copy of Rx to her.   I  called [employer] to check on PA. Seth Bake stated Tecfidera is approved 06/25/2018 through 06/25/2019, auth # [id number]. She tried to transfer me to [facility] to check on Rx I faxed on 06/22/18 and this morning. She stated she got recording: high call volume. I'll call back later.  I messaged patient with PA approval dates, auth #. I faxed approval dates, auth # to [facility].

## 2018-06-29 NOTE — Addendum Note (Signed)
Addended by: Lanna Poche R on: 06/29/2018 11:30 AM   Modules accepted: Orders

## 2018-07-01 DIAGNOSIS — N3020 Other chronic cystitis without hematuria: Secondary | ICD-10-CM | POA: Diagnosis not present

## 2018-07-01 DIAGNOSIS — N3281 Overactive bladder: Secondary | ICD-10-CM | POA: Diagnosis not present

## 2018-07-01 DIAGNOSIS — N3946 Mixed incontinence: Secondary | ICD-10-CM | POA: Diagnosis not present

## 2018-07-15 ENCOUNTER — Other Ambulatory Visit: Payer: Self-pay | Admitting: Physician Assistant

## 2018-07-15 ENCOUNTER — Other Ambulatory Visit: Payer: Self-pay | Admitting: Diagnostic Neuroimaging

## 2018-07-15 DIAGNOSIS — R110 Nausea: Secondary | ICD-10-CM

## 2018-07-15 MED FILL — ADDERALL XR 20 MG CAP SA: 20 | 30 days supply | Qty: 30 | Fill #0

## 2018-07-15 MED FILL — MYRBETRIQ ER 25 MG TABLET: 25 | 30 days supply | Qty: 30 | Fill #1

## 2018-07-15 MED FILL — CITALOPRAM HBR 20 MG TABLET: 20 | 30 days supply | Qty: 30 | Fill #1

## 2018-07-15 MED FILL — OMEPRAZOLE 20 MG CPDR: 20 | 90 days supply | Qty: 90 | Fill #1

## 2018-07-15 NOTE — Telephone Encounter (Signed)
Requested medication (s) are due for refill today: yes  Requested medication (s) are on the active medication list: yes  Last refill: 10/01/17  Future visit scheduled:no  Notes to clinic:  Not delegated    Requested Prescriptions  Pending Prescriptions Disp Refills   ondansetron (ZOFRAN-ODT) 4 MG disintegrating tablet [Pharmacy Med Name: ONDANSETRON ODT 4 MG TABLET 4 TAB] 60 tablet 0    Sig: DISSOLVE 1 TABLET BY MOUTH EVERY 8 HOURS AS NEEDED FOR NAUSEA AND VOMITING     Not Delegated - Gastroenterology: Antiemetics Failed - 07/15/2018 11:55 AM      Failed - This refill cannot be delegated      Failed - Valid encounter within last 6 months    Recent Outpatient Visits          6 months ago Acute midline low back pain without sciatica   [facility] at [place], PA-C   9 months ago Edema, unspecified type   [facility] at [place], [place] D, PA-C   9 months ago Edema, unspecified type   [facility] at [place], [place] D, PA-C   1 year ago Annual physical exam   [facility] at [place], [place] D, PA-C   1 year ago Fall, initial encounter   [facility] at [facility], Reather Laurence, PA-C      Future Appointments            In 2 months Bo Merino, MD [facility] Rheumatology

## 2018-07-22 MED FILL — ONDANSETRON HCL 8 MG TABLET: 8 | 30 days supply | Qty: 30 | Fill #0

## 2018-07-30 DIAGNOSIS — N3946 Mixed incontinence: Secondary | ICD-10-CM | POA: Diagnosis not present

## 2018-07-30 DIAGNOSIS — R8271 Bacteriuria: Secondary | ICD-10-CM | POA: Diagnosis not present

## 2018-07-30 DIAGNOSIS — N3020 Other chronic cystitis without hematuria: Secondary | ICD-10-CM | POA: Diagnosis not present

## 2018-08-03 DIAGNOSIS — K219 Gastro-esophageal reflux disease without esophagitis: Secondary | ICD-10-CM | POA: Diagnosis not present

## 2018-08-03 DIAGNOSIS — K5904 Chronic idiopathic constipation: Secondary | ICD-10-CM | POA: Diagnosis not present

## 2018-08-03 DIAGNOSIS — Z86010 Personal history of colonic polyps: Secondary | ICD-10-CM | POA: Diagnosis not present

## 2018-08-03 DIAGNOSIS — K5730 Diverticulosis of large intestine without perforation or abscess without bleeding: Secondary | ICD-10-CM | POA: Diagnosis not present

## 2018-08-12 ENCOUNTER — Telehealth: Payer: Self-pay | Admitting: *Deleted

## 2018-08-12 NOTE — Telephone Encounter (Signed)
Called patient and advised her due to current COVID 19 pandemic, our office is severely reducing in person visits in order to minimize the risk to our patients and healthcare providers. We recommend to convert your appointment to a video visit. We'll take all precautions to reduce any security or privacy concerns. This will be treated like an office visit, and we will file with your insurance. She consented to video visit. Pt's email is [email or web address]. Pt understands that the [employer] webex software must be downloaded and operational on the device pt plans to use for the visit. Updated EMR. She verbalized understanding, appreciation. Webex scheduled; e mail sent.

## 2018-08-17 ENCOUNTER — Encounter: Payer: Self-pay | Admitting: Diagnostic Neuroimaging

## 2018-08-17 ENCOUNTER — Ambulatory Visit (INDEPENDENT_AMBULATORY_CARE_PROVIDER_SITE_OTHER): Payer: 59 | Admitting: Diagnostic Neuroimaging

## 2018-08-17 ENCOUNTER — Other Ambulatory Visit: Payer: Self-pay

## 2018-08-17 DIAGNOSIS — R5383 Other fatigue: Secondary | ICD-10-CM

## 2018-08-17 DIAGNOSIS — G35 Multiple sclerosis: Secondary | ICD-10-CM

## 2018-08-17 NOTE — Progress Notes (Signed)
PATIENT: Katie Maldonado DOB: 30-May-1950   REASON FOR VISIT: follow up for MS HISTORY FROM: patient  Chief Complaint  Patient presents with  . Multiple Sclerosis     HISTORY OF PRESENT ILLNESS:  UPDATE (08/17/18, VRP): Since last visit, having more UTI, constipation, and falls. Tolerating tecfidera. No alleviating or aggravating factors.    UPDATE (12/23/17, VRP): Since last visit, doing well. Symptoms are stable, except for new onset of vertigo symptoms a few weeks ago. Severity is mild. No alleviating or aggravating factors. Tolerating meds.    UPDATE (06/18/17, VRP): Since last visit, doing about the same. Tolerating meds. No alleviating or aggravating factors.   UPDATE 12/16/16: Since last visit, doing about the same. Still with fatigue. Tolerating tecfidera. Denies depression. Sleep is stable.   UPDATE 06/18/16: Since last visit, doing better. Better energy and concentration. PT helping with back pain.   UPDATE 03/06/16: Since last visit, continues with fatigue, poor attention, difficulty at work. She is in corrective action due to errors. Denies depression, but has excessive daytime fatigue.   UPDATE 08/31/15: Since last visit continues with fatigue, pain, numbness. Tolerating tecfidera. Has tried PT. Urinary issues better. Now recovering from right arm celluitis (was painting, developed blister, then got infected; now better on oral antibiotics).  UPDATE 07/07/15: Since last visit, still with left neck pain radiating to the left arm. Numb/tingling. Has h/o left forearm/wrist fracture (2005 and 2010; s/p surgery). Amantadine not helping with fatigue. Denies depression.   UPDATE 05/31/15: Since last visit, sxs progressing since Oct 2016. More fatigue, more bladder/bowel issues. More left arm numbness.  UPDATE 11/28/14: Since last visit, continues to have prickly, tingling sensation in whole body, hot flash sensations, pain, neck issues. Had 2 falls, but no injuries.  UPDATE  08/23/14: Since last visit, has been on tecfidera since beginning March 2016. She has noted more general numbness, fatigue, constipation, drawstring sensation in calves/hamstrings in past 3 weeks.   UPDATE 05/18/14: MS symptoms stable. Has had 2 reactions to copaxone, leading to tinging, prickly sensations over whole body. 1 event led to throat tightness, tongue feeling thick, and worried patient. Here to discuss possibilities of switching therapies.  UPDATE 01/04/14 (VRP): Since last visit, doing the same. No new neuro symptoms. Does report persistent fatigue, weight gain, incr appetite, feeling hot. Tolerating copaxone. Misses 2-3 injections per month.   UPDATE 06/30/13 (LL):  Since last visit patient continues to have problems with fatigue and malaise, numbness and paresthesias in left arm which is new for her, started a couple weeks ago.  She states it is constant, does not hurt, but annoying.  She is still able to work.  Follow up MRI brain at last visit showed no acute or new lesions, with moderate cerebral atrophy.  UPDATE 12/30/12 (VP): Since last visit patient continues to have problems with fatigue, insomnia, anxiety, shortness of breath. For past 6 months she's been having dyspnea on exertion especially climbing steps at her work. She has been evaluated by PCP without specific diagnosis. Patient continues on Copaxone (now 3 times per week dosing). Last MRI brain was in 2011. Regarding fatigue she has been on Provigil and Ritalin in the past without relief. Regarding anxiety she was on Paxil in the past but this caused nausea and stomach problems.   PRIOR HPI (05/02/12, Dr. 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 Her MRI of brain 06/10/1999  showed white matter abnormalities not typical for MS and MRI of the cervical spine without contrast 07/27/99 showed a focal area of abnormality in  the spinal cord at C2-3. She had positive CSF IgG index and oligoclonal banding. Initially the VER was normal with subsequent VERs 12/04/1999, 12/10/1999, and 02/01/2000 abnormal in the right eye. She began Avonex which she took for one year and switched to Rebif beginning 08/2000. She was then switched to Betaseron 04/2001 because her insurance would not pay for Rebif, but was subsequently switched back to Rebif 04/2003. Because of elevated liver function tests she was switched to Copaxone 07/03/2005.She has felt better on Copaxone then on the interferons. Her visual acuity varies from 20/30 to 20/40 in the left eye and to 20/200 in the right. She had burning paresthesias treated with gabapentin and at times amitriptyline. NMO blood test was negative 02/21/2005. She has been followed Dr. Estanislado Pandy, rheumatologist for the question of RA versus sarcoidosis and by Dr.Clinton Young for hypersomnolence without a diagnosis of narcolepsy or obstructive Sleep apnea. MRI studies of the brain and cervical spine 02/08/2010 showed periventricular lesions and unchanged lesion at C2-3. CBC and CMP 03/17/2009 were normal. CBC, CMP, TSH, lipid profile except LDL 151 were normal 08/07/09.vitamin D level 10/16/2009 was 41. She complains of fatigue and her right foot turning in an occasional foot cramping. She fell 10/2009 fracturing her right foot in a [employer] parking lot. In 2010 she fractured her left foot, left wrist, and her left arm. She has continued swelling in her right foot and lower leg. She has an overactive bladder followed by Dr.Dalhstedt. She has a history of Lhermitte's sign worse with neck extension but also present with flexion. She had numbness on the right side of her body except her face and head for one month.Her bladder symptoms improved on enablex.She walks 1.8 miles 3 times per week. 03/05/2011 she was lying in bed asleep and awoke trying to sit up and developed dizziness on 2 different occasions. She fell back in to the  bed and went to sleep. On the third episode she became concerned. The episodes of dizziness would last seconds and are described as spinning. She had dizziness during the day without spinning She did not have head trauma or a cold. She was seen by Dr. Elder Cyphers at urgent care and underwent blood studies, EKG, and urinalysis. She has a history of migraine and a history of motion sickness. She has a Lhermitte's sign when she turns her head to the left with discomfort in her neck going into her left arm. This can occur 2 times per day and lasts Less than 1 minute. She has bladder symptoms and right flank pain and is concerned about another urinary tract infection. She can awaken with snoring when she has a cold. She sleeps well. She has dizziness 2 or 3 times per week when she lies down and turns her head to the left.    REVIEW OF SYSTEMS: Full 14 system review of systems performed and negative except: as per HPI.    ALLERGIES: Allergies  Allergen Reactions  . Nitrofurantoin Shortness Of Breath    Shortness of breath, violent chills, dizziness, nausea  . Sulfonamide Derivatives Shortness Of Breath    Shortness of breath, chills  . Betaseron [Interferon Beta-1b]     Shortness of breath, rash, heart palpatations  . Cefdinir     Can't remember what rx had 2 yrs ago  . [place]  MEDICATIONS: Outpatient Medications Prior to Visit  Medication Sig Dispense Refill  . ADDERALL XR 20 MG 24 hr capsule TAKE 1 CAPSULE BY MOUTH DAILY. 30 capsule 0  . alendronate (FOSAMAX) 10 MG tablet Take 1 tablet (10 mg total) by mouth daily before breakfast. Take with a full glass of water on an empty stomach. Avoid lying down x 30 min. 90 tablet 3  . calcium acetate (PHOSLO) 667 MG capsule Take by mouth 3 (three) times daily with meals.    . Cholecalciferol (VITAMIN D3) 5000 units CAPS Take by mouth daily.    . ciprofloxacin (CIPRO) 500 MG tablet Take 1 tablet (500 mg total) by mouth 2 (two)  times daily. 6 tablet 0  . citalopram (CELEXA) 20 MG tablet TAKE 1 TABLET BY MOUTH ONCE DAILY 30 tablet 1  . cyclobenzaprine (FLEXERIL) 5 MG tablet Take 1 tablet (5 mg total) by mouth 3 (three) times daily as needed for muscle spasms. 30 tablet 0  . diclofenac sodium (VOLTAREN) 1 % GEL Apply 3 grams to 3 large joints up to 3 times daily 3 Tube 3  . Dimethyl Fumarate (TECFIDERA) 240 MG CPDR Take 1 capsule (240 mg total) by mouth 2 (two) times daily. 180 capsule 1  . ibuprofen (ADVIL,MOTRIN) 200 MG tablet Take 2 tablets (400 mg total) by mouth 4 (four) times daily.    Marland Kitchen linaclotide (LINZESS) 145 MCG CAPS capsule Take 145 mcg by mouth daily before breakfast.    . Mirabegron (MYRBETRIQ PO) Take by mouth.    . nitrofurantoin, macrocrystal-monohydrate, (MACROBID) 100 MG capsule Take 100 mg by mouth daily.    . Omega-3 Fatty Acids (FISH OIL) 1000 MG CAPS Take by mouth daily.    Marland Kitchen omeprazole (PRILOSEC) 20 MG capsule TAKE 1 CAPSULE BY MOUTH ONCE DAILY 90 capsule 3  . ondansetron (ZOFRAN-ODT) 4 MG disintegrating tablet DISSOLVE 1 TABLET BY MOUTH EVERY 8 HOURS AS NEEDED FOR NAUSEA AND VOMITING 60 tablet 0   Facility-Administered Medications Prior to Visit  Medication Dose Route Frequency Provider Last Rate Last Dose  . meclizine (ANTIVERT) tablet 25 mg  25 mg Oral Q8H PRN Withrow, Elyse Jarvis, FNP       PHYSICAL EXAM  Video exam  GENERAL EXAM/CONSTITUTIONAL: Vitals:  There were no vitals filed for this visit. There is no height or weight on file to calculate BMI. Wt Readings from Last 3 Encounters:  03/23/18 162 lb (73.5 kg)  01/01/18 164 lb 9.6 oz (74.7 kg)  12/30/17 164 lb (74.4 kg)    Patient is in no distress; well developed, nourished and groomed; neck is supple   NEUROLOGIC: MENTAL STATUS:  No flowsheet data found.  awake, alert, oriented to person, place and time  recent and remote memory intact  normal attention and concentration  language fluent, comprehension intact, naming  intact  fund of knowledge appropriate  CRANIAL NERVE:   2nd, 3rd, 4th, 6th - visual fields full to confrontation, extraocular muscles intact, no nystagmus  5th - facial sensation symmetric  7th - facial strength symmetric  8th - hearing intact  9th - palate elevates symmetrically, uvula midline  11th - shoulder shrug symmetric  12th - tongue protrusion midline  MOTOR:   NO TREMOR, NO DRIFT  SENSORY:   normal and symmetric to light touch  COORDINATION:   fine finger movements normal  GAIT/STATION:   ABLE TO STAND AND WALK; STABLE    DIAGNOSTIC DATA (LABS, IMAGING, TESTING) - I reviewed patient records, labs, notes, testing and  imaging myself where available.  Lab Results  Component Value Date   WBC 4.5 12/23/2017   HGB 13.0 12/23/2017   HCT 41.2 12/23/2017   MCV 86 12/23/2017   PLT 244 12/23/2017   Lymphocytes Absolute  Date Value Ref Range Status  12/23/2017 0.7 0.7 - 3.1 x10E3/uL Final  10/10/2017 0.6 (L) 0.7 - 3.1 x10E3/uL Final  10/09/2017 0.5 (L) 0.7 - 3.1 x10E3/uL Final  06/05/2017 0.8 0.7 - 3.1 x10E3/uL Final  06/05/2016 0.7 0.7 - 3.1 x10E3/uL Final  12/05/2015 0.8 0.7 - 3.1 x10E3/uL Final  08/31/2015 0.6 (L) 0.7 - 3.1 x10E3/uL Final   Lymphs Abs  Date Value Ref Range Status  03/12/2016 559 (L) 850 - 3,900 cells/uL Final   CMP Latest Ref Rng & Units 12/23/2017 09/30/2017 06/05/2017  Glucose 65 - 99 mg/dL 79 97 90  BUN 8 - 27 mg/dL 11 13 15   Creatinine 0.57 - 1.00 mg/dL 0.75 0.72 0.83  Sodium 134 - 144 mmol/L 139 139 139  Potassium 3.5 - 5.2 mmol/L 4.5 4.3 4.5  Chloride 96 - 106 mmol/L 99 100 100  CO2 20 - 29 mmol/L 26 25 26   Calcium 8.7 - 10.3 mg/dL 9.7 9.4 9.5  Total Protein 6.0 - 8.5 g/dL 7.0 6.7 6.7  Total Bilirubin 0.0 - 1.2 mg/dL 0.4 0.4 0.7  Alkaline Phos 39 - 117 IU/L 77 79 91  AST 0 - 40 IU/L 20 21 20   ALT 0 - 32 IU/L 19 18 18    Lab Results  Component Value Date   TSH 3.780 09/30/2017    08/31/14 MRI brain - showing T2/flair  hyperintense foci in the periventricular, deep and subcortical matter in a pattern and configuration consistent with the diagnosis of multiple sclerosis. No acute foci are noted. Generalized cortical atrophy is noted. When compared to a study dated 05/26/2014, there is no interval change.  08/31/14 MRI cervical spine -  1. Severe degenerative changes at C4-C5, C5-C6 and C6-C7 with there is disc protrusion, uncovertebral spurring and facet hypertrophy. There is mild spinal stenosis at each of these 3 levels. There is also moderately severe foraminal narrowing which could lead to impingement of the exiting C5, C6 and C7 nerve roots. 2. Degenerative changes are milder at C2-C3, C3-C4 and C7-T1 and are less likely to lead to nerve root impingement.  3. The spinal cord appears normal.  4. There are no enhancing abnormalities.  5. Compared to the MRI of the cervical spine dated 07/07/2013, there is no definite interval change.   06/14/15 MRI brain  1. Mild periventricular and subcortical and infratentorial chronic demyelinating plaques.  2. No abnormal lesions are seen on post contrast views.  3. No change from MRI on 08/31/14.   06/28/15 MRI cervical  - severe degenerative changes at C4-5, C5-6 and C6-7 resulting in broad-based disc protrusions, uncovertebral spurring and facet hypertrophy resulting in mild canal and moderate bilateral foraminal narrowing with likely encroachment on the exiting C5, C6 and C7 nerve roots. No enhancing or demyelinating lesions are noted. Overall no significant change compared with previous MRI scan dated 08/31/2014.  01/01/17 MRI brain [I reviewed images myself and agree with interpretation. -VRP]  1. Mild periventricular and subcortical and infratentorial chronic demyelinating plaques.  2. No abnormal lesions are seen on post contrast views.  3. No change from MRI on 06/14/15.  12/30/17 MRI brain [I reviewed images myself and agree with interpretation. -VRP]  - Mild  periventricular, subcortical and cerebellar chronic demyelinating plaques. - Mild diffuse  atrophy.  - No acute plaques.  - No change from MRI on 06/14/15.   08/31/15 anti-JCV antibody - 1.71 high/positive    ASSESSMENT AND PLAN  68 y.o. female here with multiple sclerosis since 2001, on copaxone since 2007. Previously on avonex and betaseron.  Then apparent intolerance to copaxone with 2 events of tingling, throat tightness, thick tongue with 2 injections. Now on tecfidera since March 2016. Some progression of symptoms since Oct 2016, but MRI scans are stable.  Left neck and arm symptoms likely related to cervical radiculopathies. Will try conservative treatment options.  Tried gabapentin for numbness --> stopped due to sleepiness.  Tried ritalin, provigil, amantadine for fatigue --> stopped due to in-effectiveness.   Virtual Visit via Video Note  I connected with Cleophas Dunker on 08/17/18 at  3:30 PM EDT by a video enabled telemedicine application and verified that I am speaking with the correct person using two identifiers.  Patient was at home and I was at the office.   I discussed the limitations of evaluation and management by telemedicine and the availability of in person appointments. The patient expressed understanding and agreed to proceed.  I discussed the assessment and treatment plan with the patient. The patient was provided an opportunity to ask questions and all were answered. The patient agreed with the plan and demonstrated an understanding of the instructions.   The patient was advised to call back or seek an in-person evaluation if the symptoms worsen or if the condition fails to improve as anticipated.  I provided 15 minutes of non-face-to-face time during this encounter.    Dx:  Multiple sclerosis ([place])  Other fatigue    PLAN:  MULTIPLE SCLEROSIS - check CBC, CMP every 6 months (last checked April 2020; Dr. Collene Mares - GI) - consider MRI brain,  cervical spine at next visit - continue tecfidera - continue gentle yoga classes and PT exercises - fall precautions; consider cane - continue adderall for fatigue and poor attention  Return in about 4 months (around 12/17/2018).    Penni Bombard, MD 3/53/6144, 3:15 PM Certified in Neurology, Neurophysiology and Neuroimaging  [facility] [address], [place], [place] [address] [phone]

## 2018-08-24 ENCOUNTER — Other Ambulatory Visit: Payer: Self-pay | Admitting: Family Medicine

## 2018-08-24 ENCOUNTER — Other Ambulatory Visit: Payer: Self-pay | Admitting: Diagnostic Neuroimaging

## 2018-08-24 ENCOUNTER — Ambulatory Visit: Payer: 59 | Admitting: Diagnostic Neuroimaging

## 2018-08-24 DIAGNOSIS — F419 Anxiety disorder, unspecified: Secondary | ICD-10-CM

## 2018-08-24 MED FILL — ADDERALL XR 20 MG CAP SA: 20 | 30 days supply | Qty: 30 | Fill #0

## 2018-08-24 MED FILL — MYRBETRIQ ER 50 MG TABLET: 50 | 30 days supply | Qty: 30 | Fill #0

## 2018-08-24 NOTE — Telephone Encounter (Signed)
Requested medication (s) are due for refill today: Yes  Requested medication (s) are on the active medication list: Yes  Last refill:  06/17/18  Future visit scheduled: No  Notes to clinic:  [name] pt.    Requested Prescriptions  Pending Prescriptions Disp Refills   citalopram (CELEXA) 20 MG tablet [Pharmacy Med Name: CITALOPRAM HBR 20 MG TABLET 20 TAB] 30 tablet 1    Sig: TAKE 1 TABLET BY MOUTH ONCE A DAY     Psychiatry:  Antidepressants - SSRI Failed - 08/24/2018 10:10 AM      Failed - Valid encounter within last 6 months    Recent Outpatient Visits          7 months ago Acute midline low back pain without sciatica   [facility], PA-C   10 months ago Edema, unspecified type   [facility], [place] D, PA-C   10 months ago Edema, unspecified type   [facility], [place] D, PA-C   1 year ago Annual physical exam   [facility] [place], [place] D, PA-C   1 year ago Fall, initial encounter   [facility], Reather Laurence, PA-C      Future Appointments            In 4 weeks Bo Merino, MD [facility]

## 2018-09-10 NOTE — Progress Notes (Signed)
Office Visit Note  Patient: Katie Maldonado             Date of Birth: [birth year]           MRN: [id number]             PCP: [facility] [place] Referring: Leonie Douglas, [place]* Visit Date: 09/22/2018 Occupation: _0 @  Subjective:  Right trochanteric bursitis   History of Present Illness: Katie Maldonado is a 68 y.o. female with history of osteoarthritis and DDD.  She continues to have chronic fatigue despite sleeping about 8 hours per night.  She is currently experiencing right trochanteric bursitis and right CMC joint pain.  She would like 2 cortisone injections today.  She has been using voltaren gel topically PRN for pain relief.  She has been experiencing increased neck pain but denies any symptoms of radiculopathy.     Activities of Daily Living:  Patient reports morning stiffness for 30  minutes.   Patient Reports nocturnal pain.  Difficulty dressing/grooming: Denies Difficulty climbing stairs: Reports Difficulty getting out of chair: Reports Difficulty using hands for taps, buttons, cutlery, and/or writing: Denies  Review of Systems  Constitutional: Positive for fatigue.  HENT: Positive for mouth sores. Negative for mouth dryness and nose dryness.   Eyes: Negative for pain, visual disturbance and dryness.  Respiratory: Negative for cough, hemoptysis, shortness of breath and difficulty breathing.   Cardiovascular: Negative for chest pain, palpitations, hypertension and swelling in legs/feet.  Gastrointestinal: Positive for constipation and diarrhea. Negative for blood in stool.  Endocrine: Negative for increased urination.  Genitourinary: Negative for painful urination.  Musculoskeletal: Positive for arthralgias, joint pain, myalgias, morning stiffness, muscle tenderness and myalgias. Negative for joint swelling and muscle weakness.  Skin: Negative for color change, pallor, rash, hair loss, nodules/bumps, skin tightness, ulcers and sensitivity to  sunlight.  Allergic/Immunologic: Negative for susceptible to infections.  Neurological: Negative for dizziness, numbness, headaches and weakness.  Hematological: Negative for swollen glands.  Psychiatric/Behavioral: Negative for depressed mood and sleep disturbance. The patient is not nervous/anxious.     PMFS History:  Patient Active Problem List   Diagnosis Date Noted  . Osteoarthritis of lumbar spine 10/22/2016  . Primary osteoarthritis of both feet 03/09/2016  . Primary osteoarthritis of both knees 03/09/2016  . Degenerative cervical disc 10/04/2014  . Primary osteoarthritis of both hands 08/19/2014  . Osteoporosis 11/13/2012  . Incontinence of urine 04/18/2011  . HYPERLIPIDEMIA-MIXED 01/08/2010  . MULTIPLE SCLEROSIS 01/08/2010    Past Medical History:  Diagnosis Date  . Allergy   . Anxiety   . Arthritis   . Diverticulitis   . Diverticulosis 10/2015  . Hypersomnolence   . Migraine   . Multiple sclerosis ([place])   . Neuromuscular disorder ([place])   . Obesity, unspecified   . Osteoporosis   . Other and unspecified hyperlipidemia   . TIA (transient ischemic attack) 22    Family History  Problem Relation Age of Onset  . Emphysema Father   . Cancer Brother        bone  . Cancer Maternal Grandmother   . Heart attack Brother   . Cancer Maternal Grandfather   . Cancer Paternal Grandmother   . Cancer Paternal Grandfather   . Irritable bowel syndrome Son    Past Surgical History:  Procedure Laterality Date  . ABDOMINAL HYSTERECTOMY     Fibroids  . arm surgery left    . FRACTURE SURGERY    . TUBAL LIGATION    .  WRIST SURGERY     Social History   Social History Narrative   Patient is married [name]), has 2 children   Patient is right handed   Education level is 2 yrs of college   Caffeine consumption is 3-4 cups daily   Immunization History  Administered Date(s) Administered  . Hepatitis B 12/11/2007  . Influenza Split 01/31/2011, 04/28/2012, 02/02/2014  .  Influenza Whole 12/27/2015  . MMR 05/16/1995, 12/10/2007  . Pneumococcal Conjugate-13 04/12/2015  . Pneumococcal Polysaccharide-23 09/16/2011, 10/09/2017  . Tdap 08/28/2015  . Zoster 11/17/2012     Objective: Vital Signs: BP 114/77 (BP Location: Left Arm, Patient Position: Sitting, Cuff Size: Small)   Pulse 80   Resp 12   Ht 5' 4.5" (1.638 m)   Wt 161 lb (73 kg)   BMI 27.21 kg/m    Physical Exam Vitals signs and nursing note reviewed.  Constitutional:      Appearance: She is well-developed.  HENT:     Head: Normocephalic and atraumatic.  Eyes:     Conjunctiva/sclera: Conjunctivae normal.  Neck:     Musculoskeletal: Normal range of motion.  Cardiovascular:     Rate and Rhythm: Normal rate and regular rhythm.     Heart sounds: Normal heart sounds.  Pulmonary:     Effort: Pulmonary effort is normal.     Breath sounds: Normal breath sounds.  Abdominal:     General: Bowel sounds are normal.     Palpations: Abdomen is soft.  Lymphadenopathy:     Cervical: No cervical adenopathy.  Skin:    General: Skin is warm and dry.     Capillary Refill: Capillary refill takes less than 2 seconds.  Neurological:     Mental Status: She is alert and oriented to person, place, and time.  Psychiatric:        Behavior: Behavior normal.      Musculoskeletal Exam: C-spine slightly limited range of motion with lateral rotation.  Thoracic and lumbar spine good range of motion.  Shoulder joints, with joints, wrist joints, MCPs, PIPs, DIPs good range of motion no synovitis.  She has complete fist formation bilaterally.  She is synovial thickening of bilateral CMC joints.  Hip joints, knee joints, ankle joints, MCPs, PIPs, DIPs good range of motion no synovitis.  No warmth or effusion bilateral knee joints.  No tenderness or swelling of ankle joints.  No tenderness over MTP joints.  She has mild PIP and DIP synovial thickening consistent with osteoarthritis of bilateral feet.  CDAI Exam: CDAI  Score: Not documented Patient Global Assessment: Not documented; Provider Global Assessment: Not documented Swollen: Not documented; Tender: Not documented Joint Exam   Not documented   There is currently no information documented on the homunculus. Go to the Rheumatology activity and complete the homunculus joint exam.  Investigation: No additional findings.  Imaging: No results found.  Recent Labs: Lab Results  Component Value Date   WBC 4.5 12/23/2017   HGB 13.0 12/23/2017   PLT 244 12/23/2017   NA 139 12/23/2017   K 4.5 12/23/2017   CL 99 12/23/2017   CO2 26 12/23/2017   GLUCOSE 79 12/23/2017   BUN 11 12/23/2017   CREATININE 0.75 12/23/2017   BILITOT 0.4 12/23/2017   ALKPHOS 77 12/23/2017   AST 20 12/23/2017   ALT 19 12/23/2017   PROT 7.0 12/23/2017   ALBUMIN 4.6 12/23/2017   CALCIUM 9.7 12/23/2017   GFRAA 97 12/23/2017    Speciality Comments: No specialty comments available.  Procedures:  Large Joint Inj: R greater trochanter on 09/22/2018 1:35 PM Indications: pain Details: 27 G 1.5 in needle, lateral approach  Arthrogram: No  Medications: 40 mg triamcinolone acetonide 40 MG/ML; 1.5 mL lidocaine 1 % Aspirate: 0 mL Outcome: tolerated well, no immediate complications Procedure, treatment alternatives, risks and benefits explained, specific risks discussed. Consent was given by the patient. Immediately prior to procedure a time out was called to verify the correct patient, procedure, equipment, support staff and site/side marked as required. Patient was prepped and draped in the usual sterile fashion.     Allergies: Nitrofurantoin; Sulfonamide derivatives; Betaseron [interferon beta-1b]; Cefdinir; and Copaxone [glatiramer acetate]   Assessment / Plan:     Visit Diagnoses: Primary osteoarthritis of both hands: She has PIP and DIP synovial thickening consistent with osteoarthritis of bilateral hands.  She has bilateral CMC joint synovial thickening.  She has  tenderness of the right CMC joint.  She experiences pain in her right [facility] joint and makes some ADLs difficult.  She would like a right CMC joint cortisone injection, which we will schedule under ultrasound guidance.  She was encouraged to use Voltaren gel topically PRN for pain relief as well.  She was advised to notify us if she develops increased joint pain or joint swelling.  She will follow up in 6 months.   Primary osteoarthritis of both knees: No warmth or effusion.  She has good range of motion.   Primary osteoarthritis of both feet: She has PIP and DIP synovial thickening consistent with osteoarthritis of bilateral feet.  She wears proper fitting shoes.   Trochanteric bursitis, right hip: She has tenderness over the right after different treatment options were discussed she was encouraged to perform stretching exercises. She requested a right trochanteric bursa cortisone injection today.  She tolerated the procedure well.  Aftercare discussed. Procedure note was completed above.   DDD (degenerative disc disease), cervical: She has limited ROM with lateral rotation.  She has trapezius muscle tension and tenderness.  She has no symptoms of radiculopathy at this time.   DDD (degenerative disc disease), lumbar: She has intermittent lower back pain.  She has no symptoms of radiculopathy.   Age-related osteoporosis without current pathological fracture: She takes a vitamin D supplement.   History of vitamin D deficiency: She takes a vitamin D supplement.   History of fatigue: Chronic but stable.   Other medical conditions are listed as follows:   History of anxiety  History of multiple sclerosis   Orders: Orders Placed This Encounter  Procedures  . Large Joint Inj   No orders of the defined types were placed in this encounter.   Face-to-face time spent with patient was 30 minutes. Greater than 50% of time was spent in counseling and coordination of care.  Follow-Up Instructions:  Return in about 6 months (around 03/25/2019) for Osteoarthritis, DDD.   Ofilia Neas, PA-C   I examined and evaluated the patient with Hazel Sams PA.  Patient had tenderness on palpation of her right trochanteric bursa on my exam.  After treatment options were discussed and side effects were reviewed the right trochanteric bursa was injected as described above.  She is been also having discomfort in her right CMC joint.  We will schedule ultrasound-guided injection.  The plan of care was discussed as noted above.  Bo Merino, MD  Note - This record has been created using Editor, commissioning.  Chart creation errors have been sought, but may not always  have been  located. Such creation errors do not reflect on  the standard of medical care.

## 2018-09-21 DIAGNOSIS — Z0000 Encounter for general adult medical examination without abnormal findings: Secondary | ICD-10-CM | POA: Diagnosis not present

## 2018-09-21 DIAGNOSIS — M129 Arthropathy, unspecified: Secondary | ICD-10-CM | POA: Diagnosis not present

## 2018-09-21 DIAGNOSIS — E559 Vitamin D deficiency, unspecified: Secondary | ICD-10-CM | POA: Diagnosis not present

## 2018-09-21 DIAGNOSIS — R0602 Shortness of breath: Secondary | ICD-10-CM | POA: Diagnosis not present

## 2018-09-22 ENCOUNTER — Encounter: Payer: Self-pay | Admitting: Rheumatology

## 2018-09-22 ENCOUNTER — Other Ambulatory Visit: Payer: Self-pay

## 2018-09-22 ENCOUNTER — Ambulatory Visit: Payer: 59 | Admitting: Rheumatology

## 2018-09-22 VITALS — BP 114/77 | HR 80 | Resp 12 | Ht 64.5 in | Wt 161.0 lb

## 2018-09-22 DIAGNOSIS — M19042 Primary osteoarthritis, left hand: Secondary | ICD-10-CM

## 2018-09-22 DIAGNOSIS — M19041 Primary osteoarthritis, right hand: Secondary | ICD-10-CM | POA: Diagnosis not present

## 2018-09-22 DIAGNOSIS — Z8669 Personal history of other diseases of the nervous system and sense organs: Secondary | ICD-10-CM

## 2018-09-22 DIAGNOSIS — M810 Age-related osteoporosis without current pathological fracture: Secondary | ICD-10-CM | POA: Diagnosis not present

## 2018-09-22 DIAGNOSIS — Z8639 Personal history of other endocrine, nutritional and metabolic disease: Secondary | ICD-10-CM | POA: Diagnosis not present

## 2018-09-22 DIAGNOSIS — M19072 Primary osteoarthritis, left ankle and foot: Secondary | ICD-10-CM

## 2018-09-22 DIAGNOSIS — M7061 Trochanteric bursitis, right hip: Secondary | ICD-10-CM

## 2018-09-22 DIAGNOSIS — M170 Bilateral primary osteoarthritis of knee: Secondary | ICD-10-CM

## 2018-09-22 DIAGNOSIS — M5030 Other cervical disc degeneration, unspecified cervical region: Secondary | ICD-10-CM

## 2018-09-22 DIAGNOSIS — Z87898 Personal history of other specified conditions: Secondary | ICD-10-CM | POA: Diagnosis not present

## 2018-09-22 DIAGNOSIS — M19071 Primary osteoarthritis, right ankle and foot: Secondary | ICD-10-CM

## 2018-09-22 DIAGNOSIS — M5136 Other intervertebral disc degeneration, lumbar region: Secondary | ICD-10-CM

## 2018-09-22 DIAGNOSIS — Z8659 Personal history of other mental and behavioral disorders: Secondary | ICD-10-CM

## 2018-09-22 DIAGNOSIS — G35 Multiple sclerosis: Secondary | ICD-10-CM

## 2018-09-22 MED ORDER — LIDOCAINE HCL 1 % IJ SOLN
1.5000 mL | INTRAMUSCULAR | Status: AC | PRN
  Administered 2018-09-22: 1.5 mL

## 2018-09-22 MED ORDER — TRIAMCINOLONE ACETONIDE 40 MG/ML IJ SUSP
40.0000 mg | INTRAMUSCULAR | Status: AC | PRN
  Administered 2018-09-22: 40 mg via INTRA_ARTICULAR

## 2018-09-22 MED FILL — IBUPROFEN 200 MG TABLET: 200 | 25 days supply | Qty: 100 | Fill #0

## 2018-09-22 MED FILL — ALENDRONATE NA 10 MG TAB: 10 | 84 days supply | Qty: 12 | Fill #0

## 2018-09-22 MED FILL — MYRBETRIQ ER 50 MG TABLET: 50 | 30 days supply | Qty: 30 | Fill #0

## 2018-09-24 ENCOUNTER — Telehealth: Payer: Self-pay | Admitting: *Deleted

## 2018-09-24 MED FILL — CITALOPRAM HBR 20 MG TABLET: 20 | 30 days supply | Qty: 30 | Fill #0

## 2018-09-24 NOTE — Telephone Encounter (Signed)
Received fax from [facility] re: tecfidera 240 mg caps therapy referral. Her order is scheduled to be delivered on 09/24/2018.  For questions call [phone], F [phone].

## 2018-09-28 DIAGNOSIS — R600 Localized edema: Secondary | ICD-10-CM | POA: Diagnosis not present

## 2018-09-28 DIAGNOSIS — M79606 Pain in leg, unspecified: Secondary | ICD-10-CM | POA: Diagnosis not present

## 2018-10-12 DIAGNOSIS — R600 Localized edema: Secondary | ICD-10-CM | POA: Diagnosis not present

## 2018-10-12 DIAGNOSIS — E782 Mixed hyperlipidemia: Secondary | ICD-10-CM | POA: Diagnosis not present

## 2018-10-12 DIAGNOSIS — M810 Age-related osteoporosis without current pathological fracture: Secondary | ICD-10-CM | POA: Diagnosis not present

## 2018-10-12 DIAGNOSIS — R238 Other skin changes: Secondary | ICD-10-CM | POA: Diagnosis not present

## 2018-10-21 ENCOUNTER — Ambulatory Visit: Payer: 59 | Admitting: Rheumatology

## 2018-10-28 ENCOUNTER — Other Ambulatory Visit: Payer: Self-pay | Admitting: Diagnostic Neuroimaging

## 2018-10-28 ENCOUNTER — Telehealth: Payer: Self-pay | Admitting: *Deleted

## 2018-10-28 MED ORDER — TECFIDERA 240 MG PO CPDR: 240.0000 mg | DELAYED_RELEASE_CAPSULE | Freq: Two times a day (BID) | ORAL | 1 refills | Status: DC

## 2018-10-28 MED FILL — MYRBETRIQ ER 50 MG TABLET: 50 | 30 days supply | Qty: 30 | Fill #1

## 2018-10-28 MED FILL — ONDANSETRON HCL 8 MG TABLET: 8 | 30 days supply | Qty: 30 | Fill #0

## 2018-10-28 MED FILL — CITALOPRAM HBR 20 MG TABLET: 20 | 30 days supply | Qty: 30 | Fill #1

## 2018-10-28 NOTE — Telephone Encounter (Signed)
Received my chart asking for refill on Tecfidera. Refill e scribed to [facility] per previous refill. Patient informed via my chart reply.

## 2018-10-29 ENCOUNTER — Other Ambulatory Visit: Payer: Self-pay | Admitting: Diagnostic Neuroimaging

## 2018-10-29 MED FILL — ADDERALL XR 20 MG CAP SA: 20 | 30 days supply | Qty: 30 | Fill #0

## 2018-11-10 MED FILL — ADDERALL XR 20 MG CAP SA: 20 | 30 days supply | Qty: 30 | Fill #0

## 2018-11-30 MED FILL — OMEPRAZOLE 20 MG CAP: 20 | 90 days supply | Qty: 90 | Fill #2

## 2018-11-30 MED FILL — CITALOPRAM HBR 20 MG TABLET: 20 | 30 days supply | Qty: 30 | Fill #2

## 2018-12-09 ENCOUNTER — Other Ambulatory Visit: Payer: Self-pay | Admitting: Diagnostic Neuroimaging

## 2018-12-09 MED FILL — ADDERALL XR 20 MG CAP SA: 20 | 30 days supply | Qty: 30 | Fill #0

## 2018-12-09 MED FILL — ONDANSETRON HCL 8 MG TABLET: 8 | 30 days supply | Qty: 30 | Fill #0

## 2018-12-22 DIAGNOSIS — G35 Multiple sclerosis: Secondary | ICD-10-CM | POA: Diagnosis not present

## 2018-12-22 DIAGNOSIS — N3000 Acute cystitis without hematuria: Secondary | ICD-10-CM | POA: Diagnosis not present

## 2018-12-22 DIAGNOSIS — R300 Dysuria: Secondary | ICD-10-CM | POA: Diagnosis not present

## 2018-12-22 DIAGNOSIS — E782 Mixed hyperlipidemia: Secondary | ICD-10-CM | POA: Diagnosis not present

## 2018-12-22 DIAGNOSIS — R51 Headache: Secondary | ICD-10-CM | POA: Diagnosis not present

## 2018-12-23 MED FILL — AMOX-CLAV 875-125 MG TABLET: 875-125 | 7 days supply | Qty: 14 | Fill #0

## 2019-01-08 MED FILL — CITALOPRAM HBR 20 MG TABLET: 20 | 30 days supply | Qty: 30 | Fill #0

## 2019-01-11 DIAGNOSIS — E782 Mixed hyperlipidemia: Secondary | ICD-10-CM | POA: Diagnosis not present

## 2019-01-11 DIAGNOSIS — G35 Multiple sclerosis: Secondary | ICD-10-CM | POA: Diagnosis not present

## 2019-01-11 DIAGNOSIS — G43909 Migraine, unspecified, not intractable, without status migrainosus: Secondary | ICD-10-CM | POA: Diagnosis not present

## 2019-01-11 DIAGNOSIS — R61 Generalized hyperhidrosis: Secondary | ICD-10-CM | POA: Diagnosis not present

## 2019-01-12 ENCOUNTER — Telehealth: Payer: Self-pay | Admitting: *Deleted

## 2019-01-12 NOTE — Telephone Encounter (Signed)
Attempted to reach patient to ask if she would agree to see NP next week. No answer or VM on mobile # to leave a message.

## 2019-01-12 NOTE — Telephone Encounter (Signed)
Pt returned call and is wanting to know if it is for the same date and time or when were you looking at. Pt states she has a VM and it is ok to leave a VM on her mobile.

## 2019-01-12 NOTE — Telephone Encounter (Signed)
LVM informing patient that NP doesn't have opening the same time/day as her current apt with Dr Leta Baptist. I informed her I have blocked FU with NP on Thurs Sept 24th , 8 :30 am. I advised her to let the phone staff know if that is okay with her; the time can be unblocked and her FU scheduled.

## 2019-01-13 NOTE — Telephone Encounter (Signed)
Patient called back and rescheduled with NP.

## 2019-01-19 ENCOUNTER — Ambulatory Visit: Payer: Self-pay | Admitting: Diagnostic Neuroimaging

## 2019-01-21 ENCOUNTER — Other Ambulatory Visit: Payer: Self-pay

## 2019-01-21 ENCOUNTER — Encounter: Payer: Self-pay | Admitting: Family Medicine

## 2019-01-21 ENCOUNTER — Ambulatory Visit: Payer: 59 | Admitting: Family Medicine

## 2019-01-21 VITALS — BP 117/77 | HR 76 | Temp 97.1°F | Ht 64.5 in | Wt 154.6 lb

## 2019-01-21 DIAGNOSIS — R41840 Attention and concentration deficit: Secondary | ICD-10-CM | POA: Diagnosis not present

## 2019-01-21 DIAGNOSIS — R5383 Other fatigue: Secondary | ICD-10-CM

## 2019-01-21 DIAGNOSIS — N3281 Overactive bladder: Secondary | ICD-10-CM | POA: Diagnosis not present

## 2019-01-21 DIAGNOSIS — G35 Multiple sclerosis: Secondary | ICD-10-CM

## 2019-01-21 DIAGNOSIS — G35D Multiple sclerosis, unspecified: Secondary | ICD-10-CM

## 2019-01-21 MED ORDER — DIMETHYL FUMARATE 240 MG PO CPDR: 240.0000 mg | DELAYED_RELEASE_CAPSULE | Freq: Two times a day (BID) | ORAL | 1 refills | Status: DC

## 2019-01-21 MED ORDER — AMPHETAMINE-DEXTROAMPHET ER 20 MG PO CP24: 20.0000 mg | ORAL_CAPSULE | Freq: Every day | ORAL | 0 refills | Status: DC

## 2019-01-21 MED FILL — ADDERALL XR 20 MG CAP SA: 20 | 30 days supply | Qty: 30 | Fill #0

## 2019-01-21 NOTE — Patient Instructions (Signed)
Continue current therapy  Work on adding exercise  Congratulations on retirement!  Follow up in 6 months  Multiple Sclerosis Multiple sclerosis (MS) is a disease of the brain, spinal cord, and optic nerves (central nervous system). It causes the body's disease-fighting (immune) system to destroy the protective covering (myelin sheath) around nerves in the brain. When this happens, signals (nerve impulses) going to and from the brain and spinal cord do not get sent properly or may not get sent at all. There are several types of MS:  Relapsing-remitting MS. This is the most common type. This causes sudden attacks of symptoms. After an attack, you may recover completely until the next attack, or some symptoms may remain permanently.  Secondary progressive MS. This usually develops after the onset of relapsing-remitting MS. Similar to relapsing-remitting MS, this type also causes sudden attacks of symptoms. Attacks may be less frequent, but symptoms slowly get worse (progress) over time.  Primary progressive MS. This causes symptoms that steadily progress over time. This type of MS does not cause sudden attacks of symptoms. The age of onset of MS varies, but it often develops between 41-60 years of age. MS is a lifelong (chronic) condition. There is no cure, but treatment can help slow down the progression of the disease. What are the causes? The cause of this condition is not known. What increases the risk? You are more likely to develop this condition if:  You are a woman.  You have a relative with MS. However, the condition is not passed from parent to child (inherited).  You have a lack (deficiency) of vitamin D.  You smoke. MS is more common in the [place] than in the [place]. What are the signs or symptoms? Relapsing-remitting and secondary progressive MS cause symptoms to occur in episodes or attacks that may last weeks to months. There may be long  periods between attacks in which there are almost no symptoms. Primary progressive MS causes symptoms to steadily progress after they develop. Symptoms of MS vary because of the many different ways it affects the central nervous system. The main symptoms include:  Vision problems and eye pain.  Numbness.  Weakness.  Inability to move your arms, hands, feet, or legs (paralysis).  Balance problems.  Shaking that you cannot control (tremors).  Muscle spasms.  Problems with thinking (cognitive changes). MS can also cause symptoms that are associated with the disease, but are not always the direct result of an MS attack. They may include:  Inability to control urination or bowel movements (incontinence).  Headaches.  Fatigue.  Inability to tolerate heat.  Emotional changes.  Depression.  Pain. How is this diagnosed? This condition is diagnosed based on:  Your symptoms.  A neurological exam. This involves checking central nervous system function, such as nerve function, reflexes, and coordination.  MRIs of the brain and spinal cord.  Lab tests, including a lumbar puncture that tests the fluid that surrounds the brain and spinal cord (cerebrospinal fluid).  Tests to measure the electrical activity of the brain in response to stimulation (evoked potentials). How is this treated? There is no cure for MS, but medicines can help decrease the number and frequency of attacks and help relieve nuisance symptoms. Treatment options may include:  Medicines that reduce the frequency of attacks. These medicines may be given by injection, by mouth (orally), or through an IV.  Medicines that reduce inflammation (steroids). These may provide short-term relief of symptoms.  Medicines to help  control pain, depression, fatigue, or incontinence.  Vitamin D, if you have a deficiency.  Using devices to help you move around (assistive devices), such as braces, a cane, or a walker.   Physical therapy to strengthen and stretch your muscles.  Occupational therapy to help you with everyday tasks.  Alternative or complementary treatments such as exercise, massage, or acupuncture. Follow these instructions at home:  Take over-the-counter and prescription medicines only as told by your health care provider.  Do not drive or use heavy machinery while taking prescription pain medicine.  Use assistive devices as recommended by your physical therapist or your health care provider.  Exercise as directed by your health care provider.  Return to your normal activities as told by your health care provider. Ask your health care provider what activities are safe for you.  Reach out for support. Share your feelings with friends, family, or a support group.  Keep all follow-up visits as told by your health care provider and therapists. This is important. Where to find more information  National Multiple Sclerosis Society: https://www.nationalmssociety.org Contact a health care provider if:  You feel depressed.  You develop new pain or numbness.  You have tremors.  You have problems with sexual function. Get help right away if:  You develop paralysis.  You develop numbness.  You have problems with your bladder or bowel function.  You develop double vision.  You lose vision in one or both eyes.  You develop suicidal thoughts.  You develop severe confusion. If you ever feel like you may hurt yourself or others, or have thoughts about taking your own life, get help right away. You can go to your nearest emergency department or call:  Your local emergency services (911 in the [place]  A suicide crisis helpline, such as the [facility] at 716-081-7847. This is open 24 hours a day. Summary  Multiple sclerosis (MS) is a disease of the central nervous system that causes the body's immune system to destroy the protective covering (myelin  sheath) around nerves in the brain.  There are 3 types of MS: relapsing-remitting, secondary progressive, and primary progressive. Relapsing-remitting and secondary progressive MS cause symptoms to occur in episodes or attacks that may last weeks to months. Primary progressive MS causes symptoms to steadily progress after they develop.  There is no cure for MS, but medicines can help decrease the number and frequency of attacks and help relieve nuisance symptoms. Treatment may also include physical or occupational therapy.  If you develop numbness, paralysis, vision problems, or other neurological symptoms, get help right away. This information is not intended to replace advice given to you by your health care provider. Make sure you discuss any questions you have with your health care provider. Document Released: 04/12/2000 Document Revised: 03/28/2017 Document Reviewed: 06/24/2016 Elsevier Patient Education  2020 [employer].

## 2019-01-21 NOTE — Progress Notes (Signed)
PATIENT: Katie Maldonado DOB: February 05, 1951  REASON FOR VISIT: follow up HISTORY FROM: patient  Chief Complaint  Patient presents with   Follow-up    Room 5, alone. Ms f/u. " Lower back pain. fatigue. Pain in the middle of the head"     HISTORY OF PRESENT ILLNESS: Today 01/21/19 Katie Maldonado is a 68 y.o. female here today for follow up for MS.She continues Tecfidera and is tolerating well. She does have some flusing but feels it is manageable. No falls since last being seen. She continues to have some vertigo symptoms and fatigue. Adderall helps with fatigue. She reports a sharp stabbing pain that occurs in the middle of her head. Pain lasts for about an hour and relieved with ibuprofen. Pain occurs about 10 times a month. She takes 600mg  of ibuprofen every 4 hours for generalized pain.  She continues Myrbetriq 50 mg daily for overactive bladder.  She is retiring from [employer] with [employer] next week. She is planning to start exercising with [employer].  She recently had blood work with primary care for her physical exam.  I have reviewed results and will scanned to chart.  No new numbness, weakness, changes to gait or bladder/bowel function.  MRI stable in 12/2017.  HISTORY: (copied from Dr Gladstone Lighter note on 08/17/2018)  UPDATE (08/17/18, VRP): Since last visit, having more UTI, constipation, and falls. Tolerating tecfidera. No alleviating or aggravating factors.   UPDATE (12/23/17, VRP): Since last visit, doing well. Symptoms are stable, except for new onset of vertigo symptoms a few weeks ago. Severity is mild. No alleviating or aggravating factors. Tolerating meds.    UPDATE (06/18/17, VRP): Since last visit, doing about the same. Tolerating meds. No alleviating or aggravating factors.   UPDATE 12/16/16: Since last visit, doing about the same. Still with fatigue. Tolerating tecfidera. Denies depression. Sleep is stable.   UPDATE 06/18/16: Since last visit,  doing better. Better energy and concentration. PT helping with back pain.   UPDATE 03/06/16: Since last visit, continues with fatigue, poor attention, difficulty at work. She is in corrective action due to errors. Denies depression, but has excessive daytime fatigue.   UPDATE 08/31/15: Since last visit continues with fatigue, pain, numbness. Tolerating tecfidera. Has tried PT. Urinary issues better. Now recovering from right arm celluitis (was painting, developed blister, then got infected; now better on oral antibiotics).  UPDATE 07/07/15: Since last visit, still with left neck pain radiating to the left arm. Numb/tingling. Has h/o left forearm/wrist fracture (2005 and 2010; s/p surgery). Amantadine not helping with fatigue. Denies depression.   UPDATE 05/31/15: Since last visit, sxs progressing since Oct 2016. More fatigue, more bladder/bowel issues. More left arm numbness.  UPDATE 11/28/14: Since last visit, continues to have prickly, tingling sensation in whole body, hot flash sensations, pain, neck issues. Had 2 falls, but no injuries.  UPDATE 08/23/14: Since last visit, has been on tecfidera since beginning March 2016. She has noted more general numbness, fatigue, constipation, drawstring sensation in calves/hamstrings in past 3 weeks.   UPDATE 05/18/14: MS symptoms stable. Has had 2 reactions to copaxone, leading to tinging, prickly sensations over whole body. 1 event led to throat tightness, tongue feeling thick, and worried patient. Here to discuss possibilities of switching therapies.  UPDATE 01/04/14 (VRP): Since last visit, doing the same. No new neuro symptoms. Does report persistent fatigue, weight gain, incr appetite, feeling hot. Tolerating copaxone. Misses 2-3 injections per month.   UPDATE 06/30/13 (LL):  Since  last visit patient continues to have problems with fatigue and malaise, numbness and paresthesias in left arm which is new for her, started a couple weeks ago.  She states it is  constant, does not hurt, but annoying.  She is still able to work.  Follow up MRI brain at last visit showed no acute or new lesions, with moderate cerebral atrophy.  UPDATE 12/30/12 (VP): Since last visit patient continues to have problems with fatigue, insomnia, anxiety, shortness of breath. For past 6 months she's been having dyspnea on exertion especially climbing steps at her work. She has been evaluated by PCP without specific diagnosis. Patient continues on Copaxone (now 3 times per week dosing). Last MRI brain was in 2011. Regarding fatigue she has been on Provigil and Ritalin in the past without relief. Regarding anxiety she was on Paxil in the past but this caused nausea and stomach problems.   PRIOR HPI (05/02/12, Dr. 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 Her MRI of brain 06/10/1999 showed white matter abnormalities not typical for MS and MRI of the cervical spine without contrast 07/27/99 showed a focal area of abnormality in the spinal cord at C2-3. She had positive CSF IgG index and oligoclonal banding. Initially the VER was normal with subsequent VERs 12/04/1999, 12/10/1999, and 02/01/2000 abnormal in the right eye. She began Avonex which she took for one year and switched to Rebif beginning 08/2000. She was then switched to Betaseron 04/2001 because her insurance would not pay for Rebif, but was subsequently switched back to Rebif 04/2003. Because of elevated liver function tests she was switched to Copaxone 07/03/2005.She has felt better on Copaxone then on the interferons. Her visual acuity varies from 20/30 to 20/40 in the left eye and to 20/200 in the right. She had burning paresthesias treated with gabapentin and at times amitriptyline. NMO blood test was negative 02/21/2005. She has been followed Dr. Estanislado Pandy, rheumatologist for the question of RA versus sarcoidosis and by  Dr.Clinton Young for hypersomnolence without a diagnosis of narcolepsy or obstructive Sleep apnea. MRI studies of the brain and cervical spine 02/08/2010 showed periventricular lesions and unchanged lesion at C2-3. CBC and CMP 03/17/2009 were normal. CBC, CMP, TSH, lipid profile except LDL 151 were normal 08/07/09.vitamin D level 10/16/2009 was 41. She complains of fatigue and her right foot turning in an occasional foot cramping. She fell 10/2009 fracturing her right foot in a [employer] parking lot. In 2010 she fractured her left foot, left wrist, and her left arm. She has continued swelling in her right foot and lower leg. She has an overactive bladder followed by Dr.Dalhstedt. She has a history of Lhermitte's sign worse with neck extension but also present with flexion. She had numbness on the right side of her body except her face and head for one month.Her bladder symptoms improved on enablex.She walks 1.8 miles 3 times per week. 03/05/2011 she was lying in bed asleep and awoke trying to sit up and developed dizziness on 2 different occasions. She fell back in to the bed and went to sleep. On the third episode she became concerned. The episodes of dizziness would last seconds and are described as spinning. She had dizziness during the day without spinning She did not have head trauma or a cold. She was seen by Dr. Elder Cyphers at urgent care and underwent blood studies, EKG, and urinalysis. She has a history of migraine  and a history of motion sickness. She has a Lhermitte's sign when she turns her head to the left with discomfort in her neck going into her left arm. This can occur 2 times per day and lasts Less than 1 minute. She has bladder symptoms and right flank pain and is concerned about another urinary tract infection. She can awaken with snoring when she has a cold. She sleeps well. She has dizziness 2 or 3 times per week when she lies down and turns her head to the left.   REVIEW OF SYSTEMS: Out of a complete  14 system review of symptoms, the patient complains only of the following symptoms, dizziness, headache, numbness, weakness and all other reviewed systems are negative.  ALLERGIES: Allergies  Allergen Reactions   Nitrofurantoin Shortness Of Breath    Shortness of breath, violent chills, dizziness, nausea   Sulfonamide Derivatives Shortness Of Breath    Shortness of breath, chills   Betaseron [Interferon Beta-1b]     Shortness of breath, rash, heart palpatations   Cefdinir     Can't remember what rx had 2 yrs ago   Copaxone [Glatiramer Acetate]     HOME MEDICATIONS: Outpatient Medications Prior to Visit  Medication Sig Dispense Refill   alendronate (FOSAMAX) 10 MG tablet Take 1 tablet (10 mg total) by mouth daily before breakfast. Take with a full glass of water on an empty stomach. Avoid lying down x 30 min. 90 tablet 3   Cholecalciferol (VITAMIN D3) 5000 units CAPS Take by mouth daily.     diclofenac sodium (VOLTAREN) 1 % GEL Apply 3 grams to 3 large joints up to 3 times daily 3 Tube 3   ibuprofen (ADVIL,MOTRIN) 200 MG tablet Take 2 tablets (400 mg total) by mouth 4 (four) times daily.     Mirabegron (MYRBETRIQ PO) Take 50 mg by mouth daily.      Omega-3 Fatty Acids (FISH OIL) 1000 MG CAPS Take by mouth daily.     omeprazole (PRILOSEC) 20 MG capsule TAKE 1 CAPSULE BY MOUTH ONCE DAILY 90 capsule 3   ondansetron (ZOFRAN-ODT) 4 MG disintegrating tablet DISSOLVE 1 TABLET BY MOUTH EVERY 8 HOURS AS NEEDED FOR NAUSEA AND VOMITING 60 tablet 0   ADDERALL XR 20 MG 24 hr capsule TAKE 1 CAPSULE BY MOUTH ONCE A DAY 30 capsule 0   Dimethyl Fumarate (TECFIDERA) 240 MG CPDR Take 1 capsule (240 mg total) by mouth 2 (two) times daily. 180 capsule 1   Facility-Administered Medications Prior to Visit  Medication Dose Route Frequency Provider Last Rate Last Dose   meclizine (ANTIVERT) tablet 25 mg  25 mg Oral Q8H PRN Benjamine Mola, FNP        PAST MEDICAL HISTORY: Past Medical  History:  Diagnosis Date   Allergy    Anxiety    Arthritis    Diverticulitis    Diverticulosis 10/2015   Hypersomnolence    Migraine    Multiple sclerosis (HCC)    Neuromuscular disorder (HCC)    Obesity, unspecified    Osteoporosis    Other and unspecified hyperlipidemia    TIA (transient ischemic attack) 20    PAST SURGICAL HISTORY: Past Surgical History:  Procedure Laterality Date   ABDOMINAL HYSTERECTOMY     Fibroids   arm surgery left     FRACTURE SURGERY     TUBAL LIGATION     WRIST SURGERY      FAMILY HISTORY: Family History  Problem Relation Age of Onset   Emphysema  Father    Cancer Brother        bone   Cancer Maternal Grandmother    Heart attack Brother    Cancer Maternal Grandfather    Cancer Paternal Grandmother    Cancer Paternal Grandfather    Irritable bowel syndrome Son     SOCIAL HISTORY: Social History   Socioeconomic History   Marital status: Married    Spouse name: [name] "JC"   Number of children: 2   Years of education: college   Highest education level: Not on file  Occupational History   Occupation: medical records    Employer: [employer]   Occupation: [employer]/HEALTH INFORMATION    Employer: [employer] resource strain: Not on file   Food insecurity    Worry: Not on file    Inability: Not on file   Transportation needs    Medical: Not on file    Non-medical: Not on file  Tobacco Use   Smoking status: Former Smoker    Packs/day: 2.00    Years: 37.00    Pack years: 74.00    Types: Cigarettes    Quit date: 02/26/2004    Years since quitting: 14.9   Smokeless tobacco: Never Used  Substance and Sexual Activity   Alcohol use: Yes    Comment: occasionally    Drug use: No   Sexual activity: Yes    Birth control/protection: None  Lifestyle   Physical activity    Days per week: 3 days    Minutes per session: 30 min   Stress: Not at all  Relationships     Social connections    Talks on phone: More than three times a week    Gets together: More than three times a week    Attends religious service: 1 to 4 times per year    Active member of club or organization: Yes    Attends meetings of clubs or organizations: More than 4 times per year    Relationship status: Married   Intimate partner violence    Fear of current or ex partner: No    Emotionally abused: No    Physically abused: No    Forced sexual activity: No  Other Topics Concern   Not on file  Social History Narrative   Patient is married [name]), has 2 children   Patient is right handed   Education level is 2 yrs of college   Caffeine consumption is 3-4 cups daily      PHYSICAL EXAM  Vitals:   01/21/19 0824  BP: 117/77  Pulse: 76  Temp: (!) 97.1 F (36.2 C)  Weight: 154 lb 9.6 oz (70.1 kg)  Height: 5' 4.5" (1.638 m)   Body mass index is 26.13 kg/m.  Generalized: Well developed, in no acute distress  Cardiology: normal rate and rhythm, no murmur noted Neurological examination  Mentation: Alert oriented to time, place, history taking. Follows all commands speech and language fluent Cranial nerve II-XII: Pupils were equal round reactive to light. Extraocular movements were full, visual field were full on confrontational test. Facial sensation and strength were normal. Uvula tongue midline. Head turning and shoulder shrug  were normal and symmetric. Motor: The motor testing reveals 5 over 5 strength of all 4 extremities. Good symmetric motor tone is noted throughout.  Sensory: Sensory testing is intact to soft touch on all 4 extremities. No evidence of extinction is noted.  Coordination: Cerebellar testing reveals good finger-nose-finger and heel-to-shin bilaterally.  Gait and station: Gait is normal.  DIAGNOSTIC DATA (LABS, IMAGING, TESTING) - I reviewed patient records, labs, notes, testing and imaging myself where available.  No flowsheet data found.   Lab  Results  Component Value Date   WBC 4.5 12/23/2017   HGB 13.0 12/23/2017   HCT 41.2 12/23/2017   MCV 86 12/23/2017   PLT 244 12/23/2017      Component Value Date/Time   NA 139 12/23/2017 1149   K 4.5 12/23/2017 1149   CL 99 12/23/2017 1149   CO2 26 12/23/2017 1149   GLUCOSE 79 12/23/2017 1149   GLUCOSE 108 (H) 03/12/2016 1440   BUN 11 12/23/2017 1149   CREATININE 0.75 12/23/2017 1149   CREATININE 0.75 03/12/2016 1440   CALCIUM 9.7 12/23/2017 1149   PROT 7.0 12/23/2017 1149   ALBUMIN 4.6 12/23/2017 1149   AST 20 12/23/2017 1149   ALT 19 12/23/2017 1149   ALKPHOS 77 12/23/2017 1149   BILITOT 0.4 12/23/2017 1149   GFRNONAA 84 12/23/2017 1149   GFRNONAA 85 03/12/2016 1440   GFRAA 97 12/23/2017 1149   GFRAA >89 03/12/2016 1440   Lab Results  Component Value Date   CHOL 229 (H) 06/05/2017   HDL 71 06/05/2017   LDLCALC 144 (H) 06/05/2017   LDLDIRECT 120 (H) 03/19/2013   TRIG 69 06/05/2017   CHOLHDL 3.2 06/05/2017   No results found for: HGBA1C No results found for: VITAMINB12 Lab Results  Component Value Date   TSH 3.780 09/30/2017     ASSESSMENT AND PLAN 68 y.o. year old female  has a past medical history of Allergy, Anxiety, Arthritis, Diverticulitis, Diverticulosis (10/2015), Hypersomnolence, Migraine, Multiple sclerosis ([place]), Neuromuscular disorder ([place]), Obesity, unspecified, Osteoporosis, Other and unspecified hyperlipidemia, and TIA (transient ischemic attack) (20). here with     ICD-10-CM   1. Multiple sclerosis (HCC)  G35 Dimethyl Fumarate (TECFIDERA) 240 MG CPDR  2. Other fatigue  R53.83 amphetamine-dextroamphetamine (ADDERALL XR) 20 MG 24 hr capsule  3. Attention and concentration deficit  R41.840 amphetamine-dextroamphetamine (ADDERALL XR) 20 MG 24 hr capsule  4. Overactive bladder  N32.81     Mrs. Merlini is doing fairly well on Tecfidera.  We will continue current therapy.  She was advised to take medication with food.  CBC with differential and CMP  reviewed in office today.  We will scanned labs to chart.  We have discussed possible treatment options for concerns of headaches.  She is retiring in 1 week.  She plans to start an exercise regimen.  She would like to continue monitoring headaches for now and consider treatment if needed in the future.  She was advised to call with any new or worsening symptoms.  We will continue Adderall as prescribed for fatigue and decreased concentration.  She will continue Myrbetriq as prescribed by PCP for overactive bladder.  Fall precautions given.  She is followed by PCP for osteoporosis on Fosamax.  She will follow-up with me in 6 months, sooner if needed.  She verbalizes understanding and agreement with this plan.   No orders of the defined types were placed in this encounter.    Meds ordered this encounter  Medications   Dimethyl Fumarate (TECFIDERA) 240 MG CPDR    Sig: Take 1 capsule (240 mg total) by mouth 2 (two) times daily.    Dispense:  180 capsule    Refill:  1    Order Specific Question:   Supervising Provider    Answer:   Melvenia Beam [id number]  amphetamine-dextroamphetamine (ADDERALL XR) 20 MG 24 hr capsule    Sig: Take 1 capsule (20 mg total) by mouth daily.    Dispense:  30 capsule    Refill:  0    Order Specific Question:   Supervising Provider    Answer:   Melvenia Beam [id number]      I spent 20 minutes with the patient. 50% of this time was spent counseling and educating patient on plan of care and medications.    Debbora Presto, FNP-C 01/21/2019, 12:20 PM [facility] [address] [place], [place] [address] [phone]

## 2019-02-03 DIAGNOSIS — N3946 Mixed incontinence: Secondary | ICD-10-CM | POA: Diagnosis not present

## 2019-02-03 DIAGNOSIS — R3121 Asymptomatic microscopic hematuria: Secondary | ICD-10-CM | POA: Diagnosis not present

## 2019-02-03 DIAGNOSIS — N3020 Other chronic cystitis without hematuria: Secondary | ICD-10-CM | POA: Diagnosis not present

## 2019-02-03 MED FILL — MYRBETRIQ ER 50 MG TABLET: 50 | 30 days supply | Qty: 30 | Fill #0

## 2019-02-04 MED FILL — CITALOPRAM HBR 20 MG TABLET: 20 | 30 days supply | Qty: 30 | Fill #1

## 2019-02-15 ENCOUNTER — Telehealth: Payer: Self-pay | Admitting: *Deleted

## 2019-02-15 NOTE — Telephone Encounter (Signed)
Received fax from [facility] re: Tecfidera needs PA due to generic available.  Previous PA was to be good through 06/25/2019. Called patient's plan, spoke with Caryl Pina, answered clinical questions. Dx: G35. Tried/failed : Rebif- elevated liver function, Avonex, copaxone - throat tightness, tingling/prickling sensations over body. Started brand Tecfidera March 2016. Symptoms and MRI brain stable since 5 /2016.  Caryl Pina stated decision turn around 24-72 hours, will receive fax.

## 2019-02-18 NOTE — Telephone Encounter (Addendum)
Received denial of brand name Tecfidera with [facility]. Patient must have experienced adverse effect or allergic reaction to at least two generic Tecfidera products. Also dr must state brand-name med is medically necessary and pharmacy must process Rx with this information. I spoke with patient and advised her of denial. We discussed possible side effects, and she stated she is fine with taking generic if Dr Leta Baptist agrees. I advised her will discuss and call her pharmacy. She then stated her insurance will change next month. I advised she call our office and give new insurance information. Patient verbalized understanding, appreciation.

## 2019-02-18 NOTE — Telephone Encounter (Signed)
Ok to switch to generic. -VRP

## 2019-02-22 ENCOUNTER — Encounter: Payer: Self-pay | Admitting: *Deleted

## 2019-02-22 DIAGNOSIS — G35 Multiple sclerosis: Secondary | ICD-10-CM | POA: Diagnosis not present

## 2019-02-22 DIAGNOSIS — Z79899 Other long term (current) drug therapy: Secondary | ICD-10-CM | POA: Diagnosis not present

## 2019-02-22 DIAGNOSIS — G43909 Migraine, unspecified, not intractable, without status migrainosus: Secondary | ICD-10-CM | POA: Diagnosis not present

## 2019-02-22 DIAGNOSIS — Z1159 Encounter for screening for other viral diseases: Secondary | ICD-10-CM | POA: Diagnosis not present

## 2019-02-22 DIAGNOSIS — G459 Transient cerebral ischemic attack, unspecified: Secondary | ICD-10-CM | POA: Diagnosis not present

## 2019-02-22 DIAGNOSIS — Z7189 Other specified counseling: Secondary | ICD-10-CM | POA: Diagnosis not present

## 2019-02-22 DIAGNOSIS — R8271 Bacteriuria: Secondary | ICD-10-CM | POA: Diagnosis not present

## 2019-02-22 DIAGNOSIS — R61 Generalized hyperhidrosis: Secondary | ICD-10-CM | POA: Diagnosis not present

## 2019-02-22 DIAGNOSIS — E7800 Pure hypercholesterolemia, unspecified: Secondary | ICD-10-CM | POA: Diagnosis not present

## 2019-02-22 NOTE — Telephone Encounter (Signed)
Called [facility] pharmacy spoke with pharmacist, Caren Griffins, and informed her Tecfidera brand wasn't approved. Dr Leta Baptist stated patient may take generic,  Dimethyl fumurate and the patient agreed. . I gave pharmacist the Rx based on recent refill by NP. She stated for further questions I can call her direct line, [phone]. She verbalized understanding, appreciation. Sent patient my chart to make her aware.

## 2019-02-22 NOTE — Progress Notes (Signed)
I reviewed note and agree with plan.   Penni Bombard, MD [id number], [id number] AM Certified in Neurology, Neurophysiology and Neuroimaging  [facility] [address] [place], [place] [address] [phone]

## 2019-03-15 ENCOUNTER — Encounter: Payer: Self-pay | Admitting: *Deleted

## 2019-03-16 NOTE — Progress Notes (Deleted)
Office Visit Note  Patient: Katie Maldonado             Date of Birth: 1950-09-24           MRN: [id number]             PCP: [facility] [place] Referring: [facility], [place] Visit Date: 03/30/2019 Occupation: _0 @  Subjective:  No chief complaint on file.   History of Present Illness: Katie Maldonado is a 68 y.o. female ***   Activities of Daily Living:  Patient reports morning stiffness for *** {minute/hour:19697}.   Patient {ACTIONS;DENIES/REPORTS:21021675::"Denies"} nocturnal pain.  Difficulty dressing/grooming: {ACTIONS;DENIES/REPORTS:21021675::"Denies"} Difficulty climbing stairs: {ACTIONS;DENIES/REPORTS:21021675::"Denies"} Difficulty getting out of chair: {ACTIONS;DENIES/REPORTS:21021675::"Denies"} Difficulty using hands for taps, buttons, cutlery, and/or writing: {ACTIONS;DENIES/REPORTS:21021675::"Denies"}  No Rheumatology ROS completed.   PMFS History:  Patient Active Problem List   Diagnosis Date Noted  . Osteoarthritis of lumbar spine 10/22/2016  . Primary osteoarthritis of both feet 03/09/2016  . Primary osteoarthritis of both knees 03/09/2016  . Degenerative cervical disc 10/04/2014  . Primary osteoarthritis of both hands 08/19/2014  . Osteoporosis 11/13/2012  . Incontinence of urine 04/18/2011  . HYPERLIPIDEMIA-MIXED 01/08/2010  . MULTIPLE SCLEROSIS 01/08/2010    Past Medical History:  Diagnosis Date  . Allergy   . Anxiety   . Arthritis   . Diverticulitis   . Diverticulosis 10/2015  . Hypersomnolence   . Migraine   . Multiple sclerosis ([place])   . Neuromuscular disorder ([place])   . Obesity, unspecified   . Osteoporosis   . Other and unspecified hyperlipidemia   . TIA (transient ischemic attack) 20    Family History  Problem Relation Age of Onset  . Emphysema Father   . Cancer Brother        bone  . Cancer Maternal Grandmother   . Heart attack Brother   . Cancer Maternal Grandfather   . Cancer Paternal Grandmother    . Cancer Paternal Grandfather   . Irritable bowel syndrome Son    Past Surgical History:  Procedure Laterality Date  . ABDOMINAL HYSTERECTOMY     Fibroids  . arm surgery left    . FRACTURE SURGERY    . TUBAL LIGATION    . WRIST SURGERY     Social History   Social History Narrative   Patient is married [name]), has 2 children   Patient is right handed   Education level is 2 yrs of college   Caffeine consumption is 3-4 cups daily   Immunization History  Administered Date(s) Administered  . Hepatitis B 12/11/2007  . Influenza Split 01/31/2011, 04/28/2012, 02/02/2014  . Influenza Whole 12/27/2015  . MMR 05/16/1995, 12/10/2007  . Pneumococcal Conjugate-13 04/12/2015  . Pneumococcal Polysaccharide-23 09/16/2011, 10/09/2017  . Tdap 08/28/2015  . Zoster 11/17/2012     Objective: Vital Signs: There were no vitals taken for this visit.   Physical Exam   Musculoskeletal Exam: ***  CDAI Exam: CDAI Score: - Patient Global: -; Provider Global: - Swollen: -; Tender: - Joint Exam   No joint exam has been documented for this visit   There is currently no information documented on the homunculus. Go to the Rheumatology activity and complete the homunculus joint exam.  Investigation: No additional findings.  Imaging: No results found.  Recent Labs: Lab Results  Component Value Date   WBC 4.5 12/23/2017   HGB 13.0 12/23/2017   PLT 244 12/23/2017   NA 139 12/23/2017   K 4.5 12/23/2017   CL 99 12/23/2017  CO2 26 12/23/2017   GLUCOSE 79 12/23/2017   BUN 11 12/23/2017   CREATININE 0.75 12/23/2017   BILITOT 0.4 12/23/2017   ALKPHOS 77 12/23/2017   AST 20 12/23/2017   ALT 19 12/23/2017   PROT 7.0 12/23/2017   ALBUMIN 4.6 12/23/2017   CALCIUM 9.7 12/23/2017   GFRAA 97 12/23/2017    Speciality Comments: No specialty comments available.  Procedures:  No procedures performed Allergies: Nitrofurantoin, Sulfonamide derivatives, Betaseron [interferon beta-1b],  Cefdinir, and Copaxone [glatiramer acetate]   Assessment / Plan:     Visit Diagnoses: No diagnosis found.  Orders: No orders of the defined types were placed in this encounter.  No orders of the defined types were placed in this encounter.   Face-to-face time spent with patient was *** minutes. Greater than 50% of time was spent in counseling and coordination of care.  Follow-Up Instructions: No follow-ups on file.   Earnestine Mealing, CMA  Note - This record has been created using Editor, commissioning.  Chart creation errors have been sought, but may not always  have been located. Such creation errors do not reflect on  the standard of medical care.

## 2019-03-17 ENCOUNTER — Telehealth: Payer: Self-pay | Admitting: *Deleted

## 2019-03-17 NOTE — Telephone Encounter (Signed)
Called patient and asked her about fax received from [place] asking for Rx on Tecfidera 240 mg. She stated that as of 02/28/19 she is on [facility] and got a call telling her that [facility] doesn't approve generic tecfidera. Illene Bolus is on their formulary.  Rx on Dr [employer] desk for completion, signature. Tecfidera Rx completed, signed and faxed to [facility], [phone].

## 2019-03-29 MED FILL — CITALOPRAM HBR 20 MG TABLET: 20 | 30 days supply | Qty: 30 | Fill #2

## 2019-03-29 MED FILL — OMEPRAZOLE 20 MG CAP: 20 | 30 days supply | Qty: 30 | Fill #0

## 2019-03-29 MED FILL — ONDANSETRON HCL 8 MG TABLET: 8 | 30 days supply | Qty: 30 | Fill #0

## 2019-03-30 ENCOUNTER — Ambulatory Visit: Payer: 59 | Admitting: Rheumatology

## 2019-04-07 ENCOUNTER — Telehealth: Payer: Self-pay

## 2019-04-07 NOTE — Telephone Encounter (Signed)
Paper PA done for generic Tecfideria DR 240mg . PA fax to  [phone] with last two office notes attach. Fax confirmed.

## 2019-04-12 NOTE — Telephone Encounter (Signed)
Received a approval for Tecfidera 240 mg capsules. Approved through 04/28/2020. A copy of the approval letter has been faxed to the patient's pharmacy. Confirmation fax has been received.

## 2019-04-14 ENCOUNTER — Encounter: Payer: Self-pay | Admitting: Family Medicine

## 2019-04-14 ENCOUNTER — Ambulatory Visit (INDEPENDENT_AMBULATORY_CARE_PROVIDER_SITE_OTHER): Payer: Medicare HMO | Admitting: Family Medicine

## 2019-04-14 ENCOUNTER — Other Ambulatory Visit: Payer: Self-pay

## 2019-04-14 VITALS — BP 120/75 | HR 66 | Temp 97.3°F | Ht 64.5 in | Wt 162.6 lb

## 2019-04-14 DIAGNOSIS — G35 Multiple sclerosis: Secondary | ICD-10-CM

## 2019-04-14 DIAGNOSIS — G4486 Cervicogenic headache: Secondary | ICD-10-CM

## 2019-04-14 DIAGNOSIS — G44221 Chronic tension-type headache, intractable: Secondary | ICD-10-CM

## 2019-04-14 DIAGNOSIS — R519 Headache, unspecified: Secondary | ICD-10-CM | POA: Diagnosis not present

## 2019-04-14 MED ORDER — TIZANIDINE HCL 2 MG PO TABS: 2.0000 mg | ORAL_TABLET | Freq: Four times a day (QID) | ORAL | 0 refills | Status: AC | PRN

## 2019-04-14 MED ORDER — METHYLPREDNISOLONE 4 MG PO TBPK: ORAL_TABLET | ORAL | 0 refills | Status: DC

## 2019-04-14 MED FILL — METHYLPREDNISOLONE 4 MG TBP: 4 | 6 days supply | Qty: 21 | Fill #0

## 2019-04-14 MED FILL — MYRBETRIQ ER 50 MG TABLET: 50 | 30 days supply | Qty: 30 | Fill #1

## 2019-04-14 MED FILL — tiZANidine HCL 2 MG TABS: 2 | 7 days supply | Qty: 30 | Fill #0

## 2019-04-14 NOTE — Progress Notes (Signed)
PATIENT: Katie Maldonado DOB: [birth year]  REASON FOR VISIT: follow up HISTORY FROM: patient  Chief Complaint  Patient presents with  . Follow-up    3 mon f/u. Alone. Rm 1. Patient mentioned that she is having some weakness to her arms and legs. She stated that it hurts to hold her head up. She mentioned that her head from the middle all the way  down to her neck feel like cement.      HISTORY OF PRESENT ILLNESS: Today 04/14/19 Katie Maldonado is a 68 y.o. female here today for concerns of weakness. She reports that over the past 3-4 weeks headaches have worsened, she noticed waxing and waning weakness of both upper and lower extremities. Headache is described as a pressure/throbbing/pulsating sensation. Pain starts in base of head/neck and radiates to the middle of her head. They do not wrap across or to front of head. She does get nauseated, no vomiting. No light or sound sensitivity. She has had migraines in the past and reports that this feels different. No aura. She denies numbness or tingling. She is able to do all ADL's independently. She denies changes in bowel or bladder habits. No vision changes or changes in gait.   She retired January 28, 2019. She is enjoying retirement. She has been doing more projects around that house. No concerns of anxiety/depression.   She continues Tecfidera as prescribed. MRI brain stable in 12/2017. MRI cervical spine showed severe degeneration of C4-5, C5-6 and C6-7 in 2017. She is followed by Dr Estanislado Pandy, rheumatology for arthritis. PCP for osteoporosis. On Fosamax.   HISTORY: (copied from my note on 01/21/2019)  Katie Maldonado is a 68 y.o. female here today for follow up for MS.She continues Tecfidera and is tolerating well. She does have some flusing but feels it is manageable. No falls since last being seen. She continues to have some vertigo symptoms and fatigue. Adderall helps with fatigue. She reports a sharp stabbing pain that  occurs in the middle of her head. Pain lasts for about an hour and relieved with ibuprofen. Pain occurs about 10 times a month. She takes 600mg  of ibuprofen every 4 hours for generalized pain.  She continues Myrbetriq 50 mg daily for overactive bladder.  She is retiring from [employer] with [employer] next week. She is planning to start exercising with [employer].  She recently had blood work with primary care for her physical exam.  I have reviewed results and will scanned to chart.  No new numbness, weakness, changes to gait or bladder/bowel function.  MRI stable in 12/2017.  HISTORY: (copied from Dr Gladstone Lighter note on 08/17/2018)  UPDATE (08/17/18, VRP): Since last visit,having more UTI, constipation, and falls. Tolerating tecfidera.No alleviating or aggravating factors.  UPDATE (12/23/17, VRP): Since last visit, doingwell. Symptoms arestable, except for new onset of vertigo symptoms a few weeks ago.Severityis mild. No alleviating or aggravating factors. Toleratingmeds.  UPDATE (06/18/17, VRP): Since last visit, doing about the same. Tolerating meds. No alleviating or aggravating factors.   UPDATE 12/16/16: Since last visit, doing about the same. Still with fatigue. Tolerating tecfidera. Denies depression. Sleep is stable.   UPDATE 06/18/16: Since last visit, doing better. Better energy and concentration. PT helping with back pain.   UPDATE 03/06/16: Since last visit, continues with fatigue, poor attention, difficulty at work. She is in corrective action due to errors. Denies depression, but has excessive daytime fatigue.   UPDATE 08/31/15: Since last visit continues with fatigue, pain,  numbness. Tolerating tecfidera. Has tried PT. Urinary issues better. Now recovering from right arm celluitis (was painting, developed blister, then got infected; now better on oral antibiotics).  UPDATE 07/07/15: Since last visit, still with left neck pain radiating to the left arm.  Numb/tingling. Has h/o left forearm/wrist fracture (2005 and 2010; s/p surgery). Amantadine not helping with fatigue. Denies depression.   UPDATE 05/31/15: Since last visit, sxs progressing since Oct 2016. More fatigue, more bladder/bowel issues. More left arm numbness.  UPDATE 11/28/14: Since last visit, continues to have prickly, tingling sensation in whole body, hot flash sensations, pain, neck issues. Had 2 falls, but no injuries.  UPDATE 08/23/14: Since last visit, has been on tecfidera since beginning March 2016. She has noted more general numbness, fatigue, constipation, drawstring sensation in calves/hamstrings in past 3 weeks.   UPDATE 05/18/14: MS symptoms stable. Has had 2 reactions to copaxone, leading to tinging, prickly sensations over whole body. 1 event led to throat tightness, tongue feeling thick, and worried patient. Here to discuss possibilities of switching therapies.  UPDATE 01/04/14 (VRP): Since last visit, doing the same. No new neuro symptoms. Does report persistent fatigue, weight gain, incr appetite, feeling hot. Tolerating copaxone. Misses 2-3 injections per month.   UPDATE 06/30/13 (LL): Since last visit patient continues to have problems with fatigue and malaise, numbness and paresthesias in left arm which is new for her, started a couple weeks ago. She states it is constant, does not hurt, but annoying. She is still able to work. Follow up MRI brain at last visit showed no acute or new lesions, with moderate cerebral atrophy.  UPDATE 12/30/12 (VP): Since last visit patient continues to have problems with fatigue, insomnia, anxiety, shortness of breath. For past 6 months she's been having dyspnea on exertion especially climbing steps at her work. She has been evaluated by PCP without specific diagnosis. Patient continues on Copaxone (now 3 times per week dosing). Last MRI brain was in 2011. Regarding fatigue she has been on Provigil and Ritalin in the past without relief.  Regarding anxiety she was on Paxil in the past but this caused nausea and stomach problems.   PRIOR HPI (05/02/12, Dr. 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 Her MRI of brain 06/10/1999 showed white matter abnormalities not typical for MS and MRI of the cervical spine without contrast 07/27/99 showed a focal area of abnormality in the spinal cord at C2-3. She had positive CSF IgG index and oligoclonal banding. Initially the VER was normal with subsequent VERs 12/04/1999, 12/10/1999, and 02/01/2000 abnormal in the right eye. She began Avonex which she took for one year and switched to Rebif beginning 08/2000. She was then switched to Betaseron 04/2001 because her insurance would not pay for Rebif, but was subsequently switched back to Rebif 04/2003. Because of elevated liver function tests she was switched to Copaxone 07/03/2005.She has felt better on Copaxone then on the interferons. Her visual acuity varies from 20/30 to 20/40 in the left eye and to 20/200 in the right. She had burning paresthesias treated with gabapentin and at times amitriptyline. NMO blood test was negative 02/21/2005. She has been followed Dr. Estanislado Pandy, rheumatologist for the question of RA versus sarcoidosis and by Dr.Clinton Young for hypersomnolence without a diagnosis of narcolepsy or obstructive Sleep apnea. MRI studies of the brain and cervical spine 02/08/2010 showed periventricular lesions and unchanged lesion at C2-3. CBC and  CMP 03/17/2009 were normal. CBC, CMP, TSH, lipid profile except LDL 151 were normal 08/07/09.vitamin D level 10/16/2009 was 41. She complains of fatigue and her right foot turning in an occasional foot cramping. She fell 10/2009 fracturing her right foot in a [employer] parking lot. In 2010 she fractured her left foot, left wrist, and her left arm. She has continued swelling in her right foot and lower  leg. She has an overactive bladder followed by Dr.Dalhstedt. She has a history of Lhermitte's sign worse with neck extension but also present with flexion. She had numbness on the right side of her body except her face and head for one month.Her bladder symptoms improved on enablex.She walks 1.8 miles 3 times per week. 03/05/2011 she was lying in bed asleep and awoke trying to sit up and developed dizziness on 2 different occasions. She fell back in to the bed and went to sleep. On the third episode she became concerned. The episodes of dizziness would last seconds and are described as spinning. She had dizziness during the day without spinning She did not have head trauma or a cold. She was seen by Dr. Elder Cyphers at urgent care and underwent blood studies, EKG, and urinalysis. She has a history of migraine and a history of motion sickness. She has a Lhermitte's sign when she turns her head to the left with discomfort in her neck going into her left arm. This can occur 2 times per day and lasts Less than 1 minute. She has bladder symptoms and right flank pain and is concerned about another urinary tract infection. She can awaken with snoring when she has a cold. She sleeps well. She has dizziness 2 or 3 times per week when she lies down and turns her head to the left.    REVIEW OF SYSTEMS: Out of a complete 14 system review of symptoms, the patient complains only of the following symptoms, headaches, neck pain, weakness and all other reviewed systems are negative.  ALLERGIES: Allergies  Allergen Reactions  . Nitrofurantoin Shortness Of Breath    Shortness of breath, violent chills, dizziness, nausea  . Sulfonamide Derivatives Shortness Of Breath    Shortness of breath, chills  . Betaseron [Interferon Beta-1b]     Shortness of breath, rash, heart palpatations  . Cefdinir     Can't remember what rx had 2 yrs ago  . Copaxone [Glatiramer Acetate] Other (See Comments)    Throat tightness, tingling, prickling  sensations over body    HOME MEDICATIONS: Outpatient Medications Prior to Visit  Medication Sig Dispense Refill  . alendronate (FOSAMAX) 10 MG tablet Take 1 tablet (10 mg total) by mouth daily before breakfast. Take with a full glass of water on an empty stomach. Avoid lying down x 30 min. 90 tablet 3  . Cholecalciferol (VITAMIN D3) 5000 units CAPS Take by mouth daily.    . citalopram (CELEXA) 20 MG tablet Take 20 mg by mouth daily.    . diclofenac sodium (VOLTAREN) 1 % GEL Apply 3 grams to 3 large joints up to 3 times daily 3 Tube 3  . Dimethyl Fumarate (TECFIDERA) 240 MG CPDR Take 1 capsule (240 mg total) by mouth 2 (two) times daily. 180 capsule 1  . ibuprofen (ADVIL,MOTRIN) 200 MG tablet Take 2 tablets (400 mg total) by mouth 4 (four) times daily.    . Mirabegron (MYRBETRIQ PO) Take 50 mg by mouth daily.     . Omega-3 Fatty Acids (FISH OIL) 1000 MG CAPS Take by mouth  daily.    . omeprazole (PRILOSEC) 20 MG capsule TAKE 1 CAPSULE BY MOUTH ONCE DAILY 90 capsule 3  . ondansetron (ZOFRAN) 8 MG tablet Take 8 mg by mouth 2 (two) times daily.    Marland Kitchen amphetamine-dextroamphetamine (ADDERALL XR) 20 MG 24 hr capsule Take 1 capsule (20 mg total) by mouth daily. 30 capsule 0  . ondansetron (ZOFRAN-ODT) 4 MG disintegrating tablet DISSOLVE 1 TABLET BY MOUTH EVERY 8 HOURS AS NEEDED FOR NAUSEA AND VOMITING 60 tablet 0   Facility-Administered Medications Prior to Visit  Medication Dose Route Frequency Provider Last Rate Last Admin  . meclizine (ANTIVERT) tablet 25 mg  25 mg Oral Q8H PRN Withrow, Elyse Jarvis, FNP        PAST MEDICAL HISTORY: Past Medical History:  Diagnosis Date  . Allergy   . Anxiety   . Arthritis   . Diverticulitis   . Diverticulosis 10/2015  . Hypersomnolence   . Migraine   . Multiple sclerosis ([place])   . Neuromuscular disorder ([place])   . Obesity, unspecified   . Osteoporosis   . Other and unspecified hyperlipidemia   . TIA (transient ischemic attack) 20    PAST SURGICAL  HISTORY: Past Surgical History:  Procedure Laterality Date  . ABDOMINAL HYSTERECTOMY     Fibroids  . arm surgery left    . FRACTURE SURGERY    . TUBAL LIGATION    . WRIST SURGERY      FAMILY HISTORY: Family History  Problem Relation Age of Onset  . Emphysema Father   . Cancer Brother        bone  . Cancer Maternal Grandmother   . Heart attack Brother   . Cancer Maternal Grandfather   . Cancer Paternal Grandmother   . Cancer Paternal Grandfather   . Irritable bowel syndrome Son     SOCIAL HISTORY: Social History   Socioeconomic History  . Marital status: Married    Spouse name: Dyke Brackett"  . Number of children: 2  . Years of education: college  . Highest education level: Not on file  Occupational History  . Occupation: medical records    Employer: [employer]  . Occupation: [employer]/HEALTH INFORMATION    Employer: [employer]  Tobacco Use  . Smoking status: Former Smoker    Packs/day: 2.00    Years: 37.00    Pack years: 74.00    Types: Cigarettes    Quit date: 02/26/2004    Years since quitting: 15.1  . Smokeless tobacco: Never Used  Substance and Sexual Activity  . Alcohol use: Yes    Comment: occasionally   . Drug use: No  . Sexual activity: Yes    Birth control/protection: None  Other Topics Concern  . Not on file  Social History Narrative   Patient is married [name]), has 2 children   Patient is right handed   Education level is 2 yrs of college   Caffeine consumption is 3-4 cups daily   Social Determinants of Health   Financial Resource Strain:   . Difficulty of Paying Living Expenses: Not on file  Food Insecurity:   . Worried About Charity fundraiser in the Last Year: Not on file  . Ran Out of Food in the Last Year: Not on file  Transportation Needs:   . Lack of Transportation (Medical): Not on file  . Lack of Transportation (Non-Medical): Not on file  Physical Activity:   . Days of Exercise per Week: Not on file  . Minutes  of Exercise  per Session: Not on file  Stress:   . Feeling of Stress : Not on file  Social Connections:   . Frequency of Communication with Friends and Family: Not on file  . Frequency of Social Gatherings with Friends and Family: Not on file  . Attends Religious Services: Not on file  . Active Member of Clubs or Organizations: Not on file  . Attends Archivist Meetings: Not on file  . Marital Status: Not on file  Intimate Partner Violence:   . Fear of Current or Ex-Partner: Not on file  . Emotionally Abused: Not on file  . Physically Abused: Not on file  . Sexually Abused: Not on file      PHYSICAL EXAM  Vitals:   04/14/19 0956  BP: 120/75  Pulse: 66  Temp: (!) 97.3 F (36.3 C)  TempSrc: Oral  Weight: 162 lb 9.6 oz (73.8 kg)  Height: 5' 4.5" (1.638 m)   Body mass index is 27.48 kg/m.  Generalized: Well developed, in no acute distress  Neurological examination  Mentation: Alert oriented to time, place, history taking. Follows all commands speech and language fluent Cranial nerve II-XII: Pupils were equal round reactive to light. Extraocular movements were full, visual field were full on confrontational test. Facial sensation and strength were normal. Uvula tongue midline. Head turning and shoulder shrug  were normal and symmetric. Motor: The motor testing reveals 5 over 5 strength of all 4 extremities. Good symmetric motor tone is noted throughout. Patient reports pain with palpation of bilateral trapezius muscles. No nuchal rigidity.  Sensory: Sensory testing is intact to soft touch on all 4 extremities. No evidence of extinction is noted.  Coordination: Cerebellar testing reveals good finger-nose-finger and heel-to-shin bilaterally.  Gait and station: Gait is normal. Tandem gait is normal. Romberg is negative. No drift is seen.  Reflexes: Deep tendon reflexes are symmetric and normal bilaterally.   DIAGNOSTIC DATA (LABS, IMAGING, TESTING) - I reviewed patient records,  labs, notes, testing and imaging myself where available.  No flowsheet data found.   Lab Results  Component Value Date   WBC 4.5 12/23/2017   HGB 13.0 12/23/2017   HCT 41.2 12/23/2017   MCV 86 12/23/2017   PLT 244 12/23/2017      Component Value Date/Time   NA 139 12/23/2017 1149   K 4.5 12/23/2017 1149   CL 99 12/23/2017 1149   CO2 26 12/23/2017 1149   GLUCOSE 79 12/23/2017 1149   GLUCOSE 108 (H) 03/12/2016 1440   BUN 11 12/23/2017 1149   CREATININE 0.75 12/23/2017 1149   CREATININE 0.75 03/12/2016 1440   CALCIUM 9.7 12/23/2017 1149   PROT 7.0 12/23/2017 1149   ALBUMIN 4.6 12/23/2017 1149   AST 20 12/23/2017 1149   ALT 19 12/23/2017 1149   ALKPHOS 77 12/23/2017 1149   BILITOT 0.4 12/23/2017 1149   GFRNONAA 84 12/23/2017 1149   GFRNONAA 85 03/12/2016 1440   GFRAA 97 12/23/2017 1149   GFRAA >89 03/12/2016 1440   Lab Results  Component Value Date   CHOL 229 (H) 06/05/2017   HDL 71 06/05/2017   LDLCALC 144 (H) 06/05/2017   LDLDIRECT 120 (H) 03/19/2013   TRIG 69 06/05/2017   CHOLHDL 3.2 06/05/2017   No results found for: HGBA1C No results found for: VITAMINB12 Lab Results  Component Value Date   TSH 3.780 09/30/2017       ASSESSMENT AND PLAN 68 y.o. year old female  has a past medical history  of Allergy, Anxiety, Arthritis, Diverticulitis, Diverticulosis (10/2015), Hypersomnolence, Migraine, Multiple sclerosis ([place]), Neuromuscular disorder ([place]), Obesity, unspecified, Osteoporosis, Other and unspecified hyperlipidemia, and TIA (transient ischemic attack) (20). here with     ICD-10-CM   1. MULTIPLE SCLEROSIS  G35 MR CERVICAL SPINE W WO CONTRAST MS    MR BRAIN W WO CONTRAST MS  2. Chronic tension-type headache, intractable  G44.221 methylPREDNISolone (MEDROL DOSEPAK) 4 MG TBPK tablet    tiZANidine (ZANAFLEX) 2 MG tablet    MR CERVICAL SPINE W WO CONTRAST MS    MR BRAIN W WO CONTRAST MS  3. Cervicogenic headache  R51.9     Semiyah reports that she has had  waxing and waning weakness of bilateral upper and lower extremities with worsening of headaches over the past few weeks.  No new numbness, tingling, vision changes, gait changes or changes in bowel or bladder habits.  Symptoms seem to wax and wane.  Neuro exam is intact in the office.  I suspect that headaches are related to cervical degeneration noted on MRI in 2017.  We will start a Medrol Dosepak as directed.  She will use tizanidine as needed for neck tension.  Complementary therapies also discussed including heat, ice, relaxation or massage if this helps.  We will update MRI of brain and cervical spine to ensure no worsening of demyelination.  She will continue Tecfidera as prescribed.  I have advised that she continue close follow-up with primary care and rheumatology as well.  She will follow-up with me in 6 months, sooner if needed.  She verbalizes understanding and agreement with this plan.   Orders Placed This Encounter  Procedures  . MR CERVICAL SPINE W WO CONTRAST MS    Standing Status:   Future    Standing Expiration Date:   06/14/2020    Order Specific Question:   If indicated for the ordered procedure, I authorize the administration of contrast media per Radiology protocol    Answer:   Yes    Order Specific Question:   What is the patient's sedation requirement?    Answer:   No Sedation    Order Specific Question:   Does the patient have a pacemaker or implanted devices?    Answer:   No    Order Specific Question:   Radiology Contrast Protocol - do NOT remove file path    Answer:   \\[email or web address]    Order Specific Question:   Preferred imaging location?    Answer:   Internal  . MR BRAIN W WO CONTRAST MS    Standing Status:   Future    Standing Expiration Date:   06/14/2020    Order Specific Question:   If indicated for the ordered procedure, I authorize the administration of contrast media per Radiology protocol    Answer:   Yes    Order Specific  Question:   What is the patient's sedation requirement?    Answer:   No Sedation    Order Specific Question:   Does the patient have a pacemaker or implanted devices?    Answer:   No    Order Specific Question:   Radiology Contrast Protocol - do NOT remove file path    Answer:   \\[email or web address]    Order Specific Question:   Preferred imaging location?    Answer:   Internal     Meds ordered this encounter  Medications  . methylPREDNISolone (MEDROL DOSEPAK) 4 MG TBPK tablet    Sig: Taper pack as directed  Dispense:  1 each    Refill:  0    Order Specific Question:   Supervising Provider    Answer:   Melvenia Beam [id number]  . tiZANidine (ZANAFLEX) 2 MG tablet    Sig: Take 1 tablet (2 mg total) by mouth every 6 (six) hours as needed for muscle spasms.    Dispense:  30 tablet    Refill:  0    Order Specific Question:   Supervising Provider    Answer:   Bess Harvest, FNP-C 04/14/2019, 11:52 AM [facility] [address], [place], [place] [address] [phone]

## 2019-04-14 NOTE — Patient Instructions (Signed)
We will start prednisone taper as directed. Tizanidine for relaxation of neck muscles.   We will continue MS treatment plan. I will update MRI brain and cervical spine   Follow up in 6 months, sooner if needed   Cervicogenic Headache  A cervicogenic headache is a headache caused by a condition that affects the bones and tissues in your neck (cervical spine). In a cervicogenic headache, the pain moves from your neck to your head. Most cervicogenic headaches start in the upper part of the neck with the first three cervical bones (cervical vertebrae). A cervicogenic headache is diagnosed when a cause can be found in the cervical spine and other causes of headaches can be ruled out. What are the causes? The most common cause of this condition is a traumatic injury to the cervical spine, such as whiplash. Other causes include:  Arthritis.  Broken bone (fracture).  Infection.  Tumor. What are the signs or symptoms? The most common symptoms are neck and head pain. The pain is often located on one side. In some cases, there may be head pain without neck pain. Pain may be felt in the neck, back or side of the head, face, or behind the eyes. Other symptoms include:  Limited movement in the neck.  Arm or shoulder pain. How is this diagnosed? This condition may be diagnosed based on:  Your symptoms.  A physical exam.  An injection that blocks nerve signals (nerve block).  Imaging tests, such as: ? X-rays. ? CT scan. ? MRI. How is this treated? Treatment for this condition may depend on the underlying condition. Treatment may include:  Medicines, such as: ? NSAIDs. ? Muscle relaxants.  Physical therapy.  Massage therapy.  Complementary therapies, such as: ? Biofeedback. ? Meditation. ? Acupuncture.  Nerve block injections.  Botulinum toxin injections. Your treatment plan may involve working with a pain management team that includes your primary health care provider, a  pain management specialist, a neurologist, and a physical therapist. Follow these instructions at home:  Take over-the-counter and prescription medicines only as told by your health care provider.  Do exercises at home as told by your physical therapist.  Return to your normal activities as told by your health care provider. Ask your health care provider what activities are safe for you. Avoid activities that trigger your headaches.  Maintain good neck support and posture at home and at work.  Keep all follow-up visits as told by your health care provider. This is important. Contact a health care provider if you have:  Headaches that are getting worse and happening more often.  Headaches with any of the following: ? Fever. ? Numbness. ? Weakness. ? Dizziness. ? Nausea or vomiting. Get help right away if:  You have a very sudden and severe headache. Summary  A cervicogenic headache is a headache caused by a condition that affects the bones and tissues in your cervical spine.  Your health care provider may diagnose this condition with a physical exam, a nerve block, and imaging tests.  Treatment may include medicine to reduce pain and inflammation, physical therapy, and nerve block injections.  Complementary therapies, such as acupuncture and meditation, may be added to other treatments.  Your treatment plan may involve working with a pain management team that includes your primary health care provider, a pain management specialist, a neurologist, and a physical therapist. This information is not intended to replace advice given to you by your health care provider. Make sure you discuss any  questions you have with your health care provider. Document Released: 07/06/2003 Document Revised: 08/05/2018 Document Reviewed: 04/25/2017 Elsevier Patient Education  2020 [employer].

## 2019-04-15 NOTE — Progress Notes (Signed)
I reviewed note and agree with plan.   Penni Bombard, MD [id number], [id number] PM Certified in Neurology, Neurophysiology and Neuroimaging  [facility] [address] [employer] [place], [place] [address] [phone]

## 2019-04-29 MED FILL — CITALOPRAM HBR 20 MG TABLET: 20 | 30 days supply | Qty: 30 | Fill #0

## 2019-05-17 MED FILL — OMEPRAZOLE 20 MG CAP: 20 | 30 days supply | Qty: 30 | Fill #1

## 2019-06-02 MED FILL — ONDANSETRON HCL 8 MG TABLET: 8 | 30 days supply | Qty: 30 | Fill #1

## 2019-06-02 MED FILL — CITALOPRAM HBR 20 MG TABLET: 20 | 30 days supply | Qty: 30 | Fill #1

## 2019-06-07 ENCOUNTER — Telehealth: Payer: Self-pay | Admitting: Family Medicine

## 2019-06-07 NOTE — Telephone Encounter (Signed)
Patient called in and stated that she would like to know if the COVID Vaccine is a live Virus and if its ok for her to take

## 2019-06-07 NOTE — Telephone Encounter (Signed)
I called pt and relayed thru VM that covid vacc is not LIVE, she is ok to take this per AL/NP recommendations.  She is to call back if questions.

## 2019-06-07 NOTE — Telephone Encounter (Signed)
This is not live virus.  Ok to take?

## 2019-06-07 NOTE — Telephone Encounter (Signed)
You are correct. The vaccine does not contain a live virus. I do recommend her getting vaccinated. She can also talk with PCP and health department for additional information.

## 2019-06-29 ENCOUNTER — Other Ambulatory Visit: Payer: Self-pay | Admitting: Cardiology

## 2019-06-29 DIAGNOSIS — Z1231 Encounter for screening mammogram for malignant neoplasm of breast: Secondary | ICD-10-CM

## 2019-06-29 MED FILL — OMEPRAZOLE 20 MG CAP: 20 | 30 days supply | Qty: 30 | Fill #2

## 2019-07-09 MED FILL — CITALOPRAM HBR 20 MG TABLET: 20 | 30 days supply | Qty: 30 | Fill #2

## 2019-07-26 ENCOUNTER — Other Ambulatory Visit: Payer: Self-pay | Admitting: Cardiology

## 2019-07-26 DIAGNOSIS — M8580 Other specified disorders of bone density and structure, unspecified site: Secondary | ICD-10-CM

## 2019-07-27 MED FILL — OMEPRAZOLE 20 MG CAP: 20 | 30 days supply | Qty: 30 | Fill #3

## 2019-08-11 MED FILL — CITALOPRAM HBR 20 MG TABLET: 20 | 30 days supply | Qty: 30 | Fill #0

## 2019-08-17 ENCOUNTER — Telehealth: Payer: Self-pay | Admitting: *Deleted

## 2019-08-17 NOTE — Telephone Encounter (Signed)
Pt is coming to the office tomorrow to sign the vumerity start form. See mychart message for reference. Vumerity start form placed at the front desk for signature.

## 2019-08-17 NOTE — Telephone Encounter (Signed)
Received my chart asking about new MS medication (dimethyl fumarate or Vumerity)  when Tecfidera no longer has PA in May. I advised she call insurance to discuss coverage. She messaged stating neither dimethyl fumarate nor Vumerity are covered by [facility]. She gave provider's # [id number] to authorize either one. She asked for help with co pays.  I replied to her message and gave her several web sites for med assistance for MS drugs. I asked her which drug she wants Dr Leta Baptist to prescribe.

## 2019-08-17 NOTE — Telephone Encounter (Signed)
Yes ok to switch. -VRP

## 2019-08-17 NOTE — Telephone Encounter (Signed)
Received my chart: patient would like to try Vumerirty. She stated [employer] told her they can approve assistance for Vumerity.

## 2019-08-17 NOTE — Telephone Encounter (Signed)
I sent a mychart message to pt regarding the start form for vumerity. I asked if she is ok to come to the office to sign this form.

## 2019-08-18 NOTE — Telephone Encounter (Signed)
Patient came in and signed Vumerity start form. Form placed on MD's desk for completion, signature.

## 2019-08-18 NOTE — Telephone Encounter (Signed)
Vumerity start form completed, signed and faxed to [place].

## 2019-08-19 ENCOUNTER — Telehealth: Payer: Self-pay | Admitting: *Deleted

## 2019-08-19 ENCOUNTER — Encounter: Payer: Self-pay | Admitting: *Deleted

## 2019-08-19 NOTE — Telephone Encounter (Addendum)
Received fax from [place]: received Vumerity referral form. Sent patient my chart advising her to call [employer] and request temporary emergency fill of Tecfidera until Vumerity is approved. Received fax stating [place] will be [place], p [phone].

## 2019-08-19 NOTE — Telephone Encounter (Signed)
Per my chart message from patient she only has a couple days Tecfidera left. Spoke with Dr Leta Baptist who gave verbal Rx for Tecfidera 240 mg, take one twice daily, dispense #60, no refills. This will bridge patient until Vumerity is approved.  Per [place] Case manager w./[employer], I called [facility] [employer] free drug pharmacy and spoke with Gay Filler. I gave Gay Filler MD's verbal Rx. Gay Filler repeated correctly, verbalized understanding, appreciation.  Patient informed via my chart.

## 2019-08-23 ENCOUNTER — Telehealth: Payer: Self-pay | Admitting: *Deleted

## 2019-08-23 NOTE — Telephone Encounter (Signed)
Vumerity PA on CMM, key: [id number]. Failed Copaxone, betaseron, Rebif and Avonex with side effects.  Your information has been submitted to [facility]. [employer] will review the request and will issue a decision, typically within 3-7 days from your submission. You can check the updated outcome later by reopening this request.  If [employer] has not responded in 3-7 days or if you have any questions about your ePA request, please contact [employer] at [phone].

## 2019-08-23 NOTE — Telephone Encounter (Signed)
Received on 08/20/19  from [place] Patterson/[employer]: Just transferred the patient to [place] to schedule her shipment of Tecfidera!

## 2019-08-24 ENCOUNTER — Encounter: Payer: Self-pay | Admitting: *Deleted

## 2019-08-24 NOTE — Telephone Encounter (Signed)
PA for Vumerity with letter to answer questions faxed to [place] review.

## 2019-08-24 NOTE — Telephone Encounter (Addendum)
Per CMM: PA Case: [id number], Status: Approved, Coverage Starts on: 04/30/2019 12:00:00 AM, Coverage Ends on: 04/28/2020 12:00:00 AM. Questions? Contact [phone]. Will fax approval letter to [place] when received.

## 2019-08-24 NOTE — Telephone Encounter (Signed)
Received fax from [facility] requesting additional information for Vumerity PA, and PA Request form. Additional information provided in letter. PA Form on MD desk for signature.

## 2019-08-24 NOTE — Telephone Encounter (Signed)
Received Vumerity approval letter and faxed to [place].

## 2019-08-30 ENCOUNTER — Encounter: Payer: Self-pay | Admitting: *Deleted

## 2019-08-30 NOTE — Telephone Encounter (Signed)
Received notice of shipment of Vumerity from [employer] program . Dispensing pharmacy Contact phone [phone], f [phone]. Notified patient via my chart.

## 2019-08-31 MED FILL — SM OMEPRAZOLE 20 MG TBEC: 20 | 28 days supply | Qty: 28 | Fill #0

## 2019-09-17 MED FILL — ONDANSETRON HCL 8 MG TABLET: 8 | 30 days supply | Qty: 30 | Fill #0

## 2019-09-17 MED FILL — CITALOPRAM HBR 20 MG TABLET: 20 | 30 days supply | Qty: 30 | Fill #1

## 2019-09-20 ENCOUNTER — Other Ambulatory Visit: Payer: Self-pay | Admitting: Cardiology

## 2019-09-20 DIAGNOSIS — M810 Age-related osteoporosis without current pathological fracture: Secondary | ICD-10-CM

## 2019-09-22 ENCOUNTER — Encounter: Payer: Self-pay | Admitting: Family Medicine

## 2019-09-22 ENCOUNTER — Other Ambulatory Visit: Payer: Self-pay

## 2019-09-22 ENCOUNTER — Ambulatory Visit
Admission: RE | Admit: 2019-09-22 | Discharge: 2019-09-22 | Disposition: A | Discharge status: Home / Self Care | Payer: Medicare HMO | Source: Ambulatory Visit | Attending: Cardiology | Admitting: Cardiology

## 2019-09-22 ENCOUNTER — Ambulatory Visit: Payer: Medicare HMO | Admitting: Family Medicine

## 2019-09-22 VITALS — BP 144/71 | HR 61 | Ht 64.0 in | Wt 164.0 lb

## 2019-09-22 DIAGNOSIS — M810 Age-related osteoporosis without current pathological fracture: Secondary | ICD-10-CM

## 2019-09-22 DIAGNOSIS — G35 Multiple sclerosis: Secondary | ICD-10-CM | POA: Diagnosis not present

## 2019-09-22 DIAGNOSIS — Z1231 Encounter for screening mammogram for malignant neoplasm of breast: Secondary | ICD-10-CM

## 2019-09-22 NOTE — Patient Instructions (Signed)
We will update labs today. Consider updating MRI. Switch to Vumerity next week as directed. Call me with any new or worsening symptoms.   Stay active. Well balanced diet and adequate hydration.   Follow up in 6 months    Multiple Sclerosis Multiple sclerosis (MS) is a disease of the brain, spinal cord, and optic nerves (central nervous system). It causes the body's disease-fighting (immune) system to destroy the protective covering (myelin sheath) around nerves in the brain. When this happens, signals (nerve impulses) going to and from the brain and spinal cord do not get sent properly or may not get sent at all. There are several types of MS:  Relapsing-remitting MS. This is the most common type. This causes sudden attacks of symptoms. After an attack, you may recover completely until the next attack, or some symptoms may remain permanently.  Secondary progressive MS. This usually develops after the onset of relapsing-remitting MS. Similar to relapsing-remitting MS, this type also causes sudden attacks of symptoms. Attacks may be less frequent, but symptoms slowly get worse (progress) over time.  Primary progressive MS. This causes symptoms that steadily progress over time. This type of MS does not cause sudden attacks of symptoms. The age of onset of MS varies, but it often develops between 90-24 years of age. MS is a lifelong (chronic) condition. There is no cure, but treatment can help slow down the progression of the disease. What are the causes? The cause of this condition is not known. What increases the risk? You are more likely to develop this condition if:  You are a woman.  You have a relative with MS. However, the condition is not passed from parent to child (inherited).  You have a lack (deficiency) of vitamin D.  You smoke. MS is more common in the [place] than in the [place]. What are the signs or symptoms? Relapsing-remitting and  secondary progressive MS cause symptoms to occur in episodes or attacks that may last weeks to months. There may be long periods between attacks in which there are almost no symptoms. Primary progressive MS causes symptoms to steadily progress after they develop. Symptoms of MS vary because of the many different ways it affects the central nervous system. The main symptoms include:  Vision problems and eye pain.  Numbness.  Weakness.  Inability to move your arms, hands, feet, or legs (paralysis).  Balance problems.  Shaking that you cannot control (tremors).  Muscle spasms.  Problems with thinking (cognitive changes). MS can also cause symptoms that are associated with the disease, but are not always the direct result of an MS attack. They may include:  Inability to control urination or bowel movements (incontinence).  Headaches.  Fatigue.  Inability to tolerate heat.  Emotional changes.  Depression.  Pain. How is this diagnosed? This condition is diagnosed based on:  Your symptoms.  A neurological exam. This involves checking central nervous system function, such as nerve function, reflexes, and coordination.  MRIs of the brain and spinal cord.  Lab tests, including a lumbar puncture that tests the fluid that surrounds the brain and spinal cord (cerebrospinal fluid).  Tests to measure the electrical activity of the brain in response to stimulation (evoked potentials). How is this treated? There is no cure for MS, but medicines can help decrease the number and frequency of attacks and help relieve nuisance symptoms. Treatment options may include:  Medicines that reduce the frequency of attacks. These medicines may be given by injection,  by mouth (orally), or through an IV.  Medicines that reduce inflammation (steroids). These may provide short-term relief of symptoms.  Medicines to help control pain, depression, fatigue, or incontinence.  Vitamin D, if you have a  deficiency.  Using devices to help you move around (assistive devices), such as braces, a cane, or a walker.  Physical therapy to strengthen and stretch your muscles.  Occupational therapy to help you with everyday tasks.  Alternative or complementary treatments such as exercise, massage, or acupuncture. Follow these instructions at home:  Take over-the-counter and prescription medicines only as told by your health care provider.  Do not drive or use heavy machinery while taking prescription pain medicine.  Use assistive devices as recommended by your physical therapist or your health care provider.  Exercise as directed by your health care provider.  Return to your normal activities as told by your health care provider. Ask your health care provider what activities are safe for you.  Reach out for support. Share your feelings with friends, family, or a support group.  Keep all follow-up visits as told by your health care provider and therapists. This is important. Where to find more information  National Multiple Sclerosis Society: https://www.nationalmssociety.org Contact a health care provider if:  You feel depressed.  You develop new pain or numbness.  You have tremors.  You have problems with sexual function. Get help right away if:  You develop paralysis.  You develop numbness.  You have problems with your bladder or bowel function.  You develop double vision.  You lose vision in one or both eyes.  You develop suicidal thoughts.  You develop severe confusion. If you ever feel like you may hurt yourself or others, or have thoughts about taking your own life, get help right away. You can go to your nearest emergency department or call:  Your local emergency services (911 in the [place]  A suicide crisis helpline, such as the [facility] at 608-015-2220. This is open 24 hours a day. Summary  Multiple sclerosis (MS) is a disease of  the central nervous system that causes the body's immune system to destroy the protective covering (myelin sheath) around nerves in the brain.  There are 3 types of MS: relapsing-remitting, secondary progressive, and primary progressive. Relapsing-remitting and secondary progressive MS cause symptoms to occur in episodes or attacks that may last weeks to months. Primary progressive MS causes symptoms to steadily progress after they develop.  There is no cure for MS, but medicines can help decrease the number and frequency of attacks and help relieve nuisance symptoms. Treatment may also include physical or occupational therapy.  If you develop numbness, paralysis, vision problems, or other neurological symptoms, get help right away. This information is not intended to replace advice given to you by your health care provider. Make sure you discuss any questions you have with your health care provider. Document Revised: 03/28/2017 Document Reviewed: 06/24/2016 Elsevier Patient Education  2020 [employer].

## 2019-09-22 NOTE — Progress Notes (Signed)
PATIENT: Katie Maldonado DOB: 1950-09-11  REASON FOR VISIT: follow up HISTORY FROM: patient  Chief Complaint  Patient presents with  . Follow-up    rm 1 here for a f/u on MS. Pt is nthving any new sx     HISTORY OF PRESENT ILLNESS: Today 09/22/19 Katie Maldonado is a 69 y.o. female here today for follow up for RRMS. She was prescribed Vumerity as insurance no longer covering Tecfidera. She has about 10 days of Tecfidera left to take then will switch to Vumerity.  She feels that she is doing ok. She continues to have headaches about 8-10 times a week. She feels that heating pad and ibuprofen help more than anything. She does not like to take medications as she "feels loopy". She has used tizanidine and it will help but makes her sleepy.   She does have lower back and left hip pain thought to be from osteoarthritis. Bone density performed this morning shows osteoporosis of left hip. No longer on Fosamax.  She has follow up with rheumatology and PCP to discuss plans for treatment. She can not tolerate calcium but does continue vitamin D supplements. She did not update MRI. She does not feel it is needed at this time. Last MRI stable in 2019.   She denies any new or worsening symptoms. No gait changes, vision, bowel or bladder changes. She is traveling more now that she is retired. She is hiking regularly. She is getting ready to go to [place] for 5 weeks.    HISTORY: (copied from my note on 04/14/2019)  Katie Maldonado is a 69 y.o. female here today for concerns of weakness. She reports that over the past 3-4 weeks headaches have worsened, she noticed waxing and waning weakness of both upper and lower extremities. Headache is described as a pressure/throbbing/pulsating sensation. Pain starts in base of head/neck and radiates to the middle of her head. They do not wrap across or to front of head. She does get nauseated, no vomiting. No light or sound sensitivity. She has  had migraines in the past and reports that this feels different. No aura. She denies numbness or tingling. She is able to do all ADL's independently. She denies changes in bowel or bladder habits. No vision changes or changes in gait.   She retired January 28, 2019. She is enjoying retirement. She has been doing more projects around that house. No concerns of anxiety/depression.   She continues Tecfidera as prescribed. MRI brain stable in 12/2017. MRI cervical spine showed severe degeneration of C4-5, C5-6 and C6-7 in 2017. She is followed by Dr Estanislado Pandy, rheumatology for arthritis. PCP for osteoporosis. On Fosamax.   HISTORY: (copied from my note on 01/21/2019)  Katie Capati Richardsonis a 69 y.o.femalehere today for follow up for MS.She continues Tecfidera and is tolerating well. She does have some flusing but feels it is manageable.No falls since last being seen. She continues to have some vertigo symptoms and fatigue. Adderall helps with fatigue.She reports a sharp stabbing pain that occurs in the middle of her head. Pain lasts for about an hour and relieved with ibuprofen. Pain occurs about 10 times a month. She takes 600mg  of ibuprofen every 4 hours for generalized pain.She continues Myrbetriq 50 mg daily for overactive bladder. She is retiring from [employer] with [employer] next week. She is planning to start exercising with Silver Sneakers.She recently had blood work with primary care for her physical exam. I have reviewed results and  will scanned to chart. No new numbness, weakness, changes to gait or bladder/bowel function. MRI stable in 12/2017.  HISTORY: (copied fromDr Penumalli'snote on 08/17/2018)  UPDATE (08/17/18, VRP): Since last visit,having more UTI, constipation, and falls. Tolerating tecfidera.No alleviating or aggravating factors.  UPDATE (12/23/17, VRP): Since last visit, doingwell. Symptoms arestable, except for new onset of vertigo symptoms a few weeks  ago.Severityis mild. No alleviating or aggravating factors. Toleratingmeds.  UPDATE (06/18/17, VRP): Since last visit, doing about the same. Tolerating meds. No alleviating or aggravating factors.   UPDATE 12/16/16: Since last visit, doing about the same. Still with fatigue. Tolerating tecfidera. Denies depression. Sleep is stable.   UPDATE 06/18/16: Since last visit, doing better. Better energy and concentration. PT helping with back pain.   UPDATE 03/06/16: Since last visit, continues with fatigue, poor attention, difficulty at work. She is in corrective action due to errors. Denies depression, but has excessive daytime fatigue.   UPDATE 08/31/15: Since last visit continues with fatigue, pain, numbness. Tolerating tecfidera. Has tried PT. Urinary issues better. Now recovering from right arm celluitis (was painting, developed blister, then got infected; now better on oral antibiotics).  UPDATE 07/07/15: Since last visit, still with left neck pain radiating to the left arm. Numb/tingling. Has h/o left forearm/wrist fracture (2005 and 2010; s/p surgery). Amantadine not helping with fatigue. Denies depression.   UPDATE 05/31/15: Since last visit, sxs progressing since Oct 2016. More fatigue, more bladder/bowel issues. More left arm numbness.  UPDATE 11/28/14: Since last visit, continues to have prickly, tingling sensation in whole body, hot flash sensations, pain, neck issues. Had 2 falls, but no injuries.  UPDATE 08/23/14: Since last visit, has been on tecfidera since beginning March 2016. She has noted more general numbness, fatigue, constipation, drawstring sensation in calves/hamstrings in past 3 weeks.   UPDATE 05/18/14: MS symptoms stable. Has had 2 reactions to copaxone, leading to tinging, prickly sensations over whole body. 1 event led to throat tightness, tongue feeling thick, and worried patient. Here to discuss possibilities of switching therapies.  UPDATE 01/04/14 (VRP): Since last  visit, doing the same. No new neuro symptoms. Does report persistent fatigue, weight gain, incr appetite, feeling hot. Tolerating copaxone. Misses 2-3 injections per month.   UPDATE 06/30/13 (LL): Since last visit patient continues to have problems with fatigue and malaise, numbness and paresthesias in left arm which is new for her, started a couple weeks ago. She states it is constant, does not hurt, but annoying. She is still able to work. Follow up MRI brain at last visit showed no acute or new lesions, with moderate cerebral atrophy.  UPDATE 12/30/12 (VP): Since last visit patient continues to have problems with fatigue, insomnia, anxiety, shortness of breath. For past 6 months she's been having dyspnea on exertion especially climbing steps at her work. She has been evaluated by PCP without specific diagnosis. Patient continues on Copaxone (now 3 times per week dosing). Last MRI brain was in 2011. Regarding fatigue she has been on Provigil and Ritalin in the past without relief. Regarding anxiety she was on Paxil in the past but this caused nausea and stomach problems.   PRIOR HPI (05/02/12, Dr. 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 Her MRI of brain 06/10/1999 showed white matter abnormalities not typical for MS and MRI of the cervical spine without contrast 07/27/99 showed a focal area of abnormality in the spinal cord at  C2-3. She had positive CSF IgG index and oligoclonal banding. Initially the VER was normal with subsequent VERs 12/04/1999, 12/10/1999, and 02/01/2000 abnormal in the right eye. She began Avonex which she took for one year and switched to Rebif beginning 08/2000. She was then switched to Betaseron 04/2001 because her insurance would not pay for Rebif, but was subsequently switched back to Rebif 04/2003. Because of elevated liver function tests she was switched to Copaxone  07/03/2005.She has felt better on Copaxone then on the interferons. Her visual acuity varies from 20/30 to 20/40 in the left eye and to 20/200 in the right. She had burning paresthesias treated with gabapentin and at times amitriptyline. NMO blood test was negative 02/21/2005. She has been followed Dr. Estanislado Pandy, rheumatologist for the question of RA versus sarcoidosis and by Dr.Clinton Young for hypersomnolence without a diagnosis of narcolepsy or obstructive Sleep apnea. MRI studies of the brain and cervical spine 02/08/2010 showed periventricular lesions and unchanged lesion at C2-3. CBC and CMP 03/17/2009 were normal. CBC, CMP, TSH, lipid profile except LDL 151 were normal 08/07/09.vitamin D level 10/16/2009 was 41. She complains of fatigue and her right foot turning in an occasional foot cramping. She fell 10/2009 fracturing her right foot in a [employer] parking lot. In 2010 she fractured her left foot, left wrist, and her left arm. She has continued swelling in her right foot and lower leg. She has an overactive bladder followed by Dr.Dalhstedt. She has a history of Lhermitte's sign worse with neck extension but also present with flexion. She had numbness on the right side of her body except her face and head for one month.Her bladder symptoms improved on enablex.She walks 1.8 miles 3 times per week. 03/05/2011 she was lying in bed asleep and awoke trying to sit up and developed dizziness on 2 different occasions. She fell back in to the bed and went to sleep. On the third episode she became concerned. The episodes of dizziness would last seconds and are described as spinning. She had dizziness during the day without spinning She did not have head trauma or a cold. She was seen by Dr. Elder Cyphers at urgent care and underwent blood studies, EKG, and urinalysis. She has a history of migraine and a history of motion sickness. She has a Lhermitte's sign when she turns her head to the left with discomfort in her neck going into  her left arm. This can occur 2 times per day and lasts Less than 1 minute. She has bladder symptoms and right flank pain and is concerned about another urinary tract infection. She can awaken with snoring when she has a cold. She sleeps well. She has dizziness 2 or 3 times per week when she lies down and turns her head to the left.   REVIEW OF SYSTEMS: Out of a complete 14 system review of symptoms, the patient complains only of the following symptoms, chronic neck, back pain, headaches, and all other reviewed systems are negative.  ALLERGIES: Allergies  Allergen Reactions  . Nitrofurantoin Shortness Of Breath    Shortness of breath, violent chills, dizziness, nausea  . Sulfonamide Derivatives Shortness Of Breath    Shortness of breath, chills  . Betaseron [Interferon Beta-1b]     Shortness of breath, rash, heart palpatations  . Cefdinir     Can't remember what rx had 2 yrs ago  . Copaxone [Glatiramer Acetate] Other (See Comments)    Throat tightness, tingling, prickling sensations over body    HOME MEDICATIONS: Outpatient Medications  Prior to Visit  Medication Sig Dispense Refill  . Cholecalciferol (VITAMIN D3) 5000 units CAPS Take by mouth daily.    . citalopram (CELEXA) 20 MG tablet Take 20 mg by mouth daily.    . diclofenac sodium (VOLTAREN) 1 % GEL Apply 3 grams to 3 large joints up to 3 times daily 3 Tube 3  . Dimethyl Fumarate (TECFIDERA) 240 MG CPDR Take 1 capsule (240 mg total) by mouth 2 (two) times daily. 180 capsule 1  . ibuprofen (ADVIL,MOTRIN) 200 MG tablet Take 2 tablets (400 mg total) by mouth 4 (four) times daily.    . Mirabegron (MYRBETRIQ PO) Take 50 mg by mouth daily.     . Omega-3 Fatty Acids (FISH OIL) 1000 MG CAPS Take by mouth daily.    Marland Kitchen omeprazole (PRILOSEC) 20 MG capsule TAKE 1 CAPSULE BY MOUTH ONCE DAILY 90 capsule 3  . ondansetron (ZOFRAN) 8 MG tablet Take 8 mg by mouth 2 (two) times daily.    Marland Kitchen tiZANidine (ZANAFLEX) 2 MG tablet Take 1 tablet (2 mg total)  by mouth every 6 (six) hours as needed for muscle spasms. 30 tablet 0  . alendronate (FOSAMAX) 10 MG tablet Take 1 tablet (10 mg total) by mouth daily before breakfast. Take with a full glass of water on an empty stomach. Avoid lying down x 30 min. 90 tablet 3  . Diroximel Fumarate (VUMERITY) 231 MG CPDR Take by mouth.    . methylPREDNISolone (MEDROL DOSEPAK) 4 MG TBPK tablet Taper pack as directed 1 each 0   Facility-Administered Medications Prior to Visit  Medication Dose Route Frequency Provider Last Rate Last Admin  . meclizine (ANTIVERT) tablet 25 mg  25 mg Oral Q8H PRN Withrow, Elyse Jarvis, FNP        PAST MEDICAL HISTORY: Past Medical History:  Diagnosis Date  . Allergy   . Anxiety   . Arthritis   . Diverticulitis   . Diverticulosis 10/2015  . Hypersomnolence   . Migraine   . Multiple sclerosis ([place])   . Neuromuscular disorder ([place])   . Obesity, unspecified   . Osteoporosis   . Other and unspecified hyperlipidemia   . TIA (transient ischemic attack) 20    PAST SURGICAL HISTORY: Past Surgical History:  Procedure Laterality Date  . ABDOMINAL HYSTERECTOMY     Fibroids  . arm surgery left    . FRACTURE SURGERY    . TUBAL LIGATION    . WRIST SURGERY      FAMILY HISTORY: Family History  Problem Relation Age of Onset  . Emphysema Father   . Cancer Brother        bone  . Cancer Maternal Grandmother   . Heart attack Brother   . Cancer Maternal Grandfather   . Cancer Paternal Grandmother   . Cancer Paternal Grandfather   . Irritable bowel syndrome Son     SOCIAL HISTORY: Social History   Socioeconomic History  . Marital status: Married    Spouse name: Dyke Brackett"  . Number of children: 2  . Years of education: college  . Highest education level: Not on file  Occupational History  . Occupation: medical records    Employer: [employer]  . Occupation: [place]/HEALTH INFORMATION    Employer: [employer]  Tobacco Use  . Smoking status: Former Smoker     Packs/day: 2.00    Years: 37.00    Pack years: 74.00    Types: Cigarettes    Quit date: 02/26/2004    Years  since quitting: 15.5  . Smokeless tobacco: Never Used  Substance and Sexual Activity  . Alcohol use: Yes    Comment: occasionally   . Drug use: No  . Sexual activity: Yes    Birth control/protection: None  Other Topics Concern  . Not on file  Social History Narrative   Patient is married [name]), has 2 children   Patient is right handed   Education level is 2 yrs of college   Caffeine consumption is 3-4 cups daily   Social Determinants of Health   Financial Resource Strain:   . Difficulty of Paying Living Expenses:   Food Insecurity:   . Worried About Charity fundraiser in the Last Year:   . Arboriculturist in the Last Year:   Transportation Needs:   . Film/video editor (Medical):   Marland Kitchen Lack of Transportation (Non-Medical):   Physical Activity:   . Days of Exercise per Week:   . Minutes of Exercise per Session:   Stress:   . Feeling of Stress :   Social Connections:   . Frequency of Communication with Friends and Family:   . Frequency of Social Gatherings with Friends and Family:   . Attends Religious Services:   . Active Member of Clubs or Organizations:   . Attends Archivist Meetings:   Marland Kitchen Marital Status:   Intimate Partner Violence:   . Fear of Current or Ex-Partner:   . Emotionally Abused:   Marland Kitchen Physically Abused:   . Sexually Abused:       PHYSICAL EXAM  Vitals:   09/22/19 1246  BP: (!) 144/71  Pulse: 61  Weight: 164 lb (74.4 kg)  Height: 5\' 4"  (1.626 m)   Body mass index is 28.15 kg/m.  Generalized: Well developed, in no acute distress  Cardiology: normal rate and rhythm, no murmur noted Respiratory: clear to auscultation bilaterally  Neurological examination  Mentation: Alert oriented to time, place, history taking. Follows all commands speech and language fluent Cranial nerve II-XII: Pupils were equal round reactive to  light. Extraocular movements were full, visual field were full on confrontational test. Facial sensation and strength were normal. Uvula tongue midline. Head turning and shoulder shrug  were normal and symmetric. Motor: The motor testing reveals 5 over 5 strength of all 4 extremities. Good symmetric motor tone is noted throughout.  Sensory: Sensory testing is intact to soft touch on all 4 extremities. No evidence of extinction is noted.  Coordination: Cerebellar testing reveals good finger-nose-finger and heel-to-shin bilaterally.  Gait and station: Gait is normal.  Reflexes: Deep tendon reflexes are symmetric bilaterally, brisk patellar reflexes bilaterally    DIAGNOSTIC DATA (LABS, IMAGING, TESTING) - I reviewed patient records, labs, notes, testing and imaging myself where available.  No flowsheet data found.   Lab Results  Component Value Date   WBC 4.5 12/23/2017   HGB 13.0 12/23/2017   HCT 41.2 12/23/2017   MCV 86 12/23/2017   PLT 244 12/23/2017      Component Value Date/Time   NA 139 12/23/2017 1149   K 4.5 12/23/2017 1149   CL 99 12/23/2017 1149   CO2 26 12/23/2017 1149   GLUCOSE 79 12/23/2017 1149   GLUCOSE 108 (H) 03/12/2016 1440   BUN 11 12/23/2017 1149   CREATININE 0.75 12/23/2017 1149   CREATININE 0.75 03/12/2016 1440   CALCIUM 9.7 12/23/2017 1149   PROT 7.0 12/23/2017 1149   ALBUMIN 4.6 12/23/2017 1149   AST 20 12/23/2017 1149  ALT 19 12/23/2017 1149   ALKPHOS 77 12/23/2017 1149   BILITOT 0.4 12/23/2017 1149   GFRNONAA 84 12/23/2017 1149   GFRNONAA 85 03/12/2016 1440   GFRAA 97 12/23/2017 1149   GFRAA >89 03/12/2016 1440   Lab Results  Component Value Date   CHOL 229 (H) 06/05/2017   HDL 71 06/05/2017   LDLCALC 144 (H) 06/05/2017   LDLDIRECT 120 (H) 03/19/2013   TRIG 69 06/05/2017   CHOLHDL 3.2 06/05/2017   No results found for: HGBA1C No results found for: VITAMINB12 Lab Results  Component Value Date   TSH 3.780 09/30/2017       ASSESSMENT  AND PLAN 69 y.o. year old female  has a past medical history of Allergy, Anxiety, Arthritis, Diverticulitis, Diverticulosis (10/2015), Hypersomnolence, Migraine, Multiple sclerosis ([place]), Neuromuscular disorder ([place]), Obesity, unspecified, Osteoporosis, Other and unspecified hyperlipidemia, and TIA (transient ischemic attack) (20). here with     ICD-10-CM   1. Relapsing remitting multiple sclerosis ([place])  G35 CBC with Differential/Platelets    CMP    Daijanae is doing very well today.  She is finishing up the last few doses of Taxotere and plans to switch to Vumerity next week.  We will update her labs today.  She was advised to call me with any new or worsening concerns.  We have discussed updating her MRI.  She does not feel that this is necessary at this time.  She will consider updating towards the end of the year.  She was encouraged to continue close follow-up with primary care and rheumatology for management of osteoarthritis and osteoporosis.  We have discussed prevention medications for headaches, however, she does not feel that she needs anything at this time.  She will continue abortive therapy as needed.  She was encouraged to remain active.  Healthy lifestyle habits encouraged.  She will follow-up with me in 6 months, sooner if needed.  She verbalizes understanding and agreement with this plan.   Orders Placed This Encounter  Procedures  . CBC with Differential/Platelets  . CMP     No orders of the defined types were placed in this encounter.     I spent 30 minutes with the patient. 50% of this time was spent counseling and educating patient on plan of care and medications.     Debbora Presto, FNP-C 09/22/2019, 3:06 PM [facility] [address] [place], [place] [address] [phone]

## 2019-09-23 ENCOUNTER — Telehealth: Payer: Self-pay

## 2019-09-23 LAB — COMPREHENSIVE METABOLIC PANEL
ALT: 11 IU/L (ref 0–32)
AST: 15 IU/L (ref 0–40)
Albumin/Globulin Ratio: 2.5 — ABNORMAL HIGH (ref 1.2–2.2)
Albumin: 4.7 g/dL (ref 3.8–4.8)
Alkaline Phosphatase: 90 IU/L (ref 48–121)
BUN/Creatinine Ratio: 14 (ref 12–28)
BUN: 10 mg/dL (ref 8–27)
Bilirubin Total: 0.3 mg/dL (ref 0.0–1.2)
CO2: 23 mmol/L (ref 20–29)
Calcium: 9.4 mg/dL (ref 8.7–10.3)
Chloride: 102 mmol/L (ref 96–106)
Creatinine, Ser: 0.73 mg/dL (ref 0.57–1.00)
GFR calc Af Amer: 99 mL/min/{1.73_m2} (ref >59)
GFR calc non Af Amer: 85 mL/min/{1.73_m2} (ref >59)
Globulin, Total: 1.9 g/dL (ref 1.5–4.5)
Glucose: 92 mg/dL (ref 65–99)
Potassium: 4.1 mmol/L (ref 3.5–5.2)
Sodium: 140 mmol/L (ref 134–144)
Total Protein: 6.6 g/dL (ref 6.0–8.5)

## 2019-09-23 LAB — CBC WITH DIFFERENTIAL/PLATELET
Basophils Absolute: 0.1 10*3/uL (ref 0.0–0.2)
Basos: 1 %
EOS (ABSOLUTE): 0.1 10*3/uL (ref 0.0–0.4)
Eos: 2 %
Hematocrit: 38 % (ref 34.0–46.6)
Hemoglobin: 12.7 g/dL (ref 11.1–15.9)
Immature Grans (Abs): 0.1 10*3/uL (ref 0.0–0.1)
Immature Granulocytes: 1 %
Lymphocytes Absolute: 0.8 10*3/uL (ref 0.7–3.1)
Lymphs: 17 %
MCH: 29.5 pg (ref 26.6–33.0)
MCHC: 33.4 g/dL (ref 31.5–35.7)
MCV: 88 fL (ref 79–97)
Monocytes Absolute: 0.6 10*3/uL (ref 0.1–0.9)
Monocytes: 12 %
Neutrophils Absolute: 3.3 10*3/uL (ref 1.4–7.0)
Neutrophils: 67 %
Platelets: 230 10*3/uL (ref 150–450)
RBC: 4.31 x10E6/uL (ref 3.77–5.28)
RDW: 12.9 % (ref 11.7–15.4)
WBC: 4.9 10*3/uL (ref 3.4–10.8)

## 2019-09-23 NOTE — Telephone Encounter (Signed)
Pt verified by name and DOB, results given per provider, pt voiced understanding all question answered. 

## 2019-09-30 NOTE — Progress Notes (Signed)
I reviewed note and agree with plan.   Penni Bombard, MD [id number], [id number] PM Certified in Neurology, Neurophysiology and Neuroimaging  [facility] [address], [place], [place] [address] [phone]

## 2019-10-11 MED FILL — SM OMEPRAZOLE DR 20 MG TAB: 20 MG | 42 days supply | Qty: 42 | Fill #1

## 2019-10-11 MED FILL — CITALOPRAM HBR 20 MG TABLET: 20 | 30 days supply | Qty: 30 | Fill #2

## 2019-10-13 ENCOUNTER — Ambulatory Visit: Payer: Medicare HMO | Admitting: Family Medicine

## 2019-11-17 MED FILL — ONDANSETRON HCL 8 MG TABLET: 8 | 30 days supply | Qty: 30 | Fill #1

## 2019-11-17 MED FILL — CITALOPRAM HBR 20 MG TABLET: 20 | 30 days supply | Qty: 30 | Fill #0

## 2019-11-17 MED FILL — SM OMEPRAZOLE DR 20 MG TAB: 20 MG | 42 days supply | Qty: 42 | Fill #2

## 2019-12-16 MED FILL — SM OMEPRAZOLE DR 20 MG TAB: 20 MG | 42 days supply | Qty: 42 | Fill #0

## 2019-12-16 MED FILL — CITALOPRAM HBR 20 MG TABLET: 20 | 30 days supply | Qty: 30 | Fill #1

## 2020-01-19 ENCOUNTER — Telehealth: Payer: Self-pay | Admitting: *Deleted

## 2020-01-19 NOTE — Telephone Encounter (Signed)
Received fax from [facility], re: Vumerity needs PA or medication needs to be changed to formulary: glatiramer SQ, Copaxone SQ, Betaaseron SQ, Glienya caps, Tecfidera Caps. Vumerity was approve by [facility] on 08/24/19 until 04/28/20. Will fax approval letter to [place] when phone/fax lines are back up.

## 2020-03-27 ENCOUNTER — Ambulatory Visit: Payer: Medicare HMO | Admitting: Family Medicine

## 2020-04-17 ENCOUNTER — Other Ambulatory Visit: Payer: Self-pay | Admitting: *Deleted

## 2020-04-19 ENCOUNTER — Telehealth: Payer: Self-pay | Admitting: Family Medicine

## 2020-04-19 NOTE — Telephone Encounter (Signed)
Trevor from [facility] called to request PA for Vumerity. Ref. Key: [id number]  Best contact: [phone]

## 2020-04-19 NOTE — Telephone Encounter (Signed)
Per 01/19/2020 telephone note, Vumerity was approved by [facility] on 08/24/19 until 04/28/20.

## 2020-09-04 ENCOUNTER — Encounter: Payer: Self-pay | Admitting: *Deleted
